# Patient Record
Sex: Female | Born: 1935 | Race: White | Hispanic: No | Smoking: Former smoker
Health system: Southern US, Community
[De-identification: ages and names within clinical notes are randomized; demographics above are authoritative.]

## PROBLEM LIST (undated history)

## (undated) DIAGNOSIS — M159 Polyosteoarthritis, unspecified: Secondary | ICD-10-CM

## (undated) DIAGNOSIS — Z85828 Personal history of other malignant neoplasm of skin: Secondary | ICD-10-CM

## (undated) DIAGNOSIS — I5189 Other ill-defined heart diseases: Secondary | ICD-10-CM

## (undated) DIAGNOSIS — M1990 Unspecified osteoarthritis, unspecified site: Secondary | ICD-10-CM

## (undated) DIAGNOSIS — N3941 Urge incontinence: Secondary | ICD-10-CM

## (undated) DIAGNOSIS — M810 Age-related osteoporosis without current pathological fracture: Secondary | ICD-10-CM

## (undated) DIAGNOSIS — IMO0001 Reserved for inherently not codable concepts without codable children: Secondary | ICD-10-CM

## (undated) DIAGNOSIS — E785 Hyperlipidemia, unspecified: Secondary | ICD-10-CM

## (undated) DIAGNOSIS — N3281 Overactive bladder: Secondary | ICD-10-CM

## (undated) DIAGNOSIS — Z889 Allergy status to unspecified drugs, medicaments and biological substances status: Secondary | ICD-10-CM

## (undated) DIAGNOSIS — J301 Allergic rhinitis due to pollen: Secondary | ICD-10-CM

## (undated) DIAGNOSIS — N898 Other specified noninflammatory disorders of vagina: Secondary | ICD-10-CM

## (undated) HISTORY — DX: Reserved for inherently not codable concepts without codable children: IMO0001

## (undated) HISTORY — DX: Age-related osteoporosis without current pathological fracture: M81.0

## (undated) HISTORY — DX: Other ill-defined heart diseases: I51.89

## (undated) HISTORY — DX: Allergic rhinitis due to pollen: J30.1

## (undated) HISTORY — DX: Hyperlipidemia, unspecified: E78.5

## (undated) HISTORY — DX: Other specified noninflammatory disorders of vagina: N89.8

## (undated) HISTORY — DX: Polyosteoarthritis, unspecified: M15.9

## (undated) HISTORY — DX: Urge incontinence: N39.41

## (undated) HISTORY — PX: TONSILLECTOMY AND ADENOIDECTOMY: SUR1326

## (undated) HISTORY — PX: DILATION AND CURETTAGE OF UTERUS: SHX78

---
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
## 1988-08-03 DIAGNOSIS — Z85828 Personal history of other malignant neoplasm of skin: Secondary | ICD-10-CM

## 1988-08-03 HISTORY — DX: Personal history of other malignant neoplasm of skin: Z85.828

## 1998-08-03 DIAGNOSIS — Z85828 Personal history of other malignant neoplasm of skin: Secondary | ICD-10-CM

## 1998-08-03 HISTORY — DX: Personal history of other malignant neoplasm of skin: Z85.828

## 2005-08-03 HISTORY — PX: CYSTOCELE REPAIR: SHX163

## 2006-02-04 HISTORY — PX: UMBILICAL HERNIA REPAIR: SHX196

## 2010-08-03 HISTORY — PX: INGUINAL HERNIA REPAIR: SUR1180

## 2010-12-02 DIAGNOSIS — IMO0001 Reserved for inherently not codable concepts without codable children: Secondary | ICD-10-CM

## 2010-12-02 HISTORY — DX: Reserved for inherently not codable concepts without codable children: IMO0001

## 2010-12-03 LAB — HM DEXA SCAN

## 2011-08-28 DIAGNOSIS — J209 Acute bronchitis, unspecified: Secondary | ICD-10-CM | POA: Diagnosis not present

## 2011-09-08 DIAGNOSIS — L570 Actinic keratosis: Secondary | ICD-10-CM | POA: Diagnosis not present

## 2011-09-08 DIAGNOSIS — Z85828 Personal history of other malignant neoplasm of skin: Secondary | ICD-10-CM | POA: Diagnosis not present

## 2011-10-06 DIAGNOSIS — L570 Actinic keratosis: Secondary | ICD-10-CM | POA: Diagnosis not present

## 2011-10-06 DIAGNOSIS — Z85828 Personal history of other malignant neoplasm of skin: Secondary | ICD-10-CM | POA: Diagnosis not present

## 2011-11-27 DIAGNOSIS — H1045 Other chronic allergic conjunctivitis: Secondary | ICD-10-CM | POA: Diagnosis not present

## 2011-11-27 DIAGNOSIS — R030 Elevated blood-pressure reading, without diagnosis of hypertension: Secondary | ICD-10-CM | POA: Diagnosis not present

## 2011-12-01 DIAGNOSIS — Z961 Presence of intraocular lens: Secondary | ICD-10-CM | POA: Diagnosis not present

## 2011-12-08 DIAGNOSIS — R1013 Epigastric pain: Secondary | ICD-10-CM | POA: Diagnosis not present

## 2011-12-08 DIAGNOSIS — K439 Ventral hernia without obstruction or gangrene: Secondary | ICD-10-CM | POA: Diagnosis not present

## 2012-01-05 DIAGNOSIS — D485 Neoplasm of uncertain behavior of skin: Secondary | ICD-10-CM | POA: Diagnosis not present

## 2012-02-08 DIAGNOSIS — E038 Other specified hypothyroidism: Secondary | ICD-10-CM | POA: Diagnosis not present

## 2012-02-08 DIAGNOSIS — E7800 Pure hypercholesterolemia, unspecified: Secondary | ICD-10-CM | POA: Diagnosis not present

## 2012-02-08 DIAGNOSIS — Z79899 Other long term (current) drug therapy: Secondary | ICD-10-CM | POA: Diagnosis not present

## 2012-02-08 DIAGNOSIS — E559 Vitamin D deficiency, unspecified: Secondary | ICD-10-CM | POA: Diagnosis not present

## 2012-02-15 DIAGNOSIS — Z01419 Encounter for gynecological examination (general) (routine) without abnormal findings: Secondary | ICD-10-CM | POA: Diagnosis not present

## 2012-02-15 DIAGNOSIS — E7800 Pure hypercholesterolemia, unspecified: Secondary | ICD-10-CM | POA: Diagnosis not present

## 2012-02-15 DIAGNOSIS — Z23 Encounter for immunization: Secondary | ICD-10-CM | POA: Diagnosis not present

## 2012-02-15 DIAGNOSIS — Z0000 Encounter for general adult medical examination without abnormal findings: Secondary | ICD-10-CM | POA: Diagnosis not present

## 2012-05-31 DIAGNOSIS — Z23 Encounter for immunization: Secondary | ICD-10-CM | POA: Diagnosis not present

## 2012-07-18 ENCOUNTER — Ambulatory Visit (INDEPENDENT_AMBULATORY_CARE_PROVIDER_SITE_OTHER): Payer: Medicare Other | Admitting: Internal Medicine

## 2012-07-18 ENCOUNTER — Encounter: Payer: Self-pay | Admitting: Internal Medicine

## 2012-07-18 VITALS — BP 142/70 | HR 86 | Temp 98.2°F | Ht 63.5 in | Wt 131.0 lb

## 2012-07-18 DIAGNOSIS — J301 Allergic rhinitis due to pollen: Secondary | ICD-10-CM | POA: Diagnosis not present

## 2012-07-18 DIAGNOSIS — E785 Hyperlipidemia, unspecified: Secondary | ICD-10-CM

## 2012-07-18 DIAGNOSIS — M159 Polyosteoarthritis, unspecified: Secondary | ICD-10-CM | POA: Insufficient documentation

## 2012-07-18 DIAGNOSIS — M810 Age-related osteoporosis without current pathological fracture: Secondary | ICD-10-CM | POA: Diagnosis not present

## 2012-07-18 DIAGNOSIS — N3941 Urge incontinence: Secondary | ICD-10-CM

## 2012-07-18 DIAGNOSIS — I5032 Chronic diastolic (congestive) heart failure: Secondary | ICD-10-CM | POA: Insufficient documentation

## 2012-07-18 NOTE — Assessment & Plan Note (Signed)
Discussed primary prevention No history of any vascular disease Will stop

## 2012-07-18 NOTE — Assessment & Plan Note (Signed)
She has been satisfied with celebrex No stomach issues Kidney tests normal earlier this year---records reviewed

## 2012-07-18 NOTE — Assessment & Plan Note (Signed)
Still with regular symptoms Advised against the benedryl she was using Asked her to use zyrtec and we can add more rx if needed

## 2012-07-18 NOTE — Assessment & Plan Note (Signed)
Not sure if detrol is helping Advised that she can try off it, and restart if clear change

## 2012-07-18 NOTE — Progress Notes (Signed)
Subjective:    Patient ID: [name] [name], female    DOB: 06/03/1924, 78 y.o.   MRN: [id number]  HPI Recently moved to [facility] Here with husband---I have been caring for him already  Long standing history of high cholesterol LDL about 97 last check on the med No history of CAD or stroke  Has urge incontinence and leakage Has to wear pads Did seem to help in the beginning No dry mouth, dizziness or memory problems  Arthritis fairly widespread Hands are the worst Occ will drop things and there is swelling celebrex seems to help that  Bone density showed osteoporosis in spine--not hip Had used for 5-10 years and then stopped for a couple of years Restarted last year  Has known LBBB  Current Outpatient Prescriptions on File Prior to Visit  Medication Sig Dispense Refill  . pravastatin (PRAVACHOL) 40 MG tablet Take 40 mg by mouth daily.        Allergies  Allergen Reactions  . Demerol (Meperidine)   . Shellfish Allergy     Past Medical History  Diagnosis Date  . Allergic rhinitis due to pollen     some shellfish also  . Osteoporosis   . Osteoarthritis, multiple sites   . Urge incontinence   . Hyperlipidemia     Past Surgical History  Procedure Date  . Inguinal hernia repair 2012    left side with mesh  . Umbilical hernia repair 2007  . Cystocele repair 2007    and rectocele  . Tonsillectomy and adenoidectomy     as child    Family History  Problem Relation Age of Onset  . Cancer Father 34    colon cancer  . Heart disease Father     History   Social History  . Marital Status: Married    Spouse Name: N/A    Number of Children: 3  . Years of Education: N/A   Occupational History  . 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 Smoking status: Never Smoker   . Smokeless tobacco: Never Used  . Alcohol Use: No  . Drug Use: No  . Sexually Active: Not on file   Other Topics Concern  . Not  on file   Social History Narrative   Has living willHusband then daughter Leta Jungling, is health care POAWould accept trial of resuscitationWould probably accept feeding tube   Review of Systems  Constitutional: Positive for unexpected weight change. Negative for fatigue.       Lost some weight with the move here and stress with husband's health Wears seat belt  HENT: Positive for hearing loss, congestion and rhinorrhea.        Just got new hearing aides---seem to be helping Own teeth---regular with dentist  Allergy problems---shots in past Uses OTC antihistamine just prn  Eyes: Negative for visual disturbance.       No diplopia or unilateral vision loss  Respiratory: Negative for cough and shortness of breath.   Cardiovascular: Negative for chest pain, palpitations and leg swelling.  Gastrointestinal: Negative for nausea, vomiting, constipation and blood in stool.       No heartburn  Genitourinary: Positive for difficulty urinating.       Urge incontinence  Musculoskeletal: Positive for arthralgias. Negative for joint swelling.  Skin: Negative for rash.       No suspicious lesions  Neurological: Negative for dizziness, syncope and light-headedness.  Occ headaches---relates to sinuses  Hematological: Negative for adenopathy. Does not bruise/bleed easily.  Psychiatric/Behavioral: Positive for sleep disturbance. Negative for dysphoric mood. The patient is not nervous/anxious.        Sleep is off some--nighttime awakening. 6-7 hours per night No sig daytime somnolence       Objective:   Physical Exam  Constitutional: She is oriented to person, place, and time. She appears well-developed and well-nourished. No distress.  HENT:  Mouth/Throat: Oropharynx is clear and moist. No oropharyngeal exudate.  Eyes: Conjunctivae normal and EOM are normal. Pupils are equal, round, and reactive to light.  Neck: Normal range of motion. Neck supple. No thyromegaly present.  Cardiovascular:  Normal rate, regular rhythm, normal heart sounds and intact distal pulses.  Exam reveals no gallop.   No murmur heard. Pulmonary/Chest: Effort normal and breath sounds normal. No respiratory distress. She has no wheezes. She has no rales.  Abdominal: Soft. There is no tenderness.  Musculoskeletal: She exhibits no edema and no tenderness.  Lymphadenopathy:    She has no cervical adenopathy.  Neurological: She is alert and oriented to person, place, and time.  Skin: No rash noted. No erythema.  Psychiatric: She has a normal mood and affect. Her behavior is normal.          Assessment & Plan:

## 2012-07-18 NOTE — Patient Instructions (Addendum)
Please stop the pravastatin. Try off the detrol. If your urine leakage gets worse, it is okay to go back on

## 2012-07-18 NOTE — Progress Notes (Signed)
  Subjective:    Patient ID: [name] [name], female    DOB: 06/03/1924, 78 y.o.   MRN: [id number]  HPI    Review of Systems     Objective:   Physical Exam  Cardiovascular:  Murmur heard.      Very soft systolic murmur in aortic area          Assessment & Plan:

## 2012-07-18 NOTE — Assessment & Plan Note (Signed)
Had long term Rx with fosamax then restarted after 2 year hiatus Will plan to recheck next year Spine T score -3.4 Wrist and hip -1.5 or less  If no worse, will probably stop again Continue the vitamin D  Discussed regular exercise

## 2012-10-13 DIAGNOSIS — H4010X0 Unspecified open-angle glaucoma, stage unspecified: Secondary | ICD-10-CM | POA: Diagnosis not present

## 2012-11-28 ENCOUNTER — Other Ambulatory Visit: Payer: Self-pay | Admitting: *Deleted

## 2012-11-28 ENCOUNTER — Telehealth: Payer: Self-pay

## 2012-11-28 MED ORDER — CELECOXIB 200 MG PO CAPS: 200.0000 mg | ORAL_CAPSULE | Freq: Every day | ORAL | Status: DC

## 2012-11-28 NOTE — Telephone Encounter (Signed)
Pt left v/m returning call for [facility], alendronate is 70 mg that pt request filled.Please advise.

## 2012-11-29 MED ORDER — ALENDRONATE SODIUM 70 MG PO TABS: 70.0000 mg | ORAL_TABLET | ORAL | Status: DC

## 2012-11-29 NOTE — Telephone Encounter (Signed)
rx sent to pharmacy by e-script  

## 2012-12-14 ENCOUNTER — Other Ambulatory Visit: Payer: Self-pay | Admitting: *Deleted

## 2012-12-14 MED ORDER — ALENDRONATE SODIUM 70 MG PO TABS: 70.0000 mg | ORAL_TABLET | ORAL | Status: DC

## 2012-12-14 MED ORDER — CELECOXIB 200 MG PO CAPS: 200.0000 mg | ORAL_CAPSULE | Freq: Every day | ORAL | Status: DC

## 2012-12-14 NOTE — Telephone Encounter (Signed)
[facility], rx sent to pharmacy by [facility]

## 2012-12-15 DIAGNOSIS — H1045 Other chronic allergic conjunctivitis: Secondary | ICD-10-CM | POA: Diagnosis not present

## 2012-12-15 DIAGNOSIS — J309 Allergic rhinitis, unspecified: Secondary | ICD-10-CM | POA: Diagnosis not present

## 2012-12-15 DIAGNOSIS — Z91013 Allergy to seafood: Secondary | ICD-10-CM | POA: Diagnosis not present

## 2012-12-16 DIAGNOSIS — J309 Allergic rhinitis, unspecified: Secondary | ICD-10-CM | POA: Diagnosis not present

## 2013-01-03 DIAGNOSIS — J309 Allergic rhinitis, unspecified: Secondary | ICD-10-CM | POA: Diagnosis not present

## 2013-01-05 DIAGNOSIS — J309 Allergic rhinitis, unspecified: Secondary | ICD-10-CM | POA: Diagnosis not present

## 2013-01-10 DIAGNOSIS — J309 Allergic rhinitis, unspecified: Secondary | ICD-10-CM | POA: Diagnosis not present

## 2013-01-12 DIAGNOSIS — J309 Allergic rhinitis, unspecified: Secondary | ICD-10-CM | POA: Diagnosis not present

## 2013-01-17 DIAGNOSIS — J309 Allergic rhinitis, unspecified: Secondary | ICD-10-CM | POA: Diagnosis not present

## 2013-01-19 DIAGNOSIS — J309 Allergic rhinitis, unspecified: Secondary | ICD-10-CM | POA: Diagnosis not present

## 2013-01-24 DIAGNOSIS — J309 Allergic rhinitis, unspecified: Secondary | ICD-10-CM | POA: Diagnosis not present

## 2013-01-26 DIAGNOSIS — J309 Allergic rhinitis, unspecified: Secondary | ICD-10-CM | POA: Diagnosis not present

## 2013-01-30 ENCOUNTER — Ambulatory Visit: Payer: TRICARE For Life (TFL) | Admitting: Internal Medicine

## 2013-01-31 DIAGNOSIS — J309 Allergic rhinitis, unspecified: Secondary | ICD-10-CM | POA: Diagnosis not present

## 2013-02-02 DIAGNOSIS — J309 Allergic rhinitis, unspecified: Secondary | ICD-10-CM | POA: Diagnosis not present

## 2013-02-06 ENCOUNTER — Encounter: Payer: Self-pay | Admitting: Internal Medicine

## 2013-02-06 ENCOUNTER — Ambulatory Visit (INDEPENDENT_AMBULATORY_CARE_PROVIDER_SITE_OTHER): Payer: Medicare Other | Admitting: Internal Medicine

## 2013-02-06 VITALS — BP 140/70 | HR 67 | Temp 98.4°F | Wt 127.0 lb

## 2013-02-06 DIAGNOSIS — Z23 Encounter for immunization: Secondary | ICD-10-CM

## 2013-02-06 DIAGNOSIS — M159 Polyosteoarthritis, unspecified: Secondary | ICD-10-CM | POA: Diagnosis not present

## 2013-02-06 DIAGNOSIS — M810 Age-related osteoporosis without current pathological fracture: Secondary | ICD-10-CM

## 2013-02-06 DIAGNOSIS — E785 Hyperlipidemia, unspecified: Secondary | ICD-10-CM | POA: Diagnosis not present

## 2013-02-06 DIAGNOSIS — N3941 Urge incontinence: Secondary | ICD-10-CM | POA: Diagnosis not present

## 2013-02-06 DIAGNOSIS — J301 Allergic rhinitis due to pollen: Secondary | ICD-10-CM

## 2013-02-06 LAB — CBC WITH DIFFERENTIAL/PLATELET
Basophils Absolute: 0 10*3/uL (ref 0.0–0.1)
Eosinophils Absolute: 0.2 10*3/uL (ref 0.0–0.7)
HCT: 37.5 % (ref 36.0–46.0)
Hemoglobin: 12.6 g/dL (ref 12.0–15.0)
Lymphocytes Relative: 46.2 % — ABNORMAL HIGH (ref 12.0–46.0)
Lymphs Abs: 4 10*3/uL (ref 0.7–4.0)
MCHC: 33.5 g/dL (ref 30.0–36.0)
Neutro Abs: 3.8 10*3/uL (ref 1.4–7.7)
Platelets: 291 10*3/uL (ref 150.0–400.0)
RDW: 14.2 % (ref 11.5–14.6)

## 2013-02-06 LAB — HEPATIC FUNCTION PANEL
Albumin: 3.8 g/dL (ref 3.5–5.2)
Total Protein: 6.5 g/dL (ref 6.0–8.3)

## 2013-02-06 LAB — BASIC METABOLIC PANEL
BUN: 22 mg/dL (ref 6–23)
CO2: 29 mEq/L (ref 19–32)
Calcium: 8.9 mg/dL (ref 8.4–10.5)
Glucose, Bld: 97 mg/dL (ref 70–99)
Sodium: 135 mEq/L (ref 135–145)

## 2013-02-06 LAB — LIPID PANEL: Total CHOL/HDL Ratio: 3

## 2013-02-06 LAB — TSH: TSH: 1.27 u[IU]/mL (ref 0.35–5.50)

## 2013-02-06 NOTE — Assessment & Plan Note (Signed)
Increased symptoms off the detrol--so back on

## 2013-02-06 NOTE — Assessment & Plan Note (Signed)
Now on immunotherapy with Dr [place] Callas

## 2013-02-06 NOTE — Progress Notes (Signed)
Subjective:    Patient ID: [name] [name], female    DOB: Feb 19, 1924, 79 y.o.   MRN: [id number]  HPI Here for follow up Doing well  Did have fall this [facility]  Tried off the detrol Noticed significant increased incontinence so restarted No dizziness, memory problems on it Slight but not bad dry mouth  Has ventral hernia on left side Relates to pushing husband in wheelchair  Off the statin Feels fine Reviewed our decision about primary prevention  Still on the celebrex Still feels it helps No upset stomach or heartburn  Bad allergies Went to [place] [facility]--now on immunotherapy Dr [place] Callas  Still on the fosamax  Current Outpatient Prescriptions on File Prior to Visit  Medication Sig Dispense Refill  . alendronate (FOSAMAX) 70 MG tablet Take 1 tablet (70 mg total) by mouth every 7 (seven) days. Take with a full glass of water on an empty stomach.  12 tablet  3  . aspirin 81 MG tablet Take 81 mg by mouth daily.      . celecoxib (CELEBREX) 200 MG capsule Take 1 capsule (200 mg total) by mouth daily.  90 capsule  3  . Cholecalciferol (VITAMIN D-3) 1000 UNITS CAPS Take by mouth daily.      . Lactobacillus (ACIDOPHILUS) 100 MG CAPS Take by mouth.      . Multiple Vitamin (MULTIVITAMIN) tablet Take 1 tablet by mouth daily.      Marland Kitchen tolterodine (DETROL LA) 4 MG 24 hr capsule Take 4 mg by mouth daily.       No current facility-administered medications on file prior to visit.    Allergies  Allergen Reactions  . Demerol (Meperidine)   . Shellfish Allergy     Past Medical History  Diagnosis Date  . Allergic rhinitis due to pollen     some shellfish also  . Osteoporosis   . Osteoarthritis, multiple sites   . Urge incontinence   . Hyperlipidemia   . Aortic sclerosis 5/12    On echo. no stenosis  . Diastolic dysfunction     stress echo otherwise normal 1/04. Repeat 5/11 also negative    Past Surgical History  Procedure Laterality  Date  . Inguinal hernia repair  2012    left side with mesh  . Umbilical hernia repair  2007  . Cystocele repair  2007    and rectocele  . Tonsillectomy and adenoidectomy      as child    Family History  Problem Relation Age of Onset  . Cancer Father 83    colon cancer  . Heart disease Father     History   Social History  . Marital Status: Married    Spouse Name: N/A    Number of Children: 3  . Years of Education: N/A   Occupational History  . 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 Smoking status: Never Smoker   . Smokeless tobacco: Never Used  . Alcohol Use: No  . Drug Use: No  . Sexually Active: Not on file   Other Topics Concern  . Not on file   Social History Narrative   Has living will   Husband then daughter Leta Jungling, is health care POA   Would accept trial of resuscitation   Would probably accept feeding tube   Review of Systems Sleeps well Appetite is fine Weight is down  a few pounds     Objective:   Physical Exam  Constitutional: She appears well-developed and well-nourished. No distress.  Neck: Normal range of motion. Neck supple. No thyromegaly present.  Cardiovascular: Normal rate, regular rhythm, normal heart sounds and intact distal pulses.  Exam reveals no gallop.   No murmur heard. Pulmonary/Chest: Effort normal and breath sounds normal. No respiratory distress. She has no wheezes. She has no rales.  Abdominal: Soft. There is no tenderness.  Musculoskeletal: She exhibits no edema and no tenderness.  Lymphadenopathy:    She has no cervical adenopathy.  Skin: No rash noted.  Psychiatric: She has a normal mood and affect. Her behavior is normal.          Assessment & Plan:

## 2013-02-06 NOTE — Addendum Note (Signed)
Addended by: Sueanne Margarita on: 02/06/2013 11:10 AM   Modules accepted: Orders

## 2013-02-06 NOTE — Assessment & Plan Note (Signed)
Satisfied with the celebrex Will check renal function

## 2013-02-06 NOTE — Assessment & Plan Note (Signed)
No Rx for primary prevention Will recheck labs

## 2013-02-06 NOTE — Assessment & Plan Note (Signed)
Check DEXA next year and consider stopping the fosamax again

## 2013-02-07 ENCOUNTER — Encounter: Payer: Self-pay | Admitting: Family Medicine

## 2013-02-07 DIAGNOSIS — J309 Allergic rhinitis, unspecified: Secondary | ICD-10-CM | POA: Diagnosis not present

## 2013-02-09 DIAGNOSIS — J309 Allergic rhinitis, unspecified: Secondary | ICD-10-CM | POA: Diagnosis not present

## 2013-02-14 DIAGNOSIS — J309 Allergic rhinitis, unspecified: Secondary | ICD-10-CM | POA: Diagnosis not present

## 2013-02-16 DIAGNOSIS — J309 Allergic rhinitis, unspecified: Secondary | ICD-10-CM | POA: Diagnosis not present

## 2013-02-21 DIAGNOSIS — J309 Allergic rhinitis, unspecified: Secondary | ICD-10-CM | POA: Diagnosis not present

## 2013-02-23 DIAGNOSIS — J309 Allergic rhinitis, unspecified: Secondary | ICD-10-CM | POA: Diagnosis not present

## 2013-02-28 DIAGNOSIS — J309 Allergic rhinitis, unspecified: Secondary | ICD-10-CM | POA: Diagnosis not present

## 2013-03-02 DIAGNOSIS — J309 Allergic rhinitis, unspecified: Secondary | ICD-10-CM | POA: Diagnosis not present

## 2013-03-06 ENCOUNTER — Other Ambulatory Visit: Payer: Self-pay | Admitting: *Deleted

## 2013-03-06 MED ORDER — TOLTERODINE TARTRATE ER 4 MG PO CP24: 4.0000 mg | ORAL_CAPSULE | Freq: Every day | ORAL | Status: DC

## 2013-03-07 DIAGNOSIS — J309 Allergic rhinitis, unspecified: Secondary | ICD-10-CM | POA: Diagnosis not present

## 2013-03-09 DIAGNOSIS — J309 Allergic rhinitis, unspecified: Secondary | ICD-10-CM | POA: Diagnosis not present

## 2013-03-14 DIAGNOSIS — J309 Allergic rhinitis, unspecified: Secondary | ICD-10-CM | POA: Diagnosis not present

## 2013-03-16 DIAGNOSIS — J309 Allergic rhinitis, unspecified: Secondary | ICD-10-CM | POA: Diagnosis not present

## 2013-03-21 DIAGNOSIS — J309 Allergic rhinitis, unspecified: Secondary | ICD-10-CM | POA: Diagnosis not present

## 2013-03-23 DIAGNOSIS — J309 Allergic rhinitis, unspecified: Secondary | ICD-10-CM | POA: Diagnosis not present

## 2013-03-28 DIAGNOSIS — J309 Allergic rhinitis, unspecified: Secondary | ICD-10-CM | POA: Diagnosis not present

## 2013-03-30 DIAGNOSIS — J309 Allergic rhinitis, unspecified: Secondary | ICD-10-CM | POA: Diagnosis not present

## 2013-04-04 DIAGNOSIS — J309 Allergic rhinitis, unspecified: Secondary | ICD-10-CM | POA: Diagnosis not present

## 2013-04-06 DIAGNOSIS — J309 Allergic rhinitis, unspecified: Secondary | ICD-10-CM | POA: Diagnosis not present

## 2013-04-11 DIAGNOSIS — J309 Allergic rhinitis, unspecified: Secondary | ICD-10-CM | POA: Diagnosis not present

## 2013-04-13 DIAGNOSIS — J309 Allergic rhinitis, unspecified: Secondary | ICD-10-CM | POA: Diagnosis not present

## 2013-04-17 DIAGNOSIS — Z85828 Personal history of other malignant neoplasm of skin: Secondary | ICD-10-CM | POA: Diagnosis not present

## 2013-04-17 DIAGNOSIS — L570 Actinic keratosis: Secondary | ICD-10-CM | POA: Diagnosis not present

## 2013-04-17 DIAGNOSIS — D239 Other benign neoplasm of skin, unspecified: Secondary | ICD-10-CM | POA: Diagnosis not present

## 2013-04-17 DIAGNOSIS — L821 Other seborrheic keratosis: Secondary | ICD-10-CM | POA: Diagnosis not present

## 2013-04-18 DIAGNOSIS — H4010X0 Unspecified open-angle glaucoma, stage unspecified: Secondary | ICD-10-CM | POA: Diagnosis not present

## 2013-04-18 DIAGNOSIS — J309 Allergic rhinitis, unspecified: Secondary | ICD-10-CM | POA: Diagnosis not present

## 2013-04-20 DIAGNOSIS — J309 Allergic rhinitis, unspecified: Secondary | ICD-10-CM | POA: Diagnosis not present

## 2013-04-25 DIAGNOSIS — J309 Allergic rhinitis, unspecified: Secondary | ICD-10-CM | POA: Diagnosis not present

## 2013-04-27 DIAGNOSIS — J309 Allergic rhinitis, unspecified: Secondary | ICD-10-CM | POA: Diagnosis not present

## 2013-05-02 DIAGNOSIS — H4010X0 Unspecified open-angle glaucoma, stage unspecified: Secondary | ICD-10-CM | POA: Diagnosis not present

## 2013-05-02 DIAGNOSIS — J309 Allergic rhinitis, unspecified: Secondary | ICD-10-CM | POA: Diagnosis not present

## 2013-05-09 DIAGNOSIS — J309 Allergic rhinitis, unspecified: Secondary | ICD-10-CM | POA: Diagnosis not present

## 2013-05-16 DIAGNOSIS — J309 Allergic rhinitis, unspecified: Secondary | ICD-10-CM | POA: Diagnosis not present

## 2013-05-16 DIAGNOSIS — H4010X0 Unspecified open-angle glaucoma, stage unspecified: Secondary | ICD-10-CM | POA: Diagnosis not present

## 2013-05-23 DIAGNOSIS — J309 Allergic rhinitis, unspecified: Secondary | ICD-10-CM | POA: Diagnosis not present

## 2013-05-25 ENCOUNTER — Ambulatory Visit (INDEPENDENT_AMBULATORY_CARE_PROVIDER_SITE_OTHER): Payer: Medicare Other

## 2013-05-25 DIAGNOSIS — Z23 Encounter for immunization: Secondary | ICD-10-CM

## 2013-05-30 DIAGNOSIS — J309 Allergic rhinitis, unspecified: Secondary | ICD-10-CM | POA: Diagnosis not present

## 2013-06-06 DIAGNOSIS — J309 Allergic rhinitis, unspecified: Secondary | ICD-10-CM | POA: Diagnosis not present

## 2013-06-08 DIAGNOSIS — J309 Allergic rhinitis, unspecified: Secondary | ICD-10-CM | POA: Diagnosis not present

## 2013-06-13 DIAGNOSIS — J309 Allergic rhinitis, unspecified: Secondary | ICD-10-CM | POA: Diagnosis not present

## 2013-06-20 DIAGNOSIS — J309 Allergic rhinitis, unspecified: Secondary | ICD-10-CM | POA: Diagnosis not present

## 2013-07-18 DIAGNOSIS — J309 Allergic rhinitis, unspecified: Secondary | ICD-10-CM | POA: Diagnosis not present

## 2013-07-25 DIAGNOSIS — J309 Allergic rhinitis, unspecified: Secondary | ICD-10-CM | POA: Diagnosis not present

## 2013-08-01 DIAGNOSIS — J309 Allergic rhinitis, unspecified: Secondary | ICD-10-CM | POA: Diagnosis not present

## 2013-08-08 DIAGNOSIS — J309 Allergic rhinitis, unspecified: Secondary | ICD-10-CM | POA: Diagnosis not present

## 2013-08-15 DIAGNOSIS — J309 Allergic rhinitis, unspecified: Secondary | ICD-10-CM | POA: Diagnosis not present

## 2013-08-22 DIAGNOSIS — J309 Allergic rhinitis, unspecified: Secondary | ICD-10-CM | POA: Diagnosis not present

## 2013-08-29 DIAGNOSIS — J309 Allergic rhinitis, unspecified: Secondary | ICD-10-CM | POA: Diagnosis not present

## 2013-09-05 DIAGNOSIS — J309 Allergic rhinitis, unspecified: Secondary | ICD-10-CM | POA: Diagnosis not present

## 2013-09-12 DIAGNOSIS — J309 Allergic rhinitis, unspecified: Secondary | ICD-10-CM | POA: Diagnosis not present

## 2013-09-19 DIAGNOSIS — J309 Allergic rhinitis, unspecified: Secondary | ICD-10-CM | POA: Diagnosis not present

## 2013-09-26 DIAGNOSIS — J309 Allergic rhinitis, unspecified: Secondary | ICD-10-CM | POA: Diagnosis not present

## 2013-10-03 DIAGNOSIS — J309 Allergic rhinitis, unspecified: Secondary | ICD-10-CM | POA: Diagnosis not present

## 2013-10-10 DIAGNOSIS — J309 Allergic rhinitis, unspecified: Secondary | ICD-10-CM | POA: Diagnosis not present

## 2013-10-13 ENCOUNTER — Other Ambulatory Visit: Payer: Self-pay | Admitting: Internal Medicine

## 2013-10-17 DIAGNOSIS — J309 Allergic rhinitis, unspecified: Secondary | ICD-10-CM | POA: Diagnosis not present

## 2013-10-24 DIAGNOSIS — J309 Allergic rhinitis, unspecified: Secondary | ICD-10-CM | POA: Diagnosis not present

## 2013-10-31 DIAGNOSIS — J309 Allergic rhinitis, unspecified: Secondary | ICD-10-CM | POA: Diagnosis not present

## 2013-11-01 ENCOUNTER — Other Ambulatory Visit: Payer: Self-pay | Admitting: Internal Medicine

## 2013-11-06 DIAGNOSIS — J309 Allergic rhinitis, unspecified: Secondary | ICD-10-CM | POA: Diagnosis not present

## 2013-11-07 DIAGNOSIS — J309 Allergic rhinitis, unspecified: Secondary | ICD-10-CM | POA: Diagnosis not present

## 2013-11-14 DIAGNOSIS — J309 Allergic rhinitis, unspecified: Secondary | ICD-10-CM | POA: Diagnosis not present

## 2013-11-14 DIAGNOSIS — H4010X0 Unspecified open-angle glaucoma, stage unspecified: Secondary | ICD-10-CM | POA: Diagnosis not present

## 2013-11-21 DIAGNOSIS — J309 Allergic rhinitis, unspecified: Secondary | ICD-10-CM | POA: Diagnosis not present

## 2013-11-28 DIAGNOSIS — J309 Allergic rhinitis, unspecified: Secondary | ICD-10-CM | POA: Diagnosis not present

## 2013-12-05 DIAGNOSIS — J309 Allergic rhinitis, unspecified: Secondary | ICD-10-CM | POA: Diagnosis not present

## 2013-12-12 DIAGNOSIS — J309 Allergic rhinitis, unspecified: Secondary | ICD-10-CM | POA: Diagnosis not present

## 2013-12-19 DIAGNOSIS — J309 Allergic rhinitis, unspecified: Secondary | ICD-10-CM | POA: Diagnosis not present

## 2013-12-21 DIAGNOSIS — J3089 Other allergic rhinitis: Secondary | ICD-10-CM | POA: Diagnosis not present

## 2013-12-21 DIAGNOSIS — H1045 Other chronic allergic conjunctivitis: Secondary | ICD-10-CM | POA: Diagnosis not present

## 2013-12-21 DIAGNOSIS — Z91013 Allergy to seafood: Secondary | ICD-10-CM | POA: Diagnosis not present

## 2013-12-21 DIAGNOSIS — J301 Allergic rhinitis due to pollen: Secondary | ICD-10-CM | POA: Diagnosis not present

## 2013-12-26 DIAGNOSIS — J309 Allergic rhinitis, unspecified: Secondary | ICD-10-CM | POA: Diagnosis not present

## 2014-01-02 DIAGNOSIS — J309 Allergic rhinitis, unspecified: Secondary | ICD-10-CM | POA: Diagnosis not present

## 2014-01-09 DIAGNOSIS — J309 Allergic rhinitis, unspecified: Secondary | ICD-10-CM | POA: Diagnosis not present

## 2014-01-16 DIAGNOSIS — J309 Allergic rhinitis, unspecified: Secondary | ICD-10-CM | POA: Diagnosis not present

## 2014-01-18 DIAGNOSIS — J309 Allergic rhinitis, unspecified: Secondary | ICD-10-CM | POA: Diagnosis not present

## 2014-01-23 ENCOUNTER — Other Ambulatory Visit: Payer: Self-pay | Admitting: Internal Medicine

## 2014-01-23 DIAGNOSIS — J309 Allergic rhinitis, unspecified: Secondary | ICD-10-CM | POA: Diagnosis not present

## 2014-01-30 DIAGNOSIS — L821 Other seborrheic keratosis: Secondary | ICD-10-CM | POA: Diagnosis not present

## 2014-01-30 DIAGNOSIS — J309 Allergic rhinitis, unspecified: Secondary | ICD-10-CM | POA: Diagnosis not present

## 2014-01-30 DIAGNOSIS — L820 Inflamed seborrheic keratosis: Secondary | ICD-10-CM | POA: Diagnosis not present

## 2014-01-30 DIAGNOSIS — L578 Other skin changes due to chronic exposure to nonionizing radiation: Secondary | ICD-10-CM | POA: Diagnosis not present

## 2014-01-30 DIAGNOSIS — L719 Rosacea, unspecified: Secondary | ICD-10-CM | POA: Diagnosis not present

## 2014-02-06 DIAGNOSIS — J309 Allergic rhinitis, unspecified: Secondary | ICD-10-CM | POA: Diagnosis not present

## 2014-02-07 ENCOUNTER — Ambulatory Visit (INDEPENDENT_AMBULATORY_CARE_PROVIDER_SITE_OTHER): Payer: Medicare Other | Admitting: Internal Medicine

## 2014-02-07 ENCOUNTER — Encounter: Payer: Self-pay | Admitting: Internal Medicine

## 2014-02-07 VITALS — BP 128/70 | HR 62 | Temp 97.8°F | Ht 63.5 in | Wt 127.0 lb

## 2014-02-07 DIAGNOSIS — Z0000 Encounter for general adult medical examination without abnormal findings: Secondary | ICD-10-CM | POA: Insufficient documentation

## 2014-02-07 DIAGNOSIS — Z7189 Other specified counseling: Secondary | ICD-10-CM | POA: Diagnosis not present

## 2014-02-07 DIAGNOSIS — M159 Polyosteoarthritis, unspecified: Secondary | ICD-10-CM | POA: Diagnosis not present

## 2014-02-07 DIAGNOSIS — I519 Heart disease, unspecified: Secondary | ICD-10-CM | POA: Diagnosis not present

## 2014-02-07 DIAGNOSIS — Z23 Encounter for immunization: Secondary | ICD-10-CM | POA: Diagnosis not present

## 2014-02-07 DIAGNOSIS — E348 Other specified endocrine disorders: Secondary | ICD-10-CM | POA: Diagnosis not present

## 2014-02-07 DIAGNOSIS — M150 Primary generalized (osteo)arthritis: Secondary | ICD-10-CM

## 2014-02-07 DIAGNOSIS — M810 Age-related osteoporosis without current pathological fracture: Secondary | ICD-10-CM | POA: Diagnosis not present

## 2014-02-07 DIAGNOSIS — I5189 Other ill-defined heart diseases: Secondary | ICD-10-CM

## 2014-02-07 DIAGNOSIS — N3941 Urge incontinence: Secondary | ICD-10-CM

## 2014-02-07 LAB — COMPREHENSIVE METABOLIC PANEL
ALT: 14 U/L (ref 0–35)
AST: 26 U/L (ref 0–37)
Albumin: 3.9 g/dL (ref 3.5–5.2)
Alkaline Phosphatase: 61 U/L (ref 39–117)
BILIRUBIN TOTAL: 0.6 mg/dL (ref 0.2–1.2)
BUN: 14 mg/dL (ref 6–23)
CHLORIDE: 98 meq/L (ref 96–112)
CO2: 32 meq/L (ref 19–32)
CREATININE: 0.7 mg/dL (ref 0.4–1.2)
Calcium: 9.4 mg/dL (ref 8.4–10.5)
GFR: 91.46 mL/min (ref >60.00)
Glucose, Bld: 101 mg/dL — ABNORMAL HIGH (ref 70–99)
Potassium: 4.4 mEq/L (ref 3.5–5.1)
SODIUM: 133 meq/L — AB (ref 135–145)
TOTAL PROTEIN: 7 g/dL (ref 6.0–8.3)

## 2014-02-07 LAB — CBC WITH DIFFERENTIAL/PLATELET
BASOS ABS: 0.1 10*3/uL (ref 0.0–0.1)
Basophils Relative: 0.8 % (ref 0.0–3.0)
Eosinophils Absolute: 0.2 10*3/uL (ref 0.0–0.7)
Eosinophils Relative: 2.5 % (ref 0.0–5.0)
HCT: 38 % (ref 36.0–46.0)
Hemoglobin: 13 g/dL (ref 12.0–15.0)
Lymphocytes Relative: 58.1 % — ABNORMAL HIGH (ref 12.0–46.0)
Lymphs Abs: 4.9 10*3/uL — ABNORMAL HIGH (ref 0.7–4.0)
MCHC: 34.2 g/dL (ref 30.0–36.0)
MCV: 93.3 fl (ref 78.0–100.0)
MONO ABS: 0.5 10*3/uL (ref 0.1–1.0)
MONOS PCT: 5.4 % (ref 3.0–12.0)
NEUTROS ABS: 2.8 10*3/uL (ref 1.4–7.7)
Neutrophils Relative %: 33.2 % — ABNORMAL LOW (ref 43.0–77.0)
PLATELETS: 278 10*3/uL (ref 150.0–400.0)
RBC: 4.08 Mil/uL (ref 3.87–5.11)
RDW: 13.9 % (ref 11.5–15.5)
WBC: 8.5 10*3/uL (ref 4.0–10.5)

## 2014-02-07 LAB — T4, FREE: FREE T4: 0.8 ng/dL (ref 0.60–1.60)

## 2014-02-07 MED ORDER — ALENDRONATE SODIUM 70 MG PO TABS: 70.0000 mg | ORAL_TABLET | ORAL | Status: DC

## 2014-02-07 NOTE — Assessment & Plan Note (Signed)
No evident symptoms Keeps active

## 2014-02-07 NOTE — Assessment & Plan Note (Signed)
See social history 

## 2014-02-07 NOTE — Progress Notes (Signed)
Subjective:    Patient ID: [name] [name], female    DOB: 12/14/1923, 80 y.o.   MRN: [id number]  HPI Here for Medicare wellness and follow up Reviewed form and advanced directives Reviewed other doctors---derm and ophtho Poor hearing--has aides. Glaucoma but controlled with drops Independent with instrumental ADLs--- actually has to care for husband No cognitive changes No falls No depression or anhedonia  Ran out of fosamax---has been on it over 5 years Regular exercise at fitness center Still on vitamin D  No chest pain  No SOB No dizziness or syncope No edema  Still on celebrex for arthritis More trouble with hands--less strength No GI problems No blood in stools but they tend to be dark  Still on detrol Satisfied that it helps her incontinence Still has some leakage--- urinary and fecal (uses liners)  Current Outpatient Prescriptions on File Prior to Visit  Medication Sig Dispense Refill  . aspirin 81 MG tablet Take 81 mg by mouth daily.      . celecoxib (CELEBREX) 200 MG capsule TAKE 1 CAPSULE DAILY  90 capsule  2  . Cholecalciferol (VITAMIN D-3) 1000 UNITS CAPS Take by mouth daily.      Marland Kitchen DETROL LA 4 MG 24 hr capsule TAKE 1 CAPSULE DAILY  90 capsule  2  . Lactobacillus (ACIDOPHILUS) 100 MG CAPS Take by mouth.      . Multiple Vitamin (MULTIVITAMIN) tablet Take 1 tablet by mouth daily.       No current facility-administered medications on file prior to visit.    Allergies  Allergen Reactions  . Demerol [Meperidine]   . Shellfish Allergy     Past Medical History  Diagnosis Date  . Allergic rhinitis due to pollen     some shellfish also  . Osteoporosis   . Osteoarthritis, multiple sites   . Urge incontinence   . Hyperlipidemia   . Aortic sclerosis 5/12    On echo. no stenosis  . Diastolic dysfunction     stress echo otherwise normal 1/04. Repeat 5/11 also negative    Past Surgical History  Procedure Laterality Date  . Inguinal hernia repair   2012    left side with mesh  . Umbilical hernia repair  2007  . Cystocele repair  2007    and rectocele  . Tonsillectomy and adenoidectomy      as child    Family History  Problem Relation Age of Onset  . Cancer Father 41    colon cancer  . Heart disease Father     History   Social History  . Marital Status: Married    Spouse Name: N/A    Number of Children: 3  . Years of Education: N/A   Occupational History  . 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 Smoking status: Never Smoker   . Smokeless tobacco: Never Used  . Alcohol Use: No  . Drug Use: No  . Sexual Activity: Not on file   Other Topics Concern  . Not on file   Social History Narrative   Has living will   Husband then daughter [name], is health care POA   Would accept trial of resuscitation   Would probably accept feeding tube   Review of Systems Sleeps okay Appetite fine---weight stable Bowels are okay Keeps up with derm-- gets Rx for facial lesions. Now has sore by right ear (?from hearing  aide)    Objective:   Physical Exam  Constitutional: She is oriented to person, place, and time. She appears well-developed and well-nourished. No distress.  HENT:  Mouth/Throat: Oropharynx is clear and moist. No oropharyngeal exudate.  Neck: Normal range of motion. Neck supple.  Cardiovascular: Normal rate, regular rhythm, normal heart sounds and intact distal pulses.  Exam reveals no gallop.   No murmur heard. Pulmonary/Chest: Effort normal and breath sounds normal. No respiratory distress. She has no wheezes. She has no rales.  Abdominal: Soft. She exhibits no distension. There is no tenderness. There is no rebound and no guarding.  Easily reducible left ventral hernia  Musculoskeletal: She exhibits no edema and no tenderness.  Lymphadenopathy:    She has no cervical adenopathy.  Neurological: She is alert and oriented to person, place, and time.    President -- "Obama, Bush, ?" [phone] [id number] Recall 3/3  Skin: No rash noted.  Slight scaly area inside right ear  Psychiatric: She has a normal mood and affect. Her behavior is normal.          Assessment & Plan:

## 2014-02-07 NOTE — Progress Notes (Signed)
Pre visit review using our clinic review tool, if applicable. No additional management support is needed unless otherwise documented below in the visit note. 

## 2014-02-07 NOTE — Assessment & Plan Note (Signed)
Satisfied with celebrex Will check renal and CBC Discussed PPI--will hold off due to osteoporosis

## 2014-02-07 NOTE — Assessment & Plan Note (Signed)
Satisfied with the med for this

## 2014-02-07 NOTE — Assessment & Plan Note (Signed)
I have personally reviewed the Medicare Annual Wellness questionnaire and have noted 1. The patient's medical and social history 2. Their use of alcohol, tobacco or illicit drugs 3. Their current medications and supplements 4. The patient's functional ability including ADL's, fall risks, home safety risks and hearing or visual             impairment. 5. Diet and physical activities 6. Evidence for depression or mood disorders  The patients weight, height, BMI and visual acuity have been recorded in the chart I have made referrals, counseling and provided education to the patient based review of the above and I have provided the pt with a written personalized care plan for preventive services.  I have provided you with a copy of your personalized plan for preventive services. Please take the time to review along with your updated medication list.  No cancer screening  Will give prevnar Discussed zostavax--she will hold off

## 2014-02-07 NOTE — Addendum Note (Signed)
Addended by: Despina Hidden on: 02/07/2014 01:28 PM   Modules accepted: Orders

## 2014-02-07 NOTE — Assessment & Plan Note (Signed)
On alendronate for over 5 years so will stop Exercise and vitamin D

## 2014-02-09 ENCOUNTER — Encounter: Payer: Self-pay | Admitting: *Deleted

## 2014-02-13 DIAGNOSIS — J309 Allergic rhinitis, unspecified: Secondary | ICD-10-CM | POA: Diagnosis not present

## 2014-02-20 DIAGNOSIS — J309 Allergic rhinitis, unspecified: Secondary | ICD-10-CM | POA: Diagnosis not present

## 2014-02-27 DIAGNOSIS — J309 Allergic rhinitis, unspecified: Secondary | ICD-10-CM | POA: Diagnosis not present

## 2014-03-06 DIAGNOSIS — J309 Allergic rhinitis, unspecified: Secondary | ICD-10-CM | POA: Diagnosis not present

## 2014-03-13 DIAGNOSIS — L089 Local infection of the skin and subcutaneous tissue, unspecified: Secondary | ICD-10-CM | POA: Diagnosis not present

## 2014-03-13 DIAGNOSIS — L259 Unspecified contact dermatitis, unspecified cause: Secondary | ICD-10-CM | POA: Diagnosis not present

## 2014-03-13 DIAGNOSIS — Z85828 Personal history of other malignant neoplasm of skin: Secondary | ICD-10-CM | POA: Diagnosis not present

## 2014-03-13 DIAGNOSIS — J309 Allergic rhinitis, unspecified: Secondary | ICD-10-CM | POA: Diagnosis not present

## 2014-03-13 DIAGNOSIS — L820 Inflamed seborrheic keratosis: Secondary | ICD-10-CM | POA: Diagnosis not present

## 2014-03-13 DIAGNOSIS — L719 Rosacea, unspecified: Secondary | ICD-10-CM | POA: Diagnosis not present

## 2014-03-20 DIAGNOSIS — J309 Allergic rhinitis, unspecified: Secondary | ICD-10-CM | POA: Diagnosis not present

## 2014-03-27 DIAGNOSIS — J309 Allergic rhinitis, unspecified: Secondary | ICD-10-CM | POA: Diagnosis not present

## 2014-04-03 DIAGNOSIS — J309 Allergic rhinitis, unspecified: Secondary | ICD-10-CM | POA: Diagnosis not present

## 2014-04-10 DIAGNOSIS — J309 Allergic rhinitis, unspecified: Secondary | ICD-10-CM | POA: Diagnosis not present

## 2014-04-17 DIAGNOSIS — D239 Other benign neoplasm of skin, unspecified: Secondary | ICD-10-CM | POA: Diagnosis not present

## 2014-04-17 DIAGNOSIS — L570 Actinic keratosis: Secondary | ICD-10-CM | POA: Diagnosis not present

## 2014-04-17 DIAGNOSIS — Z1283 Encounter for screening for malignant neoplasm of skin: Secondary | ICD-10-CM | POA: Diagnosis not present

## 2014-04-17 DIAGNOSIS — L821 Other seborrheic keratosis: Secondary | ICD-10-CM | POA: Diagnosis not present

## 2014-04-17 DIAGNOSIS — J309 Allergic rhinitis, unspecified: Secondary | ICD-10-CM | POA: Diagnosis not present

## 2014-04-17 DIAGNOSIS — L820 Inflamed seborrheic keratosis: Secondary | ICD-10-CM | POA: Diagnosis not present

## 2014-04-17 DIAGNOSIS — L578 Other skin changes due to chronic exposure to nonionizing radiation: Secondary | ICD-10-CM | POA: Diagnosis not present

## 2014-04-17 DIAGNOSIS — L719 Rosacea, unspecified: Secondary | ICD-10-CM | POA: Diagnosis not present

## 2014-04-17 DIAGNOSIS — Z85828 Personal history of other malignant neoplasm of skin: Secondary | ICD-10-CM | POA: Diagnosis not present

## 2014-04-23 DIAGNOSIS — J309 Allergic rhinitis, unspecified: Secondary | ICD-10-CM | POA: Diagnosis not present

## 2014-04-24 ENCOUNTER — Ambulatory Visit: Payer: Medicare Other

## 2014-04-24 DIAGNOSIS — J309 Allergic rhinitis, unspecified: Secondary | ICD-10-CM | POA: Diagnosis not present

## 2014-05-01 DIAGNOSIS — J309 Allergic rhinitis, unspecified: Secondary | ICD-10-CM | POA: Diagnosis not present

## 2014-05-08 DIAGNOSIS — J3089 Other allergic rhinitis: Secondary | ICD-10-CM | POA: Diagnosis not present

## 2014-05-08 DIAGNOSIS — J3081 Allergic rhinitis due to animal (cat) (dog) hair and dander: Secondary | ICD-10-CM | POA: Diagnosis not present

## 2014-05-08 DIAGNOSIS — J301 Allergic rhinitis due to pollen: Secondary | ICD-10-CM | POA: Diagnosis not present

## 2014-05-15 DIAGNOSIS — J3081 Allergic rhinitis due to animal (cat) (dog) hair and dander: Secondary | ICD-10-CM | POA: Diagnosis not present

## 2014-05-15 DIAGNOSIS — J301 Allergic rhinitis due to pollen: Secondary | ICD-10-CM | POA: Diagnosis not present

## 2014-05-15 DIAGNOSIS — H4010X0 Unspecified open-angle glaucoma, stage unspecified: Secondary | ICD-10-CM | POA: Diagnosis not present

## 2014-05-15 DIAGNOSIS — J3089 Other allergic rhinitis: Secondary | ICD-10-CM | POA: Diagnosis not present

## 2014-05-21 DIAGNOSIS — H4010X2 Unspecified open-angle glaucoma, moderate stage: Secondary | ICD-10-CM | POA: Diagnosis not present

## 2014-05-22 DIAGNOSIS — J301 Allergic rhinitis due to pollen: Secondary | ICD-10-CM | POA: Diagnosis not present

## 2014-05-22 DIAGNOSIS — J3089 Other allergic rhinitis: Secondary | ICD-10-CM | POA: Diagnosis not present

## 2014-05-22 DIAGNOSIS — J3081 Allergic rhinitis due to animal (cat) (dog) hair and dander: Secondary | ICD-10-CM | POA: Diagnosis not present

## 2014-05-29 DIAGNOSIS — J3081 Allergic rhinitis due to animal (cat) (dog) hair and dander: Secondary | ICD-10-CM | POA: Diagnosis not present

## 2014-05-29 DIAGNOSIS — J301 Allergic rhinitis due to pollen: Secondary | ICD-10-CM | POA: Diagnosis not present

## 2014-05-29 DIAGNOSIS — J3089 Other allergic rhinitis: Secondary | ICD-10-CM | POA: Diagnosis not present

## 2014-06-05 ENCOUNTER — Other Ambulatory Visit: Payer: Self-pay | Admitting: Internal Medicine

## 2014-06-05 DIAGNOSIS — J301 Allergic rhinitis due to pollen: Secondary | ICD-10-CM | POA: Diagnosis not present

## 2014-06-05 DIAGNOSIS — J3081 Allergic rhinitis due to animal (cat) (dog) hair and dander: Secondary | ICD-10-CM | POA: Diagnosis not present

## 2014-06-05 DIAGNOSIS — J3089 Other allergic rhinitis: Secondary | ICD-10-CM | POA: Diagnosis not present

## 2014-06-12 DIAGNOSIS — J3081 Allergic rhinitis due to animal (cat) (dog) hair and dander: Secondary | ICD-10-CM | POA: Diagnosis not present

## 2014-06-12 DIAGNOSIS — J3089 Other allergic rhinitis: Secondary | ICD-10-CM | POA: Diagnosis not present

## 2014-06-12 DIAGNOSIS — J301 Allergic rhinitis due to pollen: Secondary | ICD-10-CM | POA: Diagnosis not present

## 2014-06-19 DIAGNOSIS — J3081 Allergic rhinitis due to animal (cat) (dog) hair and dander: Secondary | ICD-10-CM | POA: Diagnosis not present

## 2014-06-19 DIAGNOSIS — J3089 Other allergic rhinitis: Secondary | ICD-10-CM | POA: Diagnosis not present

## 2014-06-19 DIAGNOSIS — J301 Allergic rhinitis due to pollen: Secondary | ICD-10-CM | POA: Diagnosis not present

## 2014-06-26 DIAGNOSIS — J3089 Other allergic rhinitis: Secondary | ICD-10-CM | POA: Diagnosis not present

## 2014-06-26 DIAGNOSIS — J301 Allergic rhinitis due to pollen: Secondary | ICD-10-CM | POA: Diagnosis not present

## 2014-06-26 DIAGNOSIS — J3081 Allergic rhinitis due to animal (cat) (dog) hair and dander: Secondary | ICD-10-CM | POA: Diagnosis not present

## 2014-07-03 DIAGNOSIS — J3081 Allergic rhinitis due to animal (cat) (dog) hair and dander: Secondary | ICD-10-CM | POA: Diagnosis not present

## 2014-07-03 DIAGNOSIS — J301 Allergic rhinitis due to pollen: Secondary | ICD-10-CM | POA: Diagnosis not present

## 2014-07-03 DIAGNOSIS — J3089 Other allergic rhinitis: Secondary | ICD-10-CM | POA: Diagnosis not present

## 2014-07-10 DIAGNOSIS — J3089 Other allergic rhinitis: Secondary | ICD-10-CM | POA: Diagnosis not present

## 2014-07-10 DIAGNOSIS — J3081 Allergic rhinitis due to animal (cat) (dog) hair and dander: Secondary | ICD-10-CM | POA: Diagnosis not present

## 2014-07-10 DIAGNOSIS — J301 Allergic rhinitis due to pollen: Secondary | ICD-10-CM | POA: Diagnosis not present

## 2014-07-12 DIAGNOSIS — H4011X1 Primary open-angle glaucoma, mild stage: Secondary | ICD-10-CM | POA: Diagnosis not present

## 2014-07-17 DIAGNOSIS — J3081 Allergic rhinitis due to animal (cat) (dog) hair and dander: Secondary | ICD-10-CM | POA: Diagnosis not present

## 2014-07-17 DIAGNOSIS — J301 Allergic rhinitis due to pollen: Secondary | ICD-10-CM | POA: Diagnosis not present

## 2014-07-17 DIAGNOSIS — J3089 Other allergic rhinitis: Secondary | ICD-10-CM | POA: Diagnosis not present

## 2014-07-24 DIAGNOSIS — J301 Allergic rhinitis due to pollen: Secondary | ICD-10-CM | POA: Diagnosis not present

## 2014-07-24 DIAGNOSIS — J3081 Allergic rhinitis due to animal (cat) (dog) hair and dander: Secondary | ICD-10-CM | POA: Diagnosis not present

## 2014-07-24 DIAGNOSIS — J3089 Other allergic rhinitis: Secondary | ICD-10-CM | POA: Diagnosis not present

## 2014-07-31 DIAGNOSIS — J3089 Other allergic rhinitis: Secondary | ICD-10-CM | POA: Diagnosis not present

## 2014-07-31 DIAGNOSIS — J301 Allergic rhinitis due to pollen: Secondary | ICD-10-CM | POA: Diagnosis not present

## 2014-07-31 DIAGNOSIS — J3081 Allergic rhinitis due to animal (cat) (dog) hair and dander: Secondary | ICD-10-CM | POA: Diagnosis not present

## 2014-08-07 DIAGNOSIS — J3081 Allergic rhinitis due to animal (cat) (dog) hair and dander: Secondary | ICD-10-CM | POA: Diagnosis not present

## 2014-08-07 DIAGNOSIS — J301 Allergic rhinitis due to pollen: Secondary | ICD-10-CM | POA: Diagnosis not present

## 2014-08-07 DIAGNOSIS — J3089 Other allergic rhinitis: Secondary | ICD-10-CM | POA: Diagnosis not present

## 2014-08-14 DIAGNOSIS — J3081 Allergic rhinitis due to animal (cat) (dog) hair and dander: Secondary | ICD-10-CM | POA: Diagnosis not present

## 2014-08-14 DIAGNOSIS — J3089 Other allergic rhinitis: Secondary | ICD-10-CM | POA: Diagnosis not present

## 2014-08-14 DIAGNOSIS — J301 Allergic rhinitis due to pollen: Secondary | ICD-10-CM | POA: Diagnosis not present

## 2014-08-20 DIAGNOSIS — J301 Allergic rhinitis due to pollen: Secondary | ICD-10-CM | POA: Diagnosis not present

## 2014-08-21 DIAGNOSIS — J3081 Allergic rhinitis due to animal (cat) (dog) hair and dander: Secondary | ICD-10-CM | POA: Diagnosis not present

## 2014-08-21 DIAGNOSIS — J3089 Other allergic rhinitis: Secondary | ICD-10-CM | POA: Diagnosis not present

## 2014-08-21 DIAGNOSIS — J301 Allergic rhinitis due to pollen: Secondary | ICD-10-CM | POA: Diagnosis not present

## 2014-08-28 DIAGNOSIS — J301 Allergic rhinitis due to pollen: Secondary | ICD-10-CM | POA: Diagnosis not present

## 2014-08-28 DIAGNOSIS — J3089 Other allergic rhinitis: Secondary | ICD-10-CM | POA: Diagnosis not present

## 2014-08-28 DIAGNOSIS — J3081 Allergic rhinitis due to animal (cat) (dog) hair and dander: Secondary | ICD-10-CM | POA: Diagnosis not present

## 2014-09-02 ENCOUNTER — Other Ambulatory Visit: Payer: Self-pay | Admitting: Internal Medicine

## 2014-09-04 DIAGNOSIS — J3089 Other allergic rhinitis: Secondary | ICD-10-CM | POA: Diagnosis not present

## 2014-09-04 DIAGNOSIS — J3081 Allergic rhinitis due to animal (cat) (dog) hair and dander: Secondary | ICD-10-CM | POA: Diagnosis not present

## 2014-09-04 DIAGNOSIS — J301 Allergic rhinitis due to pollen: Secondary | ICD-10-CM | POA: Diagnosis not present

## 2014-09-11 DIAGNOSIS — J301 Allergic rhinitis due to pollen: Secondary | ICD-10-CM | POA: Diagnosis not present

## 2014-09-11 DIAGNOSIS — J3089 Other allergic rhinitis: Secondary | ICD-10-CM | POA: Diagnosis not present

## 2014-09-11 DIAGNOSIS — J3081 Allergic rhinitis due to animal (cat) (dog) hair and dander: Secondary | ICD-10-CM | POA: Diagnosis not present

## 2014-09-13 DIAGNOSIS — H4011X1 Primary open-angle glaucoma, mild stage: Secondary | ICD-10-CM | POA: Diagnosis not present

## 2014-09-18 DIAGNOSIS — J301 Allergic rhinitis due to pollen: Secondary | ICD-10-CM | POA: Diagnosis not present

## 2014-09-18 DIAGNOSIS — J3089 Other allergic rhinitis: Secondary | ICD-10-CM | POA: Diagnosis not present

## 2014-09-18 DIAGNOSIS — J3081 Allergic rhinitis due to animal (cat) (dog) hair and dander: Secondary | ICD-10-CM | POA: Diagnosis not present

## 2014-09-25 DIAGNOSIS — J3081 Allergic rhinitis due to animal (cat) (dog) hair and dander: Secondary | ICD-10-CM | POA: Diagnosis not present

## 2014-09-25 DIAGNOSIS — J301 Allergic rhinitis due to pollen: Secondary | ICD-10-CM | POA: Diagnosis not present

## 2014-09-25 DIAGNOSIS — J3089 Other allergic rhinitis: Secondary | ICD-10-CM | POA: Diagnosis not present

## 2014-10-02 DIAGNOSIS — J3081 Allergic rhinitis due to animal (cat) (dog) hair and dander: Secondary | ICD-10-CM | POA: Diagnosis not present

## 2014-10-02 DIAGNOSIS — J301 Allergic rhinitis due to pollen: Secondary | ICD-10-CM | POA: Diagnosis not present

## 2014-10-02 DIAGNOSIS — J3089 Other allergic rhinitis: Secondary | ICD-10-CM | POA: Diagnosis not present

## 2014-10-09 DIAGNOSIS — J3089 Other allergic rhinitis: Secondary | ICD-10-CM | POA: Diagnosis not present

## 2014-10-09 DIAGNOSIS — J301 Allergic rhinitis due to pollen: Secondary | ICD-10-CM | POA: Diagnosis not present

## 2014-10-09 DIAGNOSIS — J3081 Allergic rhinitis due to animal (cat) (dog) hair and dander: Secondary | ICD-10-CM | POA: Diagnosis not present

## 2014-10-16 DIAGNOSIS — J3089 Other allergic rhinitis: Secondary | ICD-10-CM | POA: Diagnosis not present

## 2014-10-16 DIAGNOSIS — J3081 Allergic rhinitis due to animal (cat) (dog) hair and dander: Secondary | ICD-10-CM | POA: Diagnosis not present

## 2014-10-16 DIAGNOSIS — J301 Allergic rhinitis due to pollen: Secondary | ICD-10-CM | POA: Diagnosis not present

## 2014-10-23 DIAGNOSIS — J3089 Other allergic rhinitis: Secondary | ICD-10-CM | POA: Diagnosis not present

## 2014-10-23 DIAGNOSIS — J301 Allergic rhinitis due to pollen: Secondary | ICD-10-CM | POA: Diagnosis not present

## 2014-10-23 DIAGNOSIS — J3081 Allergic rhinitis due to animal (cat) (dog) hair and dander: Secondary | ICD-10-CM | POA: Diagnosis not present

## 2014-10-25 ENCOUNTER — Other Ambulatory Visit: Payer: Self-pay | Admitting: Internal Medicine

## 2014-10-30 DIAGNOSIS — J3081 Allergic rhinitis due to animal (cat) (dog) hair and dander: Secondary | ICD-10-CM | POA: Diagnosis not present

## 2014-10-30 DIAGNOSIS — J301 Allergic rhinitis due to pollen: Secondary | ICD-10-CM | POA: Diagnosis not present

## 2014-10-30 DIAGNOSIS — J3089 Other allergic rhinitis: Secondary | ICD-10-CM | POA: Diagnosis not present

## 2014-11-06 ENCOUNTER — Telehealth: Payer: Self-pay | Admitting: Internal Medicine

## 2014-11-06 DIAGNOSIS — J301 Allergic rhinitis due to pollen: Secondary | ICD-10-CM | POA: Diagnosis not present

## 2014-11-06 DIAGNOSIS — J3081 Allergic rhinitis due to animal (cat) (dog) hair and dander: Secondary | ICD-10-CM | POA: Diagnosis not present

## 2014-11-06 DIAGNOSIS — J3089 Other allergic rhinitis: Secondary | ICD-10-CM | POA: Diagnosis not present

## 2014-11-06 NOTE — Telephone Encounter (Signed)
[facility]  Patient Name: [name] [name]  DOB: 1923/12/02    Initial Comment Caller states she is having pain in her groin area.   Nurse Assessment  Nurse: Justine Null, RN, Rodena Piety Date/Time Eilene Ghazi Time): 11/06/2014 4:08:01 PM  Confirm and document reason for call. If symptomatic, describe symptoms. ---Caller states she is having pain in her groin area. on the right side an has been started about for the past month and has been having on and off and has increased in severity and frequency and has pain rated at 9/10 and occurs when she is moving around and is on and off and has no injury  Has the patient traveled out of the country within the last 30 days? ---No  Does the patient require triage? ---Yes  Related visit to physician within the last 2 weeks? ---No  Does the PT have any chronic conditions? (i.e. diabetes, asthma, etc.) ---No     Guidelines    Guideline Title Affirmed Question Affirmed Notes  Leg Pain [1] SEVERE pain (e.g., excruciating, unable to do any normal activities) AND [2] not improved after 2 hours of pain medicine pain rated at 9/10   Final Disposition User   See Physician within 4 Hours (or PCP triage) Justine Null, RN, Rodena Piety    Comments  call placed to the office back line at [phone] and spoke with Morey Hummingbird and then transferred to the triage nurse Rollene Fare and informed her of the patient status and the request to come to the office to be seen nd the nurse stated that her MD was not in the office and that they did not have an appointment

## 2014-11-06 NOTE — Telephone Encounter (Signed)
Noted, routed to PCP. Thanks.  

## 2014-11-06 NOTE — Telephone Encounter (Signed)
Called and spoke to patient and was advised that she does not feel that the pain is bad enough to go to the ER and she has to take care of her husband. Patient stated that this has been going on for about a month off and on. Patient stated that she thinks that she may need some physical therapy, but feels that she needs to see Dr. Silvio Pate in order for him to order that. Patient stated that she talked with the nurse at [facility] and the nurse told her to take Tylenol and use heat and she plans on doing that now. Patient scheduled for an appointment tomorrow with Dr. Silvio Pate at 12:30.

## 2014-11-07 ENCOUNTER — Encounter: Payer: Self-pay | Admitting: Internal Medicine

## 2014-11-07 ENCOUNTER — Ambulatory Visit (INDEPENDENT_AMBULATORY_CARE_PROVIDER_SITE_OTHER): Payer: Medicare Other | Admitting: Internal Medicine

## 2014-11-07 ENCOUNTER — Ambulatory Visit (INDEPENDENT_AMBULATORY_CARE_PROVIDER_SITE_OTHER)
Admission: RE | Admit: 2014-11-07 | Discharge: 2014-11-07 | Disposition: A | Discharge status: Home / Self Care | Payer: Medicare Other | Source: Ambulatory Visit | Attending: Internal Medicine | Admitting: Internal Medicine

## 2014-11-07 VITALS — BP 128/80 | HR 71 | Temp 98.1°F | Ht 63.5 in | Wt 129.5 lb

## 2014-11-07 DIAGNOSIS — M1611 Unilateral primary osteoarthritis, right hip: Secondary | ICD-10-CM | POA: Diagnosis not present

## 2014-11-07 DIAGNOSIS — R1030 Lower abdominal pain, unspecified: Secondary | ICD-10-CM | POA: Diagnosis not present

## 2014-11-07 DIAGNOSIS — R1031 Right lower quadrant pain: Secondary | ICD-10-CM

## 2014-11-07 MED ORDER — TRAMADOL HCL 50 MG PO TABS: 50.0000 mg | ORAL_TABLET | Freq: Three times a day (TID) | ORAL | Status: DC | PRN

## 2014-11-07 NOTE — Progress Notes (Signed)
Subjective:    Patient ID: [name] [name], female    DOB: 1923/08/11, 81 y.o.   MRN: [id number]  HPI Here for evaluation of groin pain This goes back for a while but it was "the worst pain" yesterday  Started around noon and progressively worsened Nearly fell Used husband's walker Very sharp--along medial inguinal ligament on right Did radiated down medial thigh and slightly in back  Did try lying down and heat on the groin area---did ease up some Some pain today but nothing like yesterday Tylenol was only medication  No bulging like a hernia  Current Outpatient Prescriptions on File Prior to Visit  Medication Sig Dispense Refill  . aspirin 81 MG tablet Take 81 mg by mouth daily.    . celecoxib (CELEBREX) 200 MG capsule TAKE 1 CAPSULE DAILY 90 capsule 3  . Cholecalciferol (VITAMIN D-3) 1000 UNITS CAPS Take by mouth daily.    Marland Kitchen DETROL LA 4 MG 24 hr capsule TAKE 1 CAPSULE DAILY 90 capsule 1  . Lactobacillus (ACIDOPHILUS) 100 MG CAPS Take by mouth.    . Multiple Vitamin (MULTIVITAMIN) tablet Take 1 tablet by mouth daily.     No current facility-administered medications on file prior to visit.    Allergies  Allergen Reactions  . Demerol [Meperidine]   . Shellfish Allergy     Past Medical History  Diagnosis Date  . Allergic rhinitis due to pollen     some shellfish also  . Osteoporosis   . Osteoarthritis, multiple sites   . Urge incontinence   . Hyperlipidemia   . Aortic sclerosis 5/12    On echo. no stenosis  . Diastolic dysfunction     stress echo otherwise normal 1/04. Repeat 5/11 also negative    Past Surgical History  Procedure Laterality Date  . Inguinal hernia repair  2012    left side with mesh  . Umbilical hernia repair  2007  . Cystocele repair  2007    and rectocele  . Tonsillectomy and adenoidectomy      as child    Family History  Problem Relation Age of Onset  . Cancer Father 55    colon cancer  . Heart disease Father     History    Social History  . Marital Status: Married    Spouse Name: N/A  . Number of Children: 3  . Years of Education: N/A   Occupational History  . 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 Smoking status: Never Smoker   . Smokeless tobacco: Never Used  . Alcohol Use: No  . Drug Use: No  . Sexual Activity: Not on file   Other Topics Concern  . Not on file   Social History Narrative   Has living will   Husband then daughter [name], is health care POA   Would accept trial of resuscitation   Would probably accept feeding tube   Review of Systems Chronically has slightly weaker right leg Occasional backache at times No loss of bladder or bowel control No vaginal prolapse or discharge    Objective:   Physical Exam  Constitutional: She appears well-developed and well-nourished. No distress.  Genitourinary:  No apparent inguinal or femoral hernia. No tenderness on the right  Musculoskeletal:  No spine tenderness Fair back flexion-- close to 90 degrees Pain recreated with right internal hip rotation  Neurological:  Slow gait but full  weight bearing          Assessment & Plan:

## 2014-11-07 NOTE — Telephone Encounter (Signed)
Will see at [facility] today then

## 2014-11-07 NOTE — Progress Notes (Signed)
Pre visit review using our clinic review tool, if applicable. No additional management support is needed unless otherwise documented below in the visit note. 

## 2014-11-07 NOTE — Assessment & Plan Note (Signed)
I suspect this is from hip arthritis based on her exam Will check x-ray She cannot get away for surgery due to caregiving for her husband On celebrex already Will add tid tyelnol arthritis Tramadol for severe pain Okay to use heat

## 2014-11-09 DIAGNOSIS — J3081 Allergic rhinitis due to animal (cat) (dog) hair and dander: Secondary | ICD-10-CM | POA: Diagnosis not present

## 2014-11-09 DIAGNOSIS — J3089 Other allergic rhinitis: Secondary | ICD-10-CM | POA: Diagnosis not present

## 2014-11-13 DIAGNOSIS — J3081 Allergic rhinitis due to animal (cat) (dog) hair and dander: Secondary | ICD-10-CM | POA: Diagnosis not present

## 2014-11-13 DIAGNOSIS — J3089 Other allergic rhinitis: Secondary | ICD-10-CM | POA: Diagnosis not present

## 2014-11-13 DIAGNOSIS — J301 Allergic rhinitis due to pollen: Secondary | ICD-10-CM | POA: Diagnosis not present

## 2014-11-20 DIAGNOSIS — J3089 Other allergic rhinitis: Secondary | ICD-10-CM | POA: Diagnosis not present

## 2014-11-20 DIAGNOSIS — J3081 Allergic rhinitis due to animal (cat) (dog) hair and dander: Secondary | ICD-10-CM | POA: Diagnosis not present

## 2014-11-20 DIAGNOSIS — J301 Allergic rhinitis due to pollen: Secondary | ICD-10-CM | POA: Diagnosis not present

## 2014-11-27 DIAGNOSIS — J301 Allergic rhinitis due to pollen: Secondary | ICD-10-CM | POA: Diagnosis not present

## 2014-11-27 DIAGNOSIS — J3089 Other allergic rhinitis: Secondary | ICD-10-CM | POA: Diagnosis not present

## 2014-11-27 DIAGNOSIS — J3081 Allergic rhinitis due to animal (cat) (dog) hair and dander: Secondary | ICD-10-CM | POA: Diagnosis not present

## 2014-12-03 ENCOUNTER — Other Ambulatory Visit: Payer: Self-pay | Admitting: Internal Medicine

## 2014-12-04 DIAGNOSIS — J3081 Allergic rhinitis due to animal (cat) (dog) hair and dander: Secondary | ICD-10-CM | POA: Diagnosis not present

## 2014-12-04 DIAGNOSIS — J301 Allergic rhinitis due to pollen: Secondary | ICD-10-CM | POA: Diagnosis not present

## 2014-12-04 DIAGNOSIS — J3089 Other allergic rhinitis: Secondary | ICD-10-CM | POA: Diagnosis not present

## 2014-12-11 DIAGNOSIS — H1045 Other chronic allergic conjunctivitis: Secondary | ICD-10-CM | POA: Diagnosis not present

## 2014-12-11 DIAGNOSIS — J3081 Allergic rhinitis due to animal (cat) (dog) hair and dander: Secondary | ICD-10-CM | POA: Diagnosis not present

## 2014-12-11 DIAGNOSIS — J301 Allergic rhinitis due to pollen: Secondary | ICD-10-CM | POA: Diagnosis not present

## 2014-12-11 DIAGNOSIS — J3089 Other allergic rhinitis: Secondary | ICD-10-CM | POA: Diagnosis not present

## 2014-12-18 DIAGNOSIS — J301 Allergic rhinitis due to pollen: Secondary | ICD-10-CM | POA: Diagnosis not present

## 2014-12-18 DIAGNOSIS — J3089 Other allergic rhinitis: Secondary | ICD-10-CM | POA: Diagnosis not present

## 2014-12-18 DIAGNOSIS — J3081 Allergic rhinitis due to animal (cat) (dog) hair and dander: Secondary | ICD-10-CM | POA: Diagnosis not present

## 2014-12-25 DIAGNOSIS — J301 Allergic rhinitis due to pollen: Secondary | ICD-10-CM | POA: Diagnosis not present

## 2014-12-25 DIAGNOSIS — J3089 Other allergic rhinitis: Secondary | ICD-10-CM | POA: Diagnosis not present

## 2014-12-25 DIAGNOSIS — J3081 Allergic rhinitis due to animal (cat) (dog) hair and dander: Secondary | ICD-10-CM | POA: Diagnosis not present

## 2015-01-01 DIAGNOSIS — J301 Allergic rhinitis due to pollen: Secondary | ICD-10-CM | POA: Diagnosis not present

## 2015-01-01 DIAGNOSIS — J3089 Other allergic rhinitis: Secondary | ICD-10-CM | POA: Diagnosis not present

## 2015-01-01 DIAGNOSIS — J3081 Allergic rhinitis due to animal (cat) (dog) hair and dander: Secondary | ICD-10-CM | POA: Diagnosis not present

## 2015-01-08 DIAGNOSIS — J3081 Allergic rhinitis due to animal (cat) (dog) hair and dander: Secondary | ICD-10-CM | POA: Diagnosis not present

## 2015-01-08 DIAGNOSIS — J301 Allergic rhinitis due to pollen: Secondary | ICD-10-CM | POA: Diagnosis not present

## 2015-01-08 DIAGNOSIS — J3089 Other allergic rhinitis: Secondary | ICD-10-CM | POA: Diagnosis not present

## 2015-01-09 ENCOUNTER — Telehealth: Payer: Self-pay | Admitting: Internal Medicine

## 2015-01-15 ENCOUNTER — Encounter: Payer: Self-pay | Admitting: Internal Medicine

## 2015-01-15 ENCOUNTER — Ambulatory Visit (INDEPENDENT_AMBULATORY_CARE_PROVIDER_SITE_OTHER): Payer: Medicare Other | Admitting: Internal Medicine

## 2015-01-15 VITALS — BP 148/60 | HR 78 | Temp 98.6°F | Wt 129.0 lb

## 2015-01-15 DIAGNOSIS — K625 Hemorrhage of anus and rectum: Secondary | ICD-10-CM | POA: Diagnosis not present

## 2015-01-15 DIAGNOSIS — J3081 Allergic rhinitis due to animal (cat) (dog) hair and dander: Secondary | ICD-10-CM | POA: Diagnosis not present

## 2015-01-15 DIAGNOSIS — J3089 Other allergic rhinitis: Secondary | ICD-10-CM | POA: Diagnosis not present

## 2015-01-15 DIAGNOSIS — J301 Allergic rhinitis due to pollen: Secondary | ICD-10-CM | POA: Diagnosis not present

## 2015-01-15 MED ORDER — CELECOXIB 200 MG PO CAPS: 200.0000 mg | ORAL_CAPSULE | Freq: Every day | ORAL | Status: DC

## 2015-01-15 MED ORDER — HYDROCORTISONE 2.5 % EX CREA: TOPICAL_CREAM | Freq: Three times a day (TID) | CUTANEOUS | Status: DC | PRN

## 2015-01-15 NOTE — Progress Notes (Signed)
Subjective:    Patient ID: [name] [name], female    DOB: 06-08-1924, 81 y.o.   MRN: [id number]  HPI Here due to rectal bleeding  Trouble with bowels for a long time Sees some blood in the stools at times--usually after being constipated for a while Just red blood on paper Will have some days where she has to go 5 times a day--not formed Dark brown stool lately  Problems for 2 months mostly Doesn't use any meds---but will occasionally use stool softener Most she goes is 3 days without a stool  Last colonoscopy 2011---had a fairly large polyp  Current Outpatient Prescriptions on File Prior to Visit  Medication Sig Dispense Refill  . acetaminophen (TYLENOL) 650 MG CR tablet Take 650 mg by mouth 3 (three) times daily.    Marland Kitchen alendronate (FOSAMAX) 70 MG tablet TAKE 1 TABLET EVERY 7 DAYS, TAKE WITH FULL GLASS OF WATER ON AN EMPTY STOMACH 12 tablet 3  . aspirin 81 MG tablet Take 81 mg by mouth daily.    . Cholecalciferol (VITAMIN D-3) 1000 UNITS CAPS Take by mouth daily.    Marland Kitchen DETROL LA 4 MG 24 hr capsule TAKE 1 CAPSULE DAILY 90 capsule 1  . Lactobacillus (ACIDOPHILUS) 100 MG CAPS Take by mouth.    . Multiple Vitamin (MULTIVITAMIN) tablet Take 1 tablet by mouth daily.    . traMADol (ULTRAM) 50 MG tablet Take 1 tablet (50 mg total) by mouth 3 (three) times daily as needed. 90 tablet 0   No current facility-administered medications on file prior to visit.    Allergies  Allergen Reactions  . Demerol [Meperidine]   . Shellfish Allergy     Past Medical History  Diagnosis Date  . Allergic rhinitis due to pollen     some shellfish also  . Osteoporosis   . Osteoarthritis, multiple sites   . Urge incontinence   . Hyperlipidemia   . Aortic sclerosis 5/12    On echo. no stenosis  . Diastolic dysfunction     stress echo otherwise normal 1/04. Repeat 5/11 also negative    Past Surgical History  Procedure Laterality Date  . Inguinal hernia repair  2012    left side with mesh  .  Umbilical hernia repair  2007  . Cystocele repair  2007    and rectocele  . Tonsillectomy and adenoidectomy      as child    Family History  Problem Relation Age of Onset  . Cancer Father 63    colon cancer  . Heart disease Father     History   Social History  . Marital Status: Married    Spouse Name: N/A  . Number of Children: 3  . Years of Education: N/A   Occupational History  . 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 Smoking status: Never Smoker   . Smokeless tobacco: Never Used  . Alcohol Use: No  . Drug Use: No  . Sexual Activity: Not on file   Other Topics Concern  . Not on file   Social History Narrative   Has living will   Husband then daughter [name], is health care POA   Would accept trial of resuscitation   Would probably accept feeding tube   Review of Systems  Appetite is fine Only gets some abdominal pain if she hasn't gone in a few days No nausea or vomiting  Weight is stable--down just a slight bit     Objective:   Physical Exam  Constitutional: She appears well-developed and well-nourished. No distress.  Abdominal: Soft. Bowel sounds are normal. She exhibits no distension. There is no tenderness. There is no rebound and no guarding.  Irregular shaped mass in LUQ-- superficial and feels like scarring?  Genitourinary:  Slightly prolapsing small hemorrhoid and inflammation externally by it. No stool in vault and no masses. Secretions heme negative          Assessment & Plan:

## 2015-01-15 NOTE — Assessment & Plan Note (Addendum)
Seems like a local source---not colonic No blood in stool No systemic symptoms Discussed Rx for constipation Cortisone cream for   Mass in in abdominal wall--- probably mesh from umbilical hernia repair

## 2015-01-15 NOTE — Patient Instructions (Signed)
Please try miralax 1 capful in a glass of water every day or every other day to stay regular. Try the prescription cream up to three times a day to calm down the inflammation. If the bleeding persists, I can set you up with a gastroenterologist.

## 2015-01-15 NOTE — Progress Notes (Signed)
Pre visit review using our clinic review tool, if applicable. No additional management support is needed unless otherwise documented below in the visit note. 

## 2015-01-22 DIAGNOSIS — J3089 Other allergic rhinitis: Secondary | ICD-10-CM | POA: Diagnosis not present

## 2015-01-22 DIAGNOSIS — J3081 Allergic rhinitis due to animal (cat) (dog) hair and dander: Secondary | ICD-10-CM | POA: Diagnosis not present

## 2015-01-22 DIAGNOSIS — J301 Allergic rhinitis due to pollen: Secondary | ICD-10-CM | POA: Diagnosis not present

## 2015-01-29 DIAGNOSIS — J3089 Other allergic rhinitis: Secondary | ICD-10-CM | POA: Diagnosis not present

## 2015-01-29 DIAGNOSIS — J301 Allergic rhinitis due to pollen: Secondary | ICD-10-CM | POA: Diagnosis not present

## 2015-02-05 DIAGNOSIS — J3081 Allergic rhinitis due to animal (cat) (dog) hair and dander: Secondary | ICD-10-CM | POA: Diagnosis not present

## 2015-02-05 DIAGNOSIS — J301 Allergic rhinitis due to pollen: Secondary | ICD-10-CM | POA: Diagnosis not present

## 2015-02-05 DIAGNOSIS — J3089 Other allergic rhinitis: Secondary | ICD-10-CM | POA: Diagnosis not present

## 2015-02-08 DIAGNOSIS — J301 Allergic rhinitis due to pollen: Secondary | ICD-10-CM | POA: Diagnosis not present

## 2015-02-10 ENCOUNTER — Other Ambulatory Visit: Payer: Self-pay | Admitting: Internal Medicine

## 2015-02-12 DIAGNOSIS — J301 Allergic rhinitis due to pollen: Secondary | ICD-10-CM | POA: Diagnosis not present

## 2015-02-12 DIAGNOSIS — J3089 Other allergic rhinitis: Secondary | ICD-10-CM | POA: Diagnosis not present

## 2015-02-12 DIAGNOSIS — J3081 Allergic rhinitis due to animal (cat) (dog) hair and dander: Secondary | ICD-10-CM | POA: Diagnosis not present

## 2015-02-13 ENCOUNTER — Ambulatory Visit (INDEPENDENT_AMBULATORY_CARE_PROVIDER_SITE_OTHER): Payer: Medicare Other | Admitting: Internal Medicine

## 2015-02-13 ENCOUNTER — Encounter: Payer: Self-pay | Admitting: Internal Medicine

## 2015-02-13 VITALS — BP 140/68 | HR 80 | Temp 97.5°F | Ht 64.0 in | Wt 129.0 lb

## 2015-02-13 DIAGNOSIS — M810 Age-related osteoporosis without current pathological fracture: Secondary | ICD-10-CM | POA: Diagnosis not present

## 2015-02-13 DIAGNOSIS — Z7189 Other specified counseling: Secondary | ICD-10-CM

## 2015-02-13 DIAGNOSIS — I519 Heart disease, unspecified: Secondary | ICD-10-CM | POA: Diagnosis not present

## 2015-02-13 DIAGNOSIS — I5189 Other ill-defined heart diseases: Secondary | ICD-10-CM

## 2015-02-13 DIAGNOSIS — Z0000 Encounter for general adult medical examination without abnormal findings: Secondary | ICD-10-CM | POA: Diagnosis not present

## 2015-02-13 DIAGNOSIS — M150 Primary generalized (osteo)arthritis: Secondary | ICD-10-CM | POA: Diagnosis not present

## 2015-02-13 DIAGNOSIS — M159 Polyosteoarthritis, unspecified: Secondary | ICD-10-CM

## 2015-02-13 DIAGNOSIS — N3941 Urge incontinence: Secondary | ICD-10-CM | POA: Diagnosis not present

## 2015-02-13 LAB — CBC WITH DIFFERENTIAL/PLATELET
BASOS ABS: 0 10*3/uL (ref 0.0–0.1)
Basophils Relative: 0.4 % (ref 0.0–3.0)
EOS PCT: 1.9 % (ref 0.0–5.0)
Eosinophils Absolute: 0.2 10*3/uL (ref 0.0–0.7)
HCT: 36.8 % (ref 36.0–46.0)
Hemoglobin: 12.4 g/dL (ref 12.0–15.0)
LYMPHS ABS: 4.9 10*3/uL — AB (ref 0.7–4.0)
LYMPHS PCT: 53.9 % — AB (ref 12.0–46.0)
MCHC: 33.5 g/dL (ref 30.0–36.0)
MCV: 93.8 fl (ref 78.0–100.0)
MONO ABS: 0.5 10*3/uL (ref 0.1–1.0)
MONOS PCT: 6 % (ref 3.0–12.0)
NEUTROS PCT: 37.8 % — AB (ref 43.0–77.0)
Neutro Abs: 3.4 10*3/uL (ref 1.4–7.7)
Platelets: 271 10*3/uL (ref 150.0–400.0)
RBC: 3.93 Mil/uL (ref 3.87–5.11)
RDW: 13.8 % (ref 11.5–15.5)
WBC: 9 10*3/uL (ref 4.0–10.5)

## 2015-02-13 LAB — COMPREHENSIVE METABOLIC PANEL
ALT: 14 U/L (ref 0–35)
AST: 23 U/L (ref 0–37)
Albumin: 3.6 g/dL (ref 3.5–5.2)
Alkaline Phosphatase: 66 U/L (ref 39–117)
BUN: 20 mg/dL (ref 6–23)
CALCIUM: 9.3 mg/dL (ref 8.4–10.5)
CHLORIDE: 100 meq/L (ref 96–112)
CO2: 28 mEq/L (ref 19–32)
CREATININE: 0.74 mg/dL (ref 0.40–1.20)
GFR: 78.57 mL/min (ref >60.00)
Glucose, Bld: 91 mg/dL (ref 70–99)
POTASSIUM: 4.6 meq/L (ref 3.5–5.1)
SODIUM: 133 meq/L — AB (ref 135–145)
Total Bilirubin: 0.4 mg/dL (ref 0.2–1.2)
Total Protein: 6.8 g/dL (ref 6.0–8.3)

## 2015-02-13 MED ORDER — TRAMADOL HCL 50 MG PO TABS: 50.0000 mg | ORAL_TABLET | Freq: Three times a day (TID) | ORAL | Status: DC | PRN

## 2015-02-13 NOTE — Assessment & Plan Note (Signed)
Discussed trying off the med to make sure it is really helping

## 2015-02-13 NOTE — Assessment & Plan Note (Signed)
On stress echo Normal fluid status No symptoms

## 2015-02-13 NOTE — Assessment & Plan Note (Signed)
I have personally reviewed the Medicare Annual Wellness questionnaire and have noted 1. The patient's medical and social history 2. Their use of alcohol, tobacco or illicit drugs 3. Their current medications and supplements 4. The patient's functional ability including ADL's, fall risks, home safety risks and hearing or visual             impairment. 5. Diet and physical activities 6. Evidence for depression or mood disorders  The patients weight, height, BMI and visual acuity have been recorded in the chart I have made referrals, counseling and provided education to the patient based review of the above and I have provided the pt with a written personalized care plan for preventive services.  I have provided you with a copy of your personalized plan for preventive services. Please take the time to review along with your updated medication list.  Doesn't want zostavax Yearly flu vaccine No cancer screening due to age Living healthy lifestyle

## 2015-02-13 NOTE — Assessment & Plan Note (Signed)
Asked her to stop the bisphosphonate Had for over 5 years On vitamin D and regular weight bearing exercise Hip T scores only borderline abnormal

## 2015-02-13 NOTE — Assessment & Plan Note (Signed)
See social history 

## 2015-02-13 NOTE — Progress Notes (Signed)
Subjective:    Patient ID: [name] [name], female    DOB: 1923-11-02, 81 y.o.   MRN: [id number]  HPI Here for Medicare wellness and follow up of chronic medical conditions Reviewed form and advanced directives Reviewed other doctors 1 drink before dinner usually No tobacco Exercises regularly Did have 2 falls--mild injuries. No depression or anhedonia Independent with instrumental ADLs Vision is fine Mild hearing problems---aides help No major memory issues  Bowels still strange Blood mostly gone Some fecal incontinence--does wear protective liners Has cut back miralax to prn Eats a lot of fruit and fiber  Still with bad arthritis pain celebrex and tylenol may help some Doesn't remember trying the tramadol  Restarted the fosamax --pharmacy refilled Had already finished 5 years DEXA in 2012 showed T -3.4 in lumbar spine. But hips -1.1 and -1.5 Still on vitamin D and regularly exercises  No chest pain No SOB No change in exercise tolerance No edema No dizziness or syncope  Still on detrol Helps prevent urinary symptoms  Current Outpatient Prescriptions on File Prior to Visit  Medication Sig Dispense Refill  . acetaminophen (TYLENOL) 650 MG CR tablet Take 650 mg by mouth 3 (three) times daily.    Marland Kitchen alendronate (FOSAMAX) 70 MG tablet TAKE 1 TABLET EVERY 7 DAYS, TAKE WITH FULL GLASS OF WATER ON AN EMPTY STOMACH 12 tablet 3  . aspirin 81 MG tablet Take 81 mg by mouth daily.    . celecoxib (CELEBREX) 200 MG capsule Take 1 capsule (200 mg total) by mouth daily. 90 capsule 3  . Cholecalciferol (VITAMIN D-3) 1000 UNITS CAPS Take by mouth daily.    Marland Kitchen DETROL LA 4 MG 24 hr capsule TAKE 1 CAPSULE DAILY 90 capsule 0  . hydrocortisone 2.5 % cream Apply topically 3 (three) times daily as needed. 28 g 3  . Lactobacillus (ACIDOPHILUS) 100 MG CAPS Take by mouth.    . Multiple Vitamin (MULTIVITAMIN) tablet Take 1 tablet by mouth daily.    . traMADol (ULTRAM) 50 MG tablet Take 1  tablet (50 mg total) by mouth 3 (three) times daily as needed. 90 tablet 0   No current facility-administered medications on file prior to visit.    Allergies  Allergen Reactions  . Demerol [Meperidine]   . Shellfish Allergy     Past Medical History  Diagnosis Date  . Allergic rhinitis due to pollen     some shellfish also  . Osteoporosis   . Osteoarthritis, multiple sites   . Urge incontinence   . Hyperlipidemia   . Aortic sclerosis 5/12    On echo. no stenosis  . Diastolic dysfunction     stress echo otherwise normal 1/04. Repeat 5/11 also negative    Past Surgical History  Procedure Laterality Date  . Inguinal hernia repair  2012    left side with mesh  . Umbilical hernia repair  02/04/06  . Cystocele repair  2007    and rectocele  . Tonsillectomy and adenoidectomy      as child    Family History  Problem Relation Age of Onset  . Cancer Father 26    colon cancer  . Heart disease Father     History   Social History  . Marital Status: Married    Spouse Name: N/A  . Number of Children: 3  . Years of Education: N/A   Occupational History  . 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 Smoking status: Never Smoker   . Smokeless tobacco: Never Used  . Alcohol Use: No  . Drug Use: No  . Sexual Activity: Not on file   Other Topics Concern  . Not on file   Social History Narrative   Has living will   Husband then daughter [name], is health care POA   Would accept trial of resuscitation   Would probably accept feeding tube   Review of Systems Weight stable Appetite is good Sleeps well Teeth okay---keeps up with dentist Wears seat belt No rash or suspicious lesions. Some breaking out on face--?from strawberries    Objective:   Physical Exam  Constitutional: She is oriented to person, place, and time. She appears well-developed and well-nourished. No distress.  HENT:  Mouth/Throat: Oropharynx  is clear and moist. No oropharyngeal exudate.  Neck: Normal range of motion. Neck supple. No thyromegaly present.  Cardiovascular: Normal rate, regular rhythm, normal heart sounds and intact distal pulses.  Exam reveals no gallop.   No murmur heard. Pulmonary/Chest: Effort normal and breath sounds normal. No respiratory distress. She has no wheezes. She has no rales.  Abdominal: Soft. There is no tenderness.  Musculoskeletal: She exhibits no edema or tenderness.  Lymphadenopathy:    She has no cervical adenopathy.  Neurological: She is alert and oriented to person, place, and time.  President-- "Obama, Bush, Clinton" [phone] [id number] Recall 2/3  Skin: No rash noted. No erythema.  Psychiatric: She has a normal mood and affect. Her behavior is normal.          Assessment & Plan:

## 2015-02-13 NOTE — Progress Notes (Signed)
Pre visit review using our clinic review tool, if applicable. No additional management support is needed unless otherwise documented below in the visit note. 

## 2015-02-13 NOTE — Assessment & Plan Note (Signed)
Ongoing pain Will have her try the tramadol

## 2015-02-14 ENCOUNTER — Encounter: Payer: Self-pay | Admitting: *Deleted

## 2015-02-19 DIAGNOSIS — J3081 Allergic rhinitis due to animal (cat) (dog) hair and dander: Secondary | ICD-10-CM | POA: Diagnosis not present

## 2015-02-19 DIAGNOSIS — J301 Allergic rhinitis due to pollen: Secondary | ICD-10-CM | POA: Diagnosis not present

## 2015-02-19 DIAGNOSIS — J3089 Other allergic rhinitis: Secondary | ICD-10-CM | POA: Diagnosis not present

## 2015-02-26 DIAGNOSIS — J301 Allergic rhinitis due to pollen: Secondary | ICD-10-CM | POA: Diagnosis not present

## 2015-02-26 DIAGNOSIS — J3089 Other allergic rhinitis: Secondary | ICD-10-CM | POA: Diagnosis not present

## 2015-02-26 DIAGNOSIS — J3081 Allergic rhinitis due to animal (cat) (dog) hair and dander: Secondary | ICD-10-CM | POA: Diagnosis not present

## 2015-03-07 DIAGNOSIS — J3081 Allergic rhinitis due to animal (cat) (dog) hair and dander: Secondary | ICD-10-CM | POA: Diagnosis not present

## 2015-03-07 DIAGNOSIS — J301 Allergic rhinitis due to pollen: Secondary | ICD-10-CM | POA: Diagnosis not present

## 2015-03-07 DIAGNOSIS — J3089 Other allergic rhinitis: Secondary | ICD-10-CM | POA: Diagnosis not present

## 2015-03-12 DIAGNOSIS — J3089 Other allergic rhinitis: Secondary | ICD-10-CM | POA: Diagnosis not present

## 2015-03-12 DIAGNOSIS — J3081 Allergic rhinitis due to animal (cat) (dog) hair and dander: Secondary | ICD-10-CM | POA: Diagnosis not present

## 2015-03-12 DIAGNOSIS — J301 Allergic rhinitis due to pollen: Secondary | ICD-10-CM | POA: Diagnosis not present

## 2015-03-19 DIAGNOSIS — J3081 Allergic rhinitis due to animal (cat) (dog) hair and dander: Secondary | ICD-10-CM | POA: Diagnosis not present

## 2015-03-19 DIAGNOSIS — J3089 Other allergic rhinitis: Secondary | ICD-10-CM | POA: Diagnosis not present

## 2015-03-19 DIAGNOSIS — J301 Allergic rhinitis due to pollen: Secondary | ICD-10-CM | POA: Diagnosis not present

## 2015-03-26 DIAGNOSIS — J301 Allergic rhinitis due to pollen: Secondary | ICD-10-CM | POA: Diagnosis not present

## 2015-03-26 DIAGNOSIS — J3081 Allergic rhinitis due to animal (cat) (dog) hair and dander: Secondary | ICD-10-CM | POA: Diagnosis not present

## 2015-03-26 DIAGNOSIS — J3089 Other allergic rhinitis: Secondary | ICD-10-CM | POA: Diagnosis not present

## 2015-04-02 DIAGNOSIS — J3081 Allergic rhinitis due to animal (cat) (dog) hair and dander: Secondary | ICD-10-CM | POA: Diagnosis not present

## 2015-04-02 DIAGNOSIS — J3089 Other allergic rhinitis: Secondary | ICD-10-CM | POA: Diagnosis not present

## 2015-04-02 DIAGNOSIS — J301 Allergic rhinitis due to pollen: Secondary | ICD-10-CM | POA: Diagnosis not present

## 2015-04-09 DIAGNOSIS — J3089 Other allergic rhinitis: Secondary | ICD-10-CM | POA: Diagnosis not present

## 2015-04-09 DIAGNOSIS — J3081 Allergic rhinitis due to animal (cat) (dog) hair and dander: Secondary | ICD-10-CM | POA: Diagnosis not present

## 2015-04-09 DIAGNOSIS — J301 Allergic rhinitis due to pollen: Secondary | ICD-10-CM | POA: Diagnosis not present

## 2015-04-15 DIAGNOSIS — J3081 Allergic rhinitis due to animal (cat) (dog) hair and dander: Secondary | ICD-10-CM | POA: Diagnosis not present

## 2015-04-15 DIAGNOSIS — J3089 Other allergic rhinitis: Secondary | ICD-10-CM | POA: Diagnosis not present

## 2015-04-16 DIAGNOSIS — J3081 Allergic rhinitis due to animal (cat) (dog) hair and dander: Secondary | ICD-10-CM | POA: Diagnosis not present

## 2015-04-16 DIAGNOSIS — J3089 Other allergic rhinitis: Secondary | ICD-10-CM | POA: Diagnosis not present

## 2015-04-16 DIAGNOSIS — J301 Allergic rhinitis due to pollen: Secondary | ICD-10-CM | POA: Diagnosis not present

## 2015-04-23 DIAGNOSIS — L719 Rosacea, unspecified: Secondary | ICD-10-CM | POA: Diagnosis not present

## 2015-04-23 DIAGNOSIS — L820 Inflamed seborrheic keratosis: Secondary | ICD-10-CM | POA: Diagnosis not present

## 2015-04-23 DIAGNOSIS — D1800 Hemangioma unspecified site: Secondary | ICD-10-CM | POA: Diagnosis not present

## 2015-04-23 DIAGNOSIS — L219 Seborrheic dermatitis, unspecified: Secondary | ICD-10-CM | POA: Diagnosis not present

## 2015-04-23 DIAGNOSIS — J3089 Other allergic rhinitis: Secondary | ICD-10-CM | POA: Diagnosis not present

## 2015-04-23 DIAGNOSIS — Z85828 Personal history of other malignant neoplasm of skin: Secondary | ICD-10-CM | POA: Diagnosis not present

## 2015-04-23 DIAGNOSIS — J301 Allergic rhinitis due to pollen: Secondary | ICD-10-CM | POA: Diagnosis not present

## 2015-04-23 DIAGNOSIS — L821 Other seborrheic keratosis: Secondary | ICD-10-CM | POA: Diagnosis not present

## 2015-04-23 DIAGNOSIS — Z1283 Encounter for screening for malignant neoplasm of skin: Secondary | ICD-10-CM | POA: Diagnosis not present

## 2015-04-23 DIAGNOSIS — J3081 Allergic rhinitis due to animal (cat) (dog) hair and dander: Secondary | ICD-10-CM | POA: Diagnosis not present

## 2015-04-30 DIAGNOSIS — J3089 Other allergic rhinitis: Secondary | ICD-10-CM | POA: Diagnosis not present

## 2015-04-30 DIAGNOSIS — J301 Allergic rhinitis due to pollen: Secondary | ICD-10-CM | POA: Diagnosis not present

## 2015-04-30 DIAGNOSIS — J3081 Allergic rhinitis due to animal (cat) (dog) hair and dander: Secondary | ICD-10-CM | POA: Diagnosis not present

## 2015-05-07 ENCOUNTER — Ambulatory Visit (INDEPENDENT_AMBULATORY_CARE_PROVIDER_SITE_OTHER): Payer: Medicare Other | Admitting: Internal Medicine

## 2015-05-07 ENCOUNTER — Encounter: Payer: Self-pay | Admitting: Internal Medicine

## 2015-05-07 VITALS — BP 120/60 | HR 67 | Temp 97.5°F | Wt 131.0 lb

## 2015-05-07 DIAGNOSIS — J3089 Other allergic rhinitis: Secondary | ICD-10-CM | POA: Diagnosis not present

## 2015-05-07 DIAGNOSIS — J301 Allergic rhinitis due to pollen: Secondary | ICD-10-CM | POA: Diagnosis not present

## 2015-05-07 DIAGNOSIS — J3081 Allergic rhinitis due to animal (cat) (dog) hair and dander: Secondary | ICD-10-CM | POA: Diagnosis not present

## 2015-05-07 DIAGNOSIS — Z23 Encounter for immunization: Secondary | ICD-10-CM

## 2015-05-07 DIAGNOSIS — M25552 Pain in left hip: Secondary | ICD-10-CM | POA: Diagnosis not present

## 2015-05-07 NOTE — Progress Notes (Signed)
Subjective:    Patient ID: [name] [name], female    DOB: 01-21-1924, 81 y.o.   MRN: [id number]  HPI Here due to worsened pain in right groin and hip/back  Sharpest pain is in groin--but intermittent. With certain movement Right low back and slightly down leg Goes back 2-3 months but worsening Worsens as the day goes on---also bothers her when turning in bed  Has to limp at times due to the pain Other times not as bad  No falls No new injury  Uses tylenol and celebrex regularly Tramadol may help slightly but bothers her bowels (so not generally taking) Unstable at night---using husband's walker then  Current Outpatient Prescriptions on File Prior to Visit  Medication Sig Dispense Refill  . acetaminophen (TYLENOL) 650 MG CR tablet Take 650 mg by mouth 3 (three) times daily.    Marland Kitchen aspirin 81 MG tablet Take 81 mg by mouth daily.    . celecoxib (CELEBREX) 200 MG capsule Take 1 capsule (200 mg total) by mouth daily. 90 capsule 3  . Cholecalciferol (VITAMIN D-3) 1000 UNITS CAPS Take by mouth daily.    Marland Kitchen DETROL LA 4 MG 24 hr capsule TAKE 1 CAPSULE DAILY 90 capsule 0  . hydrocortisone 2.5 % cream Apply topically 3 (three) times daily as needed. 28 g 3  . Lactobacillus (ACIDOPHILUS) 100 MG CAPS Take by mouth.    . Multiple Vitamin (MULTIVITAMIN) tablet Take 1 tablet by mouth daily.    . polyethylene glycol (MIRALAX / GLYCOLAX) packet Take 17 g by mouth daily as needed.    . Simethicone (GAS-X PO) Take by mouth 2 (two) times daily.    . traMADol (ULTRAM) 50 MG tablet Take 1 tablet (50 mg total) by mouth 3 (three) times daily as needed. 60 tablet 0   No current facility-administered medications on file prior to visit.    Allergies  Allergen Reactions  . Demerol [Meperidine]   . Shellfish Allergy     Past Medical History  Diagnosis Date  . Allergic rhinitis due to pollen     some shellfish also  . Osteoporosis   . Osteoarthritis, multiple sites   . Urge incontinence   .  Hyperlipidemia   . Aortic sclerosis ([place]) 5/12    On echo. no stenosis  . Diastolic dysfunction     stress echo otherwise normal 1/04. Repeat 5/11 also negative    Past Surgical History  Procedure Laterality Date  . Inguinal hernia repair  2012    left side with mesh  . Umbilical hernia repair  02/04/06  . Cystocele repair  2007    and rectocele  . Tonsillectomy and adenoidectomy      as child    Family History  Problem Relation Age of Onset  . Cancer Father 71    colon cancer  . Heart disease Father     Social History   Social History  . Marital Status: Married    Spouse Name: N/A  . Number of Children: 3  . Years of Education: N/A   Occupational History  . 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 Smoking status: Never Smoker   . Smokeless tobacco: Never Used  . Alcohol Use: No  . Drug Use: No  . Sexual Activity: Not on file   Other Topics Concern  . Not on file   Social History Narrative   Has  living will   Husband then daughter [name], is health care POA   Would accept trial of resuscitation   Would probably accept feeding tube   Review of Systems  Not sick No fever Appetite is fine     Objective:   Physical Exam  Constitutional: She appears well-developed and well-nourished. No distress.  Musculoskeletal:  No spine tenderness Tender in posterior right hip ROM is normal in left hip Marked restriction of external and internal rotation left hip-- pain recreated especially with internal rotation          Assessment & Plan:

## 2015-05-07 NOTE — Assessment & Plan Note (Addendum)
Pain seems to be from end stage hip osteoarthritis She has trouble with analgesics--other than tylenol or celebrex Caregiver for husband--but that is difficult now Will set up with Dr Marry Guan to consider whether THR would be a good idea

## 2015-05-07 NOTE — Progress Notes (Signed)
Pre visit review using our clinic review tool, if applicable. No additional management support is needed unless otherwise documented below in the visit note. 

## 2015-05-07 NOTE — Addendum Note (Signed)
Addended by: Despina Hidden on: 05/07/2015 02:54 PM   Modules accepted: Orders

## 2015-05-10 ENCOUNTER — Other Ambulatory Visit: Payer: Self-pay | Admitting: Internal Medicine

## 2015-05-14 DIAGNOSIS — J3089 Other allergic rhinitis: Secondary | ICD-10-CM | POA: Diagnosis not present

## 2015-05-14 DIAGNOSIS — M1611 Unilateral primary osteoarthritis, right hip: Secondary | ICD-10-CM | POA: Diagnosis not present

## 2015-05-14 DIAGNOSIS — J3081 Allergic rhinitis due to animal (cat) (dog) hair and dander: Secondary | ICD-10-CM | POA: Diagnosis not present

## 2015-05-14 DIAGNOSIS — J301 Allergic rhinitis due to pollen: Secondary | ICD-10-CM | POA: Diagnosis not present

## 2015-05-14 DIAGNOSIS — M25551 Pain in right hip: Secondary | ICD-10-CM | POA: Diagnosis not present

## 2015-05-15 ENCOUNTER — Encounter
Admission: RE | Admit: 2015-05-15 | Discharge: 2015-05-15 | Disposition: A | Discharge status: Home / Self Care | Payer: Medicare Other | Source: Ambulatory Visit | Attending: Orthopedic Surgery | Admitting: Orthopedic Surgery

## 2015-05-15 DIAGNOSIS — Z01818 Encounter for other preprocedural examination: Secondary | ICD-10-CM | POA: Insufficient documentation

## 2015-05-15 DIAGNOSIS — M1611 Unilateral primary osteoarthritis, right hip: Secondary | ICD-10-CM | POA: Diagnosis not present

## 2015-05-15 DIAGNOSIS — E785 Hyperlipidemia, unspecified: Secondary | ICD-10-CM | POA: Diagnosis not present

## 2015-05-15 HISTORY — DX: Unspecified osteoarthritis, unspecified site: M19.90

## 2015-05-15 HISTORY — DX: Overactive bladder: N32.81

## 2015-05-15 HISTORY — DX: Allergy status to unspecified drugs, medicaments and biological substances: Z88.9

## 2015-05-15 LAB — BASIC METABOLIC PANEL
Anion gap: 9 (ref 5–15)
BUN: 19 mg/dL (ref 6–20)
CALCIUM: 9.3 mg/dL (ref 8.9–10.3)
CO2: 24 mmol/L (ref 22–32)
CREATININE: 0.64 mg/dL (ref 0.44–1.00)
Chloride: 102 mmol/L (ref 101–111)
GFR calc Af Amer: >60 mL/min (ref >60)
GFR calc non Af Amer: >60 mL/min (ref >60)
Glucose, Bld: 99 mg/dL (ref 65–99)
Potassium: 4.3 mmol/L (ref 3.5–5.1)
SODIUM: 135 mmol/L (ref 135–145)

## 2015-05-15 LAB — CBC
HCT: 36.9 % (ref 35.0–47.0)
Hemoglobin: 12.3 g/dL (ref 12.0–16.0)
MCH: 31 pg (ref 26.0–34.0)
MCHC: 33.5 g/dL (ref 32.0–36.0)
MCV: 92.6 fL (ref 80.0–100.0)
PLATELETS: 277 10*3/uL (ref 150–440)
RBC: 3.98 MIL/uL (ref 3.80–5.20)
RDW: 13.3 % (ref 11.5–14.5)
WBC: 10.1 10*3/uL (ref 3.6–11.0)

## 2015-05-15 LAB — SURGICAL PCR SCREEN
MRSA, PCR: NEGATIVE
Staphylococcus aureus: POSITIVE — AB

## 2015-05-15 LAB — TYPE AND SCREEN
ABO/RH(D): O POS
Antibody Screen: NEGATIVE

## 2015-05-15 LAB — PROTIME-INR
INR: 0.98
PROTHROMBIN TIME: 13.2 s (ref 11.4–15.0)

## 2015-05-15 LAB — URINALYSIS COMPLETE WITH MICROSCOPIC (ARMC ONLY)
BACTERIA UA: NONE SEEN
BILIRUBIN URINE: NEGATIVE
GLUCOSE, UA: NEGATIVE mg/dL
Hgb urine dipstick: NEGATIVE
Ketones, ur: NEGATIVE mg/dL
Nitrite: NEGATIVE
PROTEIN: NEGATIVE mg/dL
RBC / HPF: NONE SEEN RBC/hpf (ref 0–5)
Specific Gravity, Urine: 1.015 (ref 1.005–1.030)
Squamous Epithelial / LPF: NONE SEEN
pH: 7 (ref 5.0–8.0)

## 2015-05-15 LAB — SEDIMENTATION RATE: Sed Rate: 24 mm/hr (ref 0–30)

## 2015-05-15 LAB — APTT: aPTT: 31 seconds (ref 24–36)

## 2015-05-15 NOTE — Patient Instructions (Signed)
  Your procedure is scheduled on: Thurs 05/23/15 Report to Day Surgery.2nd floor medical mall  To find out your arrival time please call [phone] between 1PM - 3PM on 05/22/15 Wed.  Remember: Instructions that are not followed completely may result in serious medical risk, up to and including death, or upon the discretion of your surgeon and anesthesiologist your surgery may need to be rescheduled.    _x___ 1. Do not eat food or drink liquids after midnight. No gum chewing or hard candies.     _x___ 2. No Alcohol for 24 hours before or after surgery.   ____ 3. Bring all medications with you on the day of surgery if instructed.    __x__ 4. Notify your doctor if there is any change in your medical condition     (cold, fever, infections).     Do not wear jewelry, make-up, hairpins, clips or nail polish.  Do not wear lotions, powders, or perfumes. You may wear deodorant.  Do not shave 48 hours prior to surgery. Men may shave face and neck.  Do not bring valuables to the hospital.    [facility] is not responsible for any belongings or valuables.               Contacts, dentures or bridgework may not be worn into surgery.  Leave your suitcase in the car. After surgery it may be brought to your room.  For patients admitted to the hospital, discharge time is determined by your                treatment team.   Patients discharged the day of surgery will not be allowed to drive home.   Please read over the following fact sheets that you were given:   MRSA Information   _x___ Take these medicines the morning of surgery with A SIP OF WATER:    1. None  2.   3.   4.  5.  6.  ____ Fleet Enema (as directed)   __x__ Use CHG Soap as directed  ____ Use inhalers on the day of surgery  ____ Stop metformin 2 days prior to surgery    ____ Take 1/2 of usual insulin dose the night before surgery and none on the morning of surgery.   _x___ Stop Coumadin/Plavix/aspirin on stop aspirin 1  week before surgery  ____ Stop Anti-inflammatories on    ____ Stop supplements until after surgery.    ____ Bring C-Pap to the hospital.

## 2015-05-16 LAB — ABO/RH: ABO/RH(D): O POS

## 2015-05-16 NOTE — Pre-Procedure Instructions (Signed)
Nasal swab results: MRSA negative. Staph Aureus POSITIVE. Results faxed to Dr. Rudene Christians office and called to [facility] regarding lab results.

## 2015-05-21 DIAGNOSIS — J3081 Allergic rhinitis due to animal (cat) (dog) hair and dander: Secondary | ICD-10-CM | POA: Diagnosis not present

## 2015-05-21 DIAGNOSIS — J301 Allergic rhinitis due to pollen: Secondary | ICD-10-CM | POA: Diagnosis not present

## 2015-05-21 DIAGNOSIS — J3089 Other allergic rhinitis: Secondary | ICD-10-CM | POA: Diagnosis not present

## 2015-05-23 ENCOUNTER — Inpatient Hospital Stay
Admission: RE | Admit: 2015-05-23 | Discharge: 2015-05-26 | DRG: 470 | Disposition: A | Discharge status: Skilled Nursing Facility | Payer: Medicare Other | Source: Ambulatory Visit | Attending: Orthopedic Surgery | Admitting: Orthopedic Surgery

## 2015-05-23 ENCOUNTER — Inpatient Hospital Stay: Payer: Medicare Other | Admitting: Anesthesiology

## 2015-05-23 ENCOUNTER — Inpatient Hospital Stay: Payer: Medicare Other

## 2015-05-23 ENCOUNTER — Encounter: Payer: Self-pay | Admitting: *Deleted

## 2015-05-23 ENCOUNTER — Encounter
Admission: RE | Disposition: A | Discharge status: Skilled Nursing Facility | Payer: Self-pay | Source: Ambulatory Visit | Attending: Orthopedic Surgery

## 2015-05-23 DIAGNOSIS — D62 Acute posthemorrhagic anemia: Secondary | ICD-10-CM | POA: Diagnosis not present

## 2015-05-23 DIAGNOSIS — Z7982 Long term (current) use of aspirin: Secondary | ICD-10-CM

## 2015-05-23 DIAGNOSIS — R339 Retention of urine, unspecified: Secondary | ICD-10-CM | POA: Diagnosis not present

## 2015-05-23 DIAGNOSIS — Z96641 Presence of right artificial hip joint: Secondary | ICD-10-CM | POA: Diagnosis not present

## 2015-05-23 DIAGNOSIS — M4690 Unspecified inflammatory spondylopathy, site unspecified: Secondary | ICD-10-CM | POA: Diagnosis present

## 2015-05-23 DIAGNOSIS — Z87891 Personal history of nicotine dependence: Secondary | ICD-10-CM

## 2015-05-23 DIAGNOSIS — I5030 Unspecified diastolic (congestive) heart failure: Secondary | ICD-10-CM | POA: Diagnosis not present

## 2015-05-23 DIAGNOSIS — G8918 Other acute postprocedural pain: Secondary | ICD-10-CM

## 2015-05-23 DIAGNOSIS — M6281 Muscle weakness (generalized): Secondary | ICD-10-CM | POA: Diagnosis not present

## 2015-05-23 DIAGNOSIS — N3941 Urge incontinence: Secondary | ICD-10-CM | POA: Diagnosis present

## 2015-05-23 DIAGNOSIS — R2689 Other abnormalities of gait and mobility: Secondary | ICD-10-CM | POA: Diagnosis not present

## 2015-05-23 DIAGNOSIS — M810 Age-related osteoporosis without current pathological fracture: Secondary | ICD-10-CM | POA: Diagnosis present

## 2015-05-23 DIAGNOSIS — T148 Other injury of unspecified body region: Secondary | ICD-10-CM | POA: Diagnosis not present

## 2015-05-23 DIAGNOSIS — M169 Osteoarthritis of hip, unspecified: Secondary | ICD-10-CM | POA: Diagnosis not present

## 2015-05-23 DIAGNOSIS — M1611 Unilateral primary osteoarthritis, right hip: Secondary | ICD-10-CM | POA: Diagnosis present

## 2015-05-23 DIAGNOSIS — M1990 Unspecified osteoarthritis, unspecified site: Secondary | ICD-10-CM | POA: Diagnosis not present

## 2015-05-23 DIAGNOSIS — Z471 Aftercare following joint replacement surgery: Secondary | ICD-10-CM | POA: Diagnosis not present

## 2015-05-23 DIAGNOSIS — N3281 Overactive bladder: Secondary | ICD-10-CM | POA: Diagnosis not present

## 2015-05-23 DIAGNOSIS — Z96649 Presence of unspecified artificial hip joint: Secondary | ICD-10-CM

## 2015-05-23 HISTORY — PX: TOTAL HIP ARTHROPLASTY: SHX124

## 2015-05-23 LAB — CBC
HEMATOCRIT: 32.8 % — AB (ref 35.0–47.0)
HEMOGLOBIN: 11.1 g/dL — AB (ref 12.0–16.0)
MCH: 31.4 pg (ref 26.0–34.0)
MCHC: 33.7 g/dL (ref 32.0–36.0)
MCV: 93.3 fL (ref 80.0–100.0)
Platelets: 226 10*3/uL (ref 150–440)
RBC: 3.52 MIL/uL — AB (ref 3.80–5.20)
RDW: 13.6 % (ref 11.5–14.5)
WBC: 10.6 10*3/uL (ref 3.6–11.0)

## 2015-05-23 LAB — CREATININE, SERUM
Creatinine, Ser: 0.61 mg/dL (ref 0.44–1.00)
GFR calc Af Amer: >60 mL/min (ref >60)
GFR calc non Af Amer: >60 mL/min (ref >60)

## 2015-05-23 SURGERY — ARTHROPLASTY, HIP, TOTAL, ANTERIOR APPROACH
Anesthesia: Spinal | Laterality: Right

## 2015-05-23 MED ORDER — ACETAMINOPHEN 650 MG RE SUPP: 650.0000 mg | Freq: Four times a day (QID) | RECTAL | Status: DC | PRN

## 2015-05-23 MED ORDER — PHENOL 1.4 % MT LIQD
1.0000 | OROMUCOSAL | Status: DC | PRN
  Filled 2015-05-23: qty 177

## 2015-05-23 MED ORDER — ASPIRIN 81 MG PO TABS
81.0000 mg | ORAL_TABLET | Freq: Every day | ORAL | Status: DC
  Filled 2015-05-23: qty 1

## 2015-05-23 MED ORDER — ACETAMINOPHEN 325 MG PO TABS
650.0000 mg | ORAL_TABLET | Freq: Four times a day (QID) | ORAL | Status: DC | PRN
  Administered 2015-05-26: 650 mg via ORAL

## 2015-05-23 MED ORDER — CEFAZOLIN SODIUM 1-5 GM-% IV SOLN
INTRAVENOUS | Status: AC
  Administered 2015-05-23: 1 g via INTRAVENOUS
  Filled 2015-05-23: qty 50

## 2015-05-23 MED ORDER — FAMOTIDINE 20 MG PO TABS
ORAL_TABLET | ORAL | Status: AC
  Administered 2015-05-23: 20 mg via ORAL
  Filled 2015-05-23: qty 1

## 2015-05-23 MED ORDER — METOCLOPRAMIDE HCL 5 MG PO TABS: 5.0000 mg | ORAL_TABLET | Freq: Three times a day (TID) | ORAL | Status: DC | PRN

## 2015-05-23 MED ORDER — VITAMIN D 1000 UNITS PO TABS
ORAL_TABLET | Freq: Every day | ORAL | Status: DC
  Filled 2015-05-23: qty 1

## 2015-05-23 MED ORDER — ACIDOPHILUS 100 MG PO CAPS
1.0000 | ORAL_CAPSULE | Freq: Every day | ORAL | Status: DC
  Filled 2015-05-23: qty 1

## 2015-05-23 MED ORDER — SODIUM CHLORIDE 0.9 % IV SOLN
INTRAVENOUS | Status: DC
  Administered 2015-05-23: 16:00:00 via INTRAVENOUS

## 2015-05-23 MED ORDER — CEFAZOLIN SODIUM 1-5 GM-% IV SOLN: 1.0000 g | Freq: Once | INTRAVENOUS | Status: DC

## 2015-05-23 MED ORDER — TRANEXAMIC ACID 1000 MG/10ML IV SOLN
1000.0000 mg | INTRAVENOUS | Status: AC
  Administered 2015-05-23: 1000 mg via INTRAVENOUS
  Filled 2015-05-23: qty 10

## 2015-05-23 MED ORDER — ONDANSETRON HCL 4 MG/2ML IJ SOLN: 4.0000 mg | Freq: Once | INTRAMUSCULAR | Status: DC | PRN

## 2015-05-23 MED ORDER — FESOTERODINE FUMARATE ER 4 MG PO TB24
4.0000 mg | ORAL_TABLET | Freq: Every day | ORAL | Status: DC
  Administered 2015-05-24 – 2015-05-26 (×3): 4 mg via ORAL
  Filled 2015-05-23 (×4): qty 1

## 2015-05-23 MED ORDER — PROPOFOL 500 MG/50ML IV EMUL
INTRAVENOUS | Status: DC | PRN
  Administered 2015-05-23: 100 ug/kg/min via INTRAVENOUS

## 2015-05-23 MED ORDER — MENTHOL 3 MG MT LOZG
1.0000 | LOZENGE | OROMUCOSAL | Status: DC | PRN
Start: 2015-05-23 — End: 2015-05-26
  Filled 2015-05-23: qty 9

## 2015-05-23 MED ORDER — METOCLOPRAMIDE HCL 5 MG/ML IJ SOLN
5.0000 mg | Freq: Three times a day (TID) | INTRAMUSCULAR | Status: DC | PRN
  Administered 2015-05-23: 10 mg via INTRAVENOUS
  Filled 2015-05-23: qty 2

## 2015-05-23 MED ORDER — ONDANSETRON HCL 4 MG PO TABS: 4.0000 mg | ORAL_TABLET | Freq: Four times a day (QID) | ORAL | Status: DC | PRN

## 2015-05-23 MED ORDER — LACTATED RINGERS IV SOLN
INTRAVENOUS | Status: DC
  Administered 2015-05-23 (×2): via INTRAVENOUS

## 2015-05-23 MED ORDER — CEFAZOLIN SODIUM 1-5 GM-% IV SOLN
1.0000 g | Freq: Four times a day (QID) | INTRAVENOUS | Status: AC
  Administered 2015-05-23 – 2015-05-24 (×3): 1 g via INTRAVENOUS
  Filled 2015-05-23 (×3): qty 50

## 2015-05-23 MED ORDER — ACETAMINOPHEN 325 MG PO TABS
650.0000 mg | ORAL_TABLET | Freq: Four times a day (QID) | ORAL | Status: DC | PRN
  Filled 2015-05-23: qty 2

## 2015-05-23 MED ORDER — ACETAMINOPHEN ER 650 MG PO TBCR: 650.0000 mg | EXTENDED_RELEASE_TABLET | Freq: Three times a day (TID) | ORAL | Status: DC

## 2015-05-23 MED ORDER — BUPIVACAINE HCL (PF) 0.5 % IJ SOLN
INTRAMUSCULAR | Status: DC | PRN
  Administered 2015-05-23: 13.5 mL

## 2015-05-23 MED ORDER — RISAQUAD PO CAPS
1.0000 | ORAL_CAPSULE | Freq: Every day | ORAL | Status: DC
  Administered 2015-05-24 – 2015-05-26 (×3): 1 via ORAL
  Filled 2015-05-23 (×3): qty 1

## 2015-05-23 MED ORDER — VITAMIN D 1000 UNITS PO TABS
1000.0000 [IU] | ORAL_TABLET | Freq: Every day | ORAL | Status: DC
  Administered 2015-05-24 – 2015-05-26 (×3): 1000 [IU] via ORAL
  Filled 2015-05-23 (×3): qty 1

## 2015-05-23 MED ORDER — FAMOTIDINE 20 MG PO TABS
20.0000 mg | ORAL_TABLET | Freq: Once | ORAL | Status: AC
  Administered 2015-05-23: 20 mg via ORAL

## 2015-05-23 MED ORDER — ADULT MULTIVITAMIN W/MINERALS CH
1.0000 | ORAL_TABLET | Freq: Every day | ORAL | Status: DC
  Administered 2015-05-24 – 2015-05-26 (×3): 1 via ORAL
  Filled 2015-05-23 (×4): qty 1

## 2015-05-23 MED ORDER — FENTANYL CITRATE (PF) 100 MCG/2ML IJ SOLN: 25.0000 ug | INTRAMUSCULAR | Status: DC | PRN

## 2015-05-23 MED ORDER — ONDANSETRON HCL 4 MG/2ML IJ SOLN
4.0000 mg | Freq: Four times a day (QID) | INTRAMUSCULAR | Status: DC | PRN
  Administered 2015-05-23: 4 mg via INTRAVENOUS
  Filled 2015-05-23: qty 2

## 2015-05-23 MED ORDER — PROPOFOL 10 MG/ML IV BOLUS
INTRAVENOUS | Status: DC | PRN
  Administered 2015-05-23 (×2): 10 mg via INTRAVENOUS

## 2015-05-23 MED ORDER — PHENYLEPHRINE HCL 10 MG/ML IJ SOLN
INTRAMUSCULAR | Status: DC | PRN
  Administered 2015-05-23: 100 ug via INTRAVENOUS
  Administered 2015-05-23 (×2): 50 ug via INTRAVENOUS

## 2015-05-23 MED ORDER — MORPHINE SULFATE (PF) 2 MG/ML IV SOLN
2.0000 mg | INTRAVENOUS | Status: DC | PRN
  Administered 2015-05-23: 2 mg via INTRAVENOUS
  Filled 2015-05-23: qty 1

## 2015-05-23 MED ORDER — NEOMYCIN-POLYMYXIN B GU 40-200000 IR SOLN
Status: AC
  Filled 2015-05-23: qty 20

## 2015-05-23 MED ORDER — RISAQUAD PO CAPS
ORAL_CAPSULE | Freq: Every day | ORAL | Status: DC
  Filled 2015-05-23: qty 1

## 2015-05-23 MED ORDER — ENOXAPARIN SODIUM 40 MG/0.4ML ~~LOC~~ SOLN
40.0000 mg | SUBCUTANEOUS | Status: DC
  Administered 2015-05-24 – 2015-05-26 (×3): 40 mg via SUBCUTANEOUS
  Filled 2015-05-23 (×3): qty 0.4

## 2015-05-23 MED ORDER — ASPIRIN 81 MG PO CHEW
81.0000 mg | CHEWABLE_TABLET | Freq: Every day | ORAL | Status: DC
  Administered 2015-05-24 – 2015-05-26 (×3): 81 mg via ORAL
  Filled 2015-05-23 (×3): qty 1

## 2015-05-23 MED ORDER — HYDROCORTISONE 2.5 % EX CREA
TOPICAL_CREAM | Freq: Two times a day (BID) | CUTANEOUS | Status: DC
  Filled 2015-05-23: qty 30

## 2015-05-23 MED ORDER — SODIUM CHLORIDE 0.9 % IV BOLUS (SEPSIS)
500.0000 mL | Freq: Once | INTRAVENOUS | Status: AC
  Administered 2015-05-23: 500 mL via INTRAVENOUS

## 2015-05-23 MED ORDER — BUPIVACAINE-EPINEPHRINE (PF) 0.25% -1:200000 IJ SOLN
INTRAMUSCULAR | Status: AC
  Filled 2015-05-23: qty 30

## 2015-05-23 MED ORDER — FENTANYL CITRATE (PF) 100 MCG/2ML IJ SOLN
INTRAMUSCULAR | Status: DC | PRN
  Administered 2015-05-23 (×4): 25 ug via INTRAVENOUS

## 2015-05-23 MED ORDER — HYDROCODONE-ACETAMINOPHEN 5-325 MG PO TABS
1.0000 | ORAL_TABLET | ORAL | Status: DC | PRN
  Administered 2015-05-23 – 2015-05-26 (×10): 1 via ORAL
  Filled 2015-05-23 (×10): qty 1

## 2015-05-23 MED ORDER — POLYETHYLENE GLYCOL 3350 17 G PO PACK: 17.0000 g | PACK | Freq: Every day | ORAL | Status: DC | PRN

## 2015-05-23 MED ORDER — HYDROCORTISONE 1 % EX CREA
TOPICAL_CREAM | Freq: Two times a day (BID) | CUTANEOUS | Status: DC
  Administered 2015-05-24 – 2015-05-25 (×2): via TOPICAL
  Filled 2015-05-23: qty 28

## 2015-05-23 SURGICAL SUPPLY — 43 items
BLADE SAW 1/2 (BLADE) ×3 IMPLANT
BNDG COHESIVE 6X5 TAN STRL LF (GAUZE/BANDAGES/DRESSINGS) ×6 IMPLANT
CANISTER SUCT 1200ML W/VALVE (MISCELLANEOUS) ×3 IMPLANT
CAPT HIP TOTAL 3 ×3 IMPLANT
CATH FOL LEG HOLDER (MISCELLANEOUS) ×3 IMPLANT
CATH TRAY METER 16FR LF (MISCELLANEOUS) IMPLANT
CHLORAPREP W/TINT 26ML (MISCELLANEOUS) ×3 IMPLANT
DRAPE C-ARM XRAY 36X54 (DRAPES) ×3 IMPLANT
DRAPE INCISE IOBAN 66X60 STRL (DRAPES) IMPLANT
DRAPE POUCH INSTRU U-SHP 10X18 (DRAPES) ×3 IMPLANT
DRAPE SHEET LG 3/4 BI-LAMINATE (DRAPES) ×9 IMPLANT
DRAPE TABLE BACK 80X90 (DRAPES) ×3 IMPLANT
ELECT BLADE 6.5 EXT (BLADE) ×3 IMPLANT
GAUZE SPONGE 4X4 12PLY STRL (GAUZE/BANDAGES/DRESSINGS) ×3 IMPLANT
GLOVE BIOGEL PI IND STRL 9 (GLOVE) ×1 IMPLANT
GLOVE BIOGEL PI INDICATOR 9 (GLOVE) ×2
GLOVE SURG ORTHO 9.0 STRL STRW (GLOVE) ×3 IMPLANT
GOWN SPECIALTY ULTRA XL (MISCELLANEOUS) ×3 IMPLANT
GOWN STRL REUS W/ TWL LRG LVL3 (GOWN DISPOSABLE) ×1 IMPLANT
GOWN STRL REUS W/TWL LRG LVL3 (GOWN DISPOSABLE) ×2
HEMOVAC 400CC 10FR (MISCELLANEOUS) ×3 IMPLANT
HOOD PEEL AWAY FACE SHEILD DIS (HOOD) ×3 IMPLANT
MAT BLUE FLOOR 46X72 FLO (MISCELLANEOUS) ×3 IMPLANT
NDL SAFETY 18GX1.5 (NEEDLE) ×3 IMPLANT
NEEDLE SPNL 18GX3.5 QUINCKE PK (NEEDLE) ×3 IMPLANT
NS IRRIG 1000ML POUR BTL (IV SOLUTION) ×3 IMPLANT
PACK HIP COMPR (MISCELLANEOUS) ×3 IMPLANT
SOL PREP PVP 2OZ (MISCELLANEOUS) ×3
SOLUTION PREP PVP 2OZ (MISCELLANEOUS) ×1 IMPLANT
STAPLER SKIN PROX 35W (STAPLE) ×3 IMPLANT
STRAP SAFETY BODY (MISCELLANEOUS) ×3 IMPLANT
SUT DVC 2 QUILL PDO  T11 36X36 (SUTURE) ×2
SUT DVC 2 QUILL PDO T11 36X36 (SUTURE) ×1 IMPLANT
SUT DVC QUILL MONODERM 30X30 (SUTURE) ×3 IMPLANT
SUT ETHIBOND NAB CT1 #1 30IN (SUTURE) ×3 IMPLANT
SUT SILK 0 (SUTURE) ×2
SUT SILK 0 30XBRD TIE 6 (SUTURE) ×1 IMPLANT
SUT VIC AB 1 CT1 36 (SUTURE) ×3 IMPLANT
SYR 20CC LL (SYRINGE) ×3 IMPLANT
SYR 30ML LL (SYRINGE) ×3 IMPLANT
TAPE MICROFOAM 4IN (TAPE) ×3 IMPLANT
TUBE KAMVAC SUCTION (TUBING) IMPLANT
WATER STERILE IRR 1000ML POUR (IV SOLUTION) ×3 IMPLANT

## 2015-05-23 NOTE — Progress Notes (Signed)
Pt with back pain after spinal. md orders k-pad

## 2015-05-23 NOTE — Transfer of Care (Signed)
Immediate Anesthesia Transfer of Care Note  Patient: [name] [name]  Procedure(s) Performed: Procedure(s): TOTAL HIP ARTHROPLASTY ANTERIOR APPROACH (Right)  Patient Location: PACU  Anesthesia Type:General  Level of Consciousness: awake, alert  and oriented  Airway & Oxygen Therapy: Patient Spontanous Breathing  Post-op Assessment: Report given to RN  Post vital signs: Reviewed  Last Vitals:  Filed Vitals:   05/23/15 0831  BP: 164/67  Pulse: 68  Temp: 36.8 C  Resp: 16    Complications: No apparent anesthesia complications

## 2015-05-23 NOTE — Progress Notes (Signed)
oob to chair. bp 88/48. rn notified dr Rudene Christians who ordered 500ns bolus over 2 hrs. Neuro checks wnl. Intermittent n/v. Medicated with zofran and reglan with fair relief

## 2015-05-23 NOTE — Evaluation (Signed)
Physical Therapy Evaluation Patient Details Name: [name] [name] MRN: [id number] DOB: 10/23/1923 Today's Date: 05/23/2015   History of Present Illness  Pt is a 81 yo female who was admitted to the hospital s/p R THA anterior approach  Clinical Impression  Pt presents with hx of osteoporosis, arthritis, hyperlipidemia, and overactive bladder. Examination reveals that pt performs all mobility with min assist and cues from therapist for safe practices. Upon getting into sitting pt had a round of vomiting that subsided after about 3-5 minutes. Pt then was able to perform a transfer and minor ambulation to her chair with fair functional strength. Pt has acute strength, ROM, and gait deficits that will continue to benefit from skilled PT in order for her to eventually return home safely. Pt was very pleasant to work with and she expressed her motivation to participate in therapy tasks.     Follow Up Recommendations SNF    Equipment Recommendations  Rolling walker with 5" wheels    Recommendations for Other Services       Precautions / Restrictions Precautions Precautions: Anterior Hip;Fall Restrictions Weight Bearing Restrictions: Yes Other Position/Activity Restrictions: WBAT      Mobility  Bed Mobility Overal bed mobility: Needs Assistance Bed Mobility: Supine to Sit     Supine to sit: Min assist     General bed mobility comments: Pt with good use of handrails but requires assist for management of RLE in getting to EOB  Transfers Overall transfer level: Needs assistance Equipment used: Rolling walker (2 wheeled) Transfers: Sit to/from Stand Sit to Stand: Min assist         General transfer comment: Pt requires minor assist to get into standing as well as cues for appropriate use of hands.   Ambulation/Gait Ambulation/Gait assistance: Min assist Ambulation Distance (Feet): 3 Feet Assistive device: Rolling walker (2 wheeled) Gait Pattern/deviations: Step-to  pattern;Decreased step length - right;Decreased step length - left;Decreased stride length;Shuffle Gait velocity: decreased Gait velocity interpretation: Below normal speed for age/gender General Gait Details: Pt requires assist for management of RW and cues for sequencing or RW with gait. She is stable in standing with no unsteadiness or LOB.   Stairs            Wheelchair Mobility    Modified Rankin (Stroke Patients Only)       Balance Overall balance assessment: No apparent balance deficits (not formally assessed)                                           Pertinent Vitals/Pain Pain Assessment: 0-10 Pain Score: 5  Pain Location: R hip Pain Intervention(s): Limited activity within patient's tolerance;Monitored during session;Premedicated before session;Utilized relaxation techniques    Home Living Family/patient expects to be discharged to:: Skilled nursing facility Living Arrangements: Spouse/significant other (husband)               Additional Comments: Pt lives in town home at [place], level entry, no stairs.    Prior Function Level of Independence: Independent         Comments: Pt walking community distances and performing ADLs indep. Also driving car indep      Hand Dominance        Extremity/Trunk Assessment   Upper Extremity Assessment: Overall WFL for tasks assessed           Lower Extremity Assessment: RLE deficits/detail RLE Deficits /  Details: Gross MMT 2/5 in RLE       Communication   Communication: No difficulties  Cognition Arousal/Alertness: Awake/alert Behavior During Therapy: WFL for tasks assessed/performed Overall Cognitive Status: Within Functional Limits for tasks assessed                      General Comments      Exercises Other Exercises Other Exercises: Pt performed bilateral therex x 10 reps at min assist for facilitation of movement. Exercises included: ankle pumps, quad sets,  glute sets, SAQ, SLR, and hip abd.       Assessment/Plan    PT Assessment Patient needs continued PT services  PT Diagnosis Difficulty walking;Abnormality of gait   PT Problem List Decreased strength;Decreased range of motion;Decreased mobility;Decreased activity tolerance;Pain  PT Treatment Interventions DME instruction;Gait training;Stair training;Functional mobility training;Therapeutic exercise;Therapeutic activities;Balance training;Neuromuscular re-education   PT Goals (Current goals can be found in the Care Plan section) Acute Rehab PT Goals Patient Stated Goal: to return home eventually PT Goal Formulation: With patient Time For Goal Achievement: 06/06/15 Potential to Achieve Goals: Good    Frequency BID   Barriers to discharge        Co-evaluation               End of Session Equipment Utilized During Treatment: Gait belt Activity Tolerance: Patient tolerated treatment well Patient left: in chair;with call bell/phone within reach;with chair alarm set;with SCD's reapplied Nurse Communication: Mobility status         Time: 1500-1526 PT Time Calculation (min) (ACUTE ONLY): 26 min   Charges:         PT G CodesJanyth Contes 05/30/2015, 4:38 PM  Janyth Contes, SPT. [phone]

## 2015-05-23 NOTE — H&P (Signed)
Reviewed paper H+P, will be scanned into chart. No changes noted.  

## 2015-05-23 NOTE — Anesthesia Preprocedure Evaluation (Signed)
Anesthesia Evaluation  Patient identified by MRN, date of birth, ID band Patient awake    Reviewed: Allergy & Precautions, H&P , NPO status , Patient's Chart, lab work & pertinent test results, reviewed documented beta blocker date and time   History of Anesthesia Complications Negative for: history of anesthetic complications  Airway Mallampati: II  TM Distance: >3 FB Neck ROM: full    Dental no notable dental hx. (+) Teeth Intact Permanent bridge on top and bottom:   Pulmonary neg shortness of breath, neg sleep apnea, neg COPD, neg recent URI, former smoker,    Pulmonary exam normal breath sounds clear to auscultation       Cardiovascular Exercise Tolerance: Good negative cardio ROS Normal cardiovascular exam Rhythm:regular Rate:Normal     Neuro/Psych negative neurological ROS  negative psych ROS   GI/Hepatic negative GI ROS, Neg liver ROS,   Endo/Other  negative endocrine ROS  Renal/GU negative Renal ROS  negative genitourinary   Musculoskeletal   Abdominal   Peds  Hematology negative hematology ROS (+)   Anesthesia Other Findings Past Medical History:   Allergic rhinitis due to pollen                                Comment:some shellfish also   Osteoporosis                                                 Osteoarthritis, multiple sites                               Urge incontinence                                            Hyperlipidemia                                               Aortic sclerosis ([place])                          5/12           Comment:On echo. no stenosis   Diastolic dysfunction                                          Comment:stress echo otherwise normal 1/04. Repeat 5/11               also negative   History of seasonal allergies                                Arthritis  Overactive bladder                                           Reproductive/Obstetrics negative OB ROS                             Anesthesia Physical Anesthesia Plan  ASA: II  Anesthesia Plan: Spinal   Post-op Pain Management:    Induction:   Airway Management Planned:   Additional Equipment:   Intra-op Plan:   Post-operative Plan:   Informed Consent: I have reviewed the patients History and Physical, chart, labs and discussed the procedure including the risks, benefits and alternatives for the proposed anesthesia with the patient or authorized representative who has indicated his/her understanding and acceptance.   Dental Advisory Given  Plan Discussed with: Anesthesiologist, CRNA and Surgeon  Anesthesia Plan Comments:         Anesthesia Quick Evaluation

## 2015-05-23 NOTE — Op Note (Signed)
05/23/2015  12:38 PM  PATIENT:  [name] [name]  81 y.o. female  PRE-OPERATIVE DIAGNOSIS:  osteoarthritis right hip primary  POST-OPERATIVE DIAGNOSIS: Primary osteoarthritis right hip  PROCEDURE:  Procedure(s): TOTAL HIP ARTHROPLASTY ANTERIOR APPROACH (Right)  SURGEON: Laurene Footman, MD  ASSISTANTS: None  ANESTHESIA:   spinal  EBL:  Total I/O In: 1000 [I.V.:1000] Out: 200 [Blood:200]  BLOOD ADMINISTERED:none  DRAINS: none   LOCAL MEDICATIONS USED:  MARCAINE     SPECIMEN:  Source of Specimen:  Right femoral head  DISPOSITION OF SPECIMEN:  PATHOLOGY  COUNTS:  YES  TOURNIQUET:  * No tourniquets in log *  IMPLANTS: Medacta AMIS size 3 collared stem, 50 mm Mpact DM with liner and S 28 mm head  DICTATION: .Dragon Dictation   The patient was brought to the operating room and after spinal anesthesia was obtained patient was placed on the operative table with the ipsilateral foot into the Medacta attachment, contralateral leg on a well-padded table. C-arm was brought in and preop template x-ray taken. After prepping and draping in usual sterile fashion appropriate patient identification and timeout procedures were completed. Anterior approach to the hip was obtained and centered over the greater trochanter and TFL muscle. The subcutaneous tissue was incised hemostasis being achieved by electrocautery. TFL fascia was incised and the muscle retracted laterally deep retractor placed. The lateral femoral circumflex vessels were identified and ligated. The anterior capsule was exposed and a capsulotomy performed. The neck was identified and a femoral neck cut carried out with a saw. The head was removed without difficulty and showed sclerotic femoral head and acetabulum. Reaming was carried out to 48 mm and a 50 mm cup trial gave appropriate tightness to the acetabular component a 50 cup was impacted into position. The leg was then externally rotated and ischiofemoral and pubofemoral  releases carried out. The femur was sequentially broached to a size 3, size 3 stem and S head trials were placed and the final components chosen. The 3 standard stem was inserted along with a S 28 mm head and 50 mm liner. The hip was reduced and was stable the wound was thoroughly irrigated with a dilute Betadine solution. The deep fascia view. Using a heavy Quill after infiltration of 30 cc of quarter percent Sensorcaine with epinephrine. 2-0 Quill to close the skin with skin staples Xeroform 4 x 4's ABDs and tape patient was sent to recovery in stable condition    PLAN OF CARE: Admit to inpatient

## 2015-05-23 NOTE — Anesthesia Procedure Notes (Signed)
Spinal Patient location during procedure: OR Start time: 05/23/2015 11:00 AM End time: 05/23/2015 10:50 AM Staffing Anesthesiologist: Martha Clan Resident/CRNA: Justus Memory Performed by: anesthesiologist  Preanesthetic Checklist Completed: patient identified, site marked, surgical consent, pre-op evaluation, timeout performed, IV checked, risks and benefits discussed and monitors and equipment checked Spinal Block Patient position: sitting Prep: ChloraPrep Patient monitoring: heart rate, continuous pulse ox, blood pressure and cardiac monitor Approach: midline Location: L4-5 Injection technique: single-shot Needle Needle type: Whitacre and Introducer  Needle gauge: 25 G Needle length: 9 cm Additional Notes Negative paresthesia. Negative blood return. Positive free-flowing CSF. Expiration date of kit checked and confirmed. Patient tolerated procedure well, without complications.

## 2015-05-24 MED ORDER — MAGNESIUM HYDROXIDE 400 MG/5ML PO SUSP
30.0000 mL | Freq: Every day | ORAL | Status: DC | PRN
  Administered 2015-05-24 – 2015-05-25 (×2): 30 mL via ORAL
  Filled 2015-05-24 (×3): qty 30

## 2015-05-24 MED ORDER — BISACODYL 10 MG RE SUPP
10.0000 mg | Freq: Every day | RECTAL | Status: DC | PRN
  Administered 2015-05-26: 10 mg via RECTAL
  Filled 2015-05-24: qty 1

## 2015-05-24 NOTE — Evaluation (Signed)
Occupational Therapy Evaluation Patient Details Name: [name] [name] MRN: [id number] DOB: 10/27/1923 Today's Date: 05/24/2015    History of Present Illness Pt is a 81 yo female who was admitted to the hospital s/p R THA anterior approach.  She lives at [facility] and helps take care of her husband who is disabled and has a prosthesis for LE.   Clinical Impression   Pt is 81year old woman s/p R THA(anterior)  who lives at [facility] with her husband. Pt was independent in all ADLs prior to surgery and driving and is eager to return to PLOF so she can assist her disabled husband again.  Pt is currently limited in functional ADLs due to pain, decreased ROM, and pain in lower back which was present prior to surgery.  Pt requires minimum assist for LB dressing (anticipate pants over hips will require moderate assist) and bathing skills due to pain and decreased AROM of R LE.  and would benefit from continued skilled OT services for education in assistive devices, functional mobility, and education in recommendations for home modifications to increase safety and prevent falls.  Pt is a good candidate for SNF to continue rehabilitation.       Follow Up Recommendations  SNF    Equipment Recommendations   (reacher and sock aid )    Recommendations for Other Services       Precautions / Restrictions Precautions Precautions: Anterior Hip;Fall Precaution Booklet Issued: No Restrictions Weight Bearing Restrictions: Yes RLE Weight Bearing: Weight bearing as tolerated      Mobility Bed Mobility Overal bed mobility: Needs Assistance Bed Mobility: Supine to Sit     Supine to sit: Mod assist     General bed mobility comments: Pt requiring slightly more assist this morning secondary to pain in back. She needs minor assist of trunk and LE management. Needs cues for use of side rails  Transfers Overall transfer level: Needs assistance Equipment used: Rolling walker (2  wheeled) Transfers: Sit to/from Stand Sit to Stand: Min assist         General transfer comment: Pt requires minor assist to get into standing as well as cues for appropriate use of hands. No instability or LOB    Balance Overall balance assessment: No apparent balance deficits (not formally assessed)                                          ADL Overall ADL's : Needs assistance/impaired                                       General ADL Comments: Pt currently requires min assist and cues for donning and doffing left sock using reacher and sock aid and deferred for RLE due to back pain and needing to use the commode.  She requires extra time during functional mobility with FWW with cues to keep good and balanced base of support and to stand up straight and sllow back muscles to relax. She has a fully adapted bathroom  at [facility] with grab bars by toilet and shower.      Vision     Perception     Praxis      Pertinent Vitals/Pain Pain Assessment: 0-10 Pain Score: 6  Pain Location: lower back; no pain in back  sitting or lying in bed but increases with transfer to commode seat  to 7-8/10 Pain Descriptors / Indicators: Aching;Constant Pain Intervention(s): Limited activity within patient's tolerance;Monitored during session;Premedicated before session;Repositioned     Hand Dominance Right   Extremity/Trunk Assessment Upper Extremity Assessment Upper Extremity Assessment: Overall WFL for tasks assessed   Lower Extremity Assessment Lower Extremity Assessment: Defer to PT evaluation       Communication Communication Communication: No difficulties   Cognition Arousal/Alertness: Awake/alert Behavior During Therapy: WFL for tasks assessed/performed Overall Cognitive Status: Within Functional Limits for tasks assessed                     General Comments       Exercises   Other Exercises Other Exercises: Pt performed  bilateral therex x 12 reps at min assist for facilitation of movement. Exercises included: ankle pumps, quad sets, glute sets, SAQ, SLR, and hip abd.    Shoulder Instructions      Home Living Family/patient expects to be discharged to:: Skilled nursing facility Living Arrangements: Spouse/significant other                               Additional Comments: Pt lives in town home at [place], level entry, no stairs.      Prior Functioning/Environment Level of Independence: Independent        Comments: Pt walking community distances and performing ADLs indep. Also driving car indep     OT Diagnosis: Generalized weakness;Acute pain   OT Problem List: Decreased strength;Decreased range of motion;Decreased activity tolerance;Pain   OT Treatment/Interventions: Self-care/ADL training;Therapeutic exercise;DME and/or AE instruction    OT Goals(Current goals can be found in the care plan section) Acute Rehab OT Goals Patient Stated Goal: "to get back to being independent since I have to help my disabled husband too" OT Goal Formulation: With patient/family Time For Goal Achievement: 06/07/15 Potential to Achieve Goals: Good  OT Frequency: Min 1X/week   Barriers to D/C:            Co-evaluation              End of Session Equipment Utilized During Treatment: Gait belt;Rolling walker (reacher and sock aid) Nurse Communication:  (pt unable to urinate after strong urge to go but just had catheter removed)  Activity Tolerance: Patient limited by pain Patient left: in bed;with call bell/phone within reach;with family/visitor present;with bed alarm set   Time: 1000-1040 OT Time Calculation (min): 40 min Charges:  OT General Charges $OT Visit: 1 Procedure OT Evaluation $Initial OT Evaluation Tier I: 1 Procedure OT Treatments $Self Care/Home Management : 23-37 mins G-Codes:    Harlon Ditty, OTR/L ascom [phone] 05/24/2015, 10:49  AM

## 2015-05-24 NOTE — Clinical Social Work Note (Signed)
Clinical Social Work Assessment  Patient Details  Name: [name] [name] MRN: [id number] Date of Birth: 09/13/1923  Date of referral:  05/24/15               Reason for consult:  Facility Placement, Other (Comment Required) (From [place] )                Permission sought to share information with:  Chartered certified accountant granted to share information::  Yes, Verbal Permission Granted  Name::      [place]::   [place]  Relationship::     Contact Information:     Housing/Transportation Living arrangements for the past 2 months:  [place] of Information:  Patient Patient Interpreter Needed:  None Criminal Activity/Legal Involvement Pertinent to Current Situation/Hospitalization:    Significant Relationships:  Adult Children, Spouse Lives with:  Spouse Do you feel safe going back to the place where you live?  Yes Need for family participation in patient care:  Yes (Comment)  Care giving concerns:  Patient is an independent living resident at [facility].   Social Worker assessment / plan: Holiday representative (CSW) received SNF consult. PT is recommending SNF. CSW met with patient alone at bedside. Patient was sitting up in the chair and was alert and oriented. CSW introduced self and explained role of CSW department. Patient reported that she lives in independent living at [facility] with her husband [name] [name]. Per patient she is the primary caregiver for her husband and he is in respite care at [facility] while she is at [facility]. Patient reported that she has 3 children and her daughter [name] [name] is her HPOA. CSW explained that PT is recommending SNF. Patient is agreeable to going to [facility] for rehab.   Plan is for patient to D/C to [facility] Sunday. Per Crystal admissions coordinator at [facility] patient is going to room 209. RN will call report at [phone]. D/C Summary was sent  today via carefinder. Patient is aware of above.      Employment status:  Disabled (Comment on whether or not currently receiving Disability), Retired Forensic scientist:  Medicare PT Recommendations:  [place] / Referral to community resources:  [place]  Patient/Family's Response to care: Patient is agreeable to going to [facility] for rehab.   Patient/Family's Understanding of and Emotional Response to Diagnosis, Current Treatment, and Prognosis: Patient was pleasant and thanked CSW for visit.   Emotional Assessment Appearance:  Appears stated age Attitude/Demeanor/Rapport:    Affect (typically observed):  Accepting, Adaptable, Pleasant Orientation:  Oriented to Self, Oriented to Place, Oriented to  Time, Oriented to Situation Alcohol / Substance use:  Not Applicable Psych involvement (Current and /or in the community):  No (Comment)  Discharge Needs  Concerns to be addressed:  Discharge Planning Concerns Readmission within the last 30 days:  No Current discharge risk:  Dependent with Mobility Barriers to Discharge:  Continued Medical Work up   Loralyn Freshwater, LCSW 05/24/2015, 10:10 AM

## 2015-05-24 NOTE — Progress Notes (Signed)
Physical Therapy Treatment Patient Details Name: [name] [name] MRN: [id number] DOB: March 03, 1924 Today's Date: 05/24/2015    History of Present Illness Pt is a 81 yo female who was admitted to the hospital s/p R THA anterior approach    PT Comments    Pt making gradual progress towards goals this morning, however she is slightly limited by fatigue. She has increased her ambulation distance but needs increased time and a break to complete ambulation distance. She also c/o back pain, which she states is not a new onset. She is very pleasant and willing to participate in therapy tasks and will continue to benefit from skilled PT in order to increase strength, ROM, and gait performance to return to premorbid state.   Follow Up Recommendations  SNF     Equipment Recommendations  Rolling walker with 5" wheels    Recommendations for Other Services       Precautions / Restrictions Precautions Precautions: Anterior Hip;Fall Precaution Booklet Issued: No Restrictions Weight Bearing Restrictions: Yes RLE Weight Bearing: Weight bearing as tolerated    Mobility  Bed Mobility Overal bed mobility: Needs Assistance Bed Mobility: Supine to Sit     Supine to sit: Mod assist     General bed mobility comments: Pt requiring slightly more assist this morning secondary to pain in back. She needs minor assist of trunk and LE management. Needs cues for use of side rails  Transfers Overall transfer level: Needs assistance Equipment used: Rolling walker (2 wheeled) Transfers: Sit to/from Stand Sit to Stand: Min assist         General transfer comment: Pt requires minor assist to get into standing as well as cues for appropriate use of hands. No instability or LOB  Ambulation/Gait Ambulation/Gait assistance: Min assist Ambulation Distance (Feet): 8 Feet Assistive device: Rolling walker (2 wheeled) Gait Pattern/deviations: Decreased step length - right;Step-to pattern;Decreased step  length - left;Decreased stride length;Shuffle;Antalgic Gait velocity: decreased Gait velocity interpretation: Below normal speed for age/gender General Gait Details: Pt still requiring assist for managing RW and cues for sequencing of RW with gait. Pt's movements are very slow and labored but she shows no instability, just increased time to perform tasks.  (Pt limited in distance secondary to fatigue. O2 sats at 95%)   Stairs            Wheelchair Mobility    Modified Rankin (Stroke Patients Only)       Balance Overall balance assessment: No apparent balance deficits (not formally assessed)                                  Cognition Arousal/Alertness: Awake/alert Behavior During Therapy: WFL for tasks assessed/performed Overall Cognitive Status: Within Functional Limits for tasks assessed                      Exercises Other Exercises Other Exercises: Pt performed bilateral therex x 12 reps at min assist for facilitation of movement. Exercises included: ankle pumps, quad sets, glute sets, SAQ, SLR, and hip abd.     General Comments        Pertinent Vitals/Pain Pain Assessment: 0-10 Pain Score: 5  Pain Location: Back; no pain stated in hip at rest. Pain in hip 8/10 with transfers Pain Intervention(s): Limited activity within patient's tolerance;Monitored during session;Premedicated before session;Ice applied    Home Living  Prior Function            PT Goals (current goals can now be found in the care plan section) Acute Rehab PT Goals Patient Stated Goal: to walk a bit before breakfast PT Goal Formulation: With patient Time For Goal Achievement: 06/06/15 Potential to Achieve Goals: Good Progress towards PT goals: Progressing toward goals    Frequency  BID    PT Plan Current plan remains appropriate    Co-evaluation             End of Session Equipment Utilized During Treatment: Gait  belt Activity Tolerance: Patient limited by fatigue;Patient tolerated treatment well Patient left: in chair;with call bell/phone within reach;with chair alarm set;with SCD's reapplied     Time: 7867-6720 PT Time Calculation (min) (ACUTE ONLY): 25 min  Charges:                       G CodesJanyth Contes 05-27-15, 9:24 AM  Janyth Contes, SPT. [phone]

## 2015-05-24 NOTE — Anesthesia Postprocedure Evaluation (Signed)
  Anesthesia Post-op Note  Patient: [name] [name]  Procedure(s) Performed: Procedure(s): TOTAL HIP ARTHROPLASTY ANTERIOR APPROACH (Right)  Anesthesia type:Spinal  Patient location: 136  Post pain: Pain level controlled  Post assessment: Post-op Vital signs reviewed, Patient's Cardiovascular Status Stable, Respiratory Function Stable, Patent Airway and No signs of Nausea or vomiting  Post vital signs: Reviewed and stable  Last Vitals:  Filed Vitals:   05/24/15 0443  BP: 95/57  Pulse: 79  Temp:   Resp:     Level of consciousness: awake, alert  and patient cooperative  Complications: No apparent anesthesia complications

## 2015-05-24 NOTE — Progress Notes (Signed)
   Subjective: 1 Day Post-Op Procedure(s) (LRB): TOTAL HIP ARTHROPLASTY ANTERIOR APPROACH (Right) Patient reports pain as 6 on 0-10 scale.   Patient is well, but has had some minor complaints of low back pain. History of low back pain/arthritis. We will start therapy today.  Plan is to go Rehab after hospital stay.  Objective: Vital signs in last 24 hours: Temp:  [96.9 F (36.1 C)-98.5 F (36.9 C)] 98.1 F (36.7 C) (10/21 0716) Pulse Rate:  [54-79] 78 (10/21 0716) Resp:  [14-19] 18 (10/21 0716) BP: (88-164)/(39-70) 101/39 mmHg (10/21 0716) SpO2:  [93 %-100 %] 93 % (10/21 0716) Weight:  [58.968 kg (130 lb)] 58.968 kg (130 lb) (10/20 0831)  Intake/Output from previous day: 10/20 0701 - 10/21 0700 In: 2683.8 [P.O.:750; I.V.:1833.8; IV Piggyback:100] Out: 1175 [Urine:975; Blood:200] Intake/Output this shift:     Recent Labs  05/23/15 1449  HGB 11.1*    Recent Labs  05/23/15 1449  WBC 10.6  RBC 3.52*  HCT 32.8*  PLT 226    Recent Labs  05/23/15 1449  CREATININE 0.61   No results for input(s): LABPT, INR in the last 72 hours.  EXAM General - Patient is Alert, Appropriate and Oriented Extremity - Neurovascular intact Sensation intact distally Intact pulses distally Dorsiflexion/Plantar flexion intact Dressing - dressing C/D/I and no drainage Motor Function - intact, moving foot and toes well on exam.   Past Medical History  Diagnosis Date  . Allergic rhinitis due to pollen     some shellfish also  . Osteoporosis   . Osteoarthritis, multiple sites   . Urge incontinence   . Hyperlipidemia   . Aortic sclerosis ([place]) 5/12    On echo. no stenosis  . Diastolic dysfunction     stress echo otherwise normal 1/04. Repeat 5/11 also negative  . History of seasonal allergies   . Arthritis   . Overactive bladder     Assessment/Plan:   1 Day Post-Op Procedure(s) (LRB): TOTAL HIP ARTHROPLASTY ANTERIOR APPROACH (Right) Active Problems:   Primary osteoarthritis  of right hip   Acute post op blood loss anemia    Estimated body mass index is 21.63 kg/(m^2) as calculated from the following:   Height as of this encounter: 5\' 5"  (1.651 m).   Weight as of this encounter: 58.968 kg (130 lb). Advance diet Up with therapy  Recheck labs in the am Needs BM Continue with heating pad to lower back.   DVT Prophylaxis - Lovenox Weight-Bearing as tolerated to right leg D/C O2 and Pulse OX and try on Room Air  T. Rachelle Hora, PA-C [place] 05/24/2015, 7:45 AM

## 2015-05-24 NOTE — Progress Notes (Addendum)
Pt has not voided. Since foley discontinued at 0530.bladder scan reveals 245cc. Pushing po fluids. rn spoke with chris gaines,pa. md orders in/out cath prn bladder scan>400. Neuro checks wnl. Dressing patent to right hip. Pt experiencing mid/low back pain with fair relief of norco. Minimal hip pain.md orders  Milk of magnesium 30cc qd prn and dulcolax supp 10mg   Pr qd prn

## 2015-05-24 NOTE — Progress Notes (Signed)
Physical Therapy Treatment Patient Details Name: GRETHEL ZENK MRN: [id number] DOB: 1923-09-11 Today's Date: 05/24/2015    History of Present Illness Pt is a 81 yo female who was admitted to the hospital s/p R THA anterior approach.  She lives at [facility] and helps take care of her husband who is disabled and has a prosthesis for LE.    PT Comments    Pt continues to be stiff, hesitant and pain limited but does show good effort.  She does not put weight through her R heel t/o most of ambulation and needs to focus heavily to flex hip and clear toes but ultimately is able to walk farther and spend more time on her feet than previous sessions.  Pt remains weak in the hip but is willing to keep working hard.  Follow Up Recommendations  SNF     Equipment Recommendations       Recommendations for Other Services       Precautions / Restrictions Precautions Precautions: Anterior Hip;Fall Restrictions Weight Bearing Restrictions: Yes RLE Weight Bearing: Weight bearing as tolerated    Mobility  Bed Mobility Overal bed mobility: Needs Assistance Bed Mobility: Supine to Sit;Sit to Supine     Supine to sit: Mod assist Sit to supine: Max assist   General bed mobility comments: Pt is able to move L LE, R LE however requires much assist getting in and out of bed  Transfers Overall transfer level: Needs assistance Equipment used: Rolling walker (2 wheeled) Transfers: Sit to/from Stand Sit to Stand: Min assist         General transfer comment: Pt needs much cuing for set up and hand/foot placement, but did get to standing w/o heavy assist, she does not fully bear weight on the R side  Ambulation/Gait Ambulation/Gait assistance: Min assist Ambulation Distance (Feet): 12 Feet Assistive device: Rolling walker (2 wheeled)       General Gait Details: Pt does much better with ambulation, but still has a very hard time both bearing much weight on the R as well as initiating  hip flexion to assist with toe clearance on the R   Stairs            Wheelchair Mobility    Modified Rankin (Stroke Patients Only)       Balance                                    Cognition Arousal/Alertness: Awake/alert Behavior During Therapy: WFL for tasks assessed/performed Overall Cognitive Status: Within Functional Limits for tasks assessed                      Exercises Total Joint Exercises Ankle Circles/Pumps: Strengthening;10 reps Quad Sets: Strengthening;15 reps Gluteal Sets: Strengthening;10 reps Short Arc Quad: AROM;10 reps Heel Slides: AAROM;10 reps Hip ABduction/ADduction: AAROM;10 reps    General Comments        Pertinent Vitals/Pain Pain Assessment: 0-10 Pain Score: 7  Pain Location: low back more than R hip    Home Living                      Prior Function            PT Goals (current goals can now be found in the care plan section) Progress towards PT goals: Progressing toward goals    Frequency  BID    PT  Plan Current plan remains appropriate    Co-evaluation             End of Session Equipment Utilized During Treatment: Gait belt Activity Tolerance: Patient limited by fatigue;Patient limited by pain Patient left: with bed alarm set;with family/visitor present     Time: 1324-1350 PT Time Calculation (min) (ACUTE ONLY): 26 min  Charges:  $Gait Training: 8-22 mins $Therapeutic Exercise: 8-22 mins                    G Codes:     Wayne Both, PT, DPT [phone]  Kreg Shropshire 05/24/2015, 3:14 PM

## 2015-05-24 NOTE — Clinical Social Work Placement (Signed)
   CLINICAL SOCIAL WORK PLACEMENT  NOTE  Date:  05/24/2015  Patient Details  Name: MARGARUITE TOP MRN: [id number] Date of Birth: 03/25/1924  Clinical Social Work is seeking post-discharge placement for this patient at the [place] level of care (*CSW will initial, date and re-position this form in  chart as items are completed):  Yes   Patient/family provided with [facility] Work Department's list of facilities offering this level of care within the geographic area requested by the patient (or if unable, by the patient's family).  Yes   Patient/family informed of their freedom to choose among providers that offer the needed level of care, that participate in Medicare, Medicaid or managed care program needed by the patient, have an available bed and are willing to accept the patient.  Yes   Patient/family informed of [facility]'s ownership interest in [facility] and [facility], as well as of the fact that they are under no obligation to receive care at these facilities.  PASRR submitted to EDS on 05/24/15     PASRR number received on 05/24/15     Existing PASRR number confirmed on       FL2 transmitted to all facilities in geographic area requested by pt/family on 05/24/15     FL2 transmitted to all facilities within larger geographic area on       Patient informed that his/her managed care company has contracts with or will negotiate with certain facilities, including the following:        Yes   Patient/family informed of bed offers received.  Patient chooses bed at  [facility] )     Physician recommends and patient chooses bed at      Patient to be transferred to   on  .  Patient to be transferred to facility by       Patient family notified on   of transfer.  Name of family member notified:        PHYSICIAN Please sign FL2     Additional Comment:    _______________________________________________ Loralyn Freshwater,  LCSW 05/24/2015, 10:08 AM

## 2015-05-24 NOTE — Care Management (Signed)
Patient is for SNF at [facility] per [facility]. RNCM will follow along with CSW.

## 2015-05-25 LAB — BASIC METABOLIC PANEL
ANION GAP: 5 (ref 5–15)
BUN: 15 mg/dL (ref 6–20)
CALCIUM: 7.9 mg/dL — AB (ref 8.9–10.3)
CHLORIDE: 103 mmol/L (ref 101–111)
CO2: 25 mmol/L (ref 22–32)
Creatinine, Ser: 0.52 mg/dL (ref 0.44–1.00)
GFR calc non Af Amer: >60 mL/min (ref >60)
Glucose, Bld: 132 mg/dL — ABNORMAL HIGH (ref 65–99)
Potassium: 3.5 mmol/L (ref 3.5–5.1)
SODIUM: 133 mmol/L — AB (ref 135–145)

## 2015-05-25 LAB — CBC
HEMATOCRIT: 27 % — AB (ref 35.0–47.0)
HEMOGLOBIN: 9 g/dL — AB (ref 12.0–16.0)
MCH: 31.2 pg (ref 26.0–34.0)
MCHC: 33.5 g/dL (ref 32.0–36.0)
MCV: 93.3 fL (ref 80.0–100.0)
Platelets: 198 10*3/uL (ref 150–440)
RBC: 2.89 MIL/uL — ABNORMAL LOW (ref 3.80–5.20)
RDW: 13.1 % (ref 11.5–14.5)
WBC: 9.7 10*3/uL (ref 3.6–11.0)

## 2015-05-25 MED ORDER — TAMSULOSIN HCL 0.4 MG PO CAPS
0.4000 mg | ORAL_CAPSULE | Freq: Every day | ORAL | Status: DC
  Administered 2015-05-25 – 2015-05-26 (×2): 0.4 mg via ORAL
  Filled 2015-05-25 (×2): qty 1

## 2015-05-25 MED ORDER — FLEET ENEMA 7-19 GM/118ML RE ENEM: 1.0000 | ENEMA | Freq: Every day | RECTAL | Status: DC | PRN

## 2015-05-25 MED ORDER — POTASSIUM CHLORIDE CRYS ER 20 MEQ PO TBCR
20.0000 meq | EXTENDED_RELEASE_TABLET | Freq: Three times a day (TID) | ORAL | Status: AC
  Administered 2015-05-25 (×3): 20 meq via ORAL
  Filled 2015-05-25 (×3): qty 1

## 2015-05-25 NOTE — Anesthesia Postprocedure Evaluation (Signed)
  Anesthesia Post-op Note  Patient: [name] [name]  Procedure(s) Performed: Procedure(s): TOTAL HIP ARTHROPLASTY ANTERIOR APPROACH (Right)  Anesthesia type:Spinal  Patient location: PACU  Post pain: Pain level controlled  Post assessment: Post-op Vital signs reviewed, Patient's Cardiovascular Status Stable, Respiratory Function Stable, Patent Airway and No signs of Nausea or vomiting  Post vital signs: Reviewed and stable  Last Vitals:  Filed Vitals:   05/25/15 0725  BP: 120/46  Pulse: 88  Temp: 37 C  Resp: 18    Level of consciousness: awake, alert  and patient cooperative  Complications: No apparent anesthesia complications

## 2015-05-25 NOTE — Progress Notes (Signed)
Physical Therapy Treatment Patient Details Name: [name] [name] MRN: [id number] DOB: [birth year] Today's Date: 05/25/2015    History of Present Illness Pt is a 81 yo female who was admitted to the hospital s/p R THA anterior approach.  She lives at [facility] and helps take care of her husband who is disabled and has a prosthesis for LE.    PT Comments    Pt shows marked improvement with ambulation today with true WBing and increased ability to increase step length with some consistency.  She continues to c/o typical post-op pain but was more willing to participate through this during exercises today. Pt making overall gains.   Follow Up Recommendations  SNF     Equipment Recommendations  Rolling walker with 5" wheels    Recommendations for Other Services       Precautions / Restrictions Precautions Precautions: Anterior Hip;Fall Restrictions Weight Bearing Restrictions: Yes RLE Weight Bearing: Weight bearing as tolerated    Mobility  Bed Mobility               General bed mobility comments: pt in recliner  Transfers Overall transfer level: Needs assistance Equipment used: Rolling walker (2 wheeled) Transfers: Sit to/from Stand Sit to Stand: Min assist         General transfer comment: Pt again needing a lot of cuing and encouragement, but is able to rise w/o excessive assist  Ambulation/Gait Ambulation/Gait assistance: Min assist Ambulation Distance (Feet): 20 Feet Assistive device: Rolling walker (2 wheeled)       General Gait Details: Chair following, pt initally very slow, shuffling gait.  With cuing and direct assist to advance walker she is able to step-through and increased step length.  She continues to need to really focus on lifting/advancing R LE but does much better with some actual WBing through the heel on the R.   Stairs            Wheelchair Mobility    Modified Rankin (Stroke Patients Only)       Balance                                     Cognition Arousal/Alertness: Awake/alert Behavior During Therapy: WFL for tasks assessed/performed Overall Cognitive Status: Within Functional Limits for tasks assessed                      Exercises Total Joint Exercises Ankle Circles/Pumps: AROM;10 reps Hip ABduction/ADduction: AROM;10 reps Long Arc Quad: AROM;10 reps Knee Flexion: AROM;10 reps Marching in Standing: Seated;10 reps;AAROM    General Comments        Pertinent Vitals/Pain Pain Assessment: 0-10 Pain Score: 7     Home Living                      Prior Function            PT Goals (current goals can now be found in the care plan section) Acute Rehab PT Goals Patient Stated Goal: "to get back to being independent since I have to help my disabled husband too" Progress towards PT goals: Progressing toward goals    Frequency  BID    PT Plan Current plan remains appropriate    Co-evaluation             End of Session Equipment Utilized During Treatment: Gait belt Activity Tolerance: Patient limited by  fatigue;Patient limited by pain Patient left: with chair alarm set;with family/visitor present     Time: 6484-7207 PT Time Calculation (min) (ACUTE ONLY): 28 min  Charges:  $Gait Training: 8-22 mins $Therapeutic Exercise: 8-22 mins                    G Codes:     Wayne Both, PT, DPT [phone]  Kreg Shropshire 05/25/2015, 12:34 PM

## 2015-05-25 NOTE — Progress Notes (Signed)
Physical Therapy Treatment Patient Details Name: [name] [name] MRN: [id number] DOB: January 02, 1924 Today's Date: 05/25/2015    History of Present Illness Pt is a 81 yo female who was admitted to the hospital s/p R THA anterior approach.  She lives at [facility] and helps take care of her husband who is disabled and has a prosthesis for LE.    PT Comments    Pt continues to work hard with PT, but does need constant cuing and encouragement.  She is able to ambulate with increased confidence and cadence today and generally continues to make good and consistent gains. Pt's daughter very involved and helpful.  Follow Up Recommendations  SNF     Equipment Recommendations  Rolling walker with 5" wheels    Recommendations for Other Services       Precautions / Restrictions Precautions Precautions: Anterior Hip;Fall Restrictions Weight Bearing Restrictions: Yes RLE Weight Bearing: Weight bearing as tolerated    Mobility  Bed Mobility Overal bed mobility: Needs Assistance Bed Mobility: Sit to Supine     Supine to sit: Mod assist     General bed mobility comments: Pt continues to need considerable assist to get back into bed  Transfers Overall transfer level: Needs assistance Equipment used: Rolling walker (2 wheeled) Transfers: Sit to/from Stand Sit to Stand: Min assist         General transfer comment: Pt shows good effort trying to get up, but is still unable to do so w/o at least min assist  Ambulation/Gait Ambulation/Gait assistance: Min assist Ambulation Distance (Feet): 45 Feet Assistive device: Rolling walker (2 wheeled)       General Gait Details: Pt becomes very fatigued with the effort of ambulation but is able to maintain a more consistent cadence this afternoon (though she still needs to deliberately concentrate on advancing R LE each step).  Chair following, ~3 standing rest breaks   Stairs            Wheelchair Mobility    Modified Rankin  (Stroke Patients Only)       Balance                                    Cognition Arousal/Alertness: Awake/alert Behavior During Therapy: WFL for tasks assessed/performed Overall Cognitive Status: Within Functional Limits for tasks assessed                      Exercises Total Joint Exercises Ankle Circles/Pumps: AROM;10 reps Quad Sets: Strengthening;15 reps Gluteal Sets: Strengthening;10 reps Short Arc Quad: AROM;10 reps Heel Slides: AAROM;10 reps Hip ABduction/ADduction: AROM;10 reps Long Arc Quad: AROM;10 reps Knee Flexion: AROM;10 reps Marching in Standing: Seated;10 reps;AAROM    General Comments        Pertinent Vitals/Pain Pain Assessment: 0-10 Pain Score: 5     Home Living                      Prior Function            PT Goals (current goals can now be found in the care plan section) Progress towards PT goals: Progressing toward goals    Frequency  BID    PT Plan Current plan remains appropriate    Co-evaluation             End of Session Equipment Utilized During Treatment: Gait belt Activity Tolerance: Patient limited by  fatigue;Patient limited by pain Patient left: with bed alarm set     Time: 1321-1345 PT Time Calculation (min) (ACUTE ONLY): 24 min  Charges:  $Gait Training: 8-22 mins $Therapeutic Exercise: 8-22 mins                    G Codes:     Wayne Both, PT, DPT [phone]  Kreg Shropshire 05/25/2015, 3:50 PM

## 2015-05-25 NOTE — Progress Notes (Signed)
Occupational Therapy Treatment Patient Details Name: [name] [name] MRN: [id number] DOB: [birth year] Today's Date: 05/25/2015    History of present illness Pt is a 81 yo female who was admitted to the hospital s/p R THA anterior approach.  She lives at [facility] and helps take care of her husband who is disabled and has a prosthesis for LE.   OT comments  Pt seen for ADL training with use of reacher, ted hose donner and dressing stick while sitting in chair with daughter in room with her and educated as well.  Mod cues and assist needed for ted hose donner and discussed where to purchase item and steps needed to use it. Pt used a reacher for LB dressing with mod cues and assist to achieve pants to thigh but declined standing to practice pants over hips due to increased back pain.  Pt is progressing well with OT and needs reminders on progress since she does not feel she is doing well.  Also practiced use of dressing stick compared to reacher for LB dressing.  Follow Up Recommendations  SNF    Equipment Recommendations   (reacher and sock aid and ted hose donner)    Recommendations for Other Services      Precautions / Restrictions Precautions Precautions: Anterior Hip;Fall Restrictions Weight Bearing Restrictions: Yes RLE Weight Bearing: Weight bearing as tolerated       Mobility Bed Mobility                  Transfers                      Balance                                   ADL                                         General ADL Comments: Pt seen in room with her daughter and already up in chair.  Demonstrated use of ted hose donner and needed moderate cues and assist to complete.  Dressing stick and reacher used for LB dressing for pants over feet and up to thigh but pt declined practice of pants over hips due to increased back pain.        Vision                     Perception     Praxis       Cognition                             Extremity/Trunk Assessment               Exercises     Shoulder Instructions       General Comments      Pertinent Vitals/ Pain          Home Living                                          Prior Functioning/Environment              Frequency Min 1X/week  Progress Toward Goals  OT Goals(current goals can now be found in the care plan section)  Progress towards OT goals: Progressing toward goals  Acute Rehab OT Goals Patient Stated Goal: "to get back to being independent since I have to help my disabled husband too" OT Goal Formulation: With patient/family Time For Goal Achievement: 05/25/15 Potential to Achieve Goals: Good  Plan Discharge plan remains appropriate    Co-evaluation                 End of Session Equipment Utilized During Treatment:  (reacher, sock aid, ted hose donner, dressing stick)   Activity Tolerance Patient limited by pain   Patient Left in bed;with call bell/phone within reach;with family/visitor present   Nurse Communication          Time: 1027-2536 OT Time Calculation (min): 30 min  Charges: OT General Charges $OT Visit: 1 Procedure OT Evaluation $Initial OT Evaluation Tier I: 1 Procedure OT Treatments $Self Care/Home Management : 8-22 mins  Wofford,Susan 05/25/2015, 11:37 AM   Chrys Racer, OTR/L ascom [phone]

## 2015-05-25 NOTE — Addendum Note (Signed)
Addendum  created 05/25/15 0962 by Kallie Edward, CRNA   Modules edited: Notes Section   Notes Section:  File: [id number]

## 2015-05-25 NOTE — Progress Notes (Signed)
  Subjective: 2 Days Post-Op Procedure(s) (LRB): TOTAL HIP ARTHROPLASTY ANTERIOR APPROACH (Right) Patient reports pain as mild.   Patient is well and has some minor complaints of low back pain as well as urinary retention. Plan is to go Skilled nursing facility after hospital stay. Negative for chest pain and shortness of breath Fever: no Gastrointestinal:Negative for nausea and vomiting  Objective: Vital signs in last 24 hours: Temp:  [97.7 F (36.5 C)-99.8 F (37.7 C)] 98.6 F (37 C) (10/22 0725) Pulse Rate:  [82-94] 88 (10/22 0725) Resp:  [18] 18 (10/22 0725) BP: (112-128)/(45-51) 120/46 mmHg (10/22 0725) SpO2:  [92 %-97 %] 92 % (10/22 0725)  Intake/Output from previous day:  Intake/Output Summary (Last 24 hours) at 05/25/15 0805 Last data filed at 05/25/15 0411  Gross per 24 hour  Intake    795 ml  Output    500 ml  Net    295 ml    Intake/Output this shift:    Labs:  Recent Labs  05/23/15 1449 05/25/15 0334  HGB 11.1* 9.0*    Recent Labs  05/23/15 1449 05/25/15 0334  WBC 10.6 9.7  RBC 3.52* 2.89*  HCT 32.8* 27.0*  PLT 226 198    Recent Labs  05/23/15 1449 05/25/15 0334  NA  --  133*  K  --  3.5  CL  --  103  CO2  --  25  BUN  --  15  CREATININE 0.61 0.52  GLUCOSE  --  132*  CALCIUM  --  7.9*   No results for input(s): LABPT, INR in the last 72 hours.   EXAM General - Patient is Alert, Appropriate and Oriented Extremity - Neurologically intact ABD soft Neurovascular intact Dorsiflexion/Plantar flexion intact Incision: dressing C/D/I No cellulitis present Dressing/Incision - clean, dry, no drainage Motor Function - intact, moving foot and toes well on exam.   Abdomen soft without tympany.  Normal BS.  Past Medical History  Diagnosis Date  . Allergic rhinitis due to pollen     some shellfish also  . Osteoporosis   . Osteoarthritis, multiple sites   . Urge incontinence   . Hyperlipidemia   . Aortic sclerosis ([place]) 5/12    On  echo. no stenosis  . Diastolic dysfunction     stress echo otherwise normal 1/04. Repeat 5/11 also negative  . History of seasonal allergies   . Arthritis   . Overactive bladder     Assessment/Plan: 2 Days Post-Op Procedure(s) (LRB): TOTAL HIP ARTHROPLASTY ANTERIOR APPROACH (Right) Active Problems:   Primary osteoarthritis of right hip  Estimated body mass index is 21.63 kg/(m^2) as calculated from the following:   Height as of this encounter: 5\' 5"  (1.651 m).   Weight as of this encounter: 58.968 kg (130 lb). Advance diet Up with therapy   Labs reviewed.  Na 133, spoke with the patient about fluid restriction.  Should increase now that patient off of IVF. BMP ordered for tomorrow AM. Pt off of IVF and tolerating PO intake. Pt having issues with urinary retention.  In and Out Cath ordered and Flomax added. Pt has not had a BM yet, Dulcolax and Fleet enema on board as needed for constipation.  Pt able to ambulate 12 feet with a walker yesterday with PT.  DVT Prophylaxis - Lovenox, Foot Pumps and TED hose Weight-Bearing as tolerated to right leg  J. Cameron Proud, PA-C [facility]opa[facility] 05/25/2015, 8:05 AM

## 2015-05-25 NOTE — Progress Notes (Signed)
PA Cameron Proud  Notified of lab values

## 2015-05-26 DIAGNOSIS — I5030 Unspecified diastolic (congestive) heart failure: Secondary | ICD-10-CM | POA: Diagnosis not present

## 2015-05-26 DIAGNOSIS — M6281 Muscle weakness (generalized): Secondary | ICD-10-CM | POA: Diagnosis not present

## 2015-05-26 DIAGNOSIS — K625 Hemorrhage of anus and rectum: Secondary | ICD-10-CM | POA: Diagnosis not present

## 2015-05-26 DIAGNOSIS — N3281 Overactive bladder: Secondary | ICD-10-CM | POA: Diagnosis not present

## 2015-05-26 DIAGNOSIS — Z471 Aftercare following joint replacement surgery: Secondary | ICD-10-CM | POA: Diagnosis not present

## 2015-05-26 DIAGNOSIS — I519 Heart disease, unspecified: Secondary | ICD-10-CM | POA: Diagnosis not present

## 2015-05-26 DIAGNOSIS — R2689 Other abnormalities of gait and mobility: Secondary | ICD-10-CM | POA: Diagnosis not present

## 2015-05-26 DIAGNOSIS — N3941 Urge incontinence: Secondary | ICD-10-CM | POA: Diagnosis not present

## 2015-05-26 DIAGNOSIS — M1990 Unspecified osteoarthritis, unspecified site: Secondary | ICD-10-CM | POA: Diagnosis not present

## 2015-05-26 DIAGNOSIS — F0151 Vascular dementia with behavioral disturbance: Secondary | ICD-10-CM | POA: Diagnosis not present

## 2015-05-26 DIAGNOSIS — Z96641 Presence of right artificial hip joint: Secondary | ICD-10-CM | POA: Diagnosis not present

## 2015-05-26 DIAGNOSIS — M1611 Unilateral primary osteoarthritis, right hip: Secondary | ICD-10-CM | POA: Diagnosis not present

## 2015-05-26 DIAGNOSIS — T148 Other injury of unspecified body region: Secondary | ICD-10-CM | POA: Diagnosis not present

## 2015-05-26 LAB — BASIC METABOLIC PANEL
Anion gap: 2 — ABNORMAL LOW (ref 5–15)
BUN: 14 mg/dL (ref 6–20)
CHLORIDE: 106 mmol/L (ref 101–111)
CO2: 25 mmol/L (ref 22–32)
Calcium: 7.8 mg/dL — ABNORMAL LOW (ref 8.9–10.3)
Creatinine, Ser: 0.5 mg/dL (ref 0.44–1.00)
GFR calc non Af Amer: >60 mL/min (ref >60)
Glucose, Bld: 110 mg/dL — ABNORMAL HIGH (ref 65–99)
POTASSIUM: 4.6 mmol/L (ref 3.5–5.1)
SODIUM: 133 mmol/L — AB (ref 135–145)

## 2015-05-26 MED ORDER — ENOXAPARIN SODIUM 40 MG/0.4ML ~~LOC~~ SOLN: 40.0000 mg | SUBCUTANEOUS | Status: DC

## 2015-05-26 MED ORDER — HYDROCODONE-ACETAMINOPHEN 5-325 MG PO TABS: 1.0000 | ORAL_TABLET | ORAL | Status: DC | PRN

## 2015-05-26 NOTE — Progress Notes (Signed)
Subjective: 3 Days Post-Op Procedure(s) (LRB): TOTAL HIP ARTHROPLASTY ANTERIOR APPROACH (Right) Patient reports pain as mild.  Pain is primarily in her lower back.  Does complain of mild pain in her right hip. Patient is well and has some minor complaints of low back pain. Plan is to go Skilled nursing facility after hospital stay, specifically [facility]. Negative for chest pain and shortness of breath Fever: no Gastrointestinal:Negative for nausea and vomiting  Objective: Vital signs in last 24 hours: Temp:  [98.2 F (36.8 C)-99.2 F (37.3 C)] 98.2 F (36.8 C) (10/23 0539) Pulse Rate:  [89-123] 98 (10/23 0724) Resp:  [16-22] 16 (10/23 0724) BP: (110-136)/(41-57) 132/57 mmHg (10/23 0724) SpO2:  [95 %-96 %] 95 % (10/23 0724)  Intake/Output from previous day:  Intake/Output Summary (Last 24 hours) at 05/26/15 0831 Last data filed at 05/26/15 0500  Gross per 24 hour  Intake      0 ml  Output   1750 ml  Net  -1750 ml    Intake/Output this shift:    Labs:  Recent Labs  05/23/15 1449 05/25/15 0334  HGB 11.1* 9.0*    Recent Labs  05/23/15 1449 05/25/15 0334  WBC 10.6 9.7  RBC 3.52* 2.89*  HCT 32.8* 27.0*  PLT 226 198    Recent Labs  05/25/15 0334 05/26/15 0313  NA 133* 133*  K 3.5 4.6  CL 103 106  CO2 25 25  BUN 15 14  CREATININE 0.52 0.50  GLUCOSE 132* 110*  CALCIUM 7.9* 7.8*   No results for input(s): LABPT, INR in the last 72 hours.   EXAM General - Patient is Alert, Appropriate and Oriented Extremity - Neurologically intact Neurovascular intact Dorsiflexion/Plantar flexion intact Incision: dressing C/D/I No cellulitis present Dressing/Incision - clean, dry, no drainage Motor Function - intact, moving foot and toes well on exam.   Abdomen slightly distended on exam, with tympany.  Pt has no pain with palpation, normal bowel sounds on auscultation.  Past Medical History  Diagnosis Date  . Allergic rhinitis due to pollen     some  shellfish also  . Osteoporosis   . Osteoarthritis, multiple sites   . Urge incontinence   . Hyperlipidemia   . Aortic sclerosis ([place]) 5/12    On echo. no stenosis  . Diastolic dysfunction     stress echo otherwise normal 1/04. Repeat 5/11 also negative  . History of seasonal allergies   . Arthritis   . Overactive bladder     Assessment/Plan: 3 Days Post-Op Procedure(s) (LRB): TOTAL HIP ARTHROPLASTY ANTERIOR APPROACH (Right) Active Problems:   Primary osteoarthritis of right hip  Estimated body mass index is 21.63 kg/(m^2) as calculated from the following:   Height as of this encounter: 5\' 5"  (1.651 m).   Weight as of this encounter: 58.968 kg (130 lb). Advance diet Up with therapy   Labs reviewed. Na stable at 133. K+ was 3.5 yesterday, supplementation given and K+ 4.6 this AM. Pt off of IVF and tolerating PO intake. Pt urinating without a Foley today.   Pt has not had a BM yet, Dulcolax and Fleet enema on board as needed for constipation.  Pt able to ambulate 45 feet with a walker yesterday with PT. Plan on D/C to SNF today pending bowel movement.  Staples to be removed in 10-14 days. Follow-up with Dr. Rudene Christians with [facility] in 6 weeks for evaluation and x-rays.  DVT Prophylaxis - Lovenox, Foot Pumps and TED hose Weight-Bearing as tolerated to  right leg  J. Cameron Proud, PA-C [facility] 05/26/2015, 8:31 AM

## 2015-05-26 NOTE — Progress Notes (Signed)
Patient is medically stable for D/C to [facility] today. Patient is going to room 209. D/C Summary was sent Friday. RN Arts development officer at [facility] reported that patient can come today. RN will call report at [phone] and arrange EMS for transport. Clinical Education officer, museum (CSW) prepared D/C packet. Patient is aware of above. CSW contacted patient's daughter [name] and made her aware of above. Please reconsult if future social work needs arise. CSW signing off.  Blima Rich, [place] [phone]

## 2015-05-26 NOTE — Discharge Summary (Signed)
Physician Discharge Summary  Patient ID: [name] [name] MRN: [id number] DOB/AGE: 79-12-1923 79 y.o.  Admit date: 05/23/2015 Discharge date: 05/26/2015  Admission Diagnoses:  osteoarthritis Primary osteoarthritis right hip  Discharge Diagnoses: Patient Active Problem List   Diagnosis Date Noted  . Primary osteoarthritis of right hip 05/23/2015  . Left hip pain 05/07/2015  . Rectal bleeding 01/15/2015  . Advanced directives, counseling/discussion 02/07/2014  . Routine general medical examination at a health care facility 02/07/2014  . Allergic rhinitis due to pollen   . Osteoporosis   . Osteoarthritis, multiple sites   . Urge incontinence   . Hyperlipidemia   . Diastolic dysfunction     Past Medical History  Diagnosis Date  . Allergic rhinitis due to pollen     some shellfish also  . Osteoporosis   . Osteoarthritis, multiple sites   . Urge incontinence   . Hyperlipidemia   . Aortic sclerosis ([place]) 5/12    On echo. no stenosis  . Diastolic dysfunction     stress echo otherwise normal 1/04. Repeat 5/11 also negative  . History of seasonal allergies   . Arthritis   . Overactive bladder      Transfusion: None   Consultants (if any):  None  Discharged Condition: Improved  Hospital Course: [name] [name] is an 81 y.o. female who was admitted 05/23/2015 with a diagnosis of primary osteoarthritis, right hip and went to the operating room on 05/23/2015 and underwent the above named procedures.    Surgeries: Procedure(s): TOTAL HIP ARTHROPLASTY ANTERIOR APPROACH on 05/23/2015 Patient tolerated the surgery well. Taken to PACU where she was stabilized and then transferred to the orthopedic floor.  Started on Lovenox 40mg  q 24 hrs. Foot pumps applied bilaterally at 80 mm. Heels elevated on bed with rolled towels. No evidence of DVT. Negative Homan. Physical therapy started on day #1 for gait training and transfer. OT started day #1 for ADL and assisted  devices.  Patient's IV and Foley were d/c on POD1.  In and Out Cath performed for acute urinary retention during hospital stay.  Implants: Medacta AMIS size 3 collared stem, 50 mm Mpact DM with liner and S 28 mm head  She was given perioperative antibiotics:  Anti-infectives    Start     Dose/Rate Route Frequency Ordered Stop   05/23/15 1400  ceFAZolin (ANCEF) IVPB 1 g/50 mL premix     1 g 100 mL/hr over 30 Minutes Intravenous Every 6 hours 05/23/15 1359 05/24/15 0230   05/23/15 0845  ceFAZolin (ANCEF) IVPB 1 g/50 mL premix  Status:  Discontinued     1 g 100 mL/hr over 30 Minutes Intravenous  Once 05/23/15 0835 05/23/15 1356   05/23/15 0722  ceFAZolin (ANCEF) 1-5 GM-% IVPB    Comments:  Ronnell Freshwater: cabinet override      05/23/15 0722 05/23/15 1117    .  She was given sequential compression devices, early ambulation, and Lovenox for DVT prophylaxis.  She benefited maximally from the hospital stay and there were no complications.    Recent vital signs:  Filed Vitals:   05/26/15 0724  BP: 132/57  Pulse: 98  Temp:   Resp: 16    Recent laboratory studies:  Lab Results  Component Value Date   HGB 9.0* 05/25/2015   HGB 11.1* 05/23/2015   HGB 12.3 05/15/2015   Lab Results  Component Value Date   WBC 9.7 05/25/2015   PLT 198 05/25/2015   Lab Results  Component Value Date  INR 0.98 05/15/2015   Lab Results  Component Value Date   NA 133* 05/26/2015   K 4.6 05/26/2015   CL 106 05/26/2015   CO2 25 05/26/2015   BUN 14 05/26/2015   CREATININE 0.50 05/26/2015   GLUCOSE 110* 05/26/2015    Discharge Medications:     Medication List    TAKE these medications        acetaminophen 650 MG CR tablet  Commonly known as:  TYLENOL  Take 650 mg by mouth 3 (three) times daily.     Acidophilus 100 MG Caps  Take by mouth.     ALLERGY MED PO  Take 1 tablet by mouth daily.     aspirin 81 MG tablet  Take 81 mg by mouth daily.     celecoxib 200 MG capsule   Commonly known as:  CELEBREX  Take 1 capsule (200 mg total) by mouth daily.     DETROL LA 4 MG 24 hr capsule  Generic drug:  tolterodine  TAKE 1 CAPSULE DAILY     enoxaparin 40 MG/0.4ML injection  Commonly known as:  LOVENOX  Inject 0.4 mLs (40 mg total) into the skin daily.     GAS-X PO  Take by mouth 2 (two) times daily.     HYDROcodone-acetaminophen 5-325 MG tablet  Commonly known as:  NORCO/VICODIN  Take 1-2 tablets by mouth every 4 (four) hours as needed (breakthrough pain).     hydrocortisone 2.5 % cream  Apply topically 3 (three) times daily as needed.     multivitamin tablet  Take 1 tablet by mouth daily.     polyethylene glycol packet  Commonly known as:  MIRALAX / GLYCOLAX  Take 17 g by mouth daily as needed.     PROBIOTIC ADVANCED PO  Take 1 capsule by mouth daily.     traMADol 50 MG tablet  Commonly known as:  ULTRAM  Take 1 tablet (50 mg total) by mouth 3 (three) times daily as needed.     Vitamin D-3 1000 UNITS Caps  Take by mouth daily.        Diagnostic Studies: Dg Hip Operative Unilat With Pelvis Right  05/23/2015  CLINICAL DATA:  Right hip replacement EXAM: OPERATIVE RIGHT HIP (WITH PELVIS IF PERFORMED) 2 VIEWS TECHNIQUE: Fluoroscopic spot image(s) were submitted for interpretation post-operatively. COMPARISON:  None. FINDINGS: Femoral and acetabular components of the total hip replacement on the right are seen in anticipated position. Fluoro time:  0 minutes 26 seconds IMPRESSION: Anticipated postoperative appearance Electronically Signed   By: Skipper Cliche M.D.   On: 05/23/2015 12:45   Dg Hip Unilat W Or W/o Pelvis 2-3 Views Right  05/23/2015  CLINICAL DATA:  Status post right hip replacement. EXAM: DG HIP (WITH OR WITHOUT PELVIS) 2-3V RIGHT COMPARISON:  None. FINDINGS: The femoral and acetabular components appear to be well situated. No fracture or dislocation is noted. Expected postoperative changes are seen in the surrounding soft tissues.  IMPRESSION: Status post right total hip replacement. Electronically Signed   By: Marijo Conception, M.D.   On: 05/23/2015 13:25    Disposition: Pt is stable and ready for discharge pending a bowel movement.  Pt is POD3 following a total hip arthroplasty using the anterior approach.  Pt has improved continuously since her admission, her IVF was d/c on POD1 and Foley was d/c on POD1.  Pt suffered from acute urinary retention with in and out cath and Flomax.  She is able to urinate today  without the foley.  Pt is ambulating with PT 45 feet with the assistance of a walker.  Pt will be going to [facility] for rehab.  Plan is for discharge today pending a bowel movement following physical therapy.  She will be discharged with lovenox for DVT prophylaxis and hydrocodone-acetaminophen for pain control.  Staples to be removed in 10-14 days and follow-up with Dr. Rudene Christians in 6 weeks for repeat x-rays of right hip.       Follow-up Information    Follow up with GAINES, THOMAS CHRISTOPHER, PA-C In 14 days.   Specialties:  Orthopedic Surgery, Emergency Medicine   Why:  For staple removal, For wound re-check   Contact information:   [place] [address] [phone]       Follow up with MENZ,MICHAEL, MD In 6 weeks.   Specialty:  Orthopedic Surgery   Why:  For repeat x-rays and evaluation.   Contact information:   [place] [address] [phone]      Signed: Judson Roch PA-C 05/26/2015, 8:43 AM

## 2015-05-26 NOTE — Progress Notes (Signed)
Report called to [facility]  Questions answered, pt to be transported via ems

## 2015-05-26 NOTE — Clinical Social Work Placement (Signed)
   CLINICAL SOCIAL WORK PLACEMENT  NOTE  Date:  05/26/2015  Patient Details  Name: [name] [name] MRN: [id number] Date of Birth: 02-27-1924  Clinical Social Work is seeking post-discharge placement for this patient at the [place] level of care (*CSW will initial, date and re-position this form in  chart as items are completed):  Yes   Patient/family provided with [facility] Work Department's list of facilities offering this level of care within the geographic area requested by the patient (or if unable, by the patient's family).  Yes   Patient/family informed of their freedom to choose among providers that offer the needed level of care, that participate in Medicare, Medicaid or managed care program needed by the patient, have an available bed and are willing to accept the patient.  Yes   Patient/family informed of [facility]'s ownership interest in [facility] and [facility], as well as of the fact that they are under no obligation to receive care at these facilities.  PASRR submitted to EDS on 05/24/15     PASRR number received on 05/24/15     Existing PASRR number confirmed on       FL2 transmitted to all facilities in geographic area requested by pt/family on 05/24/15     FL2 transmitted to all facilities within larger geographic area on       Patient informed that his/her managed care company has contracts with or will negotiate with certain facilities, including the following:        Yes   Patient/family informed of bed offers received.  Patient chooses bed at  [facility] )     Physician recommends and patient chooses bed at      Patient to be transferred to  [facility] ) on 05/26/15.  Patient to be transferred to facility by  [facility] )     Patient family notified on 05/26/15 of transfer.  Name of family member notified:   (Daughter [name] is aware of D/C today. )     PHYSICIAN       Additional  Comment:    _______________________________________________ Loralyn Freshwater, LCSW 05/26/2015, 9:42 AM

## 2015-05-26 NOTE — Progress Notes (Signed)
Physical Therapy Treatment Patient Details Name: [name] [name] MRN: [id number] DOB: 05/15/1924 Today's Date: 05/26/2015    History of Present Illness Pt is a 81 yo female who was admitted to the hospital s/p R THA anterior approach.  She lives at [facility] and helps take care of her husband who is disabled and has a prosthesis for LE.    PT Comments    Pt is able to participate well with PT and though she has fatigue and pain she shows good effort.  She does still need assist to rise from sitting and much cuing/encouragement during ambulation.  She continues to make consistent improvement and generally does well.   Follow Up Recommendations  SNF     Equipment Recommendations  Rolling walker with 5" wheels    Recommendations for Other Services       Precautions / Restrictions Precautions Precautions: Anterior Hip;Fall Restrictions Weight Bearing Restrictions: Yes RLE Weight Bearing: Weight bearing as tolerated    Mobility  Bed Mobility               General bed mobility comments: Pt in recliner on arrival, no bed mobility  Transfers Overall transfer level: Needs assistance Equipment used: Rolling walker (2 wheeled) Transfers: Sit to/from Stand Sit to Stand: Min assist         General transfer comment: Again pt needing a good amount of cuing and encouragement for positioning/set up.  Ambulation/Gait Ambulation/Gait assistance: Min assist Ambulation Distance (Feet): 45 Feet Assistive device: Rolling walker (2 wheeled)       General Gait Details: Pt continues to be slow and deliberate but is able to maintain more consistent gait.  She does not need any standing rest breaks, but again is very fatigued with the effort.    Stairs            Wheelchair Mobility    Modified Rankin (Stroke Patients Only)       Balance                                    Cognition Arousal/Alertness: Awake/alert Behavior During Therapy: WFL for  tasks assessed/performed Overall Cognitive Status: Within Functional Limits for tasks assessed                      Exercises Total Joint Exercises Ankle Circles/Pumps: AROM;10 reps Quad Sets: Strengthening;15 reps Gluteal Sets: Strengthening;10 reps Hip ABduction/ADduction: AROM;10 reps Long Arc Quad: AROM;10 reps Marching in Standing: Seated;10 reps;AAROM    General Comments        Pertinent Vitals/Pain Pain Score: 3  (increases with activity)    Home Living                      Prior Function            PT Goals (current goals can now be found in the care plan section) Progress towards PT goals: Progressing toward goals    Frequency  BID    PT Plan Current plan remains appropriate    Co-evaluation             End of Session Equipment Utilized During Treatment: Gait belt Activity Tolerance: Patient limited by fatigue Patient left: with chair alarm set     Time: 2263-3354 PT Time Calculation (min) (ACUTE ONLY): 32 min  Charges:  $Gait Training: 8-22 mins $Therapeutic Exercise: 8-22 mins  G Codes:     Wayne Both, PT, DPT [phone]  Kreg Shropshire 05/26/2015, 10:28 AM

## 2015-05-26 NOTE — Progress Notes (Signed)
Pt is 2 assist to bedside commode for voiding and then back to bed

## 2015-05-26 NOTE — Progress Notes (Signed)
1445 pt discharged via EMS with belongings

## 2015-05-26 NOTE — Discharge Instructions (Signed)

## 2015-05-27 DIAGNOSIS — N3941 Urge incontinence: Secondary | ICD-10-CM | POA: Diagnosis not present

## 2015-05-27 DIAGNOSIS — M1611 Unilateral primary osteoarthritis, right hip: Secondary | ICD-10-CM | POA: Diagnosis not present

## 2015-05-27 DIAGNOSIS — I519 Heart disease, unspecified: Secondary | ICD-10-CM | POA: Diagnosis not present

## 2015-05-27 LAB — SURGICAL PATHOLOGY

## 2015-06-10 DIAGNOSIS — K625 Hemorrhage of anus and rectum: Secondary | ICD-10-CM | POA: Diagnosis not present

## 2015-06-10 DIAGNOSIS — I519 Heart disease, unspecified: Secondary | ICD-10-CM | POA: Diagnosis not present

## 2015-06-10 DIAGNOSIS — Z96641 Presence of right artificial hip joint: Secondary | ICD-10-CM | POA: Diagnosis not present

## 2015-06-10 DIAGNOSIS — F22 Delusional disorders: Secondary | ICD-10-CM

## 2015-06-10 DIAGNOSIS — F0151 Vascular dementia with behavioral disturbance: Secondary | ICD-10-CM | POA: Diagnosis not present

## 2015-06-12 ENCOUNTER — Other Ambulatory Visit: Payer: Self-pay | Admitting: *Deleted

## 2015-06-12 MED ORDER — HYDROCODONE-ACETAMINOPHEN 5-325 MG PO TABS: 1.0000 | ORAL_TABLET | ORAL | Status: DC | PRN

## 2015-06-12 NOTE — Telephone Encounter (Signed)
Per verbal from Dr. Silvio Pate ok to fill for #30 because pt just got refill 05/26/15, Dr. Silvio Pate will hand deliver to pt

## 2015-06-14 DIAGNOSIS — M6281 Muscle weakness (generalized): Secondary | ICD-10-CM | POA: Diagnosis not present

## 2015-06-17 DIAGNOSIS — M6281 Muscle weakness (generalized): Secondary | ICD-10-CM | POA: Diagnosis not present

## 2015-06-18 DIAGNOSIS — J3081 Allergic rhinitis due to animal (cat) (dog) hair and dander: Secondary | ICD-10-CM | POA: Diagnosis not present

## 2015-06-18 DIAGNOSIS — M6281 Muscle weakness (generalized): Secondary | ICD-10-CM | POA: Diagnosis not present

## 2015-06-18 DIAGNOSIS — J3089 Other allergic rhinitis: Secondary | ICD-10-CM | POA: Diagnosis not present

## 2015-06-18 DIAGNOSIS — J301 Allergic rhinitis due to pollen: Secondary | ICD-10-CM | POA: Diagnosis not present

## 2015-06-20 DIAGNOSIS — M6281 Muscle weakness (generalized): Secondary | ICD-10-CM | POA: Diagnosis not present

## 2015-06-24 ENCOUNTER — Ambulatory Visit (INDEPENDENT_AMBULATORY_CARE_PROVIDER_SITE_OTHER): Payer: Medicare Other | Admitting: Internal Medicine

## 2015-06-24 ENCOUNTER — Encounter: Payer: Self-pay | Admitting: Internal Medicine

## 2015-06-24 VITALS — BP 128/70 | HR 86 | Temp 97.4°F | Wt 132.0 lb

## 2015-06-24 DIAGNOSIS — M1611 Unilateral primary osteoarthritis, right hip: Secondary | ICD-10-CM | POA: Diagnosis not present

## 2015-06-24 DIAGNOSIS — M6281 Muscle weakness (generalized): Secondary | ICD-10-CM | POA: Diagnosis not present

## 2015-06-24 NOTE — Progress Notes (Signed)
Subjective:    Patient ID: [name] [name], female    DOB: 1924-07-25, 81 y.o.   MRN: [id number]  HPI Here for for follow up from her hip replacement Goes back to Dr Rudene Christians next week  Getting PT and OT at home--- will be ending OT soon Husband was in respite and is home now also Still with AM help daily--even weekends. Now with 2 hours at night as well.  Not needing the narcotic pain pills Getting around with rolling walker--but not using in house Showers alone--needs someone to help her with pants. Hard to bend down Sick of the compression hose--- has been 1 month now  Current Outpatient Prescriptions on File Prior to Visit  Medication Sig Dispense Refill  . acetaminophen (TYLENOL) 650 MG CR tablet Take 650 mg by mouth 3 (three) times daily.    Marland Kitchen aspirin 81 MG tablet Take 81 mg by mouth daily.    . celecoxib (CELEBREX) 200 MG capsule Take 1 capsule (200 mg total) by mouth daily. 90 capsule 3  . Cholecalciferol (VITAMIN D-3) 1000 UNITS CAPS Take by mouth daily.    Marland Kitchen DETROL LA 4 MG 24 hr capsule TAKE 1 CAPSULE DAILY 90 capsule 3  . DiphenhydrAMINE HCl (ALLERGY MED PO) Take 1 tablet by mouth daily.    Marland Kitchen HYDROcodone-acetaminophen (NORCO/VICODIN) 5-325 MG tablet Take 1-2 tablets by mouth every 4 (four) hours as needed (breakthrough pain). 30 tablet 0  . hydrocortisone 2.5 % cream Apply topically 3 (three) times daily as needed. 28 g 3  . Lactobacillus (ACIDOPHILUS) 100 MG CAPS Take by mouth.    . Multiple Vitamin (MULTIVITAMIN) tablet Take 1 tablet by mouth daily.    . polyethylene glycol (MIRALAX / GLYCOLAX) packet Take 17 g by mouth daily as needed.    . Probiotic Product (PROBIOTIC ADVANCED PO) Take 1 capsule by mouth daily.    . Simethicone (GAS-X PO) Take by mouth 2 (two) times daily.    . traMADol (ULTRAM) 50 MG tablet Take 1 tablet (50 mg total) by mouth 3 (three) times daily as needed. 60 tablet 0   No current facility-administered medications on file prior to visit.     Allergies  Allergen Reactions  . Demerol [Meperidine]     dizzy  . Shellfish Allergy     Past Medical History  Diagnosis Date  . Allergic rhinitis due to pollen     some shellfish also  . Osteoporosis   . Osteoarthritis, multiple sites   . Urge incontinence   . Hyperlipidemia   . Aortic sclerosis ([place]) 5/12    On echo. no stenosis  . Diastolic dysfunction     stress echo otherwise normal 1/04. Repeat 5/11 also negative  . History of seasonal allergies   . Arthritis   . Overactive bladder     Past Surgical History  Procedure Laterality Date  . Inguinal hernia repair  2012    left side with mesh  . Umbilical hernia repair  02/04/06  . Cystocele repair  2007    and rectocele  . Tonsillectomy and adenoidectomy      as child  . Dilation and curettage of uterus    . Total hip arthroplasty Right 05/23/2015    Procedure: TOTAL HIP ARTHROPLASTY ANTERIOR APPROACH;  Surgeon: Hessie Knows, MD;  Location: [facility] ORS;  Service: Orthopedics;  Laterality: Right;    Family History  Problem Relation Age of Onset  . Cancer Father 53    colon cancer  . Heart  disease Father     Social History   Social History  . Marital Status: Married    Spouse Name: N/A  . Number of Children: 3  . Years of Education: N/A   Occupational History  . 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 Smoking status: Former Research scientist (life sciences)  . Smokeless tobacco: Never Used  . Alcohol Use: Yes     Comment: wine before dinner  . Drug Use: No  . Sexual Activity: Not on file   Other Topics Concern  . Not on file   Social History Narrative   Has living will   Husband then daughter [name], is health care POA   Would accept trial of resuscitation   Would probably accept feeding tube   Review of Systems  No chest pain No SOB Appetite is fine Sleeping okay     Objective:   Physical Exam  Constitutional: She appears well-developed and  well-nourished. No distress.  Neck: Normal range of motion. Neck supple. No thyromegaly present.  Cardiovascular: Normal rate, regular rhythm and normal heart sounds.  Exam reveals no gallop.   No murmur heard. Pulmonary/Chest: Effort normal and breath sounds normal. No respiratory distress. She has no wheezes. She has no rales.  Musculoskeletal:  No calf swelling or tenderness  Lymphadenopathy:    She has no cervical adenopathy.  Psychiatric: She has a normal mood and affect. Her behavior is normal.          Assessment & Plan:

## 2015-06-24 NOTE — Assessment & Plan Note (Signed)
Did well with surgery and home from rehab Has appropriate help to care for husband Okay to stop support hose Pain not an issue

## 2015-06-24 NOTE — Progress Notes (Signed)
Pre visit review using our clinic review tool, if applicable. No additional management support is needed unless otherwise documented below in the visit note. 

## 2015-06-25 DIAGNOSIS — M6281 Muscle weakness (generalized): Secondary | ICD-10-CM | POA: Diagnosis not present

## 2015-06-25 DIAGNOSIS — J3089 Other allergic rhinitis: Secondary | ICD-10-CM | POA: Diagnosis not present

## 2015-06-25 DIAGNOSIS — J301 Allergic rhinitis due to pollen: Secondary | ICD-10-CM | POA: Diagnosis not present

## 2015-06-25 DIAGNOSIS — J3081 Allergic rhinitis due to animal (cat) (dog) hair and dander: Secondary | ICD-10-CM | POA: Diagnosis not present

## 2015-06-26 DIAGNOSIS — M6281 Muscle weakness (generalized): Secondary | ICD-10-CM | POA: Diagnosis not present

## 2015-07-01 DIAGNOSIS — M6281 Muscle weakness (generalized): Secondary | ICD-10-CM | POA: Diagnosis not present

## 2015-07-02 DIAGNOSIS — J3081 Allergic rhinitis due to animal (cat) (dog) hair and dander: Secondary | ICD-10-CM | POA: Diagnosis not present

## 2015-07-02 DIAGNOSIS — J301 Allergic rhinitis due to pollen: Secondary | ICD-10-CM | POA: Diagnosis not present

## 2015-07-02 DIAGNOSIS — J3089 Other allergic rhinitis: Secondary | ICD-10-CM | POA: Diagnosis not present

## 2015-07-03 DIAGNOSIS — M6281 Muscle weakness (generalized): Secondary | ICD-10-CM | POA: Diagnosis not present

## 2015-07-05 DIAGNOSIS — M6281 Muscle weakness (generalized): Secondary | ICD-10-CM | POA: Diagnosis not present

## 2015-07-05 DIAGNOSIS — Z96641 Presence of right artificial hip joint: Secondary | ICD-10-CM | POA: Diagnosis not present

## 2015-07-05 DIAGNOSIS — Z471 Aftercare following joint replacement surgery: Secondary | ICD-10-CM | POA: Diagnosis not present

## 2015-07-09 DIAGNOSIS — J3089 Other allergic rhinitis: Secondary | ICD-10-CM | POA: Diagnosis not present

## 2015-07-09 DIAGNOSIS — J301 Allergic rhinitis due to pollen: Secondary | ICD-10-CM | POA: Diagnosis not present

## 2015-07-09 DIAGNOSIS — J3081 Allergic rhinitis due to animal (cat) (dog) hair and dander: Secondary | ICD-10-CM | POA: Diagnosis not present

## 2015-07-10 DIAGNOSIS — Z471 Aftercare following joint replacement surgery: Secondary | ICD-10-CM | POA: Diagnosis not present

## 2015-07-10 DIAGNOSIS — H401121 Primary open-angle glaucoma, left eye, mild stage: Secondary | ICD-10-CM | POA: Diagnosis not present

## 2015-07-10 DIAGNOSIS — M6281 Muscle weakness (generalized): Secondary | ICD-10-CM | POA: Diagnosis not present

## 2015-07-11 DIAGNOSIS — Z471 Aftercare following joint replacement surgery: Secondary | ICD-10-CM | POA: Diagnosis not present

## 2015-07-11 DIAGNOSIS — M6281 Muscle weakness (generalized): Secondary | ICD-10-CM | POA: Diagnosis not present

## 2015-07-12 ENCOUNTER — Other Ambulatory Visit: Payer: Self-pay | Admitting: Internal Medicine

## 2015-07-12 DIAGNOSIS — M6281 Muscle weakness (generalized): Secondary | ICD-10-CM | POA: Diagnosis not present

## 2015-07-12 DIAGNOSIS — Z471 Aftercare following joint replacement surgery: Secondary | ICD-10-CM | POA: Diagnosis not present

## 2015-07-16 DIAGNOSIS — J301 Allergic rhinitis due to pollen: Secondary | ICD-10-CM | POA: Diagnosis not present

## 2015-07-16 DIAGNOSIS — J3081 Allergic rhinitis due to animal (cat) (dog) hair and dander: Secondary | ICD-10-CM | POA: Diagnosis not present

## 2015-07-16 DIAGNOSIS — J3089 Other allergic rhinitis: Secondary | ICD-10-CM | POA: Diagnosis not present

## 2015-07-23 DIAGNOSIS — J3081 Allergic rhinitis due to animal (cat) (dog) hair and dander: Secondary | ICD-10-CM | POA: Diagnosis not present

## 2015-07-23 DIAGNOSIS — J301 Allergic rhinitis due to pollen: Secondary | ICD-10-CM | POA: Diagnosis not present

## 2015-07-23 DIAGNOSIS — J3089 Other allergic rhinitis: Secondary | ICD-10-CM | POA: Diagnosis not present

## 2015-07-30 DIAGNOSIS — J3089 Other allergic rhinitis: Secondary | ICD-10-CM | POA: Diagnosis not present

## 2015-07-30 DIAGNOSIS — J301 Allergic rhinitis due to pollen: Secondary | ICD-10-CM | POA: Diagnosis not present

## 2015-07-30 DIAGNOSIS — J3081 Allergic rhinitis due to animal (cat) (dog) hair and dander: Secondary | ICD-10-CM | POA: Diagnosis not present

## 2015-08-06 DIAGNOSIS — J3081 Allergic rhinitis due to animal (cat) (dog) hair and dander: Secondary | ICD-10-CM | POA: Diagnosis not present

## 2015-08-06 DIAGNOSIS — J3089 Other allergic rhinitis: Secondary | ICD-10-CM | POA: Diagnosis not present

## 2015-08-06 DIAGNOSIS — J301 Allergic rhinitis due to pollen: Secondary | ICD-10-CM | POA: Diagnosis not present

## 2015-08-13 DIAGNOSIS — J3089 Other allergic rhinitis: Secondary | ICD-10-CM | POA: Diagnosis not present

## 2015-08-13 DIAGNOSIS — J3081 Allergic rhinitis due to animal (cat) (dog) hair and dander: Secondary | ICD-10-CM | POA: Diagnosis not present

## 2015-08-13 DIAGNOSIS — J301 Allergic rhinitis due to pollen: Secondary | ICD-10-CM | POA: Diagnosis not present

## 2015-08-20 DIAGNOSIS — J301 Allergic rhinitis due to pollen: Secondary | ICD-10-CM | POA: Diagnosis not present

## 2015-08-20 DIAGNOSIS — J3089 Other allergic rhinitis: Secondary | ICD-10-CM | POA: Diagnosis not present

## 2015-08-27 DIAGNOSIS — J3081 Allergic rhinitis due to animal (cat) (dog) hair and dander: Secondary | ICD-10-CM | POA: Diagnosis not present

## 2015-08-27 DIAGNOSIS — J3089 Other allergic rhinitis: Secondary | ICD-10-CM | POA: Diagnosis not present

## 2015-08-27 DIAGNOSIS — J301 Allergic rhinitis due to pollen: Secondary | ICD-10-CM | POA: Diagnosis not present

## 2015-09-03 DIAGNOSIS — J3089 Other allergic rhinitis: Secondary | ICD-10-CM | POA: Diagnosis not present

## 2015-09-03 DIAGNOSIS — J3081 Allergic rhinitis due to animal (cat) (dog) hair and dander: Secondary | ICD-10-CM | POA: Diagnosis not present

## 2015-09-03 DIAGNOSIS — J301 Allergic rhinitis due to pollen: Secondary | ICD-10-CM | POA: Diagnosis not present

## 2015-09-06 DIAGNOSIS — K1230 Oral mucositis (ulcerative), unspecified: Secondary | ICD-10-CM | POA: Diagnosis not present

## 2015-09-10 DIAGNOSIS — J3081 Allergic rhinitis due to animal (cat) (dog) hair and dander: Secondary | ICD-10-CM | POA: Diagnosis not present

## 2015-09-10 DIAGNOSIS — J3089 Other allergic rhinitis: Secondary | ICD-10-CM | POA: Diagnosis not present

## 2015-09-10 DIAGNOSIS — J301 Allergic rhinitis due to pollen: Secondary | ICD-10-CM | POA: Diagnosis not present

## 2015-09-19 DIAGNOSIS — J3081 Allergic rhinitis due to animal (cat) (dog) hair and dander: Secondary | ICD-10-CM | POA: Diagnosis not present

## 2015-09-19 DIAGNOSIS — J3089 Other allergic rhinitis: Secondary | ICD-10-CM | POA: Diagnosis not present

## 2015-09-19 DIAGNOSIS — J301 Allergic rhinitis due to pollen: Secondary | ICD-10-CM | POA: Diagnosis not present

## 2015-09-24 DIAGNOSIS — J301 Allergic rhinitis due to pollen: Secondary | ICD-10-CM | POA: Diagnosis not present

## 2015-09-24 DIAGNOSIS — J3089 Other allergic rhinitis: Secondary | ICD-10-CM | POA: Diagnosis not present

## 2015-09-24 DIAGNOSIS — J3081 Allergic rhinitis due to animal (cat) (dog) hair and dander: Secondary | ICD-10-CM | POA: Diagnosis not present

## 2015-10-01 DIAGNOSIS — J301 Allergic rhinitis due to pollen: Secondary | ICD-10-CM | POA: Diagnosis not present

## 2015-10-01 DIAGNOSIS — J3089 Other allergic rhinitis: Secondary | ICD-10-CM | POA: Diagnosis not present

## 2015-10-01 DIAGNOSIS — J3081 Allergic rhinitis due to animal (cat) (dog) hair and dander: Secondary | ICD-10-CM | POA: Diagnosis not present

## 2015-10-08 DIAGNOSIS — J3089 Other allergic rhinitis: Secondary | ICD-10-CM | POA: Diagnosis not present

## 2015-10-08 DIAGNOSIS — J301 Allergic rhinitis due to pollen: Secondary | ICD-10-CM | POA: Diagnosis not present

## 2015-10-08 DIAGNOSIS — J3081 Allergic rhinitis due to animal (cat) (dog) hair and dander: Secondary | ICD-10-CM | POA: Diagnosis not present

## 2015-10-17 DIAGNOSIS — J301 Allergic rhinitis due to pollen: Secondary | ICD-10-CM | POA: Diagnosis not present

## 2015-10-17 DIAGNOSIS — J3089 Other allergic rhinitis: Secondary | ICD-10-CM | POA: Diagnosis not present

## 2015-10-17 DIAGNOSIS — J3081 Allergic rhinitis due to animal (cat) (dog) hair and dander: Secondary | ICD-10-CM | POA: Diagnosis not present

## 2015-10-22 DIAGNOSIS — J3081 Allergic rhinitis due to animal (cat) (dog) hair and dander: Secondary | ICD-10-CM | POA: Diagnosis not present

## 2015-10-22 DIAGNOSIS — J3089 Other allergic rhinitis: Secondary | ICD-10-CM | POA: Diagnosis not present

## 2015-10-22 DIAGNOSIS — J301 Allergic rhinitis due to pollen: Secondary | ICD-10-CM | POA: Diagnosis not present

## 2015-10-24 DIAGNOSIS — J3081 Allergic rhinitis due to animal (cat) (dog) hair and dander: Secondary | ICD-10-CM | POA: Diagnosis not present

## 2015-10-24 DIAGNOSIS — J3089 Other allergic rhinitis: Secondary | ICD-10-CM | POA: Diagnosis not present

## 2015-10-29 DIAGNOSIS — J3081 Allergic rhinitis due to animal (cat) (dog) hair and dander: Secondary | ICD-10-CM | POA: Diagnosis not present

## 2015-10-29 DIAGNOSIS — J301 Allergic rhinitis due to pollen: Secondary | ICD-10-CM | POA: Diagnosis not present

## 2015-10-29 DIAGNOSIS — J3089 Other allergic rhinitis: Secondary | ICD-10-CM | POA: Diagnosis not present

## 2015-11-05 DIAGNOSIS — J3081 Allergic rhinitis due to animal (cat) (dog) hair and dander: Secondary | ICD-10-CM | POA: Diagnosis not present

## 2015-11-05 DIAGNOSIS — J301 Allergic rhinitis due to pollen: Secondary | ICD-10-CM | POA: Diagnosis not present

## 2015-11-05 DIAGNOSIS — J3089 Other allergic rhinitis: Secondary | ICD-10-CM | POA: Diagnosis not present

## 2015-11-12 DIAGNOSIS — J3089 Other allergic rhinitis: Secondary | ICD-10-CM | POA: Diagnosis not present

## 2015-11-12 DIAGNOSIS — J3081 Allergic rhinitis due to animal (cat) (dog) hair and dander: Secondary | ICD-10-CM | POA: Diagnosis not present

## 2015-11-12 DIAGNOSIS — J301 Allergic rhinitis due to pollen: Secondary | ICD-10-CM | POA: Diagnosis not present

## 2015-11-18 ENCOUNTER — Encounter: Payer: Self-pay | Admitting: Family Medicine

## 2015-11-18 ENCOUNTER — Ambulatory Visit (INDEPENDENT_AMBULATORY_CARE_PROVIDER_SITE_OTHER): Payer: Medicare Other | Admitting: Family Medicine

## 2015-11-18 VITALS — BP 140/60 | HR 67 | Temp 98.3°F | Ht 64.0 in | Wt 132.8 lb

## 2015-11-18 DIAGNOSIS — M180 Bilateral primary osteoarthritis of first carpometacarpal joints: Secondary | ICD-10-CM

## 2015-11-18 DIAGNOSIS — M19049 Primary osteoarthritis, unspecified hand: Secondary | ICD-10-CM

## 2015-11-18 NOTE — Progress Notes (Signed)
Pre visit review using our clinic review tool, if applicable. No additional management support is needed unless otherwise documented below in the visit note. 

## 2015-11-18 NOTE — Progress Notes (Signed)
Dr. Frederico Hamman T. Jonda Alanis, MD, [facility] [place], [address] Phone: [phone] Fax: [phone]  11/18/2015  Patient: [name] [name], MRN: [id number], DOB: 11/10/1923, 82 y.o.  Primary Physician:  Viviana Simpler, MD   Chief Complaint  Patient presents with  . Hand Pain    Right   Subjective:   [name] [name] is a 82 y.o. very pleasant female patient who presents with the following:  Hands are week and swollen now has some advanced OA of the hand. She has had extensive changes in her hands for years, and her hands have been progressively weak, and the right hand in particular has become weaker and weaker over time. She also has pain in her basal joint on the right.  Past Medical History, Surgical History, Social History, Family History, Problem List, Medications, and Allergies have been reviewed and updated if relevant.  Patient Active Problem List   Diagnosis Date Noted  . Primary osteoarthritis of right hip 05/23/2015  . Left hip pain 05/07/2015  . Rectal bleeding 01/15/2015  . Advanced directives, counseling/discussion 02/07/2014  . Routine general medical examination at a health care facility 02/07/2014  . Allergic rhinitis due to pollen   . Osteoporosis   . Osteoarthritis, multiple sites   . Urge incontinence   . Hyperlipidemia   . Diastolic dysfunction     Past Medical History  Diagnosis Date  . Allergic rhinitis due to pollen     some shellfish also  . Osteoporosis   . Osteoarthritis, multiple sites   . Urge incontinence   . Hyperlipidemia   . Aortic sclerosis ([place]) 5/12    On echo. no stenosis  . Diastolic dysfunction     stress echo otherwise normal 1/04. Repeat 5/11 also negative  . History of seasonal allergies   . Arthritis   . Overactive bladder     Past Surgical History  Procedure Laterality Date  . Inguinal hernia repair  2012    left side with mesh  . Umbilical hernia  repair  02/04/06  . Cystocele repair  2007    and rectocele  . Tonsillectomy and adenoidectomy      as child  . Dilation and curettage of uterus    . Total hip arthroplasty Right 05/23/2015    Procedure: TOTAL HIP ARTHROPLASTY ANTERIOR APPROACH;  Surgeon: Hessie Knows, MD;  Location: [facility] ORS;  Service: Orthopedics;  Laterality: Right;    Social History   Social History  . Marital Status: Married    Spouse Name: N/A  . Number of Children: 3  . Years of Education: N/A   Occupational History  . 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 Smoking status: Former Research scientist (life sciences)  . Smokeless tobacco: Never Used  . Alcohol Use: Yes     Comment: wine before dinner  . Drug Use: No  . Sexual Activity: Not on file   Other Topics Concern  . Not on file   Social History Narrative   Has living will   Husband then daughter [name], is health care POA   Would accept trial of resuscitation   Would probably accept feeding tube    Family History  Problem Relation Age of Onset  . Cancer Father 81    colon cancer  . Heart disease Father     Allergies  Allergen Reactions  .  Demerol [Meperidine]     dizzy  . Shellfish Allergy     Medication list reviewed and updated in full in [place].  GEN: No fevers, chills. Nontoxic. Primarily MSK c/o today. MSK: Detailed in the HPI GI: tolerating PO intake without difficulty Neuro: No numbness, parasthesias, or tingling associated. Otherwise the pertinent positives of the ROS are noted above.   Objective:   BP 140/60 mmHg  Pulse 67  Temp(Src) 98.3 F (36.8 C) (Oral)  Ht 5\' 4"  (1.626 m)  Wt 132 lb 12 oz (60.215 kg)  BMI 22.78 kg/m2   GEN: WDWN, NAD, Non-toxic, Alert & Oriented x 3 HEENT: Atraumatic, Normocephalic.  Ears and Nose: No external deformity. EXTR: No clubbing/cyanosis/edema NEURO: Normal gait.  PSYCH: Normally interactive. Conversant. Not depressed or anxious  appearing.  Calm demeanor.    Extensive osteoarthritic changes throughout both hands with quite a large Heberden's nodes diffusely, and her second and third digits on the right do not fully flex. She also is having some pain at the [facility] joint on the first. There is restriction of motion at the wrist. She also has wasting of most of her hand muscles on the right.  Radiology: No results found.  Assessment and Plan:   Primary osteoarthritis of both first carpometacarpal joints  Osteoarthritis, hand, primary localized, unspecified laterality  She is taking quite a bit of withdrawal medication as well as pain medication. I would have her start doing some hand therapy at home with a half a dozen simple exercises as well as some strengthening for her grip and intrinsic musculature of her hands.  Also gave her a thumb spica splint that she can use at night when she is having particularly bad days.  Follow-up: prn  Signed,  Maryse Brierley T. Houston Zapien, MD   Patient's Medications  New Prescriptions   No medications on file  Previous Medications   ACETAMINOPHEN (TYLENOL) 650 MG CR TABLET    Take 650 mg by mouth 3 (three) times daily.   ASPIRIN 81 MG TABLET    Take 81 mg by mouth daily.   CELECOXIB (CELEBREX) 200 MG CAPSULE    Take 1 capsule (200 mg total) by mouth daily.   [employer] VITAMIN D3 1000 UNITS CAPSULE    TAKE ONE CAPSULE BY MOUTH DAILY   DETROL LA 4 MG 24 HR CAPSULE    TAKE 1 CAPSULE DAILY   DIPHENHYDRAMINE HCL (ALLERGY MED PO)    Take 1 tablet by mouth daily.   HYDROCODONE-ACETAMINOPHEN (NORCO/VICODIN) 5-325 MG TABLET    Take 1-2 tablets by mouth every 4 (four) hours as needed (breakthrough pain).   HYDROCORTISONE 2.5 % CREAM    Apply topically 3 (three) times daily as needed.   LACTOBACILLUS (ACIDOPHILUS) 100 MG CAPS    Take by mouth.   MULTIPLE VITAMIN (MULTIVITAMIN) TABLET    Take 1 tablet by mouth daily.   POLYETHYLENE GLYCOL (MIRALAX / GLYCOLAX) PACKET    Take 17 g by mouth daily as  needed.   PROBIOTIC PRODUCT (PROBIOTIC ADVANCED PO)    Take 1 capsule by mouth daily.   SIMETHICONE (GAS-X PO)    Take by mouth 2 (two) times daily.   TRAMADOL (ULTRAM) 50 MG TABLET    Take 1 tablet (50 mg total) by mouth 3 (three) times daily as needed.  Modified Medications   No medications on file  Discontinued Medications   No medications on file

## 2015-11-19 DIAGNOSIS — J301 Allergic rhinitis due to pollen: Secondary | ICD-10-CM | POA: Diagnosis not present

## 2015-11-19 DIAGNOSIS — J3089 Other allergic rhinitis: Secondary | ICD-10-CM | POA: Diagnosis not present

## 2015-11-26 DIAGNOSIS — J301 Allergic rhinitis due to pollen: Secondary | ICD-10-CM | POA: Diagnosis not present

## 2015-11-26 DIAGNOSIS — J3081 Allergic rhinitis due to animal (cat) (dog) hair and dander: Secondary | ICD-10-CM | POA: Diagnosis not present

## 2015-11-26 DIAGNOSIS — J3089 Other allergic rhinitis: Secondary | ICD-10-CM | POA: Diagnosis not present

## 2015-12-03 DIAGNOSIS — J301 Allergic rhinitis due to pollen: Secondary | ICD-10-CM | POA: Diagnosis not present

## 2015-12-03 DIAGNOSIS — J3081 Allergic rhinitis due to animal (cat) (dog) hair and dander: Secondary | ICD-10-CM | POA: Diagnosis not present

## 2015-12-03 DIAGNOSIS — J3089 Other allergic rhinitis: Secondary | ICD-10-CM | POA: Diagnosis not present

## 2015-12-05 DIAGNOSIS — J301 Allergic rhinitis due to pollen: Secondary | ICD-10-CM | POA: Diagnosis not present

## 2015-12-05 DIAGNOSIS — J3089 Other allergic rhinitis: Secondary | ICD-10-CM | POA: Diagnosis not present

## 2015-12-05 DIAGNOSIS — J3081 Allergic rhinitis due to animal (cat) (dog) hair and dander: Secondary | ICD-10-CM | POA: Diagnosis not present

## 2015-12-10 DIAGNOSIS — J301 Allergic rhinitis due to pollen: Secondary | ICD-10-CM | POA: Diagnosis not present

## 2015-12-10 DIAGNOSIS — J3089 Other allergic rhinitis: Secondary | ICD-10-CM | POA: Diagnosis not present

## 2015-12-10 DIAGNOSIS — H1045 Other chronic allergic conjunctivitis: Secondary | ICD-10-CM | POA: Diagnosis not present

## 2015-12-10 DIAGNOSIS — J3081 Allergic rhinitis due to animal (cat) (dog) hair and dander: Secondary | ICD-10-CM | POA: Diagnosis not present

## 2015-12-12 ENCOUNTER — Ambulatory Visit (INDEPENDENT_AMBULATORY_CARE_PROVIDER_SITE_OTHER)
Admission: RE | Admit: 2015-12-12 | Discharge: 2015-12-12 | Disposition: A | Discharge status: Home / Self Care | Payer: Medicare Other | Source: Ambulatory Visit | Attending: Internal Medicine | Admitting: Internal Medicine

## 2015-12-12 ENCOUNTER — Encounter: Payer: Self-pay | Admitting: Internal Medicine

## 2015-12-12 ENCOUNTER — Ambulatory Visit (INDEPENDENT_AMBULATORY_CARE_PROVIDER_SITE_OTHER): Payer: Medicare Other | Admitting: Internal Medicine

## 2015-12-12 VITALS — BP 140/82 | HR 99 | Temp 98.0°F | Wt 133.0 lb

## 2015-12-12 DIAGNOSIS — J3081 Allergic rhinitis due to animal (cat) (dog) hair and dander: Secondary | ICD-10-CM | POA: Diagnosis not present

## 2015-12-12 DIAGNOSIS — J3089 Other allergic rhinitis: Secondary | ICD-10-CM | POA: Diagnosis not present

## 2015-12-12 DIAGNOSIS — M25531 Pain in right wrist: Secondary | ICD-10-CM

## 2015-12-12 DIAGNOSIS — J301 Allergic rhinitis due to pollen: Secondary | ICD-10-CM | POA: Diagnosis not present

## 2015-12-12 DIAGNOSIS — M19031 Primary osteoarthritis, right wrist: Secondary | ICD-10-CM | POA: Diagnosis not present

## 2015-12-12 NOTE — Progress Notes (Signed)
Pre visit review using our clinic review tool, if applicable. No additional management support is needed unless otherwise documented below in the visit note. 

## 2015-12-12 NOTE — Progress Notes (Signed)
Subjective:    Patient ID: [name] [name], female    DOB: Jan 29, 1924, 82 y.o.   MRN: [id number]  HPI Here due to "awful" pain in right hand and wrist especially Drops things Hard to give care to husband Just takes tylenol 1000 mg bid--may help a little. One at bedtime Aches in AM ---but not all the time  Uses heat and topical treatment--not much help Some swelling around thenar eminence Feels hot at times  Current Outpatient Prescriptions on File Prior to Visit  Medication Sig Dispense Refill  . acetaminophen (TYLENOL) 650 MG CR tablet Take 650 mg by mouth 3 (three) times daily.    Marland Kitchen aspirin 81 MG tablet Take 81 mg by mouth daily.    . celecoxib (CELEBREX) 200 MG capsule Take 1 capsule (200 mg total) by mouth daily. 90 capsule 3  . CVS VITAMIN D3 1000 UNITS capsule TAKE ONE CAPSULE BY MOUTH DAILY 30 capsule 0  . DETROL LA 4 MG 24 hr capsule TAKE 1 CAPSULE DAILY 90 capsule 3  . DiphenhydrAMINE HCl (ALLERGY MED PO) Take 1 tablet by mouth daily.    Marland Kitchen HYDROcodone-acetaminophen (NORCO/VICODIN) 5-325 MG tablet Take 1-2 tablets by mouth every 4 (four) hours as needed (breakthrough pain). 30 tablet 0  . hydrocortisone 2.5 % cream Apply topically 3 (three) times daily as needed. 28 g 3  . Lactobacillus (ACIDOPHILUS) 100 MG CAPS Take by mouth.    . Multiple Vitamin (MULTIVITAMIN) tablet Take 1 tablet by mouth daily.    . polyethylene glycol (MIRALAX / GLYCOLAX) packet Take 17 g by mouth daily as needed.    . Probiotic Product (PROBIOTIC ADVANCED PO) Take 1 capsule by mouth daily.    . Simethicone (GAS-X PO) Take by mouth 2 (two) times daily.     No current facility-administered medications on file prior to visit.    Allergies  Allergen Reactions  . Demerol [Meperidine]     dizzy  . Shellfish Allergy     Past Medical History  Diagnosis Date  . Allergic rhinitis due to pollen     some shellfish also  . Osteoporosis   . Osteoarthritis, multiple sites   . Urge incontinence   .  Hyperlipidemia   . Aortic sclerosis ([place]) 5/12    On echo. no stenosis  . Diastolic dysfunction     stress echo otherwise normal 1/04. Repeat 5/11 also negative  . History of seasonal allergies   . Arthritis   . Overactive bladder     Past Surgical History  Procedure Laterality Date  . Inguinal hernia repair  2012    left side with mesh  . Umbilical hernia repair  02/04/06  . Cystocele repair  2007    and rectocele  . Tonsillectomy and adenoidectomy      as child  . Dilation and curettage of uterus    . Total hip arthroplasty Right 05/23/2015    Procedure: TOTAL HIP ARTHROPLASTY ANTERIOR APPROACH;  Surgeon: Hessie Knows, MD;  Location: [facility] ORS;  Service: Orthopedics;  Laterality: Right;    Family History  Problem Relation Age of Onset  . Cancer Father 39    colon cancer  . Heart disease Father     Social History   Social History  . Marital Status: Married    Spouse Name: N/A  . Number of Children: 3  . Years of Education: N/A   Occupational History  . 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 Smoking status: Former Research scientist (life sciences)  . Smokeless tobacco: Never Used  . Alcohol Use: Yes     Comment: wine before dinner  . Drug Use: No  . Sexual Activity: Not on file   Other Topics Concern  . Not on file   Social History Narrative   Has living will   Husband then daughter [name], is health care POA   Would accept trial of resuscitation   Would probably accept feeding tube   Review of Systems No fever Doesn't feel sick Uses the thumb spica cast at times---like for doing more strenuous tasks in husband's care    Objective:   Physical Exam  Musculoskeletal:  No swelling or active synovitis Some thenar atrophy No localized tenderness now          Assessment & Plan:

## 2015-12-12 NOTE — Patient Instructions (Addendum)
Please try over the counter lidocaine 4% patches (I think one brand is salon pas)--and put that right on the most painful area. Please change to the arthritis tylenol (extended release 650mg ) and take 3 times a day regularly. You can then take an extra 500mg  tablet (the ones you already have) and take it up to twice a day.

## 2015-12-12 NOTE — Assessment & Plan Note (Signed)
Some increase in function disability She is increasing aide time to help dress husband, etc Discussed changing tylenol regimen, add lidocaine topically Check x-ray---make sure no surprises

## 2015-12-17 DIAGNOSIS — J301 Allergic rhinitis due to pollen: Secondary | ICD-10-CM | POA: Diagnosis not present

## 2015-12-17 DIAGNOSIS — J3081 Allergic rhinitis due to animal (cat) (dog) hair and dander: Secondary | ICD-10-CM | POA: Diagnosis not present

## 2015-12-17 DIAGNOSIS — J3089 Other allergic rhinitis: Secondary | ICD-10-CM | POA: Diagnosis not present

## 2015-12-24 DIAGNOSIS — J3089 Other allergic rhinitis: Secondary | ICD-10-CM | POA: Diagnosis not present

## 2015-12-24 DIAGNOSIS — J3081 Allergic rhinitis due to animal (cat) (dog) hair and dander: Secondary | ICD-10-CM | POA: Diagnosis not present

## 2015-12-24 DIAGNOSIS — J301 Allergic rhinitis due to pollen: Secondary | ICD-10-CM | POA: Diagnosis not present

## 2015-12-31 DIAGNOSIS — J3081 Allergic rhinitis due to animal (cat) (dog) hair and dander: Secondary | ICD-10-CM | POA: Diagnosis not present

## 2015-12-31 DIAGNOSIS — J301 Allergic rhinitis due to pollen: Secondary | ICD-10-CM | POA: Diagnosis not present

## 2015-12-31 DIAGNOSIS — J3089 Other allergic rhinitis: Secondary | ICD-10-CM | POA: Diagnosis not present

## 2016-01-07 DIAGNOSIS — J3089 Other allergic rhinitis: Secondary | ICD-10-CM | POA: Diagnosis not present

## 2016-01-07 DIAGNOSIS — J3081 Allergic rhinitis due to animal (cat) (dog) hair and dander: Secondary | ICD-10-CM | POA: Diagnosis not present

## 2016-01-07 DIAGNOSIS — J301 Allergic rhinitis due to pollen: Secondary | ICD-10-CM | POA: Diagnosis not present

## 2016-01-11 ENCOUNTER — Other Ambulatory Visit: Payer: Self-pay | Admitting: Internal Medicine

## 2016-01-14 DIAGNOSIS — J3089 Other allergic rhinitis: Secondary | ICD-10-CM | POA: Diagnosis not present

## 2016-01-14 DIAGNOSIS — J3081 Allergic rhinitis due to animal (cat) (dog) hair and dander: Secondary | ICD-10-CM | POA: Diagnosis not present

## 2016-01-14 DIAGNOSIS — J301 Allergic rhinitis due to pollen: Secondary | ICD-10-CM | POA: Diagnosis not present

## 2016-01-21 DIAGNOSIS — J3089 Other allergic rhinitis: Secondary | ICD-10-CM | POA: Diagnosis not present

## 2016-01-21 DIAGNOSIS — H401121 Primary open-angle glaucoma, left eye, mild stage: Secondary | ICD-10-CM | POA: Diagnosis not present

## 2016-01-21 DIAGNOSIS — J301 Allergic rhinitis due to pollen: Secondary | ICD-10-CM | POA: Diagnosis not present

## 2016-01-21 DIAGNOSIS — J3081 Allergic rhinitis due to animal (cat) (dog) hair and dander: Secondary | ICD-10-CM | POA: Diagnosis not present

## 2016-01-24 DIAGNOSIS — J301 Allergic rhinitis due to pollen: Secondary | ICD-10-CM | POA: Diagnosis not present

## 2016-01-28 DIAGNOSIS — J3081 Allergic rhinitis due to animal (cat) (dog) hair and dander: Secondary | ICD-10-CM | POA: Diagnosis not present

## 2016-01-28 DIAGNOSIS — J3089 Other allergic rhinitis: Secondary | ICD-10-CM | POA: Diagnosis not present

## 2016-01-28 DIAGNOSIS — J301 Allergic rhinitis due to pollen: Secondary | ICD-10-CM | POA: Diagnosis not present

## 2016-02-06 DIAGNOSIS — J301 Allergic rhinitis due to pollen: Secondary | ICD-10-CM | POA: Diagnosis not present

## 2016-02-06 DIAGNOSIS — J3089 Other allergic rhinitis: Secondary | ICD-10-CM | POA: Diagnosis not present

## 2016-02-06 DIAGNOSIS — J3081 Allergic rhinitis due to animal (cat) (dog) hair and dander: Secondary | ICD-10-CM | POA: Diagnosis not present

## 2016-02-11 DIAGNOSIS — J3081 Allergic rhinitis due to animal (cat) (dog) hair and dander: Secondary | ICD-10-CM | POA: Diagnosis not present

## 2016-02-11 DIAGNOSIS — J3089 Other allergic rhinitis: Secondary | ICD-10-CM | POA: Diagnosis not present

## 2016-02-11 DIAGNOSIS — J301 Allergic rhinitis due to pollen: Secondary | ICD-10-CM | POA: Diagnosis not present

## 2016-02-13 DIAGNOSIS — J3089 Other allergic rhinitis: Secondary | ICD-10-CM | POA: Diagnosis not present

## 2016-02-13 DIAGNOSIS — J301 Allergic rhinitis due to pollen: Secondary | ICD-10-CM | POA: Diagnosis not present

## 2016-02-13 DIAGNOSIS — J3081 Allergic rhinitis due to animal (cat) (dog) hair and dander: Secondary | ICD-10-CM | POA: Diagnosis not present

## 2016-02-17 ENCOUNTER — Ambulatory Visit (INDEPENDENT_AMBULATORY_CARE_PROVIDER_SITE_OTHER): Payer: Medicare Other | Admitting: Internal Medicine

## 2016-02-17 ENCOUNTER — Encounter: Payer: Self-pay | Admitting: Internal Medicine

## 2016-02-17 VITALS — BP 130/70 | HR 50 | Temp 98.4°F | Ht 63.5 in | Wt 131.0 lb

## 2016-02-17 DIAGNOSIS — N3941 Urge incontinence: Secondary | ICD-10-CM

## 2016-02-17 DIAGNOSIS — I519 Heart disease, unspecified: Secondary | ICD-10-CM | POA: Diagnosis not present

## 2016-02-17 DIAGNOSIS — I5189 Other ill-defined heart diseases: Secondary | ICD-10-CM

## 2016-02-17 DIAGNOSIS — M150 Primary generalized (osteo)arthritis: Secondary | ICD-10-CM | POA: Diagnosis not present

## 2016-02-17 DIAGNOSIS — J301 Allergic rhinitis due to pollen: Secondary | ICD-10-CM

## 2016-02-17 DIAGNOSIS — Z7189 Other specified counseling: Secondary | ICD-10-CM

## 2016-02-17 DIAGNOSIS — E785 Hyperlipidemia, unspecified: Secondary | ICD-10-CM

## 2016-02-17 DIAGNOSIS — Z0000 Encounter for general adult medical examination without abnormal findings: Secondary | ICD-10-CM

## 2016-02-17 DIAGNOSIS — M159 Polyosteoarthritis, unspecified: Secondary | ICD-10-CM

## 2016-02-17 LAB — COMPREHENSIVE METABOLIC PANEL
ALBUMIN: 3.8 g/dL (ref 3.5–5.2)
ALK PHOS: 56 U/L (ref 39–117)
ALT: 12 U/L (ref 0–35)
AST: 20 U/L (ref 0–37)
BILIRUBIN TOTAL: 0.5 mg/dL (ref 0.2–1.2)
BUN: 23 mg/dL (ref 6–23)
CO2: 27 mEq/L (ref 19–32)
Calcium: 9.2 mg/dL (ref 8.4–10.5)
Chloride: 99 mEq/L (ref 96–112)
Creatinine, Ser: 0.67 mg/dL (ref 0.40–1.20)
GFR: 87.91 mL/min (ref >60.00)
GLUCOSE: 94 mg/dL (ref 70–99)
Potassium: 4.7 mEq/L (ref 3.5–5.1)
SODIUM: 132 meq/L — AB (ref 135–145)
TOTAL PROTEIN: 6.6 g/dL (ref 6.0–8.3)

## 2016-02-17 LAB — T4, FREE: FREE T4: 0.69 ng/dL (ref 0.60–1.60)

## 2016-02-17 MED ORDER — ZOSTER VACCINE LIVE 19400 UNT/0.65ML ~~LOC~~ SUSR: 0.6500 mL | Freq: Once | SUBCUTANEOUS | Status: DC

## 2016-02-17 MED ORDER — FLUCONAZOLE 150 MG PO TABS: 150.0000 mg | ORAL_TABLET | Freq: Once | ORAL | Status: DC

## 2016-02-17 NOTE — Assessment & Plan Note (Signed)
Ongoing pain --especially wrist She will consult with orthopedist

## 2016-02-17 NOTE — Assessment & Plan Note (Signed)
Satisfied with the detrol--no apparent side effects

## 2016-02-17 NOTE — Assessment & Plan Note (Signed)
Mild stable DOE No clear CHF though

## 2016-02-17 NOTE — Assessment & Plan Note (Signed)
Still sees Dr Donneta Romberg ---immunotherapy

## 2016-02-17 NOTE — Assessment & Plan Note (Signed)
I have personally reviewed the Medicare Annual Wellness questionnaire and have noted 1. The patient's medical and social history 2. Their use of alcohol, tobacco or illicit drugs 3. Their current medications and supplements 4. The patient's functional ability including ADL's, fall risks, home safety risks and hearing or visual             impairment. 5. Diet and physical activities 6. Evidence for depression or mood disorders  The patients weight, height, BMI and visual acuity have been recorded in the chart I have made referrals, counseling and provided education to the patient based review of the above and I have provided the pt with a written personalized care plan for preventive services.  I have provided you with a copy of your personalized plan for preventive services. Please take the time to review along with your updated medication list.  Rx for zostavax--she will consider Yearly flu vaccine No cancer screening due to age Continue regular exercise

## 2016-02-17 NOTE — Assessment & Plan Note (Signed)
Mild We will not treat

## 2016-02-17 NOTE — Progress Notes (Signed)
Subjective:    Patient ID: [name] [name], female    DOB: 1924/07/03, 82 y.o.   MRN: [id number]  HPI Here for Medicare wellness and follow up of chronic medical conditions Reviewed form and advanced directives Reviewed other doctors Did have have the THR last October Does have 1 drink nightly ([facility]) No tobacco Vision okay--Rx for glaucoma Hearing aides--help some Tries to exercise regularly [place] once this year--mild injury (abrasions and bump on head) No depression or anhedonia Mild memory issues--nothing serious, no functional decline Does all instrumental ADLS--but wrist limits her some  Ongoing problems with right wrist Very painful at times Limits her significantly---even turning the key in ignition Takes celebrex daily and tylenol Discussed that she should speak to orthopedist about considering an injection  Slight vaginal discharge and odor Has to wear panty liner Will try Rx with fluconazole--- if ineffective, consider metronidazole  Ongoing intermittent back pain Not limiting--tries to be careful with husband's care Now has daily aide to help get him dressed Hip has done well since the surgery  No chest pain or palpitations Chronic DOE--like walking up a hill No sig edema  Still on detrol This helps her keep control Specially difficult first thing in AM Only needs liners--but generally continent with the med  Current Outpatient Prescriptions on File Prior to Visit  Medication Sig Dispense Refill  . acetaminophen (TYLENOL) 650 MG CR tablet Take 650 mg by mouth 3 (three) times daily.    Marland Kitchen aspirin 81 MG tablet Take 81 mg by mouth daily.    . celecoxib (CELEBREX) 200 MG capsule TAKE 1 CAPSULE DAILY 90 capsule 0  . CVS VITAMIN D3 1000 UNITS capsule TAKE ONE CAPSULE BY MOUTH DAILY 30 capsule 0  . DETROL LA 4 MG 24 hr capsule TAKE 1 CAPSULE DAILY 90 capsule 3  . DiphenhydrAMINE HCl (ALLERGY MED PO) Take 1 tablet by mouth daily.    Marland Kitchen  HYDROcodone-acetaminophen (NORCO/VICODIN) 5-325 MG tablet Take 1-2 tablets by mouth every 4 (four) hours as needed (breakthrough pain). 30 tablet 0  . hydrocortisone 2.5 % cream Apply topically 3 (three) times daily as needed. 28 g 3  . Lactobacillus (ACIDOPHILUS) 100 MG CAPS Take by mouth.    . Multiple Vitamin (MULTIVITAMIN) tablet Take 1 tablet by mouth daily.    . polyethylene glycol (MIRALAX / GLYCOLAX) packet Take 17 g by mouth daily as needed.    . Probiotic Product (PROBIOTIC ADVANCED PO) Take 1 capsule by mouth daily.    . Simethicone (GAS-X PO) Take by mouth 2 (two) times daily.     No current facility-administered medications on file prior to visit.    Allergies  Allergen Reactions  . Demerol [Meperidine]     dizzy  . Shellfish Allergy     Past Medical History  Diagnosis Date  . Allergic rhinitis due to pollen     some shellfish also  . Osteoporosis   . Osteoarthritis, multiple sites   . Urge incontinence   . Hyperlipidemia   . Aortic sclerosis ([place]) 5/12    On echo. no stenosis  . Diastolic dysfunction     stress echo otherwise normal 1/04. Repeat 5/11 also negative  . History of seasonal allergies   . Arthritis   . Overactive bladder     Past Surgical History  Procedure Laterality Date  . Inguinal hernia repair  2012    left side with mesh  . Umbilical hernia repair  02/04/06  . Cystocele repair  2007  and rectocele  . Tonsillectomy and adenoidectomy      as child  . Dilation and curettage of uterus    . Total hip arthroplasty Right 05/23/2015    Procedure: TOTAL HIP ARTHROPLASTY ANTERIOR APPROACH;  Surgeon: Hessie Knows, MD;  Location: [facility] ORS;  Service: Orthopedics;  Laterality: Right;    Family History  Problem Relation Age of Onset  . Cancer Father 78    colon cancer  . Heart disease Father     Social History   Social History  . Marital Status: Married    Spouse Name: N/A  . Number of Children: 3  . Years of Education: N/A    Occupational History  . 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 Smoking status: Former Research scientist (life sciences)  . Smokeless tobacco: Never Used  . Alcohol Use: Yes     Comment: wine before dinner  . Drug Use: No  . Sexual Activity: Not on file   Other Topics Concern  . Not on file   Social History Narrative   Has living will   Husband then daughter [name] [name], is health care POA   Would accept trial of resuscitation   Would probably accept feeding tube   Review of Systems Appetite is good Weight stable Usually sleeps okay Wears seat belt Teeth are okay--regular with dentist Bowels are slow--not taking stool softeners (less often uses the miralax) No skin problems--- regular with dermatologist    Objective:   Physical Exam  Constitutional: She is oriented to person, place, and time. She appears well-developed and well-nourished. No distress.  HENT:  Mouth/Throat: Oropharynx is clear and moist. No oropharyngeal exudate.  Neck: Normal range of motion. Neck supple. No thyromegaly present.  Cardiovascular: Normal rate, regular rhythm and intact distal pulses.  Exam reveals no gallop.   ?very slight aortic systolic murmur  Pulmonary/Chest: Effort normal and breath sounds normal. No respiratory distress. She has no wheezes. She has no rales.  Abdominal: Soft. There is no tenderness.  Musculoskeletal: She exhibits no edema or tenderness.  Lymphadenopathy:    She has no cervical adenopathy.  Neurological: She is alert and oriented to person, place, and time.  President--- "[name] [name], [name] [name]"-- ??(didn't get Obama) [id number] Recall 3/3  Skin: No rash noted. No erythema.  Psychiatric: She has a normal mood and affect. Her behavior is normal.          Assessment & Plan:

## 2016-02-17 NOTE — Assessment & Plan Note (Signed)
See social history 

## 2016-02-17 NOTE — Progress Notes (Signed)
Pre visit review using our clinic review tool, if applicable. No additional management support is needed unless otherwise documented below in the visit note. 

## 2016-02-18 DIAGNOSIS — J301 Allergic rhinitis due to pollen: Secondary | ICD-10-CM | POA: Diagnosis not present

## 2016-02-18 DIAGNOSIS — J3089 Other allergic rhinitis: Secondary | ICD-10-CM | POA: Diagnosis not present

## 2016-02-18 DIAGNOSIS — J3081 Allergic rhinitis due to animal (cat) (dog) hair and dander: Secondary | ICD-10-CM | POA: Diagnosis not present

## 2016-02-18 LAB — CBC WITH DIFFERENTIAL/PLATELET
BASOS ABS: 0 10*3/uL (ref 0.0–0.1)
Basophils Relative: 0.5 % (ref 0.0–3.0)
Eosinophils Absolute: 0.2 10*3/uL (ref 0.0–0.7)
Eosinophils Relative: 2.3 % (ref 0.0–5.0)
HCT: 35.5 % — ABNORMAL LOW (ref 36.0–46.0)
Hemoglobin: 12.1 g/dL (ref 12.0–15.0)
LYMPHS ABS: 4.9 10*3/uL — AB (ref 0.7–4.0)
Lymphocytes Relative: 52.7 % — ABNORMAL HIGH (ref 12.0–46.0)
MCHC: 33.9 g/dL (ref 30.0–36.0)
MCV: 93 fl (ref 78.0–100.0)
MONO ABS: 0.5 10*3/uL (ref 0.1–1.0)
MONOS PCT: 5.2 % (ref 3.0–12.0)
NEUTROS PCT: 39.3 % — AB (ref 43.0–77.0)
Neutro Abs: 3.6 10*3/uL (ref 1.4–7.7)
Platelets: 263 10*3/uL (ref 150.0–400.0)
RBC: 3.82 Mil/uL — AB (ref 3.87–5.11)
RDW: 14.1 % (ref 11.5–15.5)
WBC: 9.3 10*3/uL (ref 4.0–10.5)

## 2016-02-25 DIAGNOSIS — J3081 Allergic rhinitis due to animal (cat) (dog) hair and dander: Secondary | ICD-10-CM | POA: Diagnosis not present

## 2016-02-25 DIAGNOSIS — J301 Allergic rhinitis due to pollen: Secondary | ICD-10-CM | POA: Diagnosis not present

## 2016-02-25 DIAGNOSIS — J3089 Other allergic rhinitis: Secondary | ICD-10-CM | POA: Diagnosis not present

## 2016-02-27 DIAGNOSIS — J3081 Allergic rhinitis due to animal (cat) (dog) hair and dander: Secondary | ICD-10-CM | POA: Diagnosis not present

## 2016-02-27 DIAGNOSIS — J3089 Other allergic rhinitis: Secondary | ICD-10-CM | POA: Diagnosis not present

## 2016-02-27 DIAGNOSIS — J301 Allergic rhinitis due to pollen: Secondary | ICD-10-CM | POA: Diagnosis not present

## 2016-03-02 DIAGNOSIS — M19031 Primary osteoarthritis, right wrist: Secondary | ICD-10-CM | POA: Diagnosis not present

## 2016-03-02 DIAGNOSIS — M25531 Pain in right wrist: Secondary | ICD-10-CM | POA: Diagnosis not present

## 2016-03-03 DIAGNOSIS — J3081 Allergic rhinitis due to animal (cat) (dog) hair and dander: Secondary | ICD-10-CM | POA: Diagnosis not present

## 2016-03-03 DIAGNOSIS — J3089 Other allergic rhinitis: Secondary | ICD-10-CM | POA: Diagnosis not present

## 2016-03-03 DIAGNOSIS — J301 Allergic rhinitis due to pollen: Secondary | ICD-10-CM | POA: Diagnosis not present

## 2016-03-10 DIAGNOSIS — J3089 Other allergic rhinitis: Secondary | ICD-10-CM | POA: Diagnosis not present

## 2016-03-10 DIAGNOSIS — J301 Allergic rhinitis due to pollen: Secondary | ICD-10-CM | POA: Diagnosis not present

## 2016-03-10 DIAGNOSIS — J3081 Allergic rhinitis due to animal (cat) (dog) hair and dander: Secondary | ICD-10-CM | POA: Diagnosis not present

## 2016-03-17 DIAGNOSIS — J3089 Other allergic rhinitis: Secondary | ICD-10-CM | POA: Diagnosis not present

## 2016-03-17 DIAGNOSIS — J3081 Allergic rhinitis due to animal (cat) (dog) hair and dander: Secondary | ICD-10-CM | POA: Diagnosis not present

## 2016-03-17 DIAGNOSIS — J301 Allergic rhinitis due to pollen: Secondary | ICD-10-CM | POA: Diagnosis not present

## 2016-03-19 DIAGNOSIS — J3081 Allergic rhinitis due to animal (cat) (dog) hair and dander: Secondary | ICD-10-CM | POA: Diagnosis not present

## 2016-03-19 DIAGNOSIS — J3089 Other allergic rhinitis: Secondary | ICD-10-CM | POA: Diagnosis not present

## 2016-03-24 DIAGNOSIS — J3089 Other allergic rhinitis: Secondary | ICD-10-CM | POA: Diagnosis not present

## 2016-03-24 DIAGNOSIS — J301 Allergic rhinitis due to pollen: Secondary | ICD-10-CM | POA: Diagnosis not present

## 2016-03-24 DIAGNOSIS — J3081 Allergic rhinitis due to animal (cat) (dog) hair and dander: Secondary | ICD-10-CM | POA: Diagnosis not present

## 2016-03-31 DIAGNOSIS — J3081 Allergic rhinitis due to animal (cat) (dog) hair and dander: Secondary | ICD-10-CM | POA: Diagnosis not present

## 2016-03-31 DIAGNOSIS — J301 Allergic rhinitis due to pollen: Secondary | ICD-10-CM | POA: Diagnosis not present

## 2016-03-31 DIAGNOSIS — J3089 Other allergic rhinitis: Secondary | ICD-10-CM | POA: Diagnosis not present

## 2016-04-03 ENCOUNTER — Other Ambulatory Visit: Payer: Self-pay

## 2016-04-07 DIAGNOSIS — J3089 Other allergic rhinitis: Secondary | ICD-10-CM | POA: Diagnosis not present

## 2016-04-07 DIAGNOSIS — J301 Allergic rhinitis due to pollen: Secondary | ICD-10-CM | POA: Diagnosis not present

## 2016-04-07 DIAGNOSIS — J3081 Allergic rhinitis due to animal (cat) (dog) hair and dander: Secondary | ICD-10-CM | POA: Diagnosis not present

## 2016-04-13 ENCOUNTER — Other Ambulatory Visit: Payer: Self-pay | Admitting: Internal Medicine

## 2016-04-14 DIAGNOSIS — J301 Allergic rhinitis due to pollen: Secondary | ICD-10-CM | POA: Diagnosis not present

## 2016-04-14 DIAGNOSIS — J3081 Allergic rhinitis due to animal (cat) (dog) hair and dander: Secondary | ICD-10-CM | POA: Diagnosis not present

## 2016-04-14 DIAGNOSIS — J3089 Other allergic rhinitis: Secondary | ICD-10-CM | POA: Diagnosis not present

## 2016-04-17 ENCOUNTER — Ambulatory Visit (INDEPENDENT_AMBULATORY_CARE_PROVIDER_SITE_OTHER): Payer: Medicare Other | Admitting: Internal Medicine

## 2016-04-17 ENCOUNTER — Encounter: Payer: Self-pay | Admitting: Internal Medicine

## 2016-04-17 VITALS — BP 118/66 | HR 70 | Temp 97.7°F | Wt 132.2 lb

## 2016-04-17 DIAGNOSIS — M25511 Pain in right shoulder: Secondary | ICD-10-CM | POA: Diagnosis not present

## 2016-04-17 DIAGNOSIS — R42 Dizziness and giddiness: Secondary | ICD-10-CM

## 2016-04-17 NOTE — Assessment & Plan Note (Signed)
Seems to have underlying arthritis--and strains it regularly supporting (large) husband Discussed hiring help for evenings also Will set up PT

## 2016-04-17 NOTE — Assessment & Plan Note (Signed)
Doesn't seem to be truly vestibular Not really orthostatic---may be multifactorial Discussed using bedside commode at night

## 2016-04-17 NOTE — Progress Notes (Signed)
Subjective:    Patient ID: [name] [name], female    DOB: 1924-05-23, 82 y.o.   MRN: [id number]  HPI Here due to back and right shoulder pain She uses that arm to support her husband--with his transfers (if he falters) Has aides in AM to help with dressing He tends to be more tired in evening--and more likely to need help This goes back a couple of months---variable Thinks she may benefit from PT No meds for this---rare tylenol  Some dizziness Balance is off at times Mostly in AM or middle of night--has to hold onto things Some sense of movement  Husband's walker there--but she doesn't use it  Current Outpatient Prescriptions on File Prior to Visit  Medication Sig Dispense Refill  . acetaminophen (TYLENOL) 650 MG CR tablet Take 650 mg by mouth 3 (three) times daily.    Marland Kitchen aspirin 81 MG tablet Take 81 mg by mouth daily.    . celecoxib (CELEBREX) 200 MG capsule TAKE 1 CAPSULE DAILY 90 capsule 3  . CVS VITAMIN D3 1000 UNITS capsule TAKE ONE CAPSULE BY MOUTH DAILY 30 capsule 0  . DETROL LA 4 MG 24 hr capsule TAKE 1 CAPSULE DAILY 90 capsule 3  . DiphenhydrAMINE HCl (ALLERGY MED PO) Take 1 tablet by mouth daily.    Marland Kitchen docusate sodium (STOOL SOFTENER) 100 MG capsule Take 100 mg by mouth daily as needed for mild constipation.    . hydrocortisone 2.5 % cream Apply topically 3 (three) times daily as needed. 28 g 3  . Lactobacillus (ACIDOPHILUS) 100 MG CAPS Take by mouth.    . levocetirizine (XYZAL) 5 MG tablet Take 5 mg by mouth every evening.    . Multiple Vitamin (MULTIVITAMIN) tablet Take 1 tablet by mouth daily.    . polyethylene glycol (MIRALAX / GLYCOLAX) packet Take 17 g by mouth daily as needed.    . Probiotic Product (PROBIOTIC ADVANCED PO) Take 1 capsule by mouth daily.    . Simethicone (GAS-X PO) Take by mouth 2 (two) times daily.    . fluconazole (DIFLUCAN) 150 MG tablet Take 1 tablet (150 mg total) by mouth once. (Patient not taking: Reported on 04/17/2016) 2 tablet 1  .  HYDROcodone-acetaminophen (NORCO/VICODIN) 5-325 MG tablet Take 1-2 tablets by mouth every 4 (four) hours as needed (breakthrough pain). (Patient not taking: Reported on 04/17/2016) 30 tablet 0  . Zoster Vaccine Live, PF, (ZOSTAVAX) 60454 UNT/0.65ML injection Inject 19,400 Units into the skin once. 1 each 0   No current facility-administered medications on file prior to visit.     Allergies  Allergen Reactions  . Demerol [Meperidine]     dizzy  . Shellfish Allergy     Past Medical History:  Diagnosis Date  . Allergic rhinitis due to pollen    some shellfish also  . Aortic sclerosis ([place]) 5/12   On echo. no stenosis  . Arthritis   . Diastolic dysfunction    stress echo otherwise normal 1/04. Repeat 5/11 also negative  . History of seasonal allergies   . Hyperlipidemia   . Osteoarthritis, multiple sites   . Osteoporosis   . Overactive bladder   . Urge incontinence     Past Surgical History:  Procedure Laterality Date  . CYSTOCELE REPAIR  2007   and rectocele  . DILATION AND CURETTAGE OF UTERUS    . INGUINAL HERNIA REPAIR  2012   left side with mesh  . TONSILLECTOMY AND ADENOIDECTOMY     as child  .  TOTAL HIP ARTHROPLASTY Right 05/23/2015   Procedure: TOTAL HIP ARTHROPLASTY ANTERIOR APPROACH;  Surgeon: Hessie Knows, MD;  Location: [facility] ORS;  Service: Orthopedics;  Laterality: Right;  . UMBILICAL HERNIA REPAIR  02/04/06    Family History  Problem Relation Age of Onset  . Cancer Father 82    colon cancer  . Heart disease Father     Social History   Social History  . Marital status: Married    Spouse name: N/A  . Number of children: 3  . Years of education: N/A   Occupational History  . 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 Smoking status: Former Research scientist (life sciences)  . Smokeless tobacco: Never Used  . Alcohol use Yes     Comment: wine before dinner  . Drug use: No  . Sexual activity: Not on file   Other Topics  Concern  . Not on file   Social History Narrative   Has living will   Husband then daughter [name], is health care POA   Would accept trial of resuscitation   Would probably accept feeding tube   Review of Systems  Not sick Generally sleeps okay Appetite is fine     Objective:   Physical Exam  Constitutional: She appears well-developed and well-nourished. No distress.  Eyes: EOM are normal.  No nystagmus  Neck: Normal range of motion. Neck supple. No thyromegaly present.  Cardiovascular: Normal rate, regular rhythm and normal heart sounds.  Exam reveals no gallop.   No murmur heard. Pulmonary/Chest: Effort normal and breath sounds normal. No respiratory distress. She has no wheezes. She has no rales.  Musculoskeletal:  Fair ROM in shoulders Marked crepitus in right shoulder--pain especially with external rotation No rotator cuff findings  Lymphadenopathy:    She has no cervical adenopathy.  Neurological: She displays a negative Romberg sign. Coordination and gait normal.  No focal weakness           Assessment & Plan:

## 2016-04-17 NOTE — Progress Notes (Signed)
Pre visit review using our clinic review tool, if applicable. No additional management support is needed unless otherwise documented below in the visit note. 

## 2016-04-21 DIAGNOSIS — J301 Allergic rhinitis due to pollen: Secondary | ICD-10-CM | POA: Diagnosis not present

## 2016-04-21 DIAGNOSIS — J3081 Allergic rhinitis due to animal (cat) (dog) hair and dander: Secondary | ICD-10-CM | POA: Diagnosis not present

## 2016-04-21 DIAGNOSIS — J3089 Other allergic rhinitis: Secondary | ICD-10-CM | POA: Diagnosis not present

## 2016-04-23 DIAGNOSIS — R2681 Unsteadiness on feet: Secondary | ICD-10-CM | POA: Diagnosis not present

## 2016-04-23 DIAGNOSIS — M25511 Pain in right shoulder: Secondary | ICD-10-CM | POA: Diagnosis not present

## 2016-04-23 DIAGNOSIS — M6281 Muscle weakness (generalized): Secondary | ICD-10-CM | POA: Diagnosis not present

## 2016-04-27 DIAGNOSIS — Z85828 Personal history of other malignant neoplasm of skin: Secondary | ICD-10-CM | POA: Diagnosis not present

## 2016-04-27 DIAGNOSIS — I8393 Asymptomatic varicose veins of bilateral lower extremities: Secondary | ICD-10-CM | POA: Diagnosis not present

## 2016-04-27 DIAGNOSIS — L821 Other seborrheic keratosis: Secondary | ICD-10-CM | POA: Diagnosis not present

## 2016-04-27 DIAGNOSIS — L853 Xerosis cutis: Secondary | ICD-10-CM | POA: Diagnosis not present

## 2016-04-27 DIAGNOSIS — Z1283 Encounter for screening for malignant neoplasm of skin: Secondary | ICD-10-CM | POA: Diagnosis not present

## 2016-04-28 DIAGNOSIS — R2681 Unsteadiness on feet: Secondary | ICD-10-CM | POA: Diagnosis not present

## 2016-04-28 DIAGNOSIS — J3089 Other allergic rhinitis: Secondary | ICD-10-CM | POA: Diagnosis not present

## 2016-04-28 DIAGNOSIS — J301 Allergic rhinitis due to pollen: Secondary | ICD-10-CM | POA: Diagnosis not present

## 2016-04-28 DIAGNOSIS — J3081 Allergic rhinitis due to animal (cat) (dog) hair and dander: Secondary | ICD-10-CM | POA: Diagnosis not present

## 2016-04-28 DIAGNOSIS — M6281 Muscle weakness (generalized): Secondary | ICD-10-CM | POA: Diagnosis not present

## 2016-04-28 DIAGNOSIS — M25511 Pain in right shoulder: Secondary | ICD-10-CM | POA: Diagnosis not present

## 2016-04-30 DIAGNOSIS — R2681 Unsteadiness on feet: Secondary | ICD-10-CM | POA: Diagnosis not present

## 2016-04-30 DIAGNOSIS — M25511 Pain in right shoulder: Secondary | ICD-10-CM | POA: Diagnosis not present

## 2016-04-30 DIAGNOSIS — M6281 Muscle weakness (generalized): Secondary | ICD-10-CM | POA: Diagnosis not present

## 2016-05-04 ENCOUNTER — Other Ambulatory Visit: Payer: Self-pay | Admitting: Internal Medicine

## 2016-05-05 DIAGNOSIS — M6281 Muscle weakness (generalized): Secondary | ICD-10-CM | POA: Diagnosis not present

## 2016-05-05 DIAGNOSIS — J3089 Other allergic rhinitis: Secondary | ICD-10-CM | POA: Diagnosis not present

## 2016-05-05 DIAGNOSIS — J3081 Allergic rhinitis due to animal (cat) (dog) hair and dander: Secondary | ICD-10-CM | POA: Diagnosis not present

## 2016-05-05 DIAGNOSIS — R2681 Unsteadiness on feet: Secondary | ICD-10-CM | POA: Diagnosis not present

## 2016-05-05 DIAGNOSIS — J301 Allergic rhinitis due to pollen: Secondary | ICD-10-CM | POA: Diagnosis not present

## 2016-05-05 DIAGNOSIS — M25511 Pain in right shoulder: Secondary | ICD-10-CM | POA: Diagnosis not present

## 2016-05-07 DIAGNOSIS — R2681 Unsteadiness on feet: Secondary | ICD-10-CM | POA: Diagnosis not present

## 2016-05-07 DIAGNOSIS — M6281 Muscle weakness (generalized): Secondary | ICD-10-CM | POA: Diagnosis not present

## 2016-05-07 DIAGNOSIS — M25511 Pain in right shoulder: Secondary | ICD-10-CM | POA: Diagnosis not present

## 2016-05-12 DIAGNOSIS — J301 Allergic rhinitis due to pollen: Secondary | ICD-10-CM | POA: Diagnosis not present

## 2016-05-12 DIAGNOSIS — J3089 Other allergic rhinitis: Secondary | ICD-10-CM | POA: Diagnosis not present

## 2016-05-12 DIAGNOSIS — J3081 Allergic rhinitis due to animal (cat) (dog) hair and dander: Secondary | ICD-10-CM | POA: Diagnosis not present

## 2016-05-12 DIAGNOSIS — M25511 Pain in right shoulder: Secondary | ICD-10-CM | POA: Diagnosis not present

## 2016-05-12 DIAGNOSIS — M6281 Muscle weakness (generalized): Secondary | ICD-10-CM | POA: Diagnosis not present

## 2016-05-12 DIAGNOSIS — R2681 Unsteadiness on feet: Secondary | ICD-10-CM | POA: Diagnosis not present

## 2016-05-14 DIAGNOSIS — R2681 Unsteadiness on feet: Secondary | ICD-10-CM | POA: Diagnosis not present

## 2016-05-14 DIAGNOSIS — M6281 Muscle weakness (generalized): Secondary | ICD-10-CM | POA: Diagnosis not present

## 2016-05-14 DIAGNOSIS — M25511 Pain in right shoulder: Secondary | ICD-10-CM | POA: Diagnosis not present

## 2016-05-19 DIAGNOSIS — M6281 Muscle weakness (generalized): Secondary | ICD-10-CM | POA: Diagnosis not present

## 2016-05-19 DIAGNOSIS — J3089 Other allergic rhinitis: Secondary | ICD-10-CM | POA: Diagnosis not present

## 2016-05-19 DIAGNOSIS — M25511 Pain in right shoulder: Secondary | ICD-10-CM | POA: Diagnosis not present

## 2016-05-19 DIAGNOSIS — J301 Allergic rhinitis due to pollen: Secondary | ICD-10-CM | POA: Diagnosis not present

## 2016-05-19 DIAGNOSIS — J3081 Allergic rhinitis due to animal (cat) (dog) hair and dander: Secondary | ICD-10-CM | POA: Diagnosis not present

## 2016-05-19 DIAGNOSIS — R2681 Unsteadiness on feet: Secondary | ICD-10-CM | POA: Diagnosis not present

## 2016-05-21 DIAGNOSIS — R2681 Unsteadiness on feet: Secondary | ICD-10-CM | POA: Diagnosis not present

## 2016-05-21 DIAGNOSIS — M25511 Pain in right shoulder: Secondary | ICD-10-CM | POA: Diagnosis not present

## 2016-05-21 DIAGNOSIS — M6281 Muscle weakness (generalized): Secondary | ICD-10-CM | POA: Diagnosis not present

## 2016-05-26 DIAGNOSIS — J301 Allergic rhinitis due to pollen: Secondary | ICD-10-CM | POA: Diagnosis not present

## 2016-05-26 DIAGNOSIS — J3081 Allergic rhinitis due to animal (cat) (dog) hair and dander: Secondary | ICD-10-CM | POA: Diagnosis not present

## 2016-05-26 DIAGNOSIS — M6281 Muscle weakness (generalized): Secondary | ICD-10-CM | POA: Diagnosis not present

## 2016-05-26 DIAGNOSIS — J3089 Other allergic rhinitis: Secondary | ICD-10-CM | POA: Diagnosis not present

## 2016-05-26 DIAGNOSIS — M25511 Pain in right shoulder: Secondary | ICD-10-CM | POA: Diagnosis not present

## 2016-05-26 DIAGNOSIS — R2681 Unsteadiness on feet: Secondary | ICD-10-CM | POA: Diagnosis not present

## 2016-05-28 DIAGNOSIS — M6281 Muscle weakness (generalized): Secondary | ICD-10-CM | POA: Diagnosis not present

## 2016-05-28 DIAGNOSIS — R2681 Unsteadiness on feet: Secondary | ICD-10-CM | POA: Diagnosis not present

## 2016-05-28 DIAGNOSIS — Z23 Encounter for immunization: Secondary | ICD-10-CM | POA: Diagnosis not present

## 2016-05-28 DIAGNOSIS — M25511 Pain in right shoulder: Secondary | ICD-10-CM | POA: Diagnosis not present

## 2016-06-02 DIAGNOSIS — M6281 Muscle weakness (generalized): Secondary | ICD-10-CM | POA: Diagnosis not present

## 2016-06-02 DIAGNOSIS — J3089 Other allergic rhinitis: Secondary | ICD-10-CM | POA: Diagnosis not present

## 2016-06-02 DIAGNOSIS — R2681 Unsteadiness on feet: Secondary | ICD-10-CM | POA: Diagnosis not present

## 2016-06-02 DIAGNOSIS — J301 Allergic rhinitis due to pollen: Secondary | ICD-10-CM | POA: Diagnosis not present

## 2016-06-02 DIAGNOSIS — M25511 Pain in right shoulder: Secondary | ICD-10-CM | POA: Diagnosis not present

## 2016-06-02 DIAGNOSIS — J3081 Allergic rhinitis due to animal (cat) (dog) hair and dander: Secondary | ICD-10-CM | POA: Diagnosis not present

## 2016-06-04 DIAGNOSIS — M25511 Pain in right shoulder: Secondary | ICD-10-CM | POA: Diagnosis not present

## 2016-06-04 DIAGNOSIS — R2681 Unsteadiness on feet: Secondary | ICD-10-CM | POA: Diagnosis not present

## 2016-06-04 DIAGNOSIS — M6281 Muscle weakness (generalized): Secondary | ICD-10-CM | POA: Diagnosis not present

## 2016-06-09 DIAGNOSIS — J3089 Other allergic rhinitis: Secondary | ICD-10-CM | POA: Diagnosis not present

## 2016-06-09 DIAGNOSIS — J301 Allergic rhinitis due to pollen: Secondary | ICD-10-CM | POA: Diagnosis not present

## 2016-06-09 DIAGNOSIS — J3081 Allergic rhinitis due to animal (cat) (dog) hair and dander: Secondary | ICD-10-CM | POA: Diagnosis not present

## 2016-06-16 DIAGNOSIS — M25511 Pain in right shoulder: Secondary | ICD-10-CM | POA: Diagnosis not present

## 2016-06-16 DIAGNOSIS — M6281 Muscle weakness (generalized): Secondary | ICD-10-CM | POA: Diagnosis not present

## 2016-06-16 DIAGNOSIS — R2681 Unsteadiness on feet: Secondary | ICD-10-CM | POA: Diagnosis not present

## 2016-06-16 DIAGNOSIS — J3089 Other allergic rhinitis: Secondary | ICD-10-CM | POA: Diagnosis not present

## 2016-06-16 DIAGNOSIS — J301 Allergic rhinitis due to pollen: Secondary | ICD-10-CM | POA: Diagnosis not present

## 2016-06-18 DIAGNOSIS — R2681 Unsteadiness on feet: Secondary | ICD-10-CM | POA: Diagnosis not present

## 2016-06-18 DIAGNOSIS — M6281 Muscle weakness (generalized): Secondary | ICD-10-CM | POA: Diagnosis not present

## 2016-06-18 DIAGNOSIS — J301 Allergic rhinitis due to pollen: Secondary | ICD-10-CM | POA: Diagnosis not present

## 2016-06-18 DIAGNOSIS — M25511 Pain in right shoulder: Secondary | ICD-10-CM | POA: Diagnosis not present

## 2016-06-21 DIAGNOSIS — M25511 Pain in right shoulder: Secondary | ICD-10-CM | POA: Diagnosis not present

## 2016-06-21 DIAGNOSIS — R2681 Unsteadiness on feet: Secondary | ICD-10-CM | POA: Diagnosis not present

## 2016-06-21 DIAGNOSIS — M6281 Muscle weakness (generalized): Secondary | ICD-10-CM | POA: Diagnosis not present

## 2016-06-23 DIAGNOSIS — J3089 Other allergic rhinitis: Secondary | ICD-10-CM | POA: Diagnosis not present

## 2016-06-23 DIAGNOSIS — R2681 Unsteadiness on feet: Secondary | ICD-10-CM | POA: Diagnosis not present

## 2016-06-23 DIAGNOSIS — M6281 Muscle weakness (generalized): Secondary | ICD-10-CM | POA: Diagnosis not present

## 2016-06-23 DIAGNOSIS — J301 Allergic rhinitis due to pollen: Secondary | ICD-10-CM | POA: Diagnosis not present

## 2016-06-23 DIAGNOSIS — J3081 Allergic rhinitis due to animal (cat) (dog) hair and dander: Secondary | ICD-10-CM | POA: Diagnosis not present

## 2016-06-23 DIAGNOSIS — M25511 Pain in right shoulder: Secondary | ICD-10-CM | POA: Diagnosis not present

## 2016-06-30 DIAGNOSIS — J3081 Allergic rhinitis due to animal (cat) (dog) hair and dander: Secondary | ICD-10-CM | POA: Diagnosis not present

## 2016-06-30 DIAGNOSIS — J301 Allergic rhinitis due to pollen: Secondary | ICD-10-CM | POA: Diagnosis not present

## 2016-06-30 DIAGNOSIS — J3089 Other allergic rhinitis: Secondary | ICD-10-CM | POA: Diagnosis not present

## 2016-07-07 DIAGNOSIS — J3089 Other allergic rhinitis: Secondary | ICD-10-CM | POA: Diagnosis not present

## 2016-07-07 DIAGNOSIS — J301 Allergic rhinitis due to pollen: Secondary | ICD-10-CM | POA: Diagnosis not present

## 2016-07-07 DIAGNOSIS — J3081 Allergic rhinitis due to animal (cat) (dog) hair and dander: Secondary | ICD-10-CM | POA: Diagnosis not present

## 2016-07-08 DIAGNOSIS — M25511 Pain in right shoulder: Secondary | ICD-10-CM | POA: Diagnosis not present

## 2016-07-08 DIAGNOSIS — M12811 Other specific arthropathies, not elsewhere classified, right shoulder: Secondary | ICD-10-CM | POA: Diagnosis not present

## 2016-07-14 DIAGNOSIS — J301 Allergic rhinitis due to pollen: Secondary | ICD-10-CM | POA: Diagnosis not present

## 2016-07-14 DIAGNOSIS — J3089 Other allergic rhinitis: Secondary | ICD-10-CM | POA: Diagnosis not present

## 2016-07-14 DIAGNOSIS — J3081 Allergic rhinitis due to animal (cat) (dog) hair and dander: Secondary | ICD-10-CM | POA: Diagnosis not present

## 2016-07-16 IMAGING — CR DG HIP (WITH PELVIS) OPERATIVE*R*
2 series · 2 of 2 positions shown · non-contrast
Comparison: None.

CLINICAL DATA: Right hip replacement

EXAM:
OPERATIVE RIGHT HIP (WITH PELVIS IF PERFORMED) 2 VIEWS
TECHNIQUE: Fluoroscopic spot image(s) were submitted for interpretation
post-operatively.

[cont. (1 of 2)]
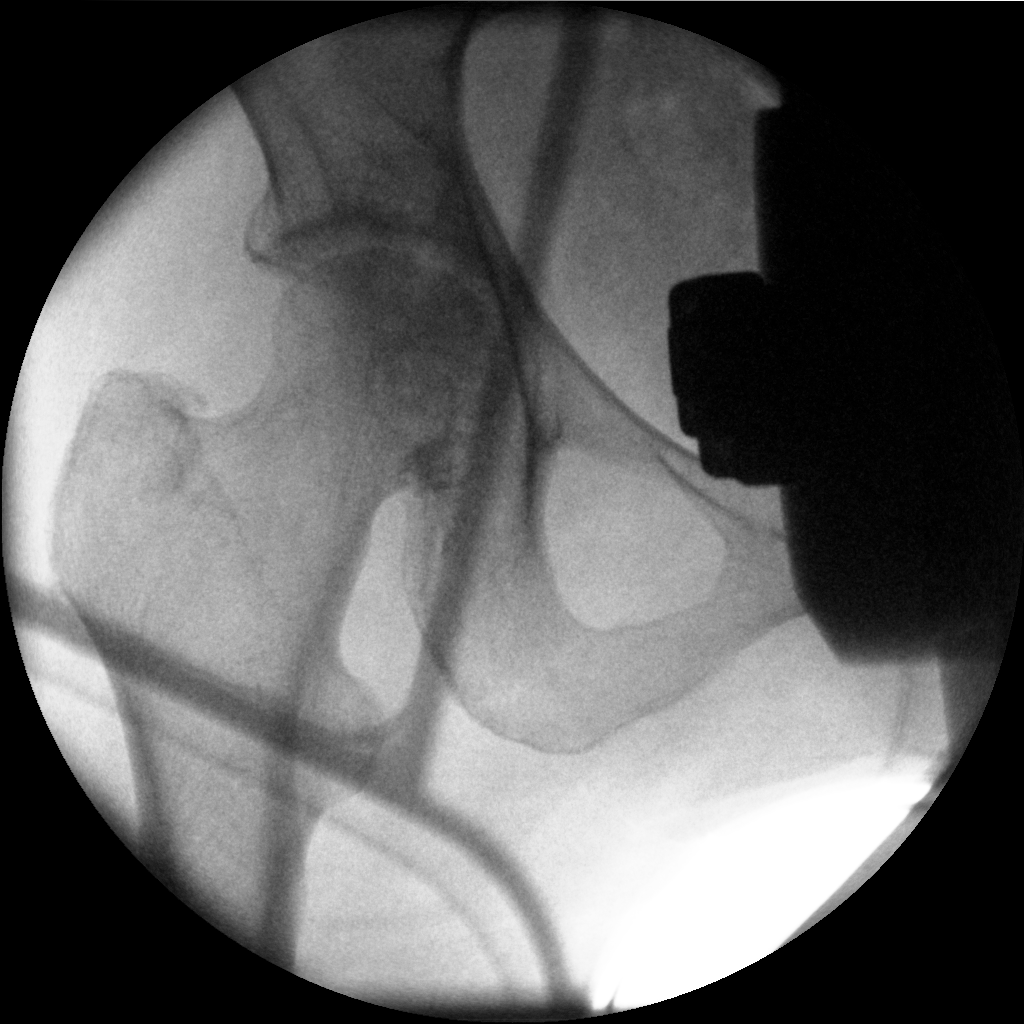

[cont. (2 of 2)]
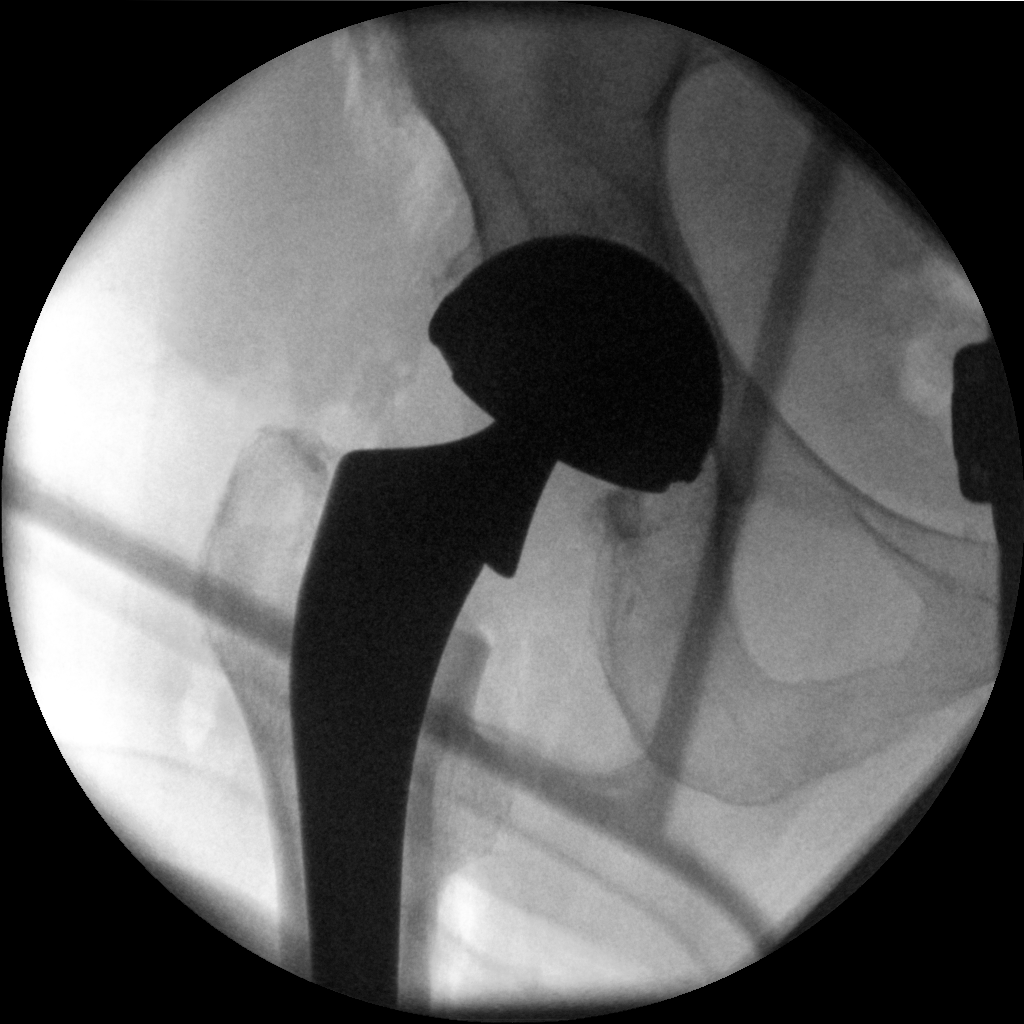

[2 of 2 positions shown; findings below may reference images not displayed]

FINDINGS: Femoral and acetabular components of the total hip replacement on
the right are seen in anticipated position.

Fluoro time:  0 minutes 26 seconds
IMPRESSION: Anticipated postoperative appearance

## 2016-07-16 IMAGING — CR DG HIP (WITH OR WITHOUT PELVIS) 2-3V*R*
1 series · 2 of 2 positions shown · non-contrast
Comparison: None.

CLINICAL DATA: Status post right hip replacement.

EXAM:
DG HIP (WITH OR WITHOUT PELVIS) 2-3V RIGHT

[Series 1: ap · 0.17mm/px · 2 of 2 slices shown]
[im 1/2]
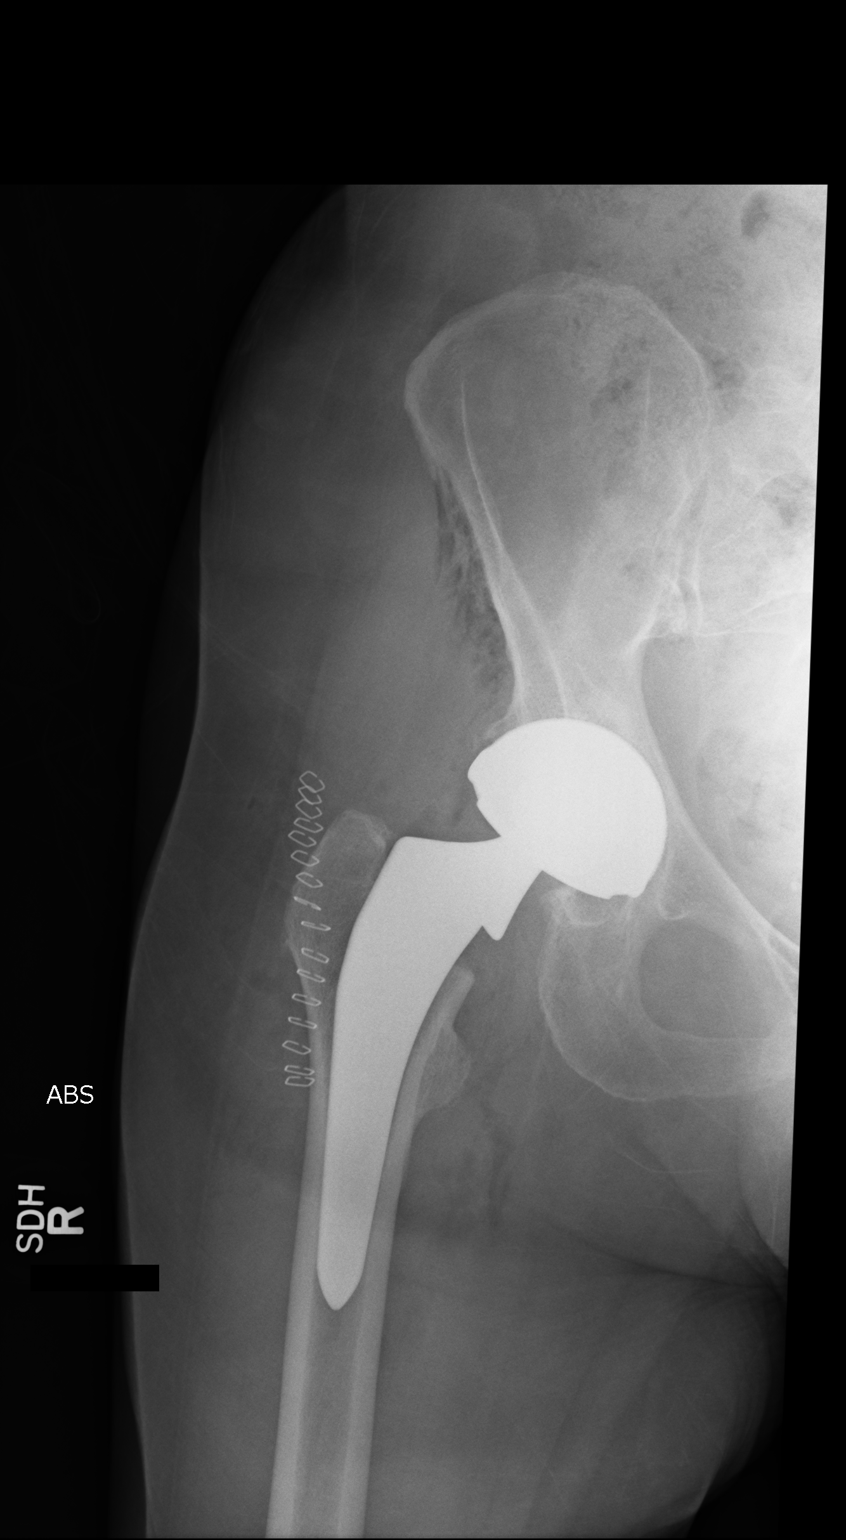
[im 2/2]
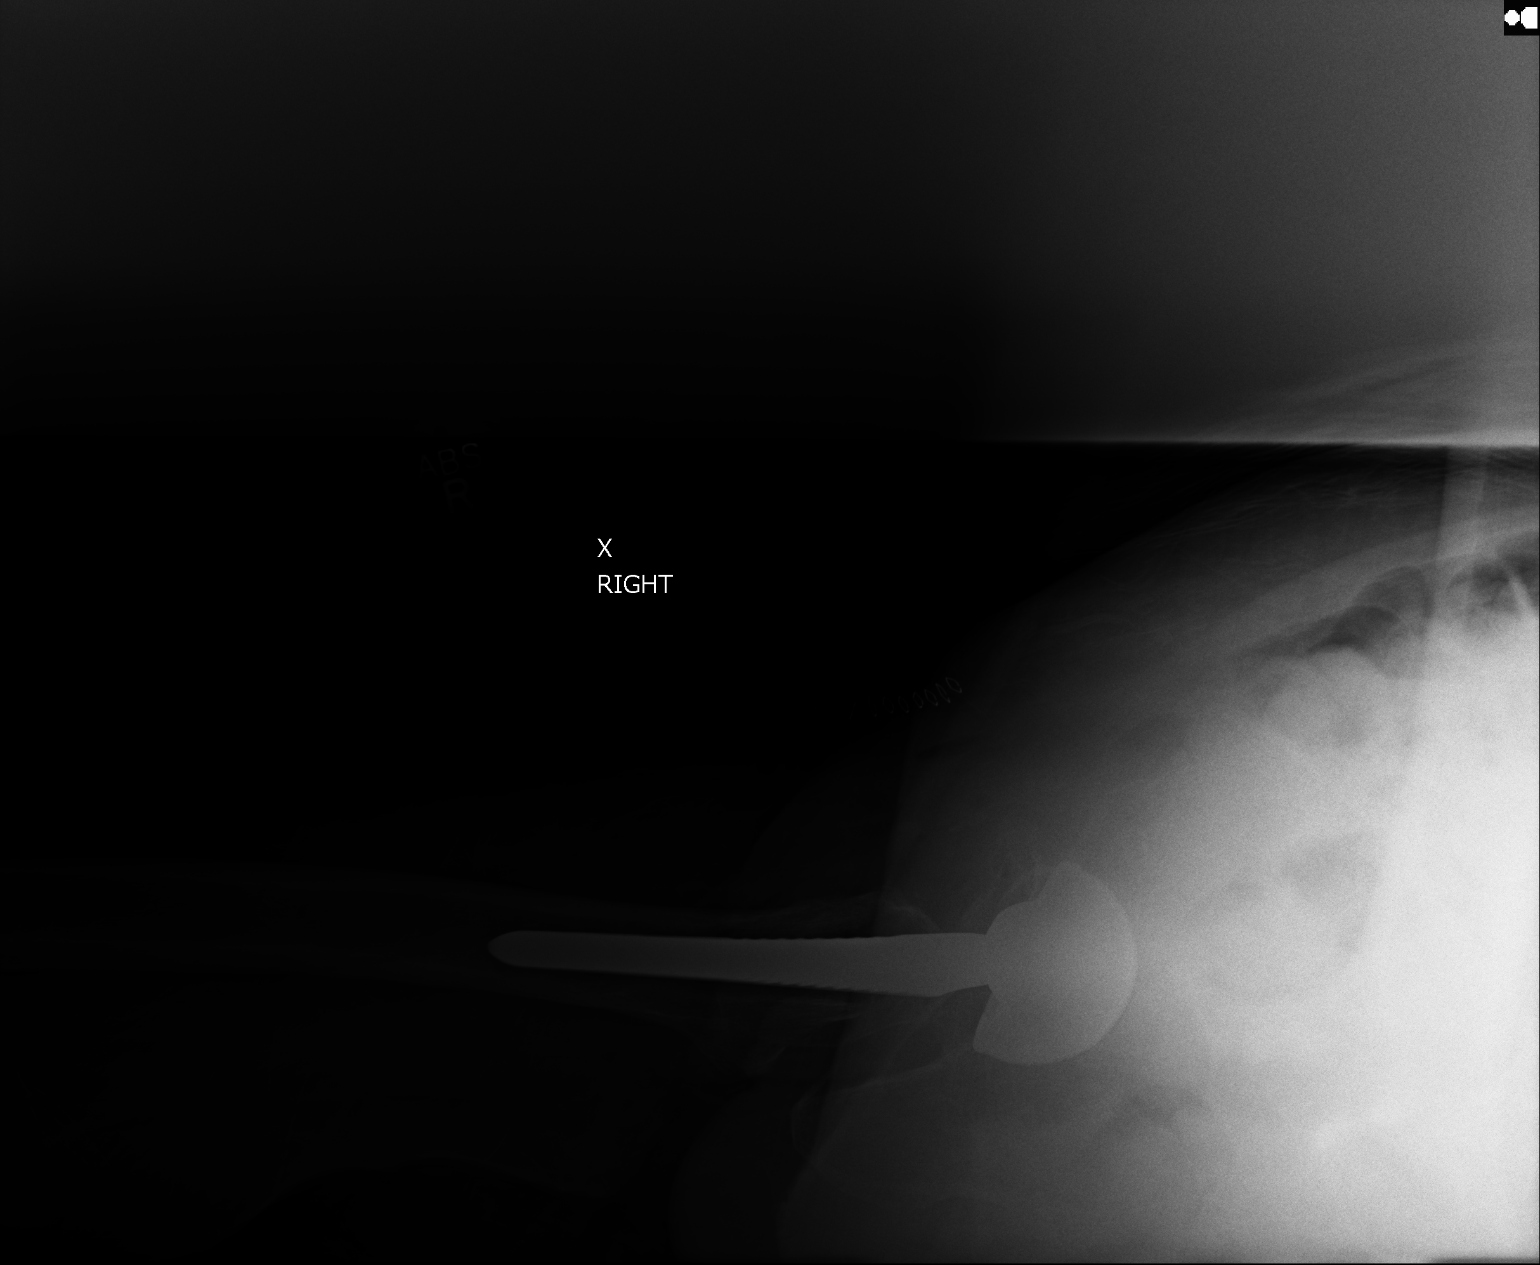

[2 of 2 positions shown; findings below may reference images not displayed]

FINDINGS: The femoral and acetabular components appear to be well situated. No
fracture or dislocation is noted. Expected postoperative changes are
seen in the surrounding soft tissues.
IMPRESSION: Status post right total hip replacement.

## 2016-07-17 DIAGNOSIS — M25511 Pain in right shoulder: Secondary | ICD-10-CM | POA: Diagnosis not present

## 2016-07-17 DIAGNOSIS — M6281 Muscle weakness (generalized): Secondary | ICD-10-CM | POA: Diagnosis not present

## 2016-07-20 DIAGNOSIS — H401121 Primary open-angle glaucoma, left eye, mild stage: Secondary | ICD-10-CM | POA: Diagnosis not present

## 2016-07-21 DIAGNOSIS — J3089 Other allergic rhinitis: Secondary | ICD-10-CM | POA: Diagnosis not present

## 2016-07-21 DIAGNOSIS — J3081 Allergic rhinitis due to animal (cat) (dog) hair and dander: Secondary | ICD-10-CM | POA: Diagnosis not present

## 2016-07-21 DIAGNOSIS — J301 Allergic rhinitis due to pollen: Secondary | ICD-10-CM | POA: Diagnosis not present

## 2016-07-22 DIAGNOSIS — M25511 Pain in right shoulder: Secondary | ICD-10-CM | POA: Diagnosis not present

## 2016-07-22 DIAGNOSIS — M6281 Muscle weakness (generalized): Secondary | ICD-10-CM | POA: Diagnosis not present

## 2016-07-22 DIAGNOSIS — H401121 Primary open-angle glaucoma, left eye, mild stage: Secondary | ICD-10-CM | POA: Diagnosis not present

## 2016-07-24 DIAGNOSIS — M6281 Muscle weakness (generalized): Secondary | ICD-10-CM | POA: Diagnosis not present

## 2016-07-24 DIAGNOSIS — M25511 Pain in right shoulder: Secondary | ICD-10-CM | POA: Diagnosis not present

## 2016-07-28 DIAGNOSIS — J301 Allergic rhinitis due to pollen: Secondary | ICD-10-CM | POA: Diagnosis not present

## 2016-07-28 DIAGNOSIS — J3081 Allergic rhinitis due to animal (cat) (dog) hair and dander: Secondary | ICD-10-CM | POA: Diagnosis not present

## 2016-07-28 DIAGNOSIS — J3089 Other allergic rhinitis: Secondary | ICD-10-CM | POA: Diagnosis not present

## 2016-07-29 DIAGNOSIS — M25511 Pain in right shoulder: Secondary | ICD-10-CM | POA: Diagnosis not present

## 2016-07-29 DIAGNOSIS — M6281 Muscle weakness (generalized): Secondary | ICD-10-CM | POA: Diagnosis not present

## 2016-07-30 DIAGNOSIS — M6281 Muscle weakness (generalized): Secondary | ICD-10-CM | POA: Diagnosis not present

## 2016-07-30 DIAGNOSIS — M25511 Pain in right shoulder: Secondary | ICD-10-CM | POA: Diagnosis not present

## 2016-08-04 DIAGNOSIS — J301 Allergic rhinitis due to pollen: Secondary | ICD-10-CM | POA: Diagnosis not present

## 2016-08-04 DIAGNOSIS — J3081 Allergic rhinitis due to animal (cat) (dog) hair and dander: Secondary | ICD-10-CM | POA: Diagnosis not present

## 2016-08-04 DIAGNOSIS — M25511 Pain in right shoulder: Secondary | ICD-10-CM | POA: Diagnosis not present

## 2016-08-04 DIAGNOSIS — R2681 Unsteadiness on feet: Secondary | ICD-10-CM | POA: Diagnosis not present

## 2016-08-04 DIAGNOSIS — M6281 Muscle weakness (generalized): Secondary | ICD-10-CM | POA: Diagnosis not present

## 2016-08-04 DIAGNOSIS — J3089 Other allergic rhinitis: Secondary | ICD-10-CM | POA: Diagnosis not present

## 2016-08-06 DIAGNOSIS — M25511 Pain in right shoulder: Secondary | ICD-10-CM | POA: Diagnosis not present

## 2016-08-06 DIAGNOSIS — M6281 Muscle weakness (generalized): Secondary | ICD-10-CM | POA: Diagnosis not present

## 2016-08-06 DIAGNOSIS — R2681 Unsteadiness on feet: Secondary | ICD-10-CM | POA: Diagnosis not present

## 2016-08-11 DIAGNOSIS — J301 Allergic rhinitis due to pollen: Secondary | ICD-10-CM | POA: Diagnosis not present

## 2016-08-11 DIAGNOSIS — J3081 Allergic rhinitis due to animal (cat) (dog) hair and dander: Secondary | ICD-10-CM | POA: Diagnosis not present

## 2016-08-11 DIAGNOSIS — J3089 Other allergic rhinitis: Secondary | ICD-10-CM | POA: Diagnosis not present

## 2016-08-18 DIAGNOSIS — J3081 Allergic rhinitis due to animal (cat) (dog) hair and dander: Secondary | ICD-10-CM | POA: Diagnosis not present

## 2016-08-18 DIAGNOSIS — J301 Allergic rhinitis due to pollen: Secondary | ICD-10-CM | POA: Diagnosis not present

## 2016-08-18 DIAGNOSIS — J3089 Other allergic rhinitis: Secondary | ICD-10-CM | POA: Diagnosis not present

## 2016-08-27 DIAGNOSIS — J3089 Other allergic rhinitis: Secondary | ICD-10-CM | POA: Diagnosis not present

## 2016-08-27 DIAGNOSIS — J301 Allergic rhinitis due to pollen: Secondary | ICD-10-CM | POA: Diagnosis not present

## 2016-08-27 DIAGNOSIS — J3081 Allergic rhinitis due to animal (cat) (dog) hair and dander: Secondary | ICD-10-CM | POA: Diagnosis not present

## 2016-08-28 ENCOUNTER — Encounter (INDEPENDENT_AMBULATORY_CARE_PROVIDER_SITE_OTHER): Payer: Self-pay

## 2016-08-28 ENCOUNTER — Encounter: Payer: Self-pay | Admitting: Internal Medicine

## 2016-08-28 ENCOUNTER — Ambulatory Visit (INDEPENDENT_AMBULATORY_CARE_PROVIDER_SITE_OTHER): Payer: Medicare Other | Admitting: Internal Medicine

## 2016-08-28 VITALS — BP 128/70 | HR 72 | Temp 98.1°F | Wt 132.0 lb

## 2016-08-28 DIAGNOSIS — R0781 Pleurodynia: Secondary | ICD-10-CM

## 2016-08-28 NOTE — Progress Notes (Signed)
Subjective:    Patient ID: [name] [name], female    DOB: Jul 13, 1924, 83 y.o.   MRN: [id number]  HPI  Here due to fall and injury [place] going out to get the mail--- 4 days ago [address] flat onto her stomach--may have reduced the fall slightly with her hands She couldn't get up Someone came out ~10 minutes later and helped her up Went inside and sat down then Pain not that bad initially  Noticed increased pain under left breast 1-2 days later Especially bad when lying in bed No chest pain otherwise No SOB  Tylenol not helping much Hasn't tried the hydrocodone  Current Outpatient Prescriptions on File Prior to Visit  Medication Sig Dispense Refill  . acetaminophen (TYLENOL) 650 MG CR tablet Take 650 mg by mouth 3 (three) times daily.    Marland Kitchen aspirin 81 MG tablet Take 81 mg by mouth daily.    . celecoxib (CELEBREX) 200 MG capsule TAKE 1 CAPSULE DAILY 90 capsule 3  . CVS VITAMIN D3 1000 UNITS capsule TAKE ONE CAPSULE BY MOUTH DAILY 30 capsule 0  . DiphenhydrAMINE HCl (ALLERGY MED PO) Take 1 tablet by mouth daily.    Marland Kitchen docusate sodium (STOOL SOFTENER) 100 MG capsule Take 100 mg by mouth daily as needed for mild constipation.    Marland Kitchen HYDROcodone-acetaminophen (NORCO/VICODIN) 5-325 MG tablet Take 1-2 tablets by mouth every 4 (four) hours as needed (breakthrough pain). 30 tablet 0  . hydrocortisone 2.5 % cream Apply topically 3 (three) times daily as needed. 28 g 3  . Lactobacillus (ACIDOPHILUS) 100 MG CAPS Take by mouth.    . levocetirizine (XYZAL) 5 MG tablet Take 5 mg by mouth every evening.    . Multiple Vitamin (MULTIVITAMIN) tablet Take 1 tablet by mouth daily.    . polyethylene glycol (MIRALAX / GLYCOLAX) packet Take 17 g by mouth daily as needed.    . Probiotic Product (PROBIOTIC ADVANCED PO) Take 1 capsule by mouth daily.    . Simethicone (GAS-X PO) Take by mouth 2 (two) times daily.    Marland Kitchen tolterodine (DETROL LA) 4 MG 24 hr capsule TAKE 1 CAPSULE DAILY 90 capsule 3   No current  facility-administered medications on file prior to visit.     Allergies  Allergen Reactions  . Demerol [Meperidine]     dizzy  . Shellfish Allergy     Past Medical History:  Diagnosis Date  . Allergic rhinitis due to pollen    some shellfish also  . Aortic sclerosis 5/12   On echo. no stenosis  . Arthritis   . Diastolic dysfunction    stress echo otherwise normal 1/04. Repeat 5/11 also negative  . History of seasonal allergies   . Hyperlipidemia   . Osteoarthritis, multiple sites   . Osteoporosis   . Overactive bladder   . Urge incontinence     Past Surgical History:  Procedure Laterality Date  . CYSTOCELE REPAIR  2007   and rectocele  . DILATION AND CURETTAGE OF UTERUS    . INGUINAL HERNIA REPAIR  2012   left side with mesh  . TONSILLECTOMY AND ADENOIDECTOMY     as child  . TOTAL HIP ARTHROPLASTY Right 05/23/2015   Procedure: TOTAL HIP ARTHROPLASTY ANTERIOR APPROACH;  Surgeon: Hessie Knows, MD;  Location: [facility] ORS;  Service: Orthopedics;  Laterality: Right;  . UMBILICAL HERNIA REPAIR  02/04/06    Family History  Problem Relation Age of Onset  . Cancer Father 91    colon cancer  .  Heart disease Father     Social History   Social History  . Marital status: Married    Spouse name: N/A  . Number of children: 3  . Years of education: N/A   Occupational History  . 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 Smoking status: Former Research scientist (life sciences)  . Smokeless tobacco: Never Used  . Alcohol use Yes     Comment: wine before dinner  . Drug use: No  . Sexual activity: Not on file   Other Topics Concern  . Not on file   Social History Narrative   Has living will   Husband then daughter [name], is health care POA   Would accept trial of resuscitation   Would probably accept feeding tube   Review of Systems Doesn't wear glasses regularly (only reading) Appetite is okay Mild fatigue--nothing different      Objective:   Physical Exam  Constitutional: No distress.  Cardiovascular: Normal rate, regular rhythm and normal heart sounds.  Exam reveals no gallop.   No murmur heard. Pulmonary/Chest: Effort normal and breath sounds normal. No respiratory distress. She has no wheezes. She has no rales.  Broad based tenderness along T9-12 on left medially to anterior axillary line. No severe tenderness or obvious bony abnormality  Abdominal: Soft. There is no tenderness.          Assessment & Plan:

## 2016-08-28 NOTE — Assessment & Plan Note (Signed)
After fall Seems to be just contusion 4 days ago and doing okay Advised using the hydrocodone at night until the pain is better

## 2016-08-28 NOTE — Progress Notes (Signed)
Pre visit review using our clinic review tool, if applicable. No additional management support is needed unless otherwise documented below in the visit note. 

## 2016-09-03 DIAGNOSIS — J301 Allergic rhinitis due to pollen: Secondary | ICD-10-CM | POA: Diagnosis not present

## 2016-09-03 DIAGNOSIS — J3081 Allergic rhinitis due to animal (cat) (dog) hair and dander: Secondary | ICD-10-CM | POA: Diagnosis not present

## 2016-09-03 DIAGNOSIS — J3089 Other allergic rhinitis: Secondary | ICD-10-CM | POA: Diagnosis not present

## 2016-09-08 DIAGNOSIS — J301 Allergic rhinitis due to pollen: Secondary | ICD-10-CM | POA: Diagnosis not present

## 2016-09-08 DIAGNOSIS — J3081 Allergic rhinitis due to animal (cat) (dog) hair and dander: Secondary | ICD-10-CM | POA: Diagnosis not present

## 2016-09-08 DIAGNOSIS — J3089 Other allergic rhinitis: Secondary | ICD-10-CM | POA: Diagnosis not present

## 2016-09-10 DIAGNOSIS — J3089 Other allergic rhinitis: Secondary | ICD-10-CM | POA: Diagnosis not present

## 2016-09-10 DIAGNOSIS — J3081 Allergic rhinitis due to animal (cat) (dog) hair and dander: Secondary | ICD-10-CM | POA: Diagnosis not present

## 2016-09-15 DIAGNOSIS — J3089 Other allergic rhinitis: Secondary | ICD-10-CM | POA: Diagnosis not present

## 2016-09-15 DIAGNOSIS — J301 Allergic rhinitis due to pollen: Secondary | ICD-10-CM | POA: Diagnosis not present

## 2016-09-22 DIAGNOSIS — J3081 Allergic rhinitis due to animal (cat) (dog) hair and dander: Secondary | ICD-10-CM | POA: Diagnosis not present

## 2016-09-22 DIAGNOSIS — J3089 Other allergic rhinitis: Secondary | ICD-10-CM | POA: Diagnosis not present

## 2016-09-22 DIAGNOSIS — J301 Allergic rhinitis due to pollen: Secondary | ICD-10-CM | POA: Diagnosis not present

## 2016-09-29 DIAGNOSIS — J3081 Allergic rhinitis due to animal (cat) (dog) hair and dander: Secondary | ICD-10-CM | POA: Diagnosis not present

## 2016-09-29 DIAGNOSIS — J3089 Other allergic rhinitis: Secondary | ICD-10-CM | POA: Diagnosis not present

## 2016-09-29 DIAGNOSIS — J301 Allergic rhinitis due to pollen: Secondary | ICD-10-CM | POA: Diagnosis not present

## 2016-10-06 DIAGNOSIS — J301 Allergic rhinitis due to pollen: Secondary | ICD-10-CM | POA: Diagnosis not present

## 2016-10-06 DIAGNOSIS — J3081 Allergic rhinitis due to animal (cat) (dog) hair and dander: Secondary | ICD-10-CM | POA: Diagnosis not present

## 2016-10-06 DIAGNOSIS — J3089 Other allergic rhinitis: Secondary | ICD-10-CM | POA: Diagnosis not present

## 2016-10-13 DIAGNOSIS — J3089 Other allergic rhinitis: Secondary | ICD-10-CM | POA: Diagnosis not present

## 2016-10-13 DIAGNOSIS — J301 Allergic rhinitis due to pollen: Secondary | ICD-10-CM | POA: Diagnosis not present

## 2016-10-13 DIAGNOSIS — J3081 Allergic rhinitis due to animal (cat) (dog) hair and dander: Secondary | ICD-10-CM | POA: Diagnosis not present

## 2016-10-14 DIAGNOSIS — M12811 Other specific arthropathies, not elsewhere classified, right shoulder: Secondary | ICD-10-CM | POA: Diagnosis not present

## 2016-10-20 DIAGNOSIS — J3089 Other allergic rhinitis: Secondary | ICD-10-CM | POA: Diagnosis not present

## 2016-10-20 DIAGNOSIS — J3081 Allergic rhinitis due to animal (cat) (dog) hair and dander: Secondary | ICD-10-CM | POA: Diagnosis not present

## 2016-10-20 DIAGNOSIS — J301 Allergic rhinitis due to pollen: Secondary | ICD-10-CM | POA: Diagnosis not present

## 2016-10-27 DIAGNOSIS — J301 Allergic rhinitis due to pollen: Secondary | ICD-10-CM | POA: Diagnosis not present

## 2016-10-27 DIAGNOSIS — J3089 Other allergic rhinitis: Secondary | ICD-10-CM | POA: Diagnosis not present

## 2016-10-27 DIAGNOSIS — J3081 Allergic rhinitis due to animal (cat) (dog) hair and dander: Secondary | ICD-10-CM | POA: Diagnosis not present

## 2016-11-03 DIAGNOSIS — J3089 Other allergic rhinitis: Secondary | ICD-10-CM | POA: Diagnosis not present

## 2016-11-03 DIAGNOSIS — J301 Allergic rhinitis due to pollen: Secondary | ICD-10-CM | POA: Diagnosis not present

## 2016-11-03 DIAGNOSIS — J3081 Allergic rhinitis due to animal (cat) (dog) hair and dander: Secondary | ICD-10-CM | POA: Diagnosis not present

## 2016-11-05 DIAGNOSIS — J301 Allergic rhinitis due to pollen: Secondary | ICD-10-CM | POA: Diagnosis not present

## 2016-11-10 DIAGNOSIS — Z85828 Personal history of other malignant neoplasm of skin: Secondary | ICD-10-CM | POA: Diagnosis not present

## 2016-11-10 DIAGNOSIS — L820 Inflamed seborrheic keratosis: Secondary | ICD-10-CM | POA: Diagnosis not present

## 2016-11-10 DIAGNOSIS — J301 Allergic rhinitis due to pollen: Secondary | ICD-10-CM | POA: Diagnosis not present

## 2016-11-10 DIAGNOSIS — J3081 Allergic rhinitis due to animal (cat) (dog) hair and dander: Secondary | ICD-10-CM | POA: Diagnosis not present

## 2016-11-10 DIAGNOSIS — J3089 Other allergic rhinitis: Secondary | ICD-10-CM | POA: Diagnosis not present

## 2016-11-10 DIAGNOSIS — Z1283 Encounter for screening for malignant neoplasm of skin: Secondary | ICD-10-CM | POA: Diagnosis not present

## 2016-11-10 DIAGNOSIS — L299 Pruritus, unspecified: Secondary | ICD-10-CM | POA: Diagnosis not present

## 2016-11-10 DIAGNOSIS — L821 Other seborrheic keratosis: Secondary | ICD-10-CM | POA: Diagnosis not present

## 2016-11-10 DIAGNOSIS — D485 Neoplasm of uncertain behavior of skin: Secondary | ICD-10-CM | POA: Diagnosis not present

## 2016-11-10 DIAGNOSIS — C4401 Basal cell carcinoma of skin of lip: Secondary | ICD-10-CM | POA: Diagnosis not present

## 2016-11-17 DIAGNOSIS — J3089 Other allergic rhinitis: Secondary | ICD-10-CM | POA: Diagnosis not present

## 2016-11-17 DIAGNOSIS — J301 Allergic rhinitis due to pollen: Secondary | ICD-10-CM | POA: Diagnosis not present

## 2016-11-17 DIAGNOSIS — J3081 Allergic rhinitis due to animal (cat) (dog) hair and dander: Secondary | ICD-10-CM | POA: Diagnosis not present

## 2016-11-24 DIAGNOSIS — J3081 Allergic rhinitis due to animal (cat) (dog) hair and dander: Secondary | ICD-10-CM | POA: Diagnosis not present

## 2016-11-24 DIAGNOSIS — J3089 Other allergic rhinitis: Secondary | ICD-10-CM | POA: Diagnosis not present

## 2016-11-24 DIAGNOSIS — J301 Allergic rhinitis due to pollen: Secondary | ICD-10-CM | POA: Diagnosis not present

## 2016-12-01 DIAGNOSIS — J3089 Other allergic rhinitis: Secondary | ICD-10-CM | POA: Diagnosis not present

## 2016-12-01 DIAGNOSIS — J3081 Allergic rhinitis due to animal (cat) (dog) hair and dander: Secondary | ICD-10-CM | POA: Diagnosis not present

## 2016-12-01 DIAGNOSIS — J301 Allergic rhinitis due to pollen: Secondary | ICD-10-CM | POA: Diagnosis not present

## 2016-12-08 DIAGNOSIS — J301 Allergic rhinitis due to pollen: Secondary | ICD-10-CM | POA: Diagnosis not present

## 2016-12-08 DIAGNOSIS — H1045 Other chronic allergic conjunctivitis: Secondary | ICD-10-CM | POA: Diagnosis not present

## 2016-12-08 DIAGNOSIS — J3089 Other allergic rhinitis: Secondary | ICD-10-CM | POA: Diagnosis not present

## 2016-12-08 DIAGNOSIS — J3081 Allergic rhinitis due to animal (cat) (dog) hair and dander: Secondary | ICD-10-CM | POA: Diagnosis not present

## 2016-12-15 DIAGNOSIS — J3081 Allergic rhinitis due to animal (cat) (dog) hair and dander: Secondary | ICD-10-CM | POA: Diagnosis not present

## 2016-12-15 DIAGNOSIS — J3089 Other allergic rhinitis: Secondary | ICD-10-CM | POA: Diagnosis not present

## 2016-12-15 DIAGNOSIS — J301 Allergic rhinitis due to pollen: Secondary | ICD-10-CM | POA: Diagnosis not present

## 2016-12-21 DIAGNOSIS — L578 Other skin changes due to chronic exposure to nonionizing radiation: Secondary | ICD-10-CM | POA: Diagnosis not present

## 2016-12-21 DIAGNOSIS — C44319 Basal cell carcinoma of skin of other parts of face: Secondary | ICD-10-CM | POA: Diagnosis not present

## 2016-12-21 DIAGNOSIS — L570 Actinic keratosis: Secondary | ICD-10-CM | POA: Diagnosis not present

## 2016-12-22 DIAGNOSIS — J3081 Allergic rhinitis due to animal (cat) (dog) hair and dander: Secondary | ICD-10-CM | POA: Diagnosis not present

## 2016-12-22 DIAGNOSIS — J301 Allergic rhinitis due to pollen: Secondary | ICD-10-CM | POA: Diagnosis not present

## 2016-12-22 DIAGNOSIS — J3089 Other allergic rhinitis: Secondary | ICD-10-CM | POA: Diagnosis not present

## 2016-12-29 DIAGNOSIS — J3089 Other allergic rhinitis: Secondary | ICD-10-CM | POA: Diagnosis not present

## 2016-12-29 DIAGNOSIS — J301 Allergic rhinitis due to pollen: Secondary | ICD-10-CM | POA: Diagnosis not present

## 2016-12-29 DIAGNOSIS — J3081 Allergic rhinitis due to animal (cat) (dog) hair and dander: Secondary | ICD-10-CM | POA: Diagnosis not present

## 2016-12-30 ENCOUNTER — Telehealth: Payer: Self-pay | Admitting: Internal Medicine

## 2016-12-30 NOTE — Telephone Encounter (Signed)
Pt called requesting info on PT.

## 2016-12-31 NOTE — Telephone Encounter (Signed)
Left message to call office. I need clarification as to what she is actually asking for.

## 2016-12-31 NOTE — Telephone Encounter (Signed)
Order written Please fax to PT at [facility]

## 2016-12-31 NOTE — Telephone Encounter (Signed)
Spoke to pt. Order faxed to [facility]

## 2016-12-31 NOTE — Telephone Encounter (Signed)
She said her right shoulder is hurting again. She is asking if Dr Silvio Pate will do PT orders at [facility] for her.

## 2017-01-05 DIAGNOSIS — M25511 Pain in right shoulder: Secondary | ICD-10-CM | POA: Diagnosis not present

## 2017-01-07 DIAGNOSIS — M25511 Pain in right shoulder: Secondary | ICD-10-CM | POA: Diagnosis not present

## 2017-01-07 DIAGNOSIS — J301 Allergic rhinitis due to pollen: Secondary | ICD-10-CM | POA: Diagnosis not present

## 2017-01-07 DIAGNOSIS — J3089 Other allergic rhinitis: Secondary | ICD-10-CM | POA: Diagnosis not present

## 2017-01-07 DIAGNOSIS — J3081 Allergic rhinitis due to animal (cat) (dog) hair and dander: Secondary | ICD-10-CM | POA: Diagnosis not present

## 2017-01-11 DIAGNOSIS — M25511 Pain in right shoulder: Secondary | ICD-10-CM | POA: Diagnosis not present

## 2017-01-12 DIAGNOSIS — J3089 Other allergic rhinitis: Secondary | ICD-10-CM | POA: Diagnosis not present

## 2017-01-12 DIAGNOSIS — J3081 Allergic rhinitis due to animal (cat) (dog) hair and dander: Secondary | ICD-10-CM | POA: Diagnosis not present

## 2017-01-12 DIAGNOSIS — J301 Allergic rhinitis due to pollen: Secondary | ICD-10-CM | POA: Diagnosis not present

## 2017-01-14 DIAGNOSIS — M25511 Pain in right shoulder: Secondary | ICD-10-CM | POA: Diagnosis not present

## 2017-01-15 DIAGNOSIS — M12811 Other specific arthropathies, not elsewhere classified, right shoulder: Secondary | ICD-10-CM | POA: Diagnosis not present

## 2017-01-19 ENCOUNTER — Telehealth: Payer: Self-pay

## 2017-01-19 DIAGNOSIS — J3081 Allergic rhinitis due to animal (cat) (dog) hair and dander: Secondary | ICD-10-CM | POA: Diagnosis not present

## 2017-01-19 DIAGNOSIS — J3089 Other allergic rhinitis: Secondary | ICD-10-CM | POA: Diagnosis not present

## 2017-01-19 DIAGNOSIS — M25511 Pain in right shoulder: Secondary | ICD-10-CM | POA: Diagnosis not present

## 2017-01-19 DIAGNOSIS — J301 Allergic rhinitis due to pollen: Secondary | ICD-10-CM | POA: Diagnosis not present

## 2017-01-19 NOTE — Telephone Encounter (Signed)
Pt called to request an order for OT for "driving a car". Please fax to [facility] did not leave a fax number.

## 2017-01-20 NOTE — Telephone Encounter (Signed)
Order written. Please fax

## 2017-01-20 NOTE — Telephone Encounter (Signed)
Order faxed to [facility] PT hoping that is where it needed to go

## 2017-01-21 DIAGNOSIS — M25511 Pain in right shoulder: Secondary | ICD-10-CM | POA: Diagnosis not present

## 2017-01-26 DIAGNOSIS — J301 Allergic rhinitis due to pollen: Secondary | ICD-10-CM | POA: Diagnosis not present

## 2017-01-26 DIAGNOSIS — J3089 Other allergic rhinitis: Secondary | ICD-10-CM | POA: Diagnosis not present

## 2017-01-26 DIAGNOSIS — J3081 Allergic rhinitis due to animal (cat) (dog) hair and dander: Secondary | ICD-10-CM | POA: Diagnosis not present

## 2017-01-26 DIAGNOSIS — M25511 Pain in right shoulder: Secondary | ICD-10-CM | POA: Diagnosis not present

## 2017-01-28 DIAGNOSIS — M25511 Pain in right shoulder: Secondary | ICD-10-CM | POA: Diagnosis not present

## 2017-01-28 DIAGNOSIS — H401131 Primary open-angle glaucoma, bilateral, mild stage: Secondary | ICD-10-CM | POA: Diagnosis not present

## 2017-02-01 DIAGNOSIS — J3081 Allergic rhinitis due to animal (cat) (dog) hair and dander: Secondary | ICD-10-CM | POA: Diagnosis not present

## 2017-02-01 DIAGNOSIS — J3089 Other allergic rhinitis: Secondary | ICD-10-CM | POA: Diagnosis not present

## 2017-02-02 DIAGNOSIS — M25511 Pain in right shoulder: Secondary | ICD-10-CM | POA: Diagnosis not present

## 2017-02-02 DIAGNOSIS — J301 Allergic rhinitis due to pollen: Secondary | ICD-10-CM | POA: Diagnosis not present

## 2017-02-02 DIAGNOSIS — J3089 Other allergic rhinitis: Secondary | ICD-10-CM | POA: Diagnosis not present

## 2017-02-02 DIAGNOSIS — R4189 Other symptoms and signs involving cognitive functions and awareness: Secondary | ICD-10-CM | POA: Diagnosis not present

## 2017-02-02 DIAGNOSIS — J3081 Allergic rhinitis due to animal (cat) (dog) hair and dander: Secondary | ICD-10-CM | POA: Diagnosis not present

## 2017-02-04 DIAGNOSIS — M25511 Pain in right shoulder: Secondary | ICD-10-CM | POA: Diagnosis not present

## 2017-02-04 DIAGNOSIS — R4189 Other symptoms and signs involving cognitive functions and awareness: Secondary | ICD-10-CM | POA: Diagnosis not present

## 2017-02-09 DIAGNOSIS — J3089 Other allergic rhinitis: Secondary | ICD-10-CM | POA: Diagnosis not present

## 2017-02-09 DIAGNOSIS — J301 Allergic rhinitis due to pollen: Secondary | ICD-10-CM | POA: Diagnosis not present

## 2017-02-09 DIAGNOSIS — R4189 Other symptoms and signs involving cognitive functions and awareness: Secondary | ICD-10-CM | POA: Diagnosis not present

## 2017-02-09 DIAGNOSIS — J3081 Allergic rhinitis due to animal (cat) (dog) hair and dander: Secondary | ICD-10-CM | POA: Diagnosis not present

## 2017-02-09 DIAGNOSIS — M25511 Pain in right shoulder: Secondary | ICD-10-CM | POA: Diagnosis not present

## 2017-02-11 DIAGNOSIS — M25511 Pain in right shoulder: Secondary | ICD-10-CM | POA: Diagnosis not present

## 2017-02-11 DIAGNOSIS — R4189 Other symptoms and signs involving cognitive functions and awareness: Secondary | ICD-10-CM | POA: Diagnosis not present

## 2017-02-16 DIAGNOSIS — J3089 Other allergic rhinitis: Secondary | ICD-10-CM | POA: Diagnosis not present

## 2017-02-16 DIAGNOSIS — R4189 Other symptoms and signs involving cognitive functions and awareness: Secondary | ICD-10-CM | POA: Diagnosis not present

## 2017-02-16 DIAGNOSIS — M25511 Pain in right shoulder: Secondary | ICD-10-CM | POA: Diagnosis not present

## 2017-02-16 DIAGNOSIS — J301 Allergic rhinitis due to pollen: Secondary | ICD-10-CM | POA: Diagnosis not present

## 2017-02-16 DIAGNOSIS — J3081 Allergic rhinitis due to animal (cat) (dog) hair and dander: Secondary | ICD-10-CM | POA: Diagnosis not present

## 2017-02-17 DIAGNOSIS — R4189 Other symptoms and signs involving cognitive functions and awareness: Secondary | ICD-10-CM | POA: Diagnosis not present

## 2017-02-17 DIAGNOSIS — M25511 Pain in right shoulder: Secondary | ICD-10-CM | POA: Diagnosis not present

## 2017-02-18 DIAGNOSIS — R4189 Other symptoms and signs involving cognitive functions and awareness: Secondary | ICD-10-CM | POA: Diagnosis not present

## 2017-02-18 DIAGNOSIS — M25511 Pain in right shoulder: Secondary | ICD-10-CM | POA: Diagnosis not present

## 2017-02-19 DIAGNOSIS — R4189 Other symptoms and signs involving cognitive functions and awareness: Secondary | ICD-10-CM | POA: Diagnosis not present

## 2017-02-19 DIAGNOSIS — M25511 Pain in right shoulder: Secondary | ICD-10-CM | POA: Diagnosis not present

## 2017-02-23 ENCOUNTER — Encounter: Payer: Medicare Other | Admitting: Internal Medicine

## 2017-02-23 DIAGNOSIS — J301 Allergic rhinitis due to pollen: Secondary | ICD-10-CM | POA: Diagnosis not present

## 2017-02-23 DIAGNOSIS — M25511 Pain in right shoulder: Secondary | ICD-10-CM | POA: Diagnosis not present

## 2017-02-23 DIAGNOSIS — J3089 Other allergic rhinitis: Secondary | ICD-10-CM | POA: Diagnosis not present

## 2017-02-23 DIAGNOSIS — J3081 Allergic rhinitis due to animal (cat) (dog) hair and dander: Secondary | ICD-10-CM | POA: Diagnosis not present

## 2017-02-23 DIAGNOSIS — R4189 Other symptoms and signs involving cognitive functions and awareness: Secondary | ICD-10-CM | POA: Diagnosis not present

## 2017-02-24 DIAGNOSIS — M25511 Pain in right shoulder: Secondary | ICD-10-CM | POA: Diagnosis not present

## 2017-02-24 DIAGNOSIS — R4189 Other symptoms and signs involving cognitive functions and awareness: Secondary | ICD-10-CM | POA: Diagnosis not present

## 2017-02-25 DIAGNOSIS — R4189 Other symptoms and signs involving cognitive functions and awareness: Secondary | ICD-10-CM | POA: Diagnosis not present

## 2017-02-25 DIAGNOSIS — M25511 Pain in right shoulder: Secondary | ICD-10-CM | POA: Diagnosis not present

## 2017-02-26 DIAGNOSIS — M25511 Pain in right shoulder: Secondary | ICD-10-CM | POA: Diagnosis not present

## 2017-02-26 DIAGNOSIS — R4189 Other symptoms and signs involving cognitive functions and awareness: Secondary | ICD-10-CM | POA: Diagnosis not present

## 2017-03-01 ENCOUNTER — Encounter: Payer: Self-pay | Admitting: Internal Medicine

## 2017-03-01 ENCOUNTER — Ambulatory Visit (INDEPENDENT_AMBULATORY_CARE_PROVIDER_SITE_OTHER): Payer: Medicare Other | Admitting: Internal Medicine

## 2017-03-01 VITALS — BP 122/78 | HR 80 | Temp 98.2°F | Ht 63.25 in | Wt 132.0 lb

## 2017-03-01 DIAGNOSIS — J301 Allergic rhinitis due to pollen: Secondary | ICD-10-CM | POA: Diagnosis not present

## 2017-03-01 DIAGNOSIS — M150 Primary generalized (osteo)arthritis: Secondary | ICD-10-CM

## 2017-03-01 DIAGNOSIS — Z7189 Other specified counseling: Secondary | ICD-10-CM

## 2017-03-01 DIAGNOSIS — M159 Polyosteoarthritis, unspecified: Secondary | ICD-10-CM

## 2017-03-01 DIAGNOSIS — Z0000 Encounter for general adult medical examination without abnormal findings: Secondary | ICD-10-CM | POA: Diagnosis not present

## 2017-03-01 DIAGNOSIS — I519 Heart disease, unspecified: Secondary | ICD-10-CM | POA: Diagnosis not present

## 2017-03-01 DIAGNOSIS — I5189 Other ill-defined heart diseases: Secondary | ICD-10-CM

## 2017-03-01 MED ORDER — METRONIDAZOLE 0.75 % VA GEL: 1.0000 | Freq: Two times a day (BID) | VAGINAL | 3 refills | Status: DC

## 2017-03-01 NOTE — Assessment & Plan Note (Signed)
I have personally reviewed the Medicare Annual Wellness questionnaire and have noted 1. The patient's medical and social history 2. Their use of alcohol, tobacco or illicit drugs 3. Their current medications and supplements 4. The patient's functional ability including ADL's, fall risks, home safety risks and hearing or visual             impairment. 5. Diet and physical activities 6. Evidence for depression or mood disorders  The patients weight, height, BMI and visual acuity have been recorded in the chart I have made referrals, counseling and provided education to the patient based review of the above and I have provided the pt with a written personalized care plan for preventive services.  I have provided you with a copy of your personalized plan for preventive services. Please take the time to review along with your updated medication list.  Yearly flu vaccine Will update pneumovax next year No cancer screening due to age Does exercise Has help in home--doesn't drive outside of [place]

## 2017-03-01 NOTE — Progress Notes (Addendum)
Subjective:    Patient ID: [name] [name], female    DOB: 13-Nov-1923, 83 y.o.   MRN: [id number]  HPI Here with husband for Medicare wellness visit and follow up of chronic health conditions Reviewed form and advanced directives Reviewed other doctors Still enjoys a nightly [place] No tobacco Exercises regularly Vision is okay--early glaucoma Has hearing aides which do help Husband needs daily aide--she will do the grocery shopping. 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, etc Cooks some--and prepared meals Had 1 fall over curb--no injury Some memory issues--nothing worrisome  Had horrendous left side pain the last night Tried tylenol and it finally went away (awoke with it gone) No vomiting or diarrhea Bowels are slow and erratic at times No blood in stool  Right shoulder continues to be a problems Gets PT twice a week OT also Gets injections every 3 months Doesn't drive outside of [facility] now---due to trouble even shifting  Vaginal discharge continues Doesn't remember taking the fluconazole last year Will try metrogel vaginal  Current Outpatient Prescriptions on File Prior to Visit  Medication Sig Dispense Refill  . acetaminophen (TYLENOL) 650 MG CR tablet Take 650 mg by mouth 3 (three) times daily.    Marland Kitchen aspirin 81 MG tablet Take 81 mg by mouth daily.    . celecoxib (CELEBREX) 200 MG capsule TAKE 1 CAPSULE DAILY 90 capsule 3  . [employer] VITAMIN D3 1000 UNITS capsule TAKE ONE CAPSULE BY MOUTH DAILY 30 capsule 0  . DiphenhydrAMINE HCl (ALLERGY MED PO) Take 1 tablet by mouth daily.    Marland Kitchen docusate sodium (STOOL SOFTENER) 100 MG capsule Take 100 mg by mouth daily as needed for mild constipation.    Marland Kitchen HYDROcodone-acetaminophen (NORCO/VICODIN) 5-325 MG tablet Take 1-2 tablets by mouth every 4 (four) hours as needed (breakthrough pain). 30 tablet 0  . hydrocortisone 2.5 % cream Apply topically 3 (three) times daily as needed. 28 g 3  . Lactobacillus (ACIDOPHILUS) 100 MG CAPS Take  by mouth.    . levocetirizine (XYZAL) 5 MG tablet Take 5 mg by mouth every evening.    . Multiple Vitamin (MULTIVITAMIN) tablet Take 1 tablet by mouth daily.    . polyethylene glycol (MIRALAX / GLYCOLAX) packet Take 17 g by mouth daily as needed.    . Probiotic Product (PROBIOTIC ADVANCED PO) Take 1 capsule by mouth daily.    . Simethicone (GAS-X PO) Take by mouth 2 (two) times daily.    Marland Kitchen tolterodine (DETROL LA) 4 MG 24 hr capsule TAKE 1 CAPSULE DAILY 90 capsule 3   No current facility-administered medications on file prior to visit.     Allergies  Allergen Reactions  . Demerol [Meperidine]     dizzy  . Shellfish Allergy     Past Medical History:  Diagnosis Date  . Allergic rhinitis due to pollen    some shellfish also  . Aortic sclerosis 5/12   On echo. no stenosis  . Arthritis   . Diastolic dysfunction    stress echo otherwise normal 1/04. Repeat 5/11 also negative  . History of seasonal allergies   . Hyperlipidemia   . Osteoarthritis, multiple sites   . Osteoporosis   . Overactive bladder   . Urge incontinence     Past Surgical History:  Procedure Laterality Date  . CYSTOCELE REPAIR  2007   and rectocele  . DILATION AND CURETTAGE OF UTERUS    . INGUINAL HERNIA REPAIR  2012   left side with mesh  . TONSILLECTOMY  AND ADENOIDECTOMY     as child  . TOTAL HIP ARTHROPLASTY Right 05/23/2015   Procedure: TOTAL HIP ARTHROPLASTY ANTERIOR APPROACH;  Surgeon: Hessie Knows, MD;  Location: [facility] ORS;  Service: Orthopedics;  Laterality: Right;  . UMBILICAL HERNIA REPAIR  02/04/06    Family History  Problem Relation Age of Onset  . Cancer Father 58       colon cancer  . Heart disease Father     Social History   Social History  . Marital status: Married    Spouse name: N/A  . Number of children: 3  . Years of education: N/A   Occupational History  . 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 Smoking status:  Former Research scientist (life sciences)  . Smokeless tobacco: Never Used  . Alcohol use Yes     Comment: wine before dinner  . Drug use: No  . Sexual activity: Not on file   Other Topics Concern  . Not on file   Social History Narrative   Has living will   Husband then daughter [name], is health care POA   Would accept trial of resuscitation   Would probably accept feeding tube   Review of Systems  Some allergy problems---will rarely take benedryl (no more than once a month) Still gets allergy shots Appetite is good Weight stable Sleeps okay---other than shoulder pain generally waking her at times Teeth okay--keeps up with dentist Wears seat belt No skin rash or ulcers No heartburn or dysphagai     Objective:   Physical Exam  Constitutional: She is oriented to person, place, and time. She appears well-nourished. No distress.  HENT:  Mouth/Throat: Oropharynx is clear and moist. No oropharyngeal exudate.  Neck: No thyromegaly present.  Cardiovascular: Normal rate, regular rhythm and intact distal pulses.  Exam reveals no gallop.   Gr 3/6 aortic systolic murmur  Pulmonary/Chest: Effort normal and breath sounds normal. No respiratory distress. She has no wheezes. She has no rales.  Abdominal: Soft. There is no tenderness.  Musculoskeletal: She exhibits no edema or tenderness.  Lymphadenopathy:    She has no cervical adenopathy.  Neurological: She is alert and oriented to person, place, and time.  President-- "Donetta Potts----- Bush" [id number] D-l-r-[id number]-w Recall 3/3  Skin: No rash noted. No erythema.  Psychiatric: She has a normal mood and affect. Her behavior is normal.          Assessment & Plan:

## 2017-03-01 NOTE — Assessment & Plan Note (Signed)
Continues with Dr Donneta Romberg

## 2017-03-01 NOTE — Assessment & Plan Note (Signed)
Only seen on echo No clinical CHF No meds

## 2017-03-01 NOTE — Assessment & Plan Note (Signed)
Right shoulder is the worst Some disability from this Continues with Dr Rudene Christians

## 2017-03-01 NOTE — Assessment & Plan Note (Signed)
See social history 

## 2017-03-02 DIAGNOSIS — J301 Allergic rhinitis due to pollen: Secondary | ICD-10-CM | POA: Diagnosis not present

## 2017-03-02 DIAGNOSIS — M25511 Pain in right shoulder: Secondary | ICD-10-CM | POA: Diagnosis not present

## 2017-03-02 DIAGNOSIS — J3089 Other allergic rhinitis: Secondary | ICD-10-CM | POA: Diagnosis not present

## 2017-03-02 DIAGNOSIS — J3081 Allergic rhinitis due to animal (cat) (dog) hair and dander: Secondary | ICD-10-CM | POA: Diagnosis not present

## 2017-03-02 DIAGNOSIS — R4189 Other symptoms and signs involving cognitive functions and awareness: Secondary | ICD-10-CM | POA: Diagnosis not present

## 2017-03-02 LAB — COMPREHENSIVE METABOLIC PANEL
ALK PHOS: 75 U/L (ref 39–117)
ALT: 13 U/L (ref 0–35)
AST: 20 U/L (ref 0–37)
Albumin: 4 g/dL (ref 3.5–5.2)
BILIRUBIN TOTAL: 0.4 mg/dL (ref 0.2–1.2)
BUN: 22 mg/dL (ref 6–23)
CO2: 28 mEq/L (ref 19–32)
Calcium: 9.7 mg/dL (ref 8.4–10.5)
Chloride: 98 mEq/L (ref 96–112)
Creatinine, Ser: 0.67 mg/dL (ref 0.40–1.20)
GFR: 87.71 mL/min (ref >60.00)
GLUCOSE: 99 mg/dL (ref 70–99)
POTASSIUM: 4.2 meq/L (ref 3.5–5.1)
SODIUM: 133 meq/L — AB (ref 135–145)
TOTAL PROTEIN: 6.8 g/dL (ref 6.0–8.3)

## 2017-03-02 LAB — CBC WITH DIFFERENTIAL/PLATELET
Basophils Absolute: 0.2 10*3/uL — ABNORMAL HIGH (ref 0.0–0.1)
Basophils Relative: 2.3 % (ref 0.0–3.0)
EOS PCT: 2.4 % (ref 0.0–5.0)
Eosinophils Absolute: 0.3 10*3/uL (ref 0.0–0.7)
HCT: 37.8 % (ref 36.0–46.0)
Hemoglobin: 12.5 g/dL (ref 12.0–15.0)
LYMPHS ABS: 5.4 10*3/uL — AB (ref 0.7–4.0)
Lymphocytes Relative: 52.3 % — ABNORMAL HIGH (ref 12.0–46.0)
MCHC: 33.1 g/dL (ref 30.0–36.0)
MCV: 96.1 fl (ref 78.0–100.0)
MONO ABS: 0.7 10*3/uL (ref 0.1–1.0)
Monocytes Relative: 7.1 % (ref 3.0–12.0)
NEUTROS PCT: 35.9 % — AB (ref 43.0–77.0)
Neutro Abs: 3.7 10*3/uL (ref 1.4–7.7)
Platelets: 296 10*3/uL (ref 150.0–400.0)
RBC: 3.93 Mil/uL (ref 3.87–5.11)
RDW: 13.6 % (ref 11.5–15.5)
WBC: 10.4 10*3/uL (ref 4.0–10.5)

## 2017-03-04 DIAGNOSIS — M25511 Pain in right shoulder: Secondary | ICD-10-CM | POA: Diagnosis not present

## 2017-03-09 DIAGNOSIS — M25511 Pain in right shoulder: Secondary | ICD-10-CM | POA: Diagnosis not present

## 2017-03-09 DIAGNOSIS — J3089 Other allergic rhinitis: Secondary | ICD-10-CM | POA: Diagnosis not present

## 2017-03-09 DIAGNOSIS — J3081 Allergic rhinitis due to animal (cat) (dog) hair and dander: Secondary | ICD-10-CM | POA: Diagnosis not present

## 2017-03-09 DIAGNOSIS — J301 Allergic rhinitis due to pollen: Secondary | ICD-10-CM | POA: Diagnosis not present

## 2017-03-11 DIAGNOSIS — M25511 Pain in right shoulder: Secondary | ICD-10-CM | POA: Diagnosis not present

## 2017-03-16 DIAGNOSIS — J3081 Allergic rhinitis due to animal (cat) (dog) hair and dander: Secondary | ICD-10-CM | POA: Diagnosis not present

## 2017-03-16 DIAGNOSIS — M25511 Pain in right shoulder: Secondary | ICD-10-CM | POA: Diagnosis not present

## 2017-03-16 DIAGNOSIS — J3089 Other allergic rhinitis: Secondary | ICD-10-CM | POA: Diagnosis not present

## 2017-03-16 DIAGNOSIS — J301 Allergic rhinitis due to pollen: Secondary | ICD-10-CM | POA: Diagnosis not present

## 2017-03-18 DIAGNOSIS — M25511 Pain in right shoulder: Secondary | ICD-10-CM | POA: Diagnosis not present

## 2017-03-18 DIAGNOSIS — J3081 Allergic rhinitis due to animal (cat) (dog) hair and dander: Secondary | ICD-10-CM | POA: Diagnosis not present

## 2017-03-18 DIAGNOSIS — J301 Allergic rhinitis due to pollen: Secondary | ICD-10-CM | POA: Diagnosis not present

## 2017-03-18 DIAGNOSIS — J3089 Other allergic rhinitis: Secondary | ICD-10-CM | POA: Diagnosis not present

## 2017-03-23 DIAGNOSIS — J3081 Allergic rhinitis due to animal (cat) (dog) hair and dander: Secondary | ICD-10-CM | POA: Diagnosis not present

## 2017-03-23 DIAGNOSIS — J3089 Other allergic rhinitis: Secondary | ICD-10-CM | POA: Diagnosis not present

## 2017-03-23 DIAGNOSIS — J301 Allergic rhinitis due to pollen: Secondary | ICD-10-CM | POA: Diagnosis not present

## 2017-03-25 DIAGNOSIS — M25511 Pain in right shoulder: Secondary | ICD-10-CM | POA: Diagnosis not present

## 2017-03-25 DIAGNOSIS — J301 Allergic rhinitis due to pollen: Secondary | ICD-10-CM | POA: Diagnosis not present

## 2017-03-25 DIAGNOSIS — J3081 Allergic rhinitis due to animal (cat) (dog) hair and dander: Secondary | ICD-10-CM | POA: Diagnosis not present

## 2017-03-25 DIAGNOSIS — J3089 Other allergic rhinitis: Secondary | ICD-10-CM | POA: Diagnosis not present

## 2017-03-26 DIAGNOSIS — M12811 Other specific arthropathies, not elsewhere classified, right shoulder: Secondary | ICD-10-CM | POA: Diagnosis not present

## 2017-03-30 DIAGNOSIS — J3089 Other allergic rhinitis: Secondary | ICD-10-CM | POA: Diagnosis not present

## 2017-03-30 DIAGNOSIS — J301 Allergic rhinitis due to pollen: Secondary | ICD-10-CM | POA: Diagnosis not present

## 2017-03-30 DIAGNOSIS — J3081 Allergic rhinitis due to animal (cat) (dog) hair and dander: Secondary | ICD-10-CM | POA: Diagnosis not present

## 2017-04-06 ENCOUNTER — Telehealth: Payer: Self-pay

## 2017-04-06 DIAGNOSIS — J3089 Other allergic rhinitis: Secondary | ICD-10-CM | POA: Diagnosis not present

## 2017-04-06 DIAGNOSIS — J301 Allergic rhinitis due to pollen: Secondary | ICD-10-CM | POA: Diagnosis not present

## 2017-04-06 DIAGNOSIS — J3081 Allergic rhinitis due to animal (cat) (dog) hair and dander: Secondary | ICD-10-CM | POA: Diagnosis not present

## 2017-04-06 NOTE — Telephone Encounter (Signed)
Pt request refill metrogel; spoke with Milus Height at [place] [address] and pt has available refill that RA will get ready for pick up. Pt voiced understanding.

## 2017-04-08 ENCOUNTER — Other Ambulatory Visit: Payer: Self-pay | Admitting: Internal Medicine

## 2017-04-13 DIAGNOSIS — J301 Allergic rhinitis due to pollen: Secondary | ICD-10-CM | POA: Diagnosis not present

## 2017-04-13 DIAGNOSIS — J3089 Other allergic rhinitis: Secondary | ICD-10-CM | POA: Diagnosis not present

## 2017-04-13 DIAGNOSIS — J3081 Allergic rhinitis due to animal (cat) (dog) hair and dander: Secondary | ICD-10-CM | POA: Diagnosis not present

## 2017-04-14 ENCOUNTER — Telehealth: Payer: Self-pay | Admitting: Internal Medicine

## 2017-04-14 NOTE — Telephone Encounter (Signed)
I spoke to patient and she scheduled appointment on 05/03/17.  If surgery is scheduled sooner that 05/03/17, she'll call back to reschedule.

## 2017-04-14 NOTE — Telephone Encounter (Signed)
Yes--set up once she has the date of surgery. If it will be soon, can set up now

## 2017-04-14 NOTE — Telephone Encounter (Signed)
Patient is going to have reverse total shoulder replacement by Dr.Poggi.  Patient said she needs a clearance for surgery.  Patient is going to see Dr.Poggi on 04/23/17 to discuss the surgery. Patient had wellness visit on 03/01/17.  Does patient need an appointment for clearance.

## 2017-04-16 ENCOUNTER — Other Ambulatory Visit: Payer: Self-pay | Admitting: Internal Medicine

## 2017-04-16 NOTE — Telephone Encounter (Signed)
Approved: #70g x 5 She really shouldn't need more if just refilled Should check with the pharmacy

## 2017-04-16 NOTE — Telephone Encounter (Signed)
Pt left /vm requesting refill metrogel to [place] [facility] [address]. Last refilled # 70 g x 3 on 03/01/17.last annual 03/01/17.Please advise.

## 2017-04-20 DIAGNOSIS — J3089 Other allergic rhinitis: Secondary | ICD-10-CM | POA: Diagnosis not present

## 2017-04-20 DIAGNOSIS — J3081 Allergic rhinitis due to animal (cat) (dog) hair and dander: Secondary | ICD-10-CM | POA: Diagnosis not present

## 2017-04-20 DIAGNOSIS — J301 Allergic rhinitis due to pollen: Secondary | ICD-10-CM | POA: Diagnosis not present

## 2017-04-22 DIAGNOSIS — J301 Allergic rhinitis due to pollen: Secondary | ICD-10-CM | POA: Diagnosis not present

## 2017-04-23 DIAGNOSIS — M7581 Other shoulder lesions, right shoulder: Secondary | ICD-10-CM | POA: Diagnosis not present

## 2017-04-23 DIAGNOSIS — M75101 Unspecified rotator cuff tear or rupture of right shoulder, not specified as traumatic: Secondary | ICD-10-CM | POA: Diagnosis not present

## 2017-04-23 DIAGNOSIS — M12811 Other specific arthropathies, not elsewhere classified, right shoulder: Secondary | ICD-10-CM | POA: Diagnosis not present

## 2017-04-26 ENCOUNTER — Other Ambulatory Visit: Payer: Self-pay | Admitting: Surgery

## 2017-04-26 ENCOUNTER — Other Ambulatory Visit: Payer: Self-pay | Admitting: Internal Medicine

## 2017-04-26 ENCOUNTER — Ambulatory Visit
Admission: RE | Admit: 2017-04-26 | Discharge: 2017-04-26 | Disposition: A | Discharge status: Home / Self Care | Payer: Medicare Other | Source: Ambulatory Visit | Attending: Surgery | Admitting: Surgery

## 2017-04-26 DIAGNOSIS — M75101 Unspecified rotator cuff tear or rupture of right shoulder, not specified as traumatic: Secondary | ICD-10-CM

## 2017-04-26 DIAGNOSIS — S46811A Strain of other muscles, fascia and tendons at shoulder and upper arm level, right arm, initial encounter: Secondary | ICD-10-CM

## 2017-04-26 DIAGNOSIS — M778 Other enthesopathies, not elsewhere classified: Secondary | ICD-10-CM

## 2017-04-26 DIAGNOSIS — M25511 Pain in right shoulder: Secondary | ICD-10-CM | POA: Diagnosis not present

## 2017-04-26 DIAGNOSIS — M7581 Other shoulder lesions, right shoulder: Principal | ICD-10-CM

## 2017-04-26 DIAGNOSIS — S46011A Strain of muscle(s) and tendon(s) of the rotator cuff of right shoulder, initial encounter: Secondary | ICD-10-CM

## 2017-04-27 ENCOUNTER — Encounter
Admission: RE | Disposition: A | Discharge status: Skilled Nursing Facility | Payer: Self-pay | Source: Ambulatory Visit | Attending: Surgery

## 2017-04-27 ENCOUNTER — Inpatient Hospital Stay
Admission: RE | Admit: 2017-04-27 | Discharge: 2017-04-28 | DRG: 483 | Disposition: A | Discharge status: Skilled Nursing Facility | Payer: Medicare Other | Source: Ambulatory Visit | Attending: Surgery | Admitting: Surgery

## 2017-04-27 ENCOUNTER — Encounter: Payer: Self-pay | Admitting: Emergency Medicine

## 2017-04-27 ENCOUNTER — Inpatient Hospital Stay: Payer: Medicare Other

## 2017-04-27 ENCOUNTER — Inpatient Hospital Stay: Payer: Medicare Other | Admitting: Anesthesiology

## 2017-04-27 DIAGNOSIS — I700 Atherosclerosis of aorta: Secondary | ICD-10-CM | POA: Diagnosis present

## 2017-04-27 DIAGNOSIS — M19011 Primary osteoarthritis, right shoulder: Secondary | ICD-10-CM | POA: Diagnosis present

## 2017-04-27 DIAGNOSIS — Z87891 Personal history of nicotine dependence: Secondary | ICD-10-CM | POA: Diagnosis not present

## 2017-04-27 DIAGNOSIS — M810 Age-related osteoporosis without current pathological fracture: Secondary | ICD-10-CM | POA: Diagnosis present

## 2017-04-27 DIAGNOSIS — Z471 Aftercare following joint replacement surgery: Secondary | ICD-10-CM | POA: Diagnosis not present

## 2017-04-27 DIAGNOSIS — Z01810 Encounter for preprocedural cardiovascular examination: Secondary | ICD-10-CM | POA: Diagnosis not present

## 2017-04-27 DIAGNOSIS — M12811 Other specific arthropathies, not elsewhere classified, right shoulder: Secondary | ICD-10-CM | POA: Diagnosis not present

## 2017-04-27 DIAGNOSIS — Z23 Encounter for immunization: Secondary | ICD-10-CM

## 2017-04-27 DIAGNOSIS — E785 Hyperlipidemia, unspecified: Secondary | ICD-10-CM | POA: Diagnosis present

## 2017-04-27 DIAGNOSIS — N3281 Overactive bladder: Secondary | ICD-10-CM | POA: Diagnosis present

## 2017-04-27 DIAGNOSIS — M25511 Pain in right shoulder: Secondary | ICD-10-CM | POA: Diagnosis present

## 2017-04-27 DIAGNOSIS — Z96611 Presence of right artificial shoulder joint: Secondary | ICD-10-CM | POA: Diagnosis not present

## 2017-04-27 DIAGNOSIS — M75101 Unspecified rotator cuff tear or rupture of right shoulder, not specified as traumatic: Secondary | ICD-10-CM | POA: Diagnosis present

## 2017-04-27 DIAGNOSIS — G8918 Other acute postprocedural pain: Secondary | ICD-10-CM | POA: Diagnosis not present

## 2017-04-27 DIAGNOSIS — M7581 Other shoulder lesions, right shoulder: Secondary | ICD-10-CM | POA: Diagnosis not present

## 2017-04-27 HISTORY — PX: REVERSE SHOULDER ARTHROPLASTY: SHX5054

## 2017-04-27 LAB — TYPE AND SCREEN
ABO/RH(D): O POS
Antibody Screen: NEGATIVE

## 2017-04-27 LAB — BASIC METABOLIC PANEL
ANION GAP: 8 (ref 5–15)
BUN: 19 mg/dL (ref 6–20)
CHLORIDE: 101 mmol/L (ref 101–111)
CO2: 27 mmol/L (ref 22–32)
Calcium: 9.3 mg/dL (ref 8.9–10.3)
Creatinine, Ser: 0.5 mg/dL (ref 0.44–1.00)
GFR calc Af Amer: >60 mL/min (ref >60)
GFR calc non Af Amer: >60 mL/min (ref >60)
GLUCOSE: 99 mg/dL (ref 65–99)
POTASSIUM: 4.4 mmol/L (ref 3.5–5.1)
Sodium: 136 mmol/L (ref 135–145)

## 2017-04-27 LAB — PROTIME-INR
INR: 0.97
Prothrombin Time: 12.8 seconds (ref 11.4–15.2)

## 2017-04-27 LAB — URINALYSIS, ROUTINE W REFLEX MICROSCOPIC
Bacteria, UA: NONE SEEN
Bilirubin Urine: NEGATIVE
Glucose, UA: NEGATIVE mg/dL
HGB URINE DIPSTICK: NEGATIVE
Ketones, ur: NEGATIVE mg/dL
NITRITE: NEGATIVE
PROTEIN: NEGATIVE mg/dL
Specific Gravity, Urine: 1.014 (ref 1.005–1.030)
pH: 7 (ref 5.0–8.0)

## 2017-04-27 LAB — SURGICAL PCR SCREEN
MRSA, PCR: NEGATIVE
Staphylococcus aureus: NEGATIVE

## 2017-04-27 LAB — CBC
HEMATOCRIT: 36.6 % (ref 35.0–47.0)
Hemoglobin: 12.8 g/dL (ref 12.0–16.0)
MCH: 32.9 pg (ref 26.0–34.0)
MCHC: 34.9 g/dL (ref 32.0–36.0)
MCV: 94.2 fL (ref 80.0–100.0)
Platelets: 258 10*3/uL (ref 150–440)
RBC: 3.89 MIL/uL (ref 3.80–5.20)
RDW: 13.3 % (ref 11.5–14.5)
WBC: 8.7 10*3/uL (ref 3.6–11.0)

## 2017-04-27 SURGERY — ARTHROPLASTY, SHOULDER, TOTAL, REVERSE
Anesthesia: Regional | Site: Shoulder | Laterality: Right | Wound class: Clean

## 2017-04-27 MED ORDER — ACETAMINOPHEN 500 MG PO TABS
1000.0000 mg | ORAL_TABLET | Freq: Four times a day (QID) | ORAL | Status: AC
  Administered 2017-04-27 – 2017-04-28 (×3): 1000 mg via ORAL
  Filled 2017-04-27 (×3): qty 2

## 2017-04-27 MED ORDER — SUGAMMADEX SODIUM 200 MG/2ML IV SOLN
INTRAVENOUS | Status: AC
  Filled 2017-04-27: qty 2

## 2017-04-27 MED ORDER — SUGAMMADEX SODIUM 200 MG/2ML IV SOLN
INTRAVENOUS | Status: DC | PRN
  Administered 2017-04-27: 100 mg via INTRAVENOUS

## 2017-04-27 MED ORDER — RISAQUAD PO CAPS
1.0000 | ORAL_CAPSULE | Freq: Every day | ORAL | Status: DC
  Administered 2017-04-27 – 2017-04-28 (×2): 1 via ORAL
  Filled 2017-04-27 (×2): qty 1

## 2017-04-27 MED ORDER — PHENYLEPHRINE HCL 10 MG/ML IJ SOLN
INTRAMUSCULAR | Status: DC | PRN
  Administered 2017-04-27: 20 ug/min via INTRAVENOUS

## 2017-04-27 MED ORDER — KCL IN DEXTROSE-NACL 20-5-0.9 MEQ/L-%-% IV SOLN
INTRAVENOUS | Status: DC
  Administered 2017-04-27: 16:00:00 via INTRAVENOUS
  Filled 2017-04-27 (×3): qty 1000

## 2017-04-27 MED ORDER — MAGNESIUM HYDROXIDE 400 MG/5ML PO SUSP: 30.0000 mL | Freq: Every day | ORAL | Status: DC | PRN

## 2017-04-27 MED ORDER — ONDANSETRON HCL 4 MG PO TABS: 4.0000 mg | ORAL_TABLET | Freq: Four times a day (QID) | ORAL | Status: DC | PRN

## 2017-04-27 MED ORDER — TRANEXAMIC ACID 1000 MG/10ML IV SOLN
INTRAVENOUS | Status: DC | PRN
  Administered 2017-04-27: 1000 mg via INTRAVENOUS

## 2017-04-27 MED ORDER — INFLUENZA VAC SPLIT HIGH-DOSE 0.5 ML IM SUSY
0.5000 mL | PREFILLED_SYRINGE | INTRAMUSCULAR | Status: AC
  Administered 2017-04-28: 0.5 mL via INTRAMUSCULAR
  Filled 2017-04-27: qty 0.5

## 2017-04-27 MED ORDER — BISACODYL 10 MG RE SUPP: 10.0000 mg | Freq: Every day | RECTAL | Status: DC | PRN

## 2017-04-27 MED ORDER — BUPIVACAINE-EPINEPHRINE (PF) 0.5% -1:200000 IJ SOLN
INTRAMUSCULAR | Status: AC
  Filled 2017-04-27: qty 30

## 2017-04-27 MED ORDER — PANTOPRAZOLE SODIUM 40 MG PO TBEC
40.0000 mg | DELAYED_RELEASE_TABLET | Freq: Every day | ORAL | Status: DC
  Administered 2017-04-27 – 2017-04-28 (×2): 40 mg via ORAL
  Filled 2017-04-27 (×2): qty 1

## 2017-04-27 MED ORDER — ACETAMINOPHEN 650 MG RE SUPP: 650.0000 mg | Freq: Four times a day (QID) | RECTAL | Status: DC | PRN

## 2017-04-27 MED ORDER — EPINEPHRINE PF 1 MG/ML IJ SOLN
INTRAMUSCULAR | Status: AC
  Filled 2017-04-27: qty 1

## 2017-04-27 MED ORDER — ROCURONIUM BROMIDE 100 MG/10ML IV SOLN
INTRAVENOUS | Status: DC | PRN
  Administered 2017-04-27: 40 mg via INTRAVENOUS

## 2017-04-27 MED ORDER — ROCURONIUM BROMIDE 50 MG/5ML IV SOLN
INTRAVENOUS | Status: AC
  Filled 2017-04-27: qty 1

## 2017-04-27 MED ORDER — MIDAZOLAM HCL 2 MG/2ML IJ SOLN
0.5000 mg | Freq: Once | INTRAMUSCULAR | Status: AC
  Administered 2017-04-27: 0.5 mg via INTRAVENOUS

## 2017-04-27 MED ORDER — BUPIVACAINE LIPOSOME 1.3 % IJ SUSP
INTRAMUSCULAR | Status: AC
  Filled 2017-04-27: qty 20

## 2017-04-27 MED ORDER — POLYETHYLENE GLYCOL 3350 17 G PO PACK: 17.0000 g | PACK | Freq: Every day | ORAL | Status: DC | PRN

## 2017-04-27 MED ORDER — METOCLOPRAMIDE HCL 5 MG/ML IJ SOLN: 5.0000 mg | Freq: Three times a day (TID) | INTRAMUSCULAR | Status: DC | PRN

## 2017-04-27 MED ORDER — ACETAMINOPHEN 325 MG PO TABS
650.0000 mg | ORAL_TABLET | Freq: Four times a day (QID) | ORAL | Status: DC | PRN
  Administered 2017-04-28: 650 mg via ORAL
  Filled 2017-04-27: qty 2

## 2017-04-27 MED ORDER — FENTANYL CITRATE (PF) 100 MCG/2ML IJ SOLN
50.0000 ug | Freq: Once | INTRAMUSCULAR | Status: AC
  Administered 2017-04-27: 50 ug via INTRAVENOUS

## 2017-04-27 MED ORDER — ROPIVACAINE HCL 5 MG/ML IJ SOLN
INTRAMUSCULAR | Status: DC | PRN
  Administered 2017-04-27: 30 mL via PERINEURAL

## 2017-04-27 MED ORDER — LACTATED RINGERS IV SOLN
INTRAVENOUS | Status: DC
  Administered 2017-04-27: 09:00:00 via INTRAVENOUS

## 2017-04-27 MED ORDER — FENTANYL CITRATE (PF) 100 MCG/2ML IJ SOLN
INTRAMUSCULAR | Status: AC
  Administered 2017-04-27: 50 ug via INTRAVENOUS
  Filled 2017-04-27: qty 2

## 2017-04-27 MED ORDER — CEFAZOLIN SODIUM-DEXTROSE 2-4 GM/100ML-% IV SOLN
2.0000 g | Freq: Four times a day (QID) | INTRAVENOUS | Status: DC
  Filled 2017-04-27 (×2): qty 100

## 2017-04-27 MED ORDER — LIDOCAINE HCL (PF) 2 % IJ SOLN
INTRAMUSCULAR | Status: AC
  Filled 2017-04-27: qty 2

## 2017-04-27 MED ORDER — FAMOTIDINE 20 MG PO TABS
20.0000 mg | ORAL_TABLET | Freq: Once | ORAL | Status: AC
Start: 2017-04-27 — End: 2017-04-27
  Administered 2017-04-27: 20 mg via ORAL

## 2017-04-27 MED ORDER — ONDANSETRON HCL 4 MG/2ML IJ SOLN
INTRAMUSCULAR | Status: AC
  Filled 2017-04-27: qty 2

## 2017-04-27 MED ORDER — LIDOCAINE HCL (PF) 1 % IJ SOLN
INTRAMUSCULAR | Status: AC
  Filled 2017-04-27: qty 5

## 2017-04-27 MED ORDER — BUPIVACAINE-EPINEPHRINE (PF) 0.5% -1:200000 IJ SOLN
INTRAMUSCULAR | Status: DC | PRN
  Administered 2017-04-27: 30 mL via PERINEURAL

## 2017-04-27 MED ORDER — EPHEDRINE SULFATE 50 MG/ML IJ SOLN
INTRAMUSCULAR | Status: DC | PRN
  Administered 2017-04-27: 5 mg via INTRAVENOUS

## 2017-04-27 MED ORDER — TRANEXAMIC ACID 1000 MG/10ML IV SOLN
INTRAVENOUS | Status: AC
  Filled 2017-04-27: qty 10

## 2017-04-27 MED ORDER — CEFAZOLIN SODIUM-DEXTROSE 2-4 GM/100ML-% IV SOLN
2.0000 g | Freq: Once | INTRAVENOUS | Status: AC
  Administered 2017-04-27: 2 g via INTRAVENOUS

## 2017-04-27 MED ORDER — MORPHINE SULFATE (PF) 2 MG/ML IV SOLN
1.0000 mg | INTRAVENOUS | Status: DC | PRN
  Administered 2017-04-28: 1 mg via INTRAVENOUS
  Filled 2017-04-27: qty 1

## 2017-04-27 MED ORDER — SODIUM CHLORIDE 0.9 % IJ SOLN
INTRAMUSCULAR | Status: AC
  Filled 2017-04-27: qty 50

## 2017-04-27 MED ORDER — LIDOCAINE HCL (CARDIAC) 20 MG/ML IV SOLN
INTRAVENOUS | Status: DC | PRN
  Administered 2017-04-27: 60 mg via INTRAVENOUS

## 2017-04-27 MED ORDER — PROPOFOL 10 MG/ML IV BOLUS
INTRAVENOUS | Status: AC
  Filled 2017-04-27: qty 20

## 2017-04-27 MED ORDER — NEOMYCIN-POLYMYXIN B GU 40-200000 IR SOLN
Status: DC | PRN
  Administered 2017-04-27: 16 mL

## 2017-04-27 MED ORDER — LORATADINE 10 MG PO TABS
10.0000 mg | ORAL_TABLET | Freq: Every day | ORAL | Status: DC
  Administered 2017-04-27 – 2017-04-28 (×2): 10 mg via ORAL
  Filled 2017-04-27 (×2): qty 1

## 2017-04-27 MED ORDER — METOCLOPRAMIDE HCL 10 MG PO TABS: 5.0000 mg | ORAL_TABLET | Freq: Three times a day (TID) | ORAL | Status: DC | PRN

## 2017-04-27 MED ORDER — FAMOTIDINE 20 MG PO TABS
ORAL_TABLET | ORAL | Status: AC
  Administered 2017-04-27: 20 mg via ORAL
  Filled 2017-04-27: qty 1

## 2017-04-27 MED ORDER — LEVOCETIRIZINE DIHYDROCHLORIDE 5 MG PO TABS: 5.0000 mg | ORAL_TABLET | Freq: Every evening | ORAL | Status: DC

## 2017-04-27 MED ORDER — ONDANSETRON HCL 4 MG/2ML IJ SOLN
INTRAMUSCULAR | Status: DC | PRN
  Administered 2017-04-27: 4 mg via INTRAVENOUS

## 2017-04-27 MED ORDER — FENTANYL CITRATE (PF) 100 MCG/2ML IJ SOLN: 25.0000 ug | INTRAMUSCULAR | Status: DC | PRN

## 2017-04-27 MED ORDER — LIDOCAINE HCL (PF) 2 % IJ SOLN
INTRAMUSCULAR | Status: AC
  Filled 2017-04-27: qty 4

## 2017-04-27 MED ORDER — SODIUM CHLORIDE 0.9 % IJ SOLN
INTRAMUSCULAR | Status: DC | PRN
  Administered 2017-04-27: 40 mL via INTRAVENOUS

## 2017-04-27 MED ORDER — ROPIVACAINE HCL 5 MG/ML IJ SOLN
INTRAMUSCULAR | Status: AC
  Filled 2017-04-27: qty 30

## 2017-04-27 MED ORDER — METRONIDAZOLE 0.75 % VA GEL
1.0000 | Freq: Every day | VAGINAL | Status: DC
  Filled 2017-04-27: qty 70

## 2017-04-27 MED ORDER — ONDANSETRON HCL 4 MG/2ML IJ SOLN: 4.0000 mg | Freq: Once | INTRAMUSCULAR | Status: DC | PRN

## 2017-04-27 MED ORDER — DEXTROSE 5 % IV SOLN
Freq: Four times a day (QID) | INTRAVENOUS | Status: AC
  Administered 2017-04-27 – 2017-04-28 (×3): via INTRAVENOUS
  Filled 2017-04-27 (×3): qty 20

## 2017-04-27 MED ORDER — LIDOCAINE HCL (PF) 1 % IJ SOLN
INTRAMUSCULAR | Status: DC | PRN
  Administered 2017-04-27: .8 mL via SUBCUTANEOUS

## 2017-04-27 MED ORDER — DEXAMETHASONE SODIUM PHOSPHATE 10 MG/ML IJ SOLN
INTRAMUSCULAR | Status: AC
  Filled 2017-04-27: qty 1

## 2017-04-27 MED ORDER — SIMETHICONE 80 MG PO CHEW
80.0000 mg | CHEWABLE_TABLET | Freq: Two times a day (BID) | ORAL | Status: DC
  Administered 2017-04-27 – 2017-04-28 (×3): 80 mg via ORAL
  Filled 2017-04-27 (×4): qty 1

## 2017-04-27 MED ORDER — FESOTERODINE FUMARATE ER 4 MG PO TB24
4.0000 mg | ORAL_TABLET | Freq: Every day | ORAL | Status: DC
  Administered 2017-04-27 – 2017-04-28 (×2): 4 mg via ORAL
  Filled 2017-04-27 (×2): qty 1

## 2017-04-27 MED ORDER — VITAMIN D 1000 UNITS PO TABS
1000.0000 [IU] | ORAL_TABLET | Freq: Every day | ORAL | Status: DC
  Administered 2017-04-27 – 2017-04-28 (×2): 1000 [IU] via ORAL
  Filled 2017-04-27 (×2): qty 1

## 2017-04-27 MED ORDER — DEXAMETHASONE SODIUM PHOSPHATE 10 MG/ML IJ SOLN
INTRAMUSCULAR | Status: DC | PRN
  Administered 2017-04-27: 6 mg via INTRAVENOUS

## 2017-04-27 MED ORDER — ASPIRIN EC 81 MG PO TBEC
81.0000 mg | DELAYED_RELEASE_TABLET | Freq: Every day | ORAL | Status: DC
  Administered 2017-04-27 – 2017-04-28 (×2): 81 mg via ORAL
  Filled 2017-04-27 (×2): qty 1

## 2017-04-27 MED ORDER — ONDANSETRON HCL 4 MG/2ML IJ SOLN: 4.0000 mg | Freq: Four times a day (QID) | INTRAMUSCULAR | Status: DC | PRN

## 2017-04-27 MED ORDER — PROPOFOL 10 MG/ML IV BOLUS
INTRAVENOUS | Status: DC | PRN
  Administered 2017-04-27: 120 mg via INTRAVENOUS

## 2017-04-27 MED ORDER — DOCUSATE SODIUM 100 MG PO CAPS
100.0000 mg | ORAL_CAPSULE | Freq: Two times a day (BID) | ORAL | Status: DC
  Administered 2017-04-27 – 2017-04-28 (×2): 100 mg via ORAL
  Filled 2017-04-27 (×2): qty 1

## 2017-04-27 MED ORDER — ADULT MULTIVITAMIN W/MINERALS CH
1.0000 | ORAL_TABLET | Freq: Every day | ORAL | Status: DC
  Administered 2017-04-27 – 2017-04-28 (×2): 1 via ORAL
  Filled 2017-04-27 (×2): qty 1

## 2017-04-27 MED ORDER — MIDAZOLAM HCL 2 MG/2ML IJ SOLN
INTRAMUSCULAR | Status: AC
  Administered 2017-04-27: 0.5 mg via INTRAVENOUS
  Filled 2017-04-27: qty 2

## 2017-04-27 MED ORDER — ENOXAPARIN SODIUM 40 MG/0.4ML ~~LOC~~ SOLN
40.0000 mg | SUBCUTANEOUS | Status: DC
  Administered 2017-04-28: 40 mg via SUBCUTANEOUS
  Filled 2017-04-27: qty 0.4

## 2017-04-27 MED ORDER — SODIUM CHLORIDE 0.9 % IV SOLN
INTRAVENOUS | Status: DC | PRN
  Administered 2017-04-27: 60 mL

## 2017-04-27 MED ORDER — DIPHENHYDRAMINE HCL 12.5 MG/5ML PO ELIX: 12.5000 mg | ORAL_SOLUTION | ORAL | Status: DC | PRN

## 2017-04-27 MED ORDER — FLEET ENEMA 7-19 GM/118ML RE ENEM: 1.0000 | ENEMA | Freq: Once | RECTAL | Status: DC | PRN

## 2017-04-27 MED ORDER — CEFAZOLIN SODIUM-DEXTROSE 2-4 GM/100ML-% IV SOLN
INTRAVENOUS | Status: AC
  Filled 2017-04-27: qty 100

## 2017-04-27 MED ORDER — OXYCODONE HCL 5 MG PO TABS
5.0000 mg | ORAL_TABLET | ORAL | Status: DC | PRN
  Administered 2017-04-27: 5 mg via ORAL
  Administered 2017-04-28: 10 mg via ORAL
  Administered 2017-04-28: 5 mg via ORAL
  Administered 2017-04-28: 10 mg via ORAL
  Filled 2017-04-27 (×3): qty 1
  Filled 2017-04-27 (×2): qty 2

## 2017-04-27 MED ORDER — NEOMYCIN-POLYMYXIN B GU 40-200000 IR SOLN
Status: AC
  Filled 2017-04-27: qty 20

## 2017-04-27 SURGICAL SUPPLY — 63 items
BEARING HUIMERAL E1 44X36 STD (Miscellaneous) IMPLANT
BIT DRILL TWIST 2.7 (BIT) ×2 IMPLANT
BIT DRILL TWIST 2.7MM (BIT) ×1
BLADE SAGITTAL WIDE XTHICK NO (BLADE) ×3 IMPLANT
CANISTER SUCT 1200ML W/VALVE (MISCELLANEOUS) ×3 IMPLANT
CANISTER SUCT 3000ML PPV (MISCELLANEOUS) ×6 IMPLANT
CAPT SHLDR REVTOTAL 2 ×3 IMPLANT
CATH TRAY METER 16FR LF (MISCELLANEOUS) ×3 IMPLANT
CHLORAPREP W/TINT 26ML (MISCELLANEOUS) ×3 IMPLANT
COOLER POLAR GLACIER W/PUMP (MISCELLANEOUS) ×3 IMPLANT
CRADLE LAMINECT ARM (MISCELLANEOUS) ×3 IMPLANT
DRAPE IMP U-DRAPE 54X76 (DRAPES) ×9 IMPLANT
DRAPE INCISE IOBAN 66X45 STRL (DRAPES) ×6 IMPLANT
DRAPE INCISE IOBAN 66X60 STRL (DRAPES) ×3 IMPLANT
DRAPE SHEET LG 3/4 BI-LAMINATE (DRAPES) ×6 IMPLANT
DRAPE TABLE BACK 80X90 (DRAPES) ×3 IMPLANT
DRSG OPSITE POSTOP 4X8 (GAUZE/BANDAGES/DRESSINGS) ×3 IMPLANT
ELECT CAUTERY BLADE 6.4 (BLADE) ×3 IMPLANT
GLOVE BIO SURGEON STRL SZ7.5 (GLOVE) ×12 IMPLANT
GLOVE BIO SURGEON STRL SZ8 (GLOVE) ×12 IMPLANT
GLOVE BIOGEL PI IND STRL 8 (GLOVE) ×1 IMPLANT
GLOVE BIOGEL PI INDICATOR 8 (GLOVE) ×2
GLOVE INDICATOR 8.0 STRL GRN (GLOVE) ×3 IMPLANT
GOWN STRL REUS W/ TWL LRG LVL3 (GOWN DISPOSABLE) ×1 IMPLANT
GOWN STRL REUS W/ TWL XL LVL3 (GOWN DISPOSABLE) ×1 IMPLANT
GOWN STRL REUS W/TWL LRG LVL3 (GOWN DISPOSABLE) ×2
GOWN STRL REUS W/TWL XL LVL3 (GOWN DISPOSABLE) ×2
HOOD PEEL AWAY FLYTE STAYCOOL (MISCELLANEOUS) ×9 IMPLANT
HUMERAL BEARING E1 44X36 STD (Miscellaneous) IMPLANT
KIT RM TURNOVER STRD PROC AR (KITS) ×3 IMPLANT
KIT STABILIZATION SHOULDER (MISCELLANEOUS) ×3 IMPLANT
MASK FACE SPIDER DISP (MASK) ×3 IMPLANT
NDL MAYO CATGUT SZ1 (NEEDLE)
NDL MAYO CATGUT SZ5 (NEEDLE)
NDL SAFETY 22GX1.5 (NEEDLE) ×3 IMPLANT
NDL SUT 5 .5 CRC TPR PNT MAYO (NEEDLE) IMPLANT
NEEDLE 18GX1X1/2 (RX/OR ONLY) (NEEDLE) ×3 IMPLANT
NEEDLE HYPO 25X1 1.5 SAFETY (NEEDLE) ×3 IMPLANT
NEEDLE MAYO CATGUT SZ1 (NEEDLE) IMPLANT
NEEDLE MAYO CATGUT SZ4 (NEEDLE) IMPLANT
NEEDLE SPNL 20GX3.5 QUINCKE YW (NEEDLE) ×3 IMPLANT
NS IRRIG 500ML POUR BTL (IV SOLUTION) ×3 IMPLANT
PACK ARTHROSCOPY SHOULDER (MISCELLANEOUS) ×3 IMPLANT
PAD WRAPON POLAR SHDR UNIV (MISCELLANEOUS) ×1 IMPLANT
PIN THREADED REVERSE (PIN) ×3 IMPLANT
PULSAVAC PLUS IRRIG FAN TIP (DISPOSABLE) ×3
SLING ULTRA II M (MISCELLANEOUS) ×3 IMPLANT
SOL .9 NS 3000ML IRR  AL (IV SOLUTION) ×2
SOL .9 NS 3000ML IRR UROMATIC (IV SOLUTION) ×1 IMPLANT
SPONGE LAP 18X18 5 PK (GAUZE/BANDAGES/DRESSINGS) ×3 IMPLANT
STAPLER SKIN PROX 35W (STAPLE) ×3 IMPLANT
SUT ETHIBOND 0 MO6 C/R (SUTURE) ×3 IMPLANT
SUT FIBERWIRE #2 38 BLUE 1/2 (SUTURE) ×12
SUT VIC AB 0 CT1 36 (SUTURE) ×6 IMPLANT
SUT VIC AB 2-0 CT1 27 (SUTURE) ×4
SUT VIC AB 2-0 CT1 TAPERPNT 27 (SUTURE) ×2 IMPLANT
SUTURE FIBERWR #2 38 BLUE 1/2 (SUTURE) ×4 IMPLANT
SYR 30ML LL (SYRINGE) ×6 IMPLANT
SYRINGE 10CC LL (SYRINGE) ×3 IMPLANT
TIP FAN IRRIG PULSAVAC PLUS (DISPOSABLE) ×1 IMPLANT
TUBING CONNECTING 10 (TUBING) ×2 IMPLANT
TUBING CONNECTING 10' (TUBING) ×1
WRAPON POLAR PAD SHDR UNIV (MISCELLANEOUS) ×3

## 2017-04-27 NOTE — OR Nursing (Signed)
Polar ice pack in place

## 2017-04-27 NOTE — OR Nursing (Signed)
Patient resting block in place tolerated well patient is comfortable

## 2017-04-27 NOTE — NC FL2 (Signed)
[place] LEVEL OF CARE SCREENING TOOL     IDENTIFICATION  Patient Name: [name] [name] Birthdate: 09-Nov-1923 Sex: female Admission Date (Current Location): 04/27/2017  [place] and [place] Number:  Engineering geologist and Address:  [facility], [address] [place], [place] [address]      Provider Number: [id number]  Attending Physician Name and Address:  Corky Mull, MD  Relative Name and Phone Number:       Current Level of Care: Hospital Recommended Level of Care: [place] Prior Approval Number:    Date Approved/Denied:   PASRR Number:   [id number]  Discharge Plan: SNF    Current Diagnoses: Patient Active Problem List   Diagnosis Date Noted  . Status post reverse total shoulder replacement, right 04/27/2017  . Primary osteoarthritis of right hip 05/23/2015  . Advanced directives, counseling/discussion 02/07/2014  . Routine general medical examination at a health care facility 02/07/2014  . Allergic rhinitis due to pollen   . Osteoporosis   . Osteoarthritis, multiple sites   . Urge incontinence   . Hyperlipidemia   . Diastolic dysfunction     Orientation RESPIRATION BLADDER Height & Weight     Self, Time, Situation, Place  O2 (Nasal Cannula) Continent Weight: 132 lb (59.9 kg) Height:  5\' 5"  (165.1 cm)  BEHAVIORAL SYMPTOMS/MOOD NEUROLOGICAL BOWEL NUTRITION STATUS      Continent Diet (To be advanced)  AMBULATORY STATUS COMMUNICATION OF NEEDS Skin   Extensive Assist Verbally Surgical wounds (Right Shoulder; Closed)                       Personal Care Assistance Level of Assistance  Bathing, Feeding, Dressing Bathing Assistance: Limited assistance Feeding assistance: Independent Dressing Assistance: Limited assistance     Functional Limitations Info  Sight, Hearing, Speech Sight Info: Adequate Hearing Info: Adequate Speech Info: Adequate    SPECIAL CARE FACTORS FREQUENCY  PT (By  licensed PT)     PT Frequency:  (5) OT Frequency:  (5)            Contractures      Additional Factors Info  Code Status, Allergies Code Status Info:  (Full Code) Allergies Info:  (DEMEROL MEPERIDINE, SHELLFISH ALLERGY )           Current Medications (04/27/2017):  This is the current hospital active medication list Current Facility-Administered Medications  Medication Dose Route Frequency Provider Last Rate Last Dose  . acetaminophen (TYLENOL) tablet 650 mg  650 mg Oral Q6H PRN Poggi, Marshall Cork, MD       Or  . acetaminophen (TYLENOL) suppository 650 mg  650 mg Rectal Q6H PRN Poggi, Marshall Cork, MD      . acetaminophen (TYLENOL) tablet 1,000 mg  1,000 mg Oral Q6H Poggi, Marshall Cork, MD   1,000 mg at 04/27/17 1505  . acidophilus (RISAQUAD) capsule 1 capsule  1 capsule Oral Daily Poggi, Marshall Cork, MD   1 capsule at 04/27/17 1553  . aspirin EC tablet 81 mg  81 mg Oral Daily Poggi, Marshall Cork, MD   81 mg at 04/27/17 1554  . bisacodyl (DULCOLAX) suppository 10 mg  10 mg Rectal Daily PRN Poggi, Marshall Cork, MD      . ceFAZolin (ANCEF) 2 g in dextrose 5 % 100 mL injection   Intravenous Q6H Poggi, Marshall Cork, MD      . cholecalciferol (VITAMIN D) tablet 1,000 Units  1,000 Units Oral Daily Poggi, Jenny Reichmann  J, MD   1,000 Units at 04/27/17 1553  . dextrose 5 % and 0.9 % NaCl with KCl 20 mEq/L infusion   Intravenous Continuous Poggi, Marshall Cork, MD 75 mL/hr at 04/27/17 1555    . diphenhydrAMINE (BENADRYL) 12.5 MG/5ML elixir 12.5-25 mg  12.5-25 mg Oral Q4H PRN Poggi, Marshall Cork, MD      . docusate sodium (COLACE) capsule 100 mg  100 mg Oral BID Poggi, Marshall Cork, MD      . Derrill Memo ON 04/28/2017] enoxaparin (LOVENOX) injection 40 mg  40 mg Subcutaneous Q24H Poggi, Marshall Cork, MD      . EPINEPHrine (ADRENALIN) 1 MG/ML           . fesoterodine (TOVIAZ) tablet 4 mg  4 mg Oral Daily Poggi, Marshall Cork, MD   4 mg at 04/27/17 1554  . [START ON 04/28/2017] Influenza vac split quadrivalent PF (FLUZONE HIGH-DOSE) injection 0.5 mL  0.5 mL Intramuscular  Tomorrow-1000 Poggi, Marshall Cork, MD      . loratadine (CLARITIN) tablet 10 mg  10 mg Oral Daily Poggi, Marshall Cork, MD   10 mg at 04/27/17 1554  . magnesium hydroxide (MILK OF MAGNESIA) suspension 30 mL  30 mL Oral Daily PRN Poggi, Marshall Cork, MD      . metoCLOPramide (REGLAN) tablet 5-10 mg  5-10 mg Oral Q8H PRN Poggi, Marshall Cork, MD       Or  . metoCLOPramide (REGLAN) injection 5-10 mg  5-10 mg Intravenous Q8H PRN Poggi, Marshall Cork, MD      . metroNIDAZOLE (METROGEL) 0.75 % vaginal gel 1 Applicatorful  1 Applicatorful Vaginal QHS Poggi, Marshall Cork, MD      . morphine 2 MG/ML injection 1-2 mg  1-2 mg Intravenous Q2H PRN Poggi, Marshall Cork, MD      . multivitamin with minerals tablet 1 tablet  1 tablet Oral Daily Poggi, Marshall Cork, MD   1 tablet at 04/27/17 1553  . ondansetron (ZOFRAN) tablet 4 mg  4 mg Oral Q6H PRN Poggi, Marshall Cork, MD       Or  . ondansetron (ZOFRAN) injection 4 mg  4 mg Intravenous Q6H PRN Poggi, Marshall Cork, MD      . oxyCODONE (Oxy IR/ROXICODONE) immediate release tablet 5-10 mg  5-10 mg Oral Q3H PRN Poggi, Marshall Cork, MD   5 mg at 04/27/17 1505  . pantoprazole (PROTONIX) EC tablet 40 mg  40 mg Oral Daily Poggi, Marshall Cork, MD   40 mg at 04/27/17 1554  . polyethylene glycol (MIRALAX / GLYCOLAX) packet 17 g  17 g Oral Daily PRN Poggi, Marshall Cork, MD      . simethicone [facility]) chewable tablet 80 mg  80 mg Oral BID Corky Mull, MD   80 mg at 04/27/17 1553  . sodium phosphate (FLEET) 7-19 GM/118ML enema 1 enema  1 enema Rectal Once PRN Poggi, Marshall Cork, MD         Discharge Medications: Please see discharge summary for a list of discharge medications.  Relevant Imaging Results:  Relevant Lab Results:   Additional Information  (SSN: [id number])  Smith Mince, Student-Social Work

## 2017-04-27 NOTE — Evaluation (Signed)
Physical Therapy Evaluation Patient Details Name: [name] [name] MRN: [id number] DOB: 09/14/1923 Today's Date: 04/27/2017   History of Present Illness  83 y/o female s/p R reverse total shoulder 9/25.    Clinical Impression  Pt initially unsure about getting up, but actually did quite well with POD0 PT exam.  She was able to get LEs to EOB and do some scooting (though she did need assist to lift torso).  She typically does not need an AD, but was guarded and cautious with ambulation and did use PT's hand for assist during the first 25 ft of ambulation.  Overall pt showed good effort and should do well, but is not at all close to her mobility baseline.  Plan for STR at [facility].     Follow Up Recommendations DC plan and follow up therapy as arranged by surgeon    Equipment Recommendations   (discussed possibly getting a cane)    Recommendations for Other Services       Precautions / Restrictions Precautions Precautions: Shoulder Type of Shoulder Precautions: rev total shld Shoulder Interventions: Shoulder sling/immobilizer Restrictions Weight Bearing Restrictions: Yes RUE Weight Bearing: Non weight bearing      Mobility  Bed Mobility Overal bed mobility: Needs Assistance Bed Mobility: Supine to Sit     Supine to sit: Min assist     General bed mobility comments: Pt able to scoot LEs and hips in bed, needed light HHA to get to sitting (attempted and unable to do so w/o L UE assist)  Transfers Overall transfer level: Modified independent Equipment used: 1 person hand held assist             General transfer comment: Pt able to rise with only light assist from [facility], she was not as confident with   Ambulation/Gait Ambulation/Gait assistance: Min guard Ambulation Distance (Feet): 50 Feet Assistive device: 1 person hand held assist;None       General Gait Details: Pt with slow, cautious, shuffling gait (that is distinctly different from her baseline).  She did  not have any LOBs but was hesitant and needed PT's hand for assist the first half of the effort.   Stairs            Wheelchair Mobility    Modified Rankin (Stroke Patients Only)       Balance Overall balance assessment: Modified Independent                                           Pertinent Vitals/Pain Pain Assessment: 0-10 Pain Score: 5  Pain Location: 5/10 back pain, no shoulder pain     Home Living Family/patient expects to be discharged to:: Skilled nursing facility Living Arrangements: Spouse/significant other               Additional Comments: Pt from [facility], plans to do rehab there    Prior Function Level of Independence: Independent         Comments: Pt cares for her husband, is generally able to stay active, minimal driving, does not need AD     Hand Dominance        Extremity/Trunk Assessment   Upper Extremity Assessment Upper Extremity Assessment: Generalized weakness (R sling not tested, L age appropriate limitations WFL)    Lower Extremity Assessment Lower Extremity Assessment: Generalized weakness (grossly 4-/5 t/o)       Communication  Communication: No difficulties  Cognition Arousal/Alertness: Awake/alert Behavior During Therapy: WFL for tasks assessed/performed Overall Cognitive Status: Within Functional Limits for tasks assessed                                        General Comments      Exercises     Assessment/Plan    PT Assessment Patient needs continued PT services  PT Problem List Decreased range of motion;Decreased strength;Decreased activity tolerance;Decreased balance;Decreased mobility;Decreased knowledge of use of DME;Decreased safety awareness;Pain;Decreased knowledge of precautions;Impaired sensation;Decreased coordination       PT Treatment Interventions DME instruction;Gait training;Stair training;Functional mobility training;Therapeutic exercise;Therapeutic  activities;Balance training;Patient/family education    PT Goals (Current goals can be found in the Care Plan section)  Acute Rehab PT Goals Patient Stated Goal: rehab at [facility] PT Goal Formulation: With patient Time For Goal Achievement: 05/11/17 Potential to Achieve Goals: Fair    Frequency BID   Barriers to discharge        Co-evaluation               AM-PAC PT "6 Clicks" Daily Activity  Outcome Measure Difficulty turning over in bed (including adjusting bedclothes, sheets and blankets)?: A Little Difficulty moving from lying on back to sitting on the side of the bed? : Unable Difficulty sitting down on and standing up from a chair with arms (e.g., wheelchair, bedside commode, etc,.)?: A Little Help needed moving to and from a bed to chair (including a wheelchair)?: A Little Help needed walking in hospital room?: A Little Help needed climbing 3-5 steps with a railing? : A Lot 6 Click Score: 15    End of Session   Activity Tolerance: Patient tolerated treatment well Patient left: with call bell/phone within reach;with family/visitor present;with chair alarm set;with nursing/sitter in room Nurse Communication: Mobility status PT Visit Diagnosis: Difficulty in walking, not elsewhere classified (R26.2);Unsteadiness on feet (R26.81);Muscle weakness (generalized) (M62.81)    Time: 4403-4742 PT Time Calculation (min) (ACUTE ONLY): 30 min   Charges:   PT Evaluation $PT Eval Low Complexity: 1 Low     PT G Codes:   PT G-Codes **NOT FOR INPATIENT CLASS** Functional Assessment Tool Used: AM-PAC 6 Clicks Basic Mobility Functional Limitation: Mobility: Walking and moving around Mobility: Walking and Moving Around Current Status (V9563): At least 40 percent but less than 60 percent impaired, limited or restricted Mobility: Walking and Moving Around Goal Status [phone]): At least 1 percent but less than 20 percent impaired, limited or restricted    Kreg Shropshire,  DPT 04/27/2017, 4:33 PM

## 2017-04-27 NOTE — Anesthesia Postprocedure Evaluation (Signed)
Anesthesia Post Note  Patient: [name] [name]  Procedure(s) Performed: Procedure(s) (LRB): REVERSE SHOULDER ARTHROPLASTY (Right)  Patient location during evaluation: PACU Anesthesia Type: Regional Level of consciousness: awake and alert Pain management: pain level controlled Vital Signs Assessment: post-procedure vital signs reviewed and stable Respiratory status: spontaneous breathing and respiratory function stable Cardiovascular status: stable Anesthetic complications: no     Last Vitals:  Vitals:   04/27/17 1358 04/27/17 1427  BP: (!) 150/70 (!) 143/67  Pulse: 72 76  Resp: 17   Temp: (!) 36.3 C   SpO2: 98% 94%    Last Pain:  Vitals:   04/27/17 1354  TempSrc:   PainSc: 0-No pain                 KEPHART,WILLIAM K

## 2017-04-27 NOTE — Anesthesia Preprocedure Evaluation (Signed)
Anesthesia Evaluation  Patient identified by MRN, date of birth, ID band Patient awake    Reviewed: Allergy & Precautions, NPO status , Patient's Chart, lab work & pertinent test results  History of Anesthesia Complications Negative for: history of anesthetic complications  Airway Mallampati: II       Dental   Pulmonary neg sleep apnea, neg COPD, former smoker,           Cardiovascular (-) hypertension(-) Past MI and (-) CHF (-) dysrhythmias (-) Valvular Problems/Murmurs     Neuro/Psych neg Seizures    GI/Hepatic Neg liver ROS, neg GERD  ,  Endo/Other  neg diabetes  Renal/GU negative Renal ROS     Musculoskeletal   Abdominal   Peds  Hematology   Anesthesia Other Findings   Reproductive/Obstetrics                             Anesthesia Physical Anesthesia Plan  ASA: II  Anesthesia Plan: General   Post-op Pain Management: GA combined w/ Regional for post-op pain   Induction:   PONV Risk Score and Plan: 3 and Ondansetron, Dexamethasone, Midazolam and Treatment may vary due to age or medical condition  Airway Management Planned: Oral ETT  Additional Equipment:   Intra-op Plan:   Post-operative Plan:   Informed Consent: I have reviewed the patients History and Physical, chart, labs and discussed the procedure including the risks, benefits and alternatives for the proposed anesthesia with the patient or authorized representative who has indicated his/her understanding and acceptance.     Plan Discussed with:   Anesthesia Plan Comments:         Anesthesia Quick Evaluation

## 2017-04-27 NOTE — Anesthesia Procedure Notes (Addendum)
Procedure Name: Intubation Date/Time: 04/27/2017 10:50 AM Performed by: Darlyne Russian Pre-anesthesia Checklist: Patient identified, Emergency Drugs available, Suction available, Patient being monitored and Timeout performed Patient Re-evaluated:Patient Re-evaluated prior to induction Oxygen Delivery Method: Circle system utilized Preoxygenation: Pre-oxygenation with 100% oxygen Induction Type: IV induction Ventilation: Mask ventilation without difficulty Laryngoscope Size: Mac and 3 Grade View: Grade II Tube type: Oral Tube size: 7.0 mm Number of attempts: 1 Airway Equipment and Method: Stylet Placement Confirmation: ETT inserted through vocal cords under direct vision,  positive ETCO2 and breath sounds checked- equal and bilateral Secured at: 22 cm Tube secured with: Tape Dental Injury: Teeth and Oropharynx as per pre-operative assessment

## 2017-04-27 NOTE — Transfer of Care (Signed)
Immediate Anesthesia Transfer of Care Note  Patient: [name] [name]  Procedure(s) Performed: Procedure(s): REVERSE SHOULDER ARTHROPLASTY (Right)  Patient Location: PACU  Anesthesia Type:General and Regional  Level of Consciousness: drowsy and patient cooperative  Airway & Oxygen Therapy: Patient Spontanous Breathing and Patient connected to nasal cannula oxygen  Post-op Assessment: Report given to RN and Post -op Vital signs reviewed and stable  Post vital signs: Reviewed and stable  Last Vitals:  Vitals:   04/27/17 1025 04/27/17 1313  BP: (!) 130/59 (!) 122/58  Pulse: 81 78  Resp: 15 (!) 25  Temp:  (!) 36.2 C  SpO2: 98% 99%    Last Pain:  Vitals:   04/27/17 1313  TempSrc: Temporal  PainSc: 0-No pain         Complications: No apparent anesthesia complications

## 2017-04-27 NOTE — H&P (Signed)
Paper H&P to be scanned into permanent record. H&P reviewed and patient re-examined. No changes. 

## 2017-04-27 NOTE — Op Note (Signed)
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  Post-Op Diagnosis:   Same  Procedure:   Reverse right total shoulder arthroplasty.  Surgeon:   Pascal Lux, MD  Assistant:   Lerry Liner, RNFA  Anesthesia:   General endotracheal with an interscalene block placed preoperatively by the anesthesiologist.  Findings:   As above.  Complications:   None  EBL:  250 cc  Fluids:   1000 cc crystalloid  UOP:   None  TT:   None  Drains:   None  Closure:   Staples  Implants:   All press-fit Biomet Comprehensive system with a #10 mini-humeral stem, a 44 mm humeral tray with a standard insert, and a mini-base plate with a 36 mm glenosphere.  Brief Clinical Note:   The patient is a 83 year old female with a long history of gradually worsening right shoulder pain. Her symptoms have progressed despite medications, activity modification, etc. Her history and examination are consistent with a massive irreparable rotator cuff tear with cuff arthropathy, all of which were confirmed by MRI scan. The patient presents at this time for a reverse right total shoulder arthroplasty.  Procedure:   The patient underwent placement of an interscalene block by the anesthesiologist in the preoperative holding area before being brought into the operating room and lain in the supine position. The patient then underwent general endotracheal intubation and anesthesia before being repositioned in the beach chair position using the beach chair positioner. The right shoulder and upper extremity were prepped with ChloraPrep solution before being draped sterilely. Preoperative antibiotics were administered. A standard anterior approach to the shoulder was made through an approximately 4-5 inch incision. The incision was carried down through the subcutaneous tissues to  expose the deltopectoral fascia. The interval between the deltoid and pectoralis muscles was identified and this plane developed, retracting the cephalic vein laterally with the deltoid muscle. The conjoined tendon was identified. Its lateral margin was dissected and the Kolbel self-retraining retractor inserted. The "three sisters" were identified and cauterized. Bursal tissues were removed to improve visualization. The subscapularis tendon was released from its attachment to the lesser tuberosity 1 cm proximal to its insertion and several tagging sutures placed. The inferior capsule was released with care after identifying and protecting the axillary nerve. The proximal humeral cut was made at approximately 30 of retroversion using the extra-medullary guide.   Attention was redirected to the glenoid. The labrum was debrided circumferentially before the center of the glenoid was marked with electrocautery. The guidewire was drilled into the glenoid neck using the appropriate guide. After verifying its position, it was overreamed with the mini-baseplate reamer to create a flat surface. The permanent mini-baseplate was impacted into place. It was stabilized with a 25 x 6.5 mm central screw and four peripheral screws. Locking screws were placed superiorly, inferiorly, and posteriorly while a nonlocking screw was placed anteriorly. The permanent 36 mm glenosphere was then impacted into place and its Morse taper locking mechanism verified using manual distraction.  Attention was directed to the humeral side. The humeral canal was reamed sequentially beginning with the end-cutting reamer then progressing from a 4 mm reamer up to a 10 mm reamer. This provided excellent circumferential chatter. The canal was broached beginning with a #7 broach and progressing to a #10 broach. This was left in place and a trial reduction performed using  the standard trial humeral platform. The arm demonstrated excellent range of motion  as the hand could be brought across the chest to the opposite shoulder and brought to the top of the patient's head and to the patient's ear. The shoulder appeared stable throughout this range of motion. The joint was dislocated and the trial components removed. The permanent #10 mini-stem was impacted into place with care taken to maintain the appropriate version. The permanent 44 mm humeral platform with the standard insert was put together on the back table and impacted into place. Again, the [facility] taper locking mechanism was verified using manual distraction. The shoulder was relocated using two finger pressure and again placed through a range of motion with the findings as described above.  The wound was copiously irrigated with bacitracin saline solution using the jet lavage system before a total of 20 cc of Exparel diluted out to 60 cc with normal saline and 30 cc of 0.5% Sensorcaine with epinephrine was injected into the pericapsular and peri-incisional tissues to help with postoperative analgesia. The subscapularis tendon was reapproximated using #2 FiberWire interrupted sutures. The deltopectoral interval was closed using #0 Vicryl interrupted sutures before the subcutaneous tissues were closed using 2-0 Vicryl interrupted sutures. The skin was closed using staples. Prior to closing the skin, 1 g of transexemic acid in 10 cc of normal saline was injected intra-articularly to help with postoperative bleeding. A sterile occlusive dressing was applied to the wound before the arm was placed into a shoulder immobilizer with an abduction pillow. A Polar Care system also was applied to the shoulder. The patient was then transferred back to a hospital bed before being awakened, extubated, and returned to the recovery room in satisfactory condition after tolerating the procedure well.

## 2017-04-27 NOTE — Anesthesia Post-op Follow-up Note (Signed)
Anesthesia QCDR form completed.        

## 2017-04-27 NOTE — Anesthesia Procedure Notes (Signed)
Anesthesia Regional Block: Interscalene brachial plexus block   Pre-Anesthetic Checklist: ,, timeout performed, Correct Patient, Correct Site, Correct Laterality, Correct Procedure, Correct Position, site marked, Risks and benefits discussed,  Surgical consent,  Pre-op evaluation,  At surgeon's request and post-op pain management  Laterality: Right  Prep: chloraprep       Needles:  Injection technique: Single-shot  Needle Type: Echogenic Stimulator Needle     Needle Length: 5cm  Needle Gauge: 22     Additional Needles:   Procedures:, nerve stimulator,,, ultrasound used (permanent image in chart),,,,   Nerve Stimulator or Paresthesia:  Response: biceps flexion, 0.8 mA,   Additional Responses:   Narrative:  Start time: 04/27/2017 9:53 AM End time: 04/27/2017 10:13 AM Injection made incrementally with aspirations every 5 mL.  Performed by: Personally   Additional Notes: Functioning IV was confirmed and monitors were applied.  A 28mm 22ga Stimuplex needle was used. Sterile prep and drape,hand hygiene and sterile gloves were used.  Negative aspiration and negative test dose prior to incremental administration of local anesthetic. The patient tolerated the procedure well.

## 2017-04-28 ENCOUNTER — Encounter: Payer: Self-pay | Admitting: Surgery

## 2017-04-28 LAB — CBC WITH DIFFERENTIAL/PLATELET
BASOS ABS: 0 10*3/uL (ref 0–0.1)
BASOS PCT: 0 %
Eosinophils Absolute: 0 10*3/uL (ref 0–0.7)
Eosinophils Relative: 0 %
HEMATOCRIT: 31.6 % — AB (ref 35.0–47.0)
HEMOGLOBIN: 10.7 g/dL — AB (ref 12.0–16.0)
Lymphocytes Relative: 31 %
Lymphs Abs: 4.2 10*3/uL — ABNORMAL HIGH (ref 1.0–3.6)
MCH: 31.4 pg (ref 26.0–34.0)
MCHC: 34 g/dL (ref 32.0–36.0)
MCV: 92.5 fL (ref 80.0–100.0)
Monocytes Absolute: 1.1 10*3/uL — ABNORMAL HIGH (ref 0.2–0.9)
Monocytes Relative: 8 %
NEUTROS ABS: 8.1 10*3/uL — AB (ref 1.4–6.5)
NEUTROS PCT: 61 %
Platelets: 227 10*3/uL (ref 150–440)
RBC: 3.41 MIL/uL — AB (ref 3.80–5.20)
RDW: 13.5 % (ref 11.5–14.5)
WBC: 13.4 10*3/uL — AB (ref 3.6–11.0)

## 2017-04-28 LAB — BASIC METABOLIC PANEL
ANION GAP: 5 (ref 5–15)
BUN: 15 mg/dL (ref 6–20)
CHLORIDE: 105 mmol/L (ref 101–111)
CO2: 26 mmol/L (ref 22–32)
CREATININE: 0.51 mg/dL (ref 0.44–1.00)
Calcium: 8.5 mg/dL — ABNORMAL LOW (ref 8.9–10.3)
GFR calc non Af Amer: >60 mL/min (ref >60)
Glucose, Bld: 122 mg/dL — ABNORMAL HIGH (ref 65–99)
POTASSIUM: 4.4 mmol/L (ref 3.5–5.1)
SODIUM: 136 mmol/L (ref 135–145)

## 2017-04-28 LAB — URINE CULTURE

## 2017-04-28 MED ORDER — OXYCODONE HCL 5 MG PO TABS: 5.0000 mg | ORAL_TABLET | ORAL | 0 refills | Status: DC | PRN

## 2017-04-28 MED ORDER — ENOXAPARIN SODIUM 40 MG/0.4ML ~~LOC~~ SOLN: 40.0000 mg | SUBCUTANEOUS | 0 refills | Status: DC

## 2017-04-28 NOTE — Clinical Social Work Note (Signed)
Clinical Social Work Assessment  Patient Details  Name: [name] [name] MRN: [id number] Date of Birth: Dec 07, 1923  Date of referral:  04/28/17               Reason for consult:  Facility Placement                Permission sought to share information with:  Chartered certified accountant granted to share information::  Yes, Verbal Permission Granted  Name::      Chief Executive Officer::   [place]   Relationship::     Contact Information:     Housing/Transportation Living arrangements for the past 2 months:  Charity fundraiser of Information:  Patient, Adult Children, Facility Patient Interpreter Needed:  None Criminal Activity/Legal Involvement Pertinent to Current Situation/Hospitalization:  No - Comment as needed Significant Relationships:  Adult Children, Spouse Lives with:  Spouse Do you feel safe going back to the place where you live?  Yes Need for family participation in patient care:  Yes (Comment)  Care giving concerns:  Patient lives at [facility] independent living with her husband [name] [name].     Social Worker assessment / plan:  Holiday representative (CSW) received SNF consult. Per Seth Bake admissions coordinator at [facility] patient is an independent living resident and has made arrangements to come to the [facility] building for short term rehab. Per Seth Bake patient's husband [name] [name] is in respite care at the [facility] building now. CSW made Seth Bake aware that medicare will not pay for SNF because patient will not have a 3 night inpatient stay at the hospital. Per Seth Bake patient can use her 3 respite days and pay privately after that to stay at the SNF building at [facility].  CSW met with patient and her daughter [name] [name] was at bedside. Patient was alert and oriented X4 and was sitting up in the bed. CSW introduced self and explained role of CSW department. Per patient she is agreeable to go to [facility] and pay out of pocket  and understands that medicare will not pay for SNF. Per patient her husband is already in respite care at [facility] waiting for her.   Patient is medically stable for D/C today to [facility]. Patient will go to room 219. RN will call report at [phone]. [facility] will provide transport and pick patient up at 1 pm. CSW sent D/C orders to [facility] via [place]. Patient is aware of above and her daughter [name] [name] is at bedside and aware of above. Please reconsult if future social work needs arise. CSW signing off.    Employment status:  Retired Forensic scientist:  Medicare PT Recommendations:   (DC plan and follow up therapy as arranged by Psychologist, sport and exercise) Information / Referral to community resources:  [place]  Patient/Family's Response to care:  Patient is agreeable to go to [facility].   Patient/Family's Understanding of and Emotional Response to Diagnosis, Current Treatment, and Prognosis:  Patient and her daughter were very pleasant and thanked CSW for assistance.   Emotional Assessment Appearance:  Appears stated age Attitude/Demeanor/Rapport:    Affect (typically observed):  Accepting, Adaptable, Pleasant Orientation:  Oriented to Self, Oriented to Place, Oriented to  Time, Oriented to Situation Alcohol / Substance use:  Not Applicable Psych involvement (Current and /or in the community):  No (Comment)  Discharge Needs  Concerns to be addressed:  Discharge Planning Concerns Readmission within the last  30 days:  No Current discharge risk:  None Barriers to Discharge:  No Barriers Identified   Petra Dumler, Veronia Beets, LCSW 04/28/2017, 10:58 AM

## 2017-04-28 NOTE — Clinical Social Work Placement (Signed)
   CLINICAL SOCIAL WORK PLACEMENT  NOTE  Date:  04/28/2017  Patient Details  Name: [name] [name] MRN: [id number] Date of Birth: Jan 12, 1924  Clinical Social Work is seeking post-discharge placement for this patient at the [place] level of care (*CSW will initial, date and re-position this form in  chart as items are completed):  Yes   Patient/family provided with [place] Work Department's list of facilities offering this level of care within the geographic area requested by the patient (or if unable, by the patient's family).  Yes   Patient/family informed of their freedom to choose among providers that offer the needed level of care, that participate in Medicare, Medicaid or managed care program needed by the patient, have an available bed and are willing to accept the patient.  Yes   Patient/family informed of [facility]'s ownership interest in [facility] and [facility], as well as of the fact that they are under no obligation to receive care at these facilities.  PASRR submitted to EDS on       PASRR number received on       Existing PASRR number confirmed on 04/27/17     FL2 transmitted to all facilities in geographic area requested by pt/family on 04/27/17     FL2 transmitted to all facilities within larger geographic area on       Patient informed that his/her managed care company has contracts with or will negotiate with certain facilities, including the following:        Yes   Patient/family informed of bed offers received.  Patient chooses bed at  [facility] )     Physician recommends and patient chooses bed at      Patient to be transferred to  [facility] ) on 04/28/17.  Patient to be transferred to facility by  [facility] will provide transport. )     Patient family notified on 04/28/17 of transfer.  Name of family member notified:   (Patient's daughter Ann Held is at bedside and aware of D/C today. )      PHYSICIAN       Additional Comment:    _______________________________________________ Tamikka Pilger, Veronia Beets, LCSW 04/28/2017, 10:57 AM

## 2017-04-28 NOTE — Progress Notes (Signed)
Report have been called into [facility]. [facility] is coming to pick patient up at 13:00 today. Patient have no complaints SS of distress.

## 2017-04-28 NOTE — Progress Notes (Signed)
Physical Therapy Treatment Patient Details Name: [name] [name] MRN: [id number] DOB: 03/28/1924 Today's Date: 04/28/2017    History of Present Illness Pt. is a 83 y.o. female who was admitted to [facility] for a Reverse Total Shoulder Replacement. Pt. PMHx includes: Allergic Rhinitis, Aortic Sclerosis, Arthritis, Dialstolic Cysfunction, Seanoal allergies, Hyperlipidemia, osteoarthritis, osteoporosis, overactive Bladder. and urge incontinence.    PT Comments    Pt agreeable to PT; reports 4/10 pain right shoulder. Pt states she had a horrible night due to pain, but improving this day. Pt trialed with quad cane and hemi walker for transfers and ambulation and noted to have much increased steadiness and confidence with ambulation with hemi walker. Pt progressed ambulation distance with hemi walker and demonstrates good understanding of use. Pt plans to discharge today to skilled nursing facility where her spouse is currently receiving PT. Pt will need hemi walker prior to discharge.    Follow Up Recommendations  DC plan and follow up therapy as arranged by surgeon     Equipment Recommendations  Other (comment) (Hemi walker)    Recommendations for Other Services       Precautions / Restrictions Precautions Precautions: Shoulder Shoulder Interventions: Shoulder sling/immobilizer Required Braces or Orthoses: Other Brace/Splint (R shoulder abduction sling) Restrictions Weight Bearing Restrictions: Yes RUE Weight Bearing: Non weight bearing    Mobility  Bed Mobility Overal bed mobility: Needs Assistance Bed Mobility: Supine to Sit     Supine to sit: Min assist     General bed mobility comments: Good effort; requires light assist to completely elevate trunk. Able to scoot to edge of bed witout assist  Transfers Overall transfer level: Needs assistance Equipment used: Hemi-walker;Quad cane Transfers: Sit to/from Stand Sit to Stand: Min guard         General transfer comment:  Performed with both QC and hemi walker; pt demonstrates improved confidence and steadiness in stand with hemi walker  Ambulation/Gait Ambulation/Gait assistance: Min guard Ambulation Distance (Feet): 60 Feet (inital walk 20 feet with QC; then 60 with hemi walker) Assistive device: Quad cane;Hemi-walker Gait Pattern/deviations: Step-through pattern;Decreased stride length Gait velocity: reduced Gait velocity interpretation: <1.8 ft/sec, indicative of risk for recurrent falls General Gait Details: Much greater confidence, posture and steadiness noted with hemi walker versus QC   Stairs            Wheelchair Mobility    Modified Rankin (Stroke Patients Only)       Balance                                            Cognition Arousal/Alertness: Awake/alert Behavior During Therapy: WFL for tasks assessed/performed Overall Cognitive Status: Within Functional Limits for tasks assessed                                        Exercises Other Exercises Other Exercises: Educated on use of ball on sling for strengthening as well as wrist range of motion to be performed independently throughout the day Other Exercises: Educated on seated support of RUE as well as comfort relief from sling straps    General Comments        Pertinent Vitals/Pain Pain Assessment: 0-10 Pain Score: 4  Pain Location: right shoulder Pain Descriptors / Indicators: Constant;Aching;Discomfort Pain Intervention(s): Monitored during session;Premedicated  before session;Repositioned    Home Living Family/patient expects to be discharged to:: Skilled nursing facility Living Arrangements: Spouse/significant other             Additional Comments: Pt.     Prior Function Level of Independence: Independent      Comments: Pt. was independent with ADLs, light IADLs, with minimal driving. Personal care aides come into the home to assist the husband. Pt. husband is  currently in rehab at [facility].     PT Goals (current goals can now be found in the care plan section) Progress towards PT goals: Progressing toward goals    Frequency    BID      PT Plan Current plan remains appropriate    Co-evaluation              AM-PAC PT "6 Clicks" Daily Activity  Outcome Measure  Difficulty turning over in bed (including adjusting bedclothes, sheets and blankets)?: A Little Difficulty moving from lying on back to sitting on the side of the bed? : Unable Difficulty sitting down on and standing up from a chair with arms (e.g., wheelchair, bedside commode, etc,.)?: Unable Help needed moving to and from a bed to chair (including a wheelchair)?: A Little Help needed walking in hospital room?: A Little Help needed climbing 3-5 steps with a railing? : A Lot 6 Click Score: 13    End of Session   Activity Tolerance: Patient tolerated treatment well Patient left: with call bell/phone within reach;with family/visitor present;with chair alarm set;in chair   PT Visit Diagnosis: Difficulty in walking, not elsewhere classified (R26.2);Unsteadiness on feet (R26.81);Muscle weakness (generalized) (M62.81)     Time: 1610-9604 PT Time Calculation (min) (ACUTE ONLY): 20 min  Charges:  $Gait Training: 8-22 mins                    G Codes:  Functional Assessment Tool Used: AM-PAC 6 Clicks Basic Mobility     Larae Grooms, PTA 04/28/2017, 12:14 PM

## 2017-04-28 NOTE — Discharge Summary (Signed)
Physician Discharge Summary  Subjective: 1 Day Post-Op Procedure(s) (LRB): REVERSE SHOULDER ARTHROPLASTY (Right) Patient reports pain as mild.   Patient seen in rounds with Dr. Roland Rack. Patient is well, and has had no acute complaints or problems Patient is ready to go to Rehab  Physician Discharge Summary  Patient ID: [name] [name] MRN: [id number] DOB/AGE: 09/25/1923 83 y.o.  Admit date: 04/27/2017 Discharge date: 04/28/2017  Admission Diagnoses:  Discharge Diagnoses:  Active Problems:   Status post reverse total shoulder replacement, right   Discharged Condition: fair  Hospital Course: The patient is POD #1 from a reverse shoulder replacement on the Right.  She is doing well since surgery.  Her pain is moderate as the pain medication is beginning to help.  She is stable with Labs.  Her vitals are with slight Hypotension and Tachycardia.    Treatments: surgery:  Reverse right total shoulder arthroplasty.  Surgeon:   Pascal Lux, MD  Assistant:   Lerry Liner, RNFA  Anesthesia:   General endotracheal with an interscalene block placed preoperatively by the anesthesiologist.  Findings:   As above.  Complications:   None  EBL:  250 cc  Fluids:   1000 cc crystalloid  UOP:   None  TT:   None  Drains:   None  Closure:   Staples  Implants:   All press-fit Bi[employer] Comprehensive system with a #10 mini-humeral stem, a 44 mm humeral tray with a standard insert, and a mini-base plate with a 36 mm glenosphere.  Discharge Exam: Blood pressure (!) 111/42, pulse (!) 104, temperature 98.4 F (36.9 C), temperature source Oral, resp. rate 18, height 5\' 5"  (1.651 m), weight 59.9 kg (132 lb), SpO2 97 %.   Disposition: 03-Skilled Nursing Facility   Allergies as of 04/28/2017      Reactions   Demerol [meperidine]    dizzy   Shellfish Allergy       Medication List    STOP taking these medications   HYDROcodone-acetaminophen 5-325 MG tablet Commonly  known as:  NORCO/VICODIN     TAKE these medications   acetaminophen 650 MG CR tablet Commonly known as:  TYLENOL Take 650 mg by mouth 3 (three) times daily.   Acidophilus 100 MG Caps Take by mouth.   ALLERGY MED PO Take 1 tablet by mouth daily.   aspirin 81 MG tablet Take 81 mg by mouth daily.   celecoxib 200 MG capsule Commonly known as:  CELEBREX TAKE 1 CAPSULE DAILY   [employer] VITAMIN D3 1000 units capsule Generic drug:  Cholecalciferol TAKE ONE CAPSULE BY MOUTH DAILY   DETROL LA 4 MG 24 hr capsule Generic drug:  tolterodine TAKE 1 CAPSULE DAILY   enoxaparin 40 MG/0.4ML injection Commonly known as:  LOVENOX Inject 0.4 mLs (40 mg total) into the skin daily.   GAS-X PO Take by mouth 2 (two) times daily.   hydrocortisone 2.5 % cream Apply topically 3 (three) times daily as needed.   levocetirizine 5 MG tablet Commonly known as:  XYZAL Take 5 mg by mouth every evening.   metroNIDAZOLE 0.75 % vaginal gel Commonly known as:  METROGEL insert 1 applicatorful vaginally twice a day   multivitamin tablet Take 1 tablet by mouth daily.   oxyCODONE 5 MG immediate release tablet Commonly known as:  Oxy IR/ROXICODONE Take 1-2 tablets (5-10 mg total) by mouth every 4 (four) hours as needed for breakthrough pain.   polyethylene glycol packet Commonly known as:  MIRALAX / GLYCOLAX Take 17 g by  mouth daily as needed.   PROBIOTIC ADVANCED PO Take 1 capsule by mouth daily.   STOOL SOFTENER 100 MG capsule Generic drug:  docusate sodium Take 100 mg by mouth daily as needed for mild constipation.            Discharge Care Instructions        Start     Ordered   04/28/17 0000  enoxaparin (LOVENOX) 40 MG/0.4ML injection  Every 24 hours     04/28/17 0702   04/28/17 0000  oxyCODONE (OXY IR/ROXICODONE) 5 MG immediate release tablet  Every 4 hours PRN     04/28/17 3419     Follow-up Information    Poggi, Marshall Cork, MD Follow up in 2 week(s).   Specialty:  Surgery Why:   For staple removal Contact information: [place] [address] [phone]           Signed: Prescott Parma, Myracle Febres 04/28/2017, 8:12 AM   Objective: Vital signs in last 24 hours: Temp:  [96.9 F (36.1 C)-98.4 F (36.9 C)] 98.4 F (36.9 C) (09/26 0718) Pulse Rate:  [68-104] 104 (09/26 0718) Resp:  [13-25] 18 (09/26 0412) BP: (111-158)/(42-97) 111/42 (09/26 0718) SpO2:  [94 %-100 %] 97 % (09/26 0718) Weight:  [59.9 kg (132 lb)] 59.9 kg (132 lb) (09/25 0825)  Intake/Output from previous day:  Intake/Output Summary (Last 24 hours) at 04/28/17 0812 Last data filed at 04/27/17 2300  Gross per 24 hour  Intake          1751.25 ml  Output              250 ml  Net          1501.25 ml    Intake/Output this shift: No intake/output data recorded.  Labs:  Recent Labs  04/27/17 0748 04/28/17 0412  HGB 12.8 10.7*    Recent Labs  04/27/17 0748 04/28/17 0412  WBC 8.7 13.4*  RBC 3.89 3.41*  HCT 36.6 31.6*  PLT 258 227    Recent Labs  04/27/17 0748 04/28/17 0412  NA 136 136  K 4.4 4.4  CL 101 105  CO2 27 26  BUN 19 15  CREATININE 0.50 0.51  GLUCOSE 99 122*  CALCIUM 9.3 8.5*    Recent Labs  04/27/17 0748  INR 0.97    EXAM: General - Patient is Alert and Oriented Extremity - Neurologically intact Intact pulses distally No cellulitis present Compartment soft Incision - clean, dry, no drainage Motor Function -  Moving fingers and wrist well  Assessment/Plan: 1 Day Post-Op Procedure(s) (LRB): REVERSE SHOULDER ARTHROPLASTY (Right) Procedure(s) (LRB): REVERSE SHOULDER ARTHROPLASTY (Right) Past Medical History:  Diagnosis Date  . Allergic rhinitis due to pollen    some shellfish also  . Aortic sclerosis 5/12   On echo. no stenosis  . Arthritis   . Diastolic dysfunction    stress echo otherwise normal 1/04. Repeat 5/11 also negative  . History of seasonal allergies   . Hyperlipidemia   . Osteoarthritis,  multiple sites   . Osteoporosis   . Overactive bladder   . Urge incontinence    Active Problems:   Status post reverse total shoulder replacement, right  Estimated body mass index is 21.97 kg/m as calculated from the following:   Height as of this encounter: 5\' 5"  (1.651 m).   Weight as of this encounter: 59.9 kg (132 lb). Advance diet Up with therapy D/C IV fluids Discharge to SNF Diet - Regular  diet Follow up - in 2 weeks Activity - Shoulder immobilizer on, except Physical Therapy for ROM. Disposition - Rehab Condition Upon Discharge - Stable DVT Prophylaxis - Lovenox and TED hose  Reche Dixon, PA-C Orthopaedic Surgery 04/28/2017, 8:12 AM

## 2017-04-28 NOTE — Care Management (Signed)
Patient now planning to go to [facility] today. CSW following.

## 2017-04-28 NOTE — Discharge Instructions (Signed)
INSTRUCTIONS AFTER Surgery  o Remove items at home which could result in a fall. This includes throw rugs or furniture in walking pathways o ICE to the affected joint every three hours while awake for 30 minutes at a time, for at least the first 3-5 days, and then as needed for pain and swelling.  Continue to use ice for pain and swelling. You may notice swelling that will progress down to the foot and ankle.  This is normal after surgery.  Elevate your leg when you are not up walking on it.   o Continue to use the breathing machine you got in the hospital (incentive spirometer) which will help keep your temperature down.  It is common for your temperature to cycle up and down following surgery, especially at night when you are not up moving around and exerting yourself.  The breathing machine keeps your lungs expanded and your temperature down.   DIET:  As you were doing prior to hospitalization, we recommend a well-balanced diet.  DRESSING / WOUND CARE / SHOWERING  The surgical dressing is water proof and can allow showering. The patient needs to have her wound evaluated. Her staples will be removed at 2 weeks at the orthopedic department.  ACTIVITY  o Increase activity slowly as tolerated, but follow the weight bearing instructions below.   o No driving for 6 weeks or until further direction given by your physician.  You cannot drive while taking narcotics.  o No lifting or carrying greater than 10 lbs. until further directed by your surgeon. o Avoid periods of inactivity such as sitting longer than an hour when not asleep. This helps prevent blood clots.  o You may return to work once you are authorized by your doctor.     WEIGHT BEARING  Weight-bearing as tolerated. The right shoulder will be in the shoulder immobilizer with gentle range of motion.   EXERCISES Right shoulder gentle passive range of motion involving the shoulder, elbow, and wrist.  CONSTIPATION  Constipation is  defined medically as fewer than three stools per week and severe constipation as less than one stool per week.  Even if you have a regular bowel pattern at home, your normal regimen is likely to be disrupted due to multiple reasons following surgery.  Combination of anesthesia, postoperative narcotics, change in appetite and fluid intake all can affect your bowels.   YOU MUST use at least one of the following options; they are listed in order of increasing strength to get the job done.  They are all available over the counter, and you may need to use some, POSSIBLY even all of these options:    Drink plenty of fluids (prune juice may be helpful) and high fiber foods Colace 100 mg by mouth twice a day  Senokot for constipation as directed and as needed Dulcolax (bisacodyl), take with full glass of water  Miralax (polyethylene glycol) once or twice a day as needed.  If you have tried all these things and are unable to have a bowel movement in the first 3-4 days after surgery call either your surgeon or your primary doctor.    If you experience loose stools or diarrhea, hold the medications until you stool forms back up.  If your symptoms do not get better within 1 week or if they get worse, check with your doctor.  If you experience "the worst abdominal pain ever" or develop nausea or vomiting, please contact the office immediately for further recommendations for treatment.  ITCHING:  If you experience itching with your medications, try taking only a single pain pill, or even half a pain pill at a time.  You can also use Benadryl over the counter for itching or also to help with sleep.   TED HOSE STOCKINGS:  Use stockings on both legs until for at least 2 weeks or as directed by physician office. They may be removed at night for sleeping.  MEDICATIONS:  See your medication summary on the After Visit Summary that nursing will review with you.  You may have some home medications which will be placed  on hold until you complete the course of blood thinner medication.  It is important for you to complete the blood thinner medication as prescribed.  PRECAUTIONS:  If you experience chest pain or shortness of breath - call 911 immediately for transfer to the hospital emergency department.   If you develop a fever greater that 101 F, purulent drainage from wound, increased redness or drainage from wound, foul odor from the wound/dressing, or calf pain - CONTACT YOUR SURGEON.                                                   FOLLOW-UP APPOINTMENTS:  If you do not already have a post-op appointment, please call the office for an appointment to be seen by your surgeon.  Guidelines for how soon to be seen are listed in your After Visit Summary, but are typically between 1-4 weeks after surgery.  OTHER INSTRUCTIONS:   Polar Care will be utilized to decrease swelling. Use the Polar Care ice pack at least 3 times a day for 1 hour.  MAKE SURE YOU:   Understand these instructions.   Get help right away if you are not doing well or get worse.    Thank you for letting us be a part of your medical care team.  It is a privilege we respect greatly.  We hope these instructions will help you stay on track for a fast and full recovery!

## 2017-04-28 NOTE — Evaluation (Signed)
Occupational Therapy Evaluation Patient Details Name: NYELA CORTINAS MRN: [id number] DOB: [birth year] Today's Date: 04/28/2017    History of Present Illness Pt. is a 83 y.o. female who was admitted to [facility] for a Reverse Total Shoulder Replacement. Pt. PMHx includes: Allergic Rhinitis, Aortic Sclerosis, Arthritis, Dialstolic Cysfunction, Seanoal allergies, Hyperlipidemia, osteoarthritis, osteoporosis, overactive Bladder. and urge incontinence.   Clinical Impression   Pt. Is a 83 y.o. female who was admitted to [facility] for a Reverse Total Shoulder Replacement. Pt. Resides with her husband in an independent cottage at [facility]. Pt. Was independent with ADLs, light IADLs, and minimal driving. They have personal care aides come in to assist her husband with daily care. Pt. reports that her husband is currently in rehab at [facility]. Pt. plans to go to SNF rehab at [facility] upon discharge prior to returning home. Once she returns home, pt. Reports she plans to have a personal care aide assist her some as well. Pt. education was provided about A/E use for ADLs, and one armed dressing techniques.  Pt. could benefit from skilled OT services for ADL training, A/E training, functional mobility during ADLs, IADLs, and pt. education about  home modification and DME. Pt. Will benefit from follow-up OT services.    Follow Up Recommendations  SNF    Equipment Recommendations       Recommendations for Other Services       Precautions / Restrictions Precautions Precautions: Shoulder Shoulder Interventions: Shoulder sling/immobilizer Restrictions Weight Bearing Restrictions: Yes RUE Weight Bearing: Non weight bearing                                                    ADL either performed or assessed with clinical judgement   ADL Overall ADL's : Needs assistance/impaired Eating/Feeding: Minimal assistance   Grooming: Minimal assistance   Upper Body Bathing: Maximal  assistance   Lower Body Bathing: Maximal assistance   Upper Body Dressing : Maximal assistance   Lower Body Dressing: Maximal assistance               Functional mobility during ADLs: Min guard General ADL Comments: Pt. education was provided about A/E use for ADLS, and one armed dressing techniques. Handouts were provided.     Vision  No change from baseline, glasses for reading.       Perception     Praxis      Pertinent Vitals/Pain Pain Assessment: 0-10 Pain Score: 5  Pain Location: right shoulder Pain Descriptors / Indicators: Aching Pain Intervention(s): Limited activity within patient's tolerance;Monitored during session     Hand Dominance Right   Extremity/Trunk Assessment Upper Extremity Assessment Upper Extremity Assessment: Overall WFL for tasks assessed           Communication Communication Communication: No difficulties   Cognition Arousal/Alertness: Awake/alert   Overall Cognitive Status: Within Functional Limits for tasks assessed                                     General Comments       Exercises     Shoulder Instructions      Home Living Family/patient expects to be discharged to:: Skilled nursing facility Living Arrangements: Spouse/significant other  Additional Comments: Pt.       Prior Functioning/Environment Level of Independence: Independent        Comments: Pt. was independent with ADLs, light IADLs, with minimal driving. Personal care aides come into the home to assist the husband. Pt. husband is currently in rehab at [facility].          OT Problem List: Decreased strength;Decreased knowledge of use of DME or AE;Decreased coordination;Decreased cognition;Decreased activity tolerance;Decreased safety awareness      OT Treatment/Interventions: Self-care/ADL training;DME and/or AE instruction;Patient/family education;Therapeutic activities    OT  Goals(Current goals can be found in the care plan section)    OT Frequency: Min 2X/week   Barriers to D/C:            Co-evaluation              AM-PAC PT "6 Clicks" Daily Activity     Outcome Measure Help from another person eating meals?: A Little Help from another person taking care of personal grooming?: A Little Help from another person toileting, which includes using toliet, bedpan, or urinal?: A Lot Help from another person bathing (including washing, rinsing, drying)?: A Lot Help from another person to put on and taking off regular upper body clothing?: A Lot Help from another person to put on and taking off regular lower body clothing?: A Lot 6 Click Score: 14   End of Session    Activity Tolerance: Patient tolerated treatment well Patient left: in bed                   Time: 4008-6761 OT Time Calculation (min): 22 min Charges:  OT General Charges $OT Visit: 1 Visit OT Evaluation $OT Eval Moderate Complexity: 1 Mod G-Codes: OT G-codes **NOT FOR INPATIENT CLASS** Functional Limitation: Self care Self Care Current Status (P5093): 0 percent impaired, limited or restricted Self Care Goal Status (O6712): At least 1 percent but less than 20 percent impaired, limited or restricted   Harrel Carina, MS, OTR/L   Harrel Carina, MS, OTR/L 04/28/2017, 10:22 AM

## 2017-04-28 NOTE — Progress Notes (Signed)
  Subjective: 1 Day Post-Op Procedure(s) (LRB): REVERSE SHOULDER ARTHROPLASTY (Right) Patient reports pain as moderate.   Patient seen in rounds with Dr. Roland Rack. Patient is well, and has had no acute complaints or problems Plan is to go Skilled nursing facility after hospital stay. Negative for chest pain and shortness of breath Fever: no Gastrointestinal: Negative for nausea and vomiting  Objective: Vital signs in last 24 hours: Temp:  [96.9 F (36.1 C)-98.2 F (36.8 C)] 98.2 F (36.8 C) (09/26 0412) Pulse Rate:  [68-92] 82 (09/26 0412) Resp:  [13-25] 18 (09/26 0412) BP: (113-158)/(47-97) 113/47 (09/26 0412) SpO2:  [94 %-100 %] 95 % (09/26 0412) Weight:  [59.9 kg (132 lb)] 59.9 kg (132 lb) (09/25 0825)  Intake/Output from previous day:  Intake/Output Summary (Last 24 hours) at 04/28/17 0656 Last data filed at 04/27/17 2300  Gross per 24 hour  Intake          1751.25 ml  Output              250 ml  Net          1501.25 ml    Intake/Output this shift: Total I/O In: 531.3 [I.V.:531.3] Out: -   Labs:  Recent Labs  04/27/17 0748 04/28/17 0412  HGB 12.8 10.7*    Recent Labs  04/27/17 0748 04/28/17 0412  WBC 8.7 13.4*  RBC 3.89 3.41*  HCT 36.6 31.6*  PLT 258 227    Recent Labs  04/27/17 0748 04/28/17 0412  NA 136 136  K 4.4 4.4  CL 101 105  CO2 27 26  BUN 19 15  CREATININE 0.50 0.51  GLUCOSE 99 122*  CALCIUM 9.3 8.5*    Recent Labs  04/27/17 0748  INR 0.97     EXAM General - Patient is Alert and Oriented Extremity - Neurovascular intact Intact pulses distally Compartment soft  Shoulder immobilizer intact. Dressing/Incision - clean, dry, and intact Motor Function - intact, moving fingers and wrist well on exam.  Past Medical History:  Diagnosis Date  . Allergic rhinitis due to pollen    some shellfish also  . Aortic sclerosis 5/12   On echo. no stenosis  . Arthritis   . Diastolic dysfunction    stress echo otherwise normal 1/04.  Repeat 5/11 also negative  . History of seasonal allergies   . Hyperlipidemia   . Osteoarthritis, multiple sites   . Osteoporosis   . Overactive bladder   . Urge incontinence     Assessment/Plan: 1 Day Post-Op Procedure(s) (LRB): REVERSE SHOULDER ARTHROPLASTY (Right) Active Problems:   Status post reverse total shoulder replacement, right  Estimated body mass index is 21.97 kg/m as calculated from the following:   Height as of this encounter: 5\' 5"  (1.651 m).   Weight as of this encounter: 59.9 kg (132 lb). Advance diet Up with therapy D/C IV fluids Discharge to SNF, with plan for tomorrow Bowel management  DVT Prophylaxis - Lovenox, Foot Pumps and TED hose Weight-Bearing as tolerated to both leg  Reche Dixon, PA-C Orthopaedic Surgery 04/28/2017, 6:56 AM

## 2017-04-28 NOTE — Care Management (Signed)
RNCM spoke with patient's daughter and she states that she was told that patient would not discharge until tomorrow however there's a discharge order in. I have asked that Sherren Mocha PA be paged for medical necessity. Patient lives at [facility] independent living and states she has arranged to stay at their rehab where her husband is currently staying. CSW updated.

## 2017-04-29 ENCOUNTER — Encounter: Payer: Self-pay | Admitting: Surgery

## 2017-04-29 DIAGNOSIS — I519 Heart disease, unspecified: Secondary | ICD-10-CM | POA: Diagnosis not present

## 2017-04-29 DIAGNOSIS — Z741 Need for assistance with personal care: Secondary | ICD-10-CM | POA: Diagnosis not present

## 2017-04-29 DIAGNOSIS — M159 Polyosteoarthritis, unspecified: Secondary | ICD-10-CM | POA: Diagnosis not present

## 2017-04-29 DIAGNOSIS — J301 Allergic rhinitis due to pollen: Secondary | ICD-10-CM | POA: Diagnosis not present

## 2017-04-29 DIAGNOSIS — M19011 Primary osteoarthritis, right shoulder: Secondary | ICD-10-CM | POA: Diagnosis not present

## 2017-04-29 LAB — SURGICAL PATHOLOGY

## 2017-04-30 DIAGNOSIS — Z741 Need for assistance with personal care: Secondary | ICD-10-CM | POA: Diagnosis not present

## 2017-05-03 ENCOUNTER — Ambulatory Visit: Payer: Medicare Other | Admitting: Internal Medicine

## 2017-05-03 DIAGNOSIS — Z471 Aftercare following joint replacement surgery: Secondary | ICD-10-CM | POA: Diagnosis not present

## 2017-05-04 DIAGNOSIS — Z471 Aftercare following joint replacement surgery: Secondary | ICD-10-CM | POA: Diagnosis not present

## 2017-05-05 DIAGNOSIS — Z471 Aftercare following joint replacement surgery: Secondary | ICD-10-CM | POA: Diagnosis not present

## 2017-05-06 DIAGNOSIS — Z471 Aftercare following joint replacement surgery: Secondary | ICD-10-CM | POA: Diagnosis not present

## 2017-05-07 DIAGNOSIS — Z471 Aftercare following joint replacement surgery: Secondary | ICD-10-CM | POA: Diagnosis not present

## 2017-05-10 DIAGNOSIS — Z471 Aftercare following joint replacement surgery: Secondary | ICD-10-CM | POA: Diagnosis not present

## 2017-05-11 DIAGNOSIS — Z471 Aftercare following joint replacement surgery: Secondary | ICD-10-CM | POA: Diagnosis not present

## 2017-05-12 DIAGNOSIS — Z471 Aftercare following joint replacement surgery: Secondary | ICD-10-CM | POA: Diagnosis not present

## 2017-05-13 DIAGNOSIS — Z471 Aftercare following joint replacement surgery: Secondary | ICD-10-CM | POA: Diagnosis not present

## 2017-05-14 DIAGNOSIS — Z471 Aftercare following joint replacement surgery: Secondary | ICD-10-CM | POA: Diagnosis not present

## 2017-05-17 DIAGNOSIS — Z471 Aftercare following joint replacement surgery: Secondary | ICD-10-CM | POA: Diagnosis not present

## 2017-05-18 DIAGNOSIS — Z471 Aftercare following joint replacement surgery: Secondary | ICD-10-CM | POA: Diagnosis not present

## 2017-05-19 DIAGNOSIS — Z471 Aftercare following joint replacement surgery: Secondary | ICD-10-CM | POA: Diagnosis not present

## 2017-05-20 DIAGNOSIS — I519 Heart disease, unspecified: Secondary | ICD-10-CM | POA: Diagnosis not present

## 2017-05-20 DIAGNOSIS — Z471 Aftercare following joint replacement surgery: Secondary | ICD-10-CM | POA: Diagnosis not present

## 2017-05-20 DIAGNOSIS — M19211 Secondary osteoarthritis, right shoulder: Secondary | ICD-10-CM | POA: Diagnosis not present

## 2017-05-20 DIAGNOSIS — J301 Allergic rhinitis due to pollen: Secondary | ICD-10-CM | POA: Diagnosis not present

## 2017-05-21 DIAGNOSIS — Z471 Aftercare following joint replacement surgery: Secondary | ICD-10-CM | POA: Diagnosis not present

## 2017-05-24 DIAGNOSIS — Z471 Aftercare following joint replacement surgery: Secondary | ICD-10-CM | POA: Diagnosis not present

## 2017-05-25 DIAGNOSIS — Z471 Aftercare following joint replacement surgery: Secondary | ICD-10-CM | POA: Diagnosis not present

## 2017-05-26 DIAGNOSIS — Z471 Aftercare following joint replacement surgery: Secondary | ICD-10-CM | POA: Diagnosis not present

## 2017-05-27 DIAGNOSIS — Z471 Aftercare following joint replacement surgery: Secondary | ICD-10-CM | POA: Diagnosis not present

## 2017-05-28 DIAGNOSIS — Z471 Aftercare following joint replacement surgery: Secondary | ICD-10-CM | POA: Diagnosis not present

## 2017-05-31 DIAGNOSIS — Z471 Aftercare following joint replacement surgery: Secondary | ICD-10-CM | POA: Diagnosis not present

## 2017-06-01 DIAGNOSIS — Z471 Aftercare following joint replacement surgery: Secondary | ICD-10-CM | POA: Diagnosis not present

## 2017-06-02 DIAGNOSIS — Z471 Aftercare following joint replacement surgery: Secondary | ICD-10-CM | POA: Diagnosis not present

## 2017-06-03 DIAGNOSIS — J301 Allergic rhinitis due to pollen: Secondary | ICD-10-CM | POA: Diagnosis not present

## 2017-06-03 DIAGNOSIS — J3081 Allergic rhinitis due to animal (cat) (dog) hair and dander: Secondary | ICD-10-CM | POA: Diagnosis not present

## 2017-06-03 DIAGNOSIS — J3089 Other allergic rhinitis: Secondary | ICD-10-CM | POA: Diagnosis not present

## 2017-06-03 DIAGNOSIS — Z471 Aftercare following joint replacement surgery: Secondary | ICD-10-CM | POA: Diagnosis not present

## 2017-06-04 DIAGNOSIS — Z471 Aftercare following joint replacement surgery: Secondary | ICD-10-CM | POA: Diagnosis not present

## 2017-06-07 DIAGNOSIS — Z471 Aftercare following joint replacement surgery: Secondary | ICD-10-CM | POA: Diagnosis not present

## 2017-06-08 DIAGNOSIS — J3081 Allergic rhinitis due to animal (cat) (dog) hair and dander: Secondary | ICD-10-CM | POA: Diagnosis not present

## 2017-06-08 DIAGNOSIS — J301 Allergic rhinitis due to pollen: Secondary | ICD-10-CM | POA: Diagnosis not present

## 2017-06-08 DIAGNOSIS — J3089 Other allergic rhinitis: Secondary | ICD-10-CM | POA: Diagnosis not present

## 2017-06-08 DIAGNOSIS — Z471 Aftercare following joint replacement surgery: Secondary | ICD-10-CM | POA: Diagnosis not present

## 2017-06-09 DIAGNOSIS — Z471 Aftercare following joint replacement surgery: Secondary | ICD-10-CM | POA: Diagnosis not present

## 2017-06-10 DIAGNOSIS — Z471 Aftercare following joint replacement surgery: Secondary | ICD-10-CM | POA: Diagnosis not present

## 2017-06-10 DIAGNOSIS — J301 Allergic rhinitis due to pollen: Secondary | ICD-10-CM | POA: Diagnosis not present

## 2017-06-10 DIAGNOSIS — J3081 Allergic rhinitis due to animal (cat) (dog) hair and dander: Secondary | ICD-10-CM | POA: Diagnosis not present

## 2017-06-10 DIAGNOSIS — J3089 Other allergic rhinitis: Secondary | ICD-10-CM | POA: Diagnosis not present

## 2017-06-11 DIAGNOSIS — Z471 Aftercare following joint replacement surgery: Secondary | ICD-10-CM | POA: Diagnosis not present

## 2017-06-11 DIAGNOSIS — Z96611 Presence of right artificial shoulder joint: Secondary | ICD-10-CM | POA: Diagnosis not present

## 2017-06-14 DIAGNOSIS — Z471 Aftercare following joint replacement surgery: Secondary | ICD-10-CM | POA: Diagnosis not present

## 2017-06-15 DIAGNOSIS — J3089 Other allergic rhinitis: Secondary | ICD-10-CM | POA: Diagnosis not present

## 2017-06-15 DIAGNOSIS — J301 Allergic rhinitis due to pollen: Secondary | ICD-10-CM | POA: Diagnosis not present

## 2017-06-15 DIAGNOSIS — J3081 Allergic rhinitis due to animal (cat) (dog) hair and dander: Secondary | ICD-10-CM | POA: Diagnosis not present

## 2017-06-15 DIAGNOSIS — Z471 Aftercare following joint replacement surgery: Secondary | ICD-10-CM | POA: Diagnosis not present

## 2017-06-18 ENCOUNTER — Telehealth: Payer: Self-pay | Admitting: Internal Medicine

## 2017-06-18 DIAGNOSIS — M25611 Stiffness of right shoulder, not elsewhere classified: Secondary | ICD-10-CM | POA: Diagnosis not present

## 2017-06-18 DIAGNOSIS — R4189 Other symptoms and signs involving cognitive functions and awareness: Secondary | ICD-10-CM | POA: Diagnosis not present

## 2017-06-18 DIAGNOSIS — M6281 Muscle weakness (generalized): Secondary | ICD-10-CM | POA: Diagnosis not present

## 2017-06-18 MED ORDER — OXYCODONE HCL 5 MG PO TABS: 5.0000 mg | ORAL_TABLET | ORAL | 0 refills | Status: DC | PRN

## 2017-06-18 NOTE — Telephone Encounter (Signed)
Pt requesting oxycodone

## 2017-06-18 NOTE — Telephone Encounter (Signed)
I have already filled this at [facility] op for complete shoulder replacement

## 2017-06-18 NOTE — Telephone Encounter (Signed)
Spoke to pt's daughter. Rx up front ready for pickup.  

## 2017-06-18 NOTE — Telephone Encounter (Signed)
Last Rx written by orthopaedic surgeon 04/28/17. Last OV 03/01/2017-CPE. pls advise

## 2017-06-18 NOTE — Telephone Encounter (Signed)
Copied from [place] [phone]. Topic: Quick Communication - See Telephone Encounter >> Jun 18, 2017  9:14 AM Arletha Grippe wrote: CRM for notification. See Telephone encounter for:   06/18/17. Pt was given oxycodone at hospital and at [facility] (she had shoulder surgery),. Pt is released, and is still having physical therapy and pt is in pain. Pt is requesting rx for oxycodone.  Daughter can pick up rx. Cb number is [phone]

## 2017-06-21 DIAGNOSIS — R4189 Other symptoms and signs involving cognitive functions and awareness: Secondary | ICD-10-CM | POA: Diagnosis not present

## 2017-06-21 DIAGNOSIS — M6281 Muscle weakness (generalized): Secondary | ICD-10-CM | POA: Diagnosis not present

## 2017-06-21 DIAGNOSIS — M25611 Stiffness of right shoulder, not elsewhere classified: Secondary | ICD-10-CM | POA: Diagnosis not present

## 2017-06-22 DIAGNOSIS — J3089 Other allergic rhinitis: Secondary | ICD-10-CM | POA: Diagnosis not present

## 2017-06-22 DIAGNOSIS — J301 Allergic rhinitis due to pollen: Secondary | ICD-10-CM | POA: Diagnosis not present

## 2017-06-22 DIAGNOSIS — J3081 Allergic rhinitis due to animal (cat) (dog) hair and dander: Secondary | ICD-10-CM | POA: Diagnosis not present

## 2017-06-23 DIAGNOSIS — M25611 Stiffness of right shoulder, not elsewhere classified: Secondary | ICD-10-CM | POA: Diagnosis not present

## 2017-06-23 DIAGNOSIS — M6281 Muscle weakness (generalized): Secondary | ICD-10-CM | POA: Diagnosis not present

## 2017-06-23 DIAGNOSIS — R4189 Other symptoms and signs involving cognitive functions and awareness: Secondary | ICD-10-CM | POA: Diagnosis not present

## 2017-06-28 DIAGNOSIS — M6281 Muscle weakness (generalized): Secondary | ICD-10-CM | POA: Diagnosis not present

## 2017-06-28 DIAGNOSIS — R4189 Other symptoms and signs involving cognitive functions and awareness: Secondary | ICD-10-CM | POA: Diagnosis not present

## 2017-06-28 DIAGNOSIS — M25611 Stiffness of right shoulder, not elsewhere classified: Secondary | ICD-10-CM | POA: Diagnosis not present

## 2017-06-29 DIAGNOSIS — J3089 Other allergic rhinitis: Secondary | ICD-10-CM | POA: Diagnosis not present

## 2017-06-29 DIAGNOSIS — J3081 Allergic rhinitis due to animal (cat) (dog) hair and dander: Secondary | ICD-10-CM | POA: Diagnosis not present

## 2017-06-29 DIAGNOSIS — J301 Allergic rhinitis due to pollen: Secondary | ICD-10-CM | POA: Diagnosis not present

## 2017-06-30 ENCOUNTER — Ambulatory Visit: Payer: Medicare Other | Admitting: Internal Medicine

## 2017-07-05 DIAGNOSIS — M6281 Muscle weakness (generalized): Secondary | ICD-10-CM | POA: Diagnosis not present

## 2017-07-05 DIAGNOSIS — R4189 Other symptoms and signs involving cognitive functions and awareness: Secondary | ICD-10-CM | POA: Diagnosis not present

## 2017-07-05 DIAGNOSIS — M25611 Stiffness of right shoulder, not elsewhere classified: Secondary | ICD-10-CM | POA: Diagnosis not present

## 2017-07-06 ENCOUNTER — Encounter: Payer: Self-pay | Admitting: Internal Medicine

## 2017-07-06 ENCOUNTER — Ambulatory Visit (INDEPENDENT_AMBULATORY_CARE_PROVIDER_SITE_OTHER): Payer: Medicare Other | Admitting: Internal Medicine

## 2017-07-06 VITALS — BP 118/70 | HR 85 | Temp 98.0°F | Wt 131.0 lb

## 2017-07-06 DIAGNOSIS — Z96611 Presence of right artificial shoulder joint: Secondary | ICD-10-CM | POA: Diagnosis not present

## 2017-07-06 DIAGNOSIS — M6281 Muscle weakness (generalized): Secondary | ICD-10-CM | POA: Diagnosis not present

## 2017-07-06 DIAGNOSIS — J301 Allergic rhinitis due to pollen: Secondary | ICD-10-CM | POA: Diagnosis not present

## 2017-07-06 DIAGNOSIS — J3089 Other allergic rhinitis: Secondary | ICD-10-CM | POA: Diagnosis not present

## 2017-07-06 DIAGNOSIS — J3081 Allergic rhinitis due to animal (cat) (dog) hair and dander: Secondary | ICD-10-CM | POA: Diagnosis not present

## 2017-07-06 DIAGNOSIS — R4189 Other symptoms and signs involving cognitive functions and awareness: Secondary | ICD-10-CM | POA: Diagnosis not present

## 2017-07-06 DIAGNOSIS — M25611 Stiffness of right shoulder, not elsewhere classified: Secondary | ICD-10-CM | POA: Diagnosis not present

## 2017-07-06 MED ORDER — METRONIDAZOLE 0.75 % VA GEL: VAGINAL | 5 refills | Status: DC

## 2017-07-06 NOTE — Progress Notes (Signed)
Subjective:    Patient ID: [name] [name], female    DOB: [birth year], 83 y.o.   MRN: [id number]  HPI Here for follow up after extended rehab stay Had total shoulder replacement so wasn't able to do her own personal care for about 6-7 weeks  She is still having pain in right shoulder Doing things she probably shouldn't be--like fixing Christmas tree Not doing husband's care Has used the oxycodone sparingly--occasionally needs it at night  Still needs help with shower Assist with dressing-- anything with sleeves Aides handling shopping, instrumental ADLs Meals from [facility]  Current Outpatient Medications on File Prior to Visit  Medication Sig Dispense Refill  . acetaminophen (TYLENOL) 650 MG CR tablet Take 650 mg by mouth 3 (three) times daily.    Marland Kitchen aspirin 81 MG tablet Take 81 mg by mouth daily.    . celecoxib (CELEBREX) 200 MG capsule TAKE 1 CAPSULE DAILY 90 capsule 3  . [facility] VITAMIN D3 1000 UNITS capsule TAKE ONE CAPSULE BY MOUTH DAILY 30 capsule 0  . DETROL LA 4 MG 24 hr capsule TAKE 1 CAPSULE DAILY 90 capsule 3  . DiphenhydrAMINE HCl (ALLERGY MED PO) Take 1 tablet by mouth daily.    Marland Kitchen docusate sodium (STOOL SOFTENER) 100 MG capsule Take 100 mg by mouth daily as needed for mild constipation.    . enoxaparin (LOVENOX) 40 MG/0.4ML injection Inject 0.4 mLs (40 mg total) into the skin daily. 14 Syringe 0  . Lactobacillus (ACIDOPHILUS) 100 MG CAPS Take by mouth.    . levocetirizine (XYZAL) 5 MG tablet Take 5 mg by mouth every evening.    . metroNIDAZOLE (METROGEL) 0.75 % vaginal gel insert 1 applicatorful vaginally twice a day 70 g 5  . Multiple Vitamin (MULTIVITAMIN) tablet Take 1 tablet by mouth daily.    Marland Kitchen oxyCODONE (OXY IR/ROXICODONE) 5 MG immediate release tablet Take 1-2 tablets (5-10 mg total) every 4 (four) hours as needed by mouth for breakthrough pain. 30 tablet 0  . polyethylene glycol (MIRALAX / GLYCOLAX) packet Take 17 g by mouth daily as needed.    . Probiotic  Product (PROBIOTIC ADVANCED PO) Take 1 capsule by mouth daily.    . Simethicone (GAS-X PO) Take by mouth 2 (two) times daily.     No current facility-administered medications on file prior to visit.     Allergies  Allergen Reactions  . Demerol [Meperidine]     dizzy  . Shellfish Allergy     Past Medical History:  Diagnosis Date  . Allergic rhinitis due to pollen    some shellfish also  . Aortic sclerosis 5/12   On echo. no stenosis  . Arthritis   . Diastolic dysfunction    stress echo otherwise normal 1/04. Repeat 5/11 also negative  . History of seasonal allergies   . Hyperlipidemia   . Osteoarthritis, multiple sites   . Osteoporosis   . Overactive bladder   . Urge incontinence     Past Surgical History:  Procedure Laterality Date  . CYSTOCELE REPAIR  2007   and rectocele  . DILATION AND CURETTAGE OF UTERUS    . INGUINAL HERNIA REPAIR  2012   left side with mesh  . REVERSE SHOULDER ARTHROPLASTY Right 04/27/2017   Procedure: REVERSE SHOULDER ARTHROPLASTY;  Surgeon: Corky Mull, MD;  Location: [facility] ORS;  Service: Orthopedics;  Laterality: Right;  . TONSILLECTOMY AND ADENOIDECTOMY     as child  . TOTAL HIP ARTHROPLASTY Right 05/23/2015   Procedure: TOTAL HIP  ARTHROPLASTY ANTERIOR APPROACH;  Surgeon: Hessie Knows, MD;  Location: [facility] ORS;  Service: Orthopedics;  Laterality: Right;  . UMBILICAL HERNIA REPAIR  02/04/06    Family History  Problem Relation Age of Onset  . Cancer Father 46       colon cancer  . Heart disease Father     Social History   Socioeconomic History  . Marital status: Married    Spouse name: Not on file  . Number of children: 3  . Years of education: Not on file  . Highest education level: Not on file  Social Needs  . Financial resource strain: Not on file  . Food insecurity - worry: Not on file  . Food insecurity - inability: Not on file  . Transportation needs - medical: Not on file  . Transportation needs - non-medical: Not on  file  Occupational History  . Occupation: Pharmacist, hospital    Comment: short time  . Occupation: Herbalist    Comment: most of career  Tobacco Use  . Smoking status: Former Research scientist (life sciences)  . Smokeless tobacco: Never Used  Substance and Sexual Activity  . Alcohol use: Yes    Comment: wine before dinner  . Drug use: No  . Sexual activity: Not on file  Other Topics Concern  . Not on file  Social History Narrative   Has living will   Husband then daughter [name] [name], is health care POA   Would accept trial of resuscitation   Would probably accept feeding tube   Review of Systems  Appetite is okay Sleeping okay--occ awakened by pain     Objective:   Physical Exam  Constitutional: She appears well-developed. No distress.  Musculoskeletal:  No right shoulder swelling Limited internal rotation External rotation about half of normal Abduction to 45 degrees          Assessment & Plan:

## 2017-07-06 NOTE — Assessment & Plan Note (Signed)
Slow improvement Narcotics only occasionally at night Limited ROM  Still needs aides-- for her and also husband Probably reasonable to cut out the aides at night--but may need indefinitely due to husband's needs

## 2017-07-07 DIAGNOSIS — R4189 Other symptoms and signs involving cognitive functions and awareness: Secondary | ICD-10-CM | POA: Diagnosis not present

## 2017-07-07 DIAGNOSIS — M25611 Stiffness of right shoulder, not elsewhere classified: Secondary | ICD-10-CM | POA: Diagnosis not present

## 2017-07-07 DIAGNOSIS — M6281 Muscle weakness (generalized): Secondary | ICD-10-CM | POA: Diagnosis not present

## 2017-07-08 DIAGNOSIS — M25611 Stiffness of right shoulder, not elsewhere classified: Secondary | ICD-10-CM | POA: Diagnosis not present

## 2017-07-08 DIAGNOSIS — R4189 Other symptoms and signs involving cognitive functions and awareness: Secondary | ICD-10-CM | POA: Diagnosis not present

## 2017-07-08 DIAGNOSIS — M6281 Muscle weakness (generalized): Secondary | ICD-10-CM | POA: Diagnosis not present

## 2017-07-09 DIAGNOSIS — M25611 Stiffness of right shoulder, not elsewhere classified: Secondary | ICD-10-CM | POA: Diagnosis not present

## 2017-07-09 DIAGNOSIS — R4189 Other symptoms and signs involving cognitive functions and awareness: Secondary | ICD-10-CM | POA: Diagnosis not present

## 2017-07-09 DIAGNOSIS — M6281 Muscle weakness (generalized): Secondary | ICD-10-CM | POA: Diagnosis not present

## 2017-07-13 DIAGNOSIS — R4189 Other symptoms and signs involving cognitive functions and awareness: Secondary | ICD-10-CM | POA: Diagnosis not present

## 2017-07-13 DIAGNOSIS — M6281 Muscle weakness (generalized): Secondary | ICD-10-CM | POA: Diagnosis not present

## 2017-07-13 DIAGNOSIS — M25611 Stiffness of right shoulder, not elsewhere classified: Secondary | ICD-10-CM | POA: Diagnosis not present

## 2017-07-15 DIAGNOSIS — M25611 Stiffness of right shoulder, not elsewhere classified: Secondary | ICD-10-CM | POA: Diagnosis not present

## 2017-07-15 DIAGNOSIS — R4189 Other symptoms and signs involving cognitive functions and awareness: Secondary | ICD-10-CM | POA: Diagnosis not present

## 2017-07-15 DIAGNOSIS — M6281 Muscle weakness (generalized): Secondary | ICD-10-CM | POA: Diagnosis not present

## 2017-07-16 DIAGNOSIS — M25611 Stiffness of right shoulder, not elsewhere classified: Secondary | ICD-10-CM | POA: Diagnosis not present

## 2017-07-16 DIAGNOSIS — M6281 Muscle weakness (generalized): Secondary | ICD-10-CM | POA: Diagnosis not present

## 2017-07-16 DIAGNOSIS — R4189 Other symptoms and signs involving cognitive functions and awareness: Secondary | ICD-10-CM | POA: Diagnosis not present

## 2017-07-20 DIAGNOSIS — J3081 Allergic rhinitis due to animal (cat) (dog) hair and dander: Secondary | ICD-10-CM | POA: Diagnosis not present

## 2017-07-20 DIAGNOSIS — J301 Allergic rhinitis due to pollen: Secondary | ICD-10-CM | POA: Diagnosis not present

## 2017-07-20 DIAGNOSIS — J3089 Other allergic rhinitis: Secondary | ICD-10-CM | POA: Diagnosis not present

## 2017-07-22 DIAGNOSIS — J301 Allergic rhinitis due to pollen: Secondary | ICD-10-CM | POA: Diagnosis not present

## 2017-07-22 DIAGNOSIS — M6281 Muscle weakness (generalized): Secondary | ICD-10-CM | POA: Diagnosis not present

## 2017-07-22 DIAGNOSIS — J3081 Allergic rhinitis due to animal (cat) (dog) hair and dander: Secondary | ICD-10-CM | POA: Diagnosis not present

## 2017-07-22 DIAGNOSIS — R4189 Other symptoms and signs involving cognitive functions and awareness: Secondary | ICD-10-CM | POA: Diagnosis not present

## 2017-07-22 DIAGNOSIS — M25611 Stiffness of right shoulder, not elsewhere classified: Secondary | ICD-10-CM | POA: Diagnosis not present

## 2017-07-22 DIAGNOSIS — J3089 Other allergic rhinitis: Secondary | ICD-10-CM | POA: Diagnosis not present

## 2017-07-23 DIAGNOSIS — R4189 Other symptoms and signs involving cognitive functions and awareness: Secondary | ICD-10-CM | POA: Diagnosis not present

## 2017-07-23 DIAGNOSIS — M25611 Stiffness of right shoulder, not elsewhere classified: Secondary | ICD-10-CM | POA: Diagnosis not present

## 2017-07-23 DIAGNOSIS — M6281 Muscle weakness (generalized): Secondary | ICD-10-CM | POA: Diagnosis not present

## 2017-07-26 DIAGNOSIS — M6281 Muscle weakness (generalized): Secondary | ICD-10-CM | POA: Diagnosis not present

## 2017-07-26 DIAGNOSIS — R4189 Other symptoms and signs involving cognitive functions and awareness: Secondary | ICD-10-CM | POA: Diagnosis not present

## 2017-07-26 DIAGNOSIS — M25611 Stiffness of right shoulder, not elsewhere classified: Secondary | ICD-10-CM | POA: Diagnosis not present

## 2017-07-28 DIAGNOSIS — R4189 Other symptoms and signs involving cognitive functions and awareness: Secondary | ICD-10-CM | POA: Diagnosis not present

## 2017-07-28 DIAGNOSIS — M6281 Muscle weakness (generalized): Secondary | ICD-10-CM | POA: Diagnosis not present

## 2017-07-28 DIAGNOSIS — M25611 Stiffness of right shoulder, not elsewhere classified: Secondary | ICD-10-CM | POA: Diagnosis not present

## 2017-07-29 DIAGNOSIS — J301 Allergic rhinitis due to pollen: Secondary | ICD-10-CM | POA: Diagnosis not present

## 2017-07-29 DIAGNOSIS — J3081 Allergic rhinitis due to animal (cat) (dog) hair and dander: Secondary | ICD-10-CM | POA: Diagnosis not present

## 2017-07-29 DIAGNOSIS — J3089 Other allergic rhinitis: Secondary | ICD-10-CM | POA: Diagnosis not present

## 2017-07-30 DIAGNOSIS — Z96611 Presence of right artificial shoulder joint: Secondary | ICD-10-CM | POA: Diagnosis not present

## 2017-07-30 DIAGNOSIS — M25611 Stiffness of right shoulder, not elsewhere classified: Secondary | ICD-10-CM | POA: Diagnosis not present

## 2017-07-30 DIAGNOSIS — R4189 Other symptoms and signs involving cognitive functions and awareness: Secondary | ICD-10-CM | POA: Diagnosis not present

## 2017-07-30 DIAGNOSIS — M6281 Muscle weakness (generalized): Secondary | ICD-10-CM | POA: Diagnosis not present

## 2017-08-02 DIAGNOSIS — M6281 Muscle weakness (generalized): Secondary | ICD-10-CM | POA: Diagnosis not present

## 2017-08-02 DIAGNOSIS — M25611 Stiffness of right shoulder, not elsewhere classified: Secondary | ICD-10-CM | POA: Diagnosis not present

## 2017-08-02 DIAGNOSIS — R4189 Other symptoms and signs involving cognitive functions and awareness: Secondary | ICD-10-CM | POA: Diagnosis not present

## 2017-08-04 DIAGNOSIS — M6281 Muscle weakness (generalized): Secondary | ICD-10-CM | POA: Diagnosis not present

## 2017-08-04 DIAGNOSIS — M25611 Stiffness of right shoulder, not elsewhere classified: Secondary | ICD-10-CM | POA: Diagnosis not present

## 2017-08-05 DIAGNOSIS — J3081 Allergic rhinitis due to animal (cat) (dog) hair and dander: Secondary | ICD-10-CM | POA: Diagnosis not present

## 2017-08-05 DIAGNOSIS — J3089 Other allergic rhinitis: Secondary | ICD-10-CM | POA: Diagnosis not present

## 2017-08-05 DIAGNOSIS — J301 Allergic rhinitis due to pollen: Secondary | ICD-10-CM | POA: Diagnosis not present

## 2017-08-05 DIAGNOSIS — H401131 Primary open-angle glaucoma, bilateral, mild stage: Secondary | ICD-10-CM | POA: Diagnosis not present

## 2017-08-06 DIAGNOSIS — M6281 Muscle weakness (generalized): Secondary | ICD-10-CM | POA: Diagnosis not present

## 2017-08-06 DIAGNOSIS — M25611 Stiffness of right shoulder, not elsewhere classified: Secondary | ICD-10-CM | POA: Diagnosis not present

## 2017-08-10 DIAGNOSIS — M6281 Muscle weakness (generalized): Secondary | ICD-10-CM | POA: Diagnosis not present

## 2017-08-10 DIAGNOSIS — J301 Allergic rhinitis due to pollen: Secondary | ICD-10-CM | POA: Diagnosis not present

## 2017-08-10 DIAGNOSIS — J3081 Allergic rhinitis due to animal (cat) (dog) hair and dander: Secondary | ICD-10-CM | POA: Diagnosis not present

## 2017-08-10 DIAGNOSIS — J3089 Other allergic rhinitis: Secondary | ICD-10-CM | POA: Diagnosis not present

## 2017-08-10 DIAGNOSIS — M25611 Stiffness of right shoulder, not elsewhere classified: Secondary | ICD-10-CM | POA: Diagnosis not present

## 2017-08-11 DIAGNOSIS — M25611 Stiffness of right shoulder, not elsewhere classified: Secondary | ICD-10-CM | POA: Diagnosis not present

## 2017-08-11 DIAGNOSIS — M6281 Muscle weakness (generalized): Secondary | ICD-10-CM | POA: Diagnosis not present

## 2017-08-12 DIAGNOSIS — H401132 Primary open-angle glaucoma, bilateral, moderate stage: Secondary | ICD-10-CM | POA: Diagnosis not present

## 2017-08-13 DIAGNOSIS — M6281 Muscle weakness (generalized): Secondary | ICD-10-CM | POA: Diagnosis not present

## 2017-08-13 DIAGNOSIS — M25611 Stiffness of right shoulder, not elsewhere classified: Secondary | ICD-10-CM | POA: Diagnosis not present

## 2017-08-16 DIAGNOSIS — M25611 Stiffness of right shoulder, not elsewhere classified: Secondary | ICD-10-CM | POA: Diagnosis not present

## 2017-08-16 DIAGNOSIS — M6281 Muscle weakness (generalized): Secondary | ICD-10-CM | POA: Diagnosis not present

## 2017-08-17 DIAGNOSIS — J3081 Allergic rhinitis due to animal (cat) (dog) hair and dander: Secondary | ICD-10-CM | POA: Diagnosis not present

## 2017-08-17 DIAGNOSIS — J3089 Other allergic rhinitis: Secondary | ICD-10-CM | POA: Diagnosis not present

## 2017-08-17 DIAGNOSIS — J301 Allergic rhinitis due to pollen: Secondary | ICD-10-CM | POA: Diagnosis not present

## 2017-08-18 DIAGNOSIS — M25611 Stiffness of right shoulder, not elsewhere classified: Secondary | ICD-10-CM | POA: Diagnosis not present

## 2017-08-18 DIAGNOSIS — M6281 Muscle weakness (generalized): Secondary | ICD-10-CM | POA: Diagnosis not present

## 2017-08-20 DIAGNOSIS — M6281 Muscle weakness (generalized): Secondary | ICD-10-CM | POA: Diagnosis not present

## 2017-08-20 DIAGNOSIS — M25611 Stiffness of right shoulder, not elsewhere classified: Secondary | ICD-10-CM | POA: Diagnosis not present

## 2017-08-24 DIAGNOSIS — M25611 Stiffness of right shoulder, not elsewhere classified: Secondary | ICD-10-CM | POA: Diagnosis not present

## 2017-08-24 DIAGNOSIS — M6281 Muscle weakness (generalized): Secondary | ICD-10-CM | POA: Diagnosis not present

## 2017-08-25 DIAGNOSIS — J3089 Other allergic rhinitis: Secondary | ICD-10-CM | POA: Diagnosis not present

## 2017-08-25 DIAGNOSIS — J3081 Allergic rhinitis due to animal (cat) (dog) hair and dander: Secondary | ICD-10-CM | POA: Diagnosis not present

## 2017-08-26 DIAGNOSIS — J3089 Other allergic rhinitis: Secondary | ICD-10-CM | POA: Diagnosis not present

## 2017-08-26 DIAGNOSIS — J3081 Allergic rhinitis due to animal (cat) (dog) hair and dander: Secondary | ICD-10-CM | POA: Diagnosis not present

## 2017-08-26 DIAGNOSIS — J301 Allergic rhinitis due to pollen: Secondary | ICD-10-CM | POA: Diagnosis not present

## 2017-08-26 DIAGNOSIS — M6281 Muscle weakness (generalized): Secondary | ICD-10-CM | POA: Diagnosis not present

## 2017-08-26 DIAGNOSIS — M25611 Stiffness of right shoulder, not elsewhere classified: Secondary | ICD-10-CM | POA: Diagnosis not present

## 2017-08-27 DIAGNOSIS — M6281 Muscle weakness (generalized): Secondary | ICD-10-CM | POA: Diagnosis not present

## 2017-08-27 DIAGNOSIS — M25611 Stiffness of right shoulder, not elsewhere classified: Secondary | ICD-10-CM | POA: Diagnosis not present

## 2017-08-30 ENCOUNTER — Telehealth: Payer: Self-pay | Admitting: Internal Medicine

## 2017-08-30 DIAGNOSIS — M25611 Stiffness of right shoulder, not elsewhere classified: Secondary | ICD-10-CM | POA: Diagnosis not present

## 2017-08-30 DIAGNOSIS — M6281 Muscle weakness (generalized): Secondary | ICD-10-CM | POA: Diagnosis not present

## 2017-08-30 NOTE — Telephone Encounter (Signed)
She may want to try meditation or some way to distract her thoughts (so she is not thinking about him). She can try melatonin 1-2 mg at bedtime as well--to see if that helps

## 2017-08-30 NOTE — Telephone Encounter (Signed)
Copied from [place] [id number]. Topic: Quick Communication - See Telephone Encounter >> Aug 30, 2017  1:37 PM Bea Graff, NT wrote: CRM for notification. See Telephone encounter for: Pt is having trouble sleeping and daughter called to see what Dr. Silvio Pate recommends for her to do.  08/30/17.

## 2017-08-30 NOTE — Telephone Encounter (Signed)
Spoke to pt's daughter, [name]. She is already taking melatonin but she is unsure how much. She will find out and call us back.

## 2017-08-30 NOTE — Telephone Encounter (Signed)
pts daughter said pts goes to sleep with no problem but wakes up after couple hours and then cannot go back to sleep. Pt is in independent living and pts husband is resident at [facility] [name] said her mom does not mind staying by herself but thinks that when pt wakes up she starts thinking about her husband and then cannot go back to sleep. [facility].Please advise.[name] request cb.

## 2017-08-31 DIAGNOSIS — J301 Allergic rhinitis due to pollen: Secondary | ICD-10-CM | POA: Diagnosis not present

## 2017-08-31 DIAGNOSIS — J3089 Other allergic rhinitis: Secondary | ICD-10-CM | POA: Diagnosis not present

## 2017-08-31 DIAGNOSIS — J3081 Allergic rhinitis due to animal (cat) (dog) hair and dander: Secondary | ICD-10-CM | POA: Diagnosis not present

## 2017-08-31 NOTE — Telephone Encounter (Signed)
If no side effects, she can try increasing to as much as 5mg  nightly---I wouldn't recommend more than that

## 2017-08-31 NOTE — Telephone Encounter (Signed)
Spoke to [employer]

## 2017-09-02 DIAGNOSIS — M6281 Muscle weakness (generalized): Secondary | ICD-10-CM | POA: Diagnosis not present

## 2017-09-02 DIAGNOSIS — M25611 Stiffness of right shoulder, not elsewhere classified: Secondary | ICD-10-CM | POA: Diagnosis not present

## 2017-09-03 DIAGNOSIS — M25611 Stiffness of right shoulder, not elsewhere classified: Secondary | ICD-10-CM | POA: Diagnosis not present

## 2017-09-03 DIAGNOSIS — M6281 Muscle weakness (generalized): Secondary | ICD-10-CM | POA: Diagnosis not present

## 2017-09-07 DIAGNOSIS — M6281 Muscle weakness (generalized): Secondary | ICD-10-CM | POA: Diagnosis not present

## 2017-09-07 DIAGNOSIS — J301 Allergic rhinitis due to pollen: Secondary | ICD-10-CM | POA: Diagnosis not present

## 2017-09-07 DIAGNOSIS — J3089 Other allergic rhinitis: Secondary | ICD-10-CM | POA: Diagnosis not present

## 2017-09-07 DIAGNOSIS — J3081 Allergic rhinitis due to animal (cat) (dog) hair and dander: Secondary | ICD-10-CM | POA: Diagnosis not present

## 2017-09-07 DIAGNOSIS — M25611 Stiffness of right shoulder, not elsewhere classified: Secondary | ICD-10-CM | POA: Diagnosis not present

## 2017-09-09 DIAGNOSIS — M25611 Stiffness of right shoulder, not elsewhere classified: Secondary | ICD-10-CM | POA: Diagnosis not present

## 2017-09-09 DIAGNOSIS — H401132 Primary open-angle glaucoma, bilateral, moderate stage: Secondary | ICD-10-CM | POA: Diagnosis not present

## 2017-09-09 DIAGNOSIS — M6281 Muscle weakness (generalized): Secondary | ICD-10-CM | POA: Diagnosis not present

## 2017-09-10 DIAGNOSIS — M6281 Muscle weakness (generalized): Secondary | ICD-10-CM | POA: Diagnosis not present

## 2017-09-10 DIAGNOSIS — M25611 Stiffness of right shoulder, not elsewhere classified: Secondary | ICD-10-CM | POA: Diagnosis not present

## 2017-09-14 DIAGNOSIS — M6281 Muscle weakness (generalized): Secondary | ICD-10-CM | POA: Diagnosis not present

## 2017-09-14 DIAGNOSIS — J301 Allergic rhinitis due to pollen: Secondary | ICD-10-CM | POA: Diagnosis not present

## 2017-09-14 DIAGNOSIS — M25611 Stiffness of right shoulder, not elsewhere classified: Secondary | ICD-10-CM | POA: Diagnosis not present

## 2017-09-14 DIAGNOSIS — J3081 Allergic rhinitis due to animal (cat) (dog) hair and dander: Secondary | ICD-10-CM | POA: Diagnosis not present

## 2017-09-14 DIAGNOSIS — J3089 Other allergic rhinitis: Secondary | ICD-10-CM | POA: Diagnosis not present

## 2017-09-16 DIAGNOSIS — M6281 Muscle weakness (generalized): Secondary | ICD-10-CM | POA: Diagnosis not present

## 2017-09-16 DIAGNOSIS — M25611 Stiffness of right shoulder, not elsewhere classified: Secondary | ICD-10-CM | POA: Diagnosis not present

## 2017-09-17 DIAGNOSIS — M6281 Muscle weakness (generalized): Secondary | ICD-10-CM | POA: Diagnosis not present

## 2017-09-17 DIAGNOSIS — M25611 Stiffness of right shoulder, not elsewhere classified: Secondary | ICD-10-CM | POA: Diagnosis not present

## 2017-09-21 DIAGNOSIS — J301 Allergic rhinitis due to pollen: Secondary | ICD-10-CM | POA: Diagnosis not present

## 2017-09-21 DIAGNOSIS — J3089 Other allergic rhinitis: Secondary | ICD-10-CM | POA: Diagnosis not present

## 2017-09-21 DIAGNOSIS — M25611 Stiffness of right shoulder, not elsewhere classified: Secondary | ICD-10-CM | POA: Diagnosis not present

## 2017-09-21 DIAGNOSIS — J3081 Allergic rhinitis due to animal (cat) (dog) hair and dander: Secondary | ICD-10-CM | POA: Diagnosis not present

## 2017-09-21 DIAGNOSIS — M6281 Muscle weakness (generalized): Secondary | ICD-10-CM | POA: Diagnosis not present

## 2017-09-22 DIAGNOSIS — M25611 Stiffness of right shoulder, not elsewhere classified: Secondary | ICD-10-CM | POA: Diagnosis not present

## 2017-09-22 DIAGNOSIS — M6281 Muscle weakness (generalized): Secondary | ICD-10-CM | POA: Diagnosis not present

## 2017-09-24 DIAGNOSIS — M6281 Muscle weakness (generalized): Secondary | ICD-10-CM | POA: Diagnosis not present

## 2017-09-24 DIAGNOSIS — M25611 Stiffness of right shoulder, not elsewhere classified: Secondary | ICD-10-CM | POA: Diagnosis not present

## 2017-09-28 DIAGNOSIS — J3081 Allergic rhinitis due to animal (cat) (dog) hair and dander: Secondary | ICD-10-CM | POA: Diagnosis not present

## 2017-09-28 DIAGNOSIS — J301 Allergic rhinitis due to pollen: Secondary | ICD-10-CM | POA: Diagnosis not present

## 2017-09-28 DIAGNOSIS — M6281 Muscle weakness (generalized): Secondary | ICD-10-CM | POA: Diagnosis not present

## 2017-09-28 DIAGNOSIS — J3089 Other allergic rhinitis: Secondary | ICD-10-CM | POA: Diagnosis not present

## 2017-09-28 DIAGNOSIS — M25611 Stiffness of right shoulder, not elsewhere classified: Secondary | ICD-10-CM | POA: Diagnosis not present

## 2017-09-30 DIAGNOSIS — M6281 Muscle weakness (generalized): Secondary | ICD-10-CM | POA: Diagnosis not present

## 2017-09-30 DIAGNOSIS — M25611 Stiffness of right shoulder, not elsewhere classified: Secondary | ICD-10-CM | POA: Diagnosis not present

## 2017-10-05 DIAGNOSIS — J3081 Allergic rhinitis due to animal (cat) (dog) hair and dander: Secondary | ICD-10-CM | POA: Diagnosis not present

## 2017-10-05 DIAGNOSIS — J3089 Other allergic rhinitis: Secondary | ICD-10-CM | POA: Diagnosis not present

## 2017-10-05 DIAGNOSIS — J301 Allergic rhinitis due to pollen: Secondary | ICD-10-CM | POA: Diagnosis not present

## 2017-10-08 DIAGNOSIS — Z96611 Presence of right artificial shoulder joint: Secondary | ICD-10-CM | POA: Diagnosis not present

## 2017-10-11 DIAGNOSIS — M25611 Stiffness of right shoulder, not elsewhere classified: Secondary | ICD-10-CM | POA: Diagnosis not present

## 2017-10-11 DIAGNOSIS — M6281 Muscle weakness (generalized): Secondary | ICD-10-CM | POA: Diagnosis not present

## 2017-10-12 DIAGNOSIS — J3081 Allergic rhinitis due to animal (cat) (dog) hair and dander: Secondary | ICD-10-CM | POA: Diagnosis not present

## 2017-10-12 DIAGNOSIS — J301 Allergic rhinitis due to pollen: Secondary | ICD-10-CM | POA: Diagnosis not present

## 2017-10-12 DIAGNOSIS — J3089 Other allergic rhinitis: Secondary | ICD-10-CM | POA: Diagnosis not present

## 2017-10-13 DIAGNOSIS — M6281 Muscle weakness (generalized): Secondary | ICD-10-CM | POA: Diagnosis not present

## 2017-10-13 DIAGNOSIS — M25611 Stiffness of right shoulder, not elsewhere classified: Secondary | ICD-10-CM | POA: Diagnosis not present

## 2017-10-14 DIAGNOSIS — M25611 Stiffness of right shoulder, not elsewhere classified: Secondary | ICD-10-CM | POA: Diagnosis not present

## 2017-10-14 DIAGNOSIS — M6281 Muscle weakness (generalized): Secondary | ICD-10-CM | POA: Diagnosis not present

## 2017-10-18 DIAGNOSIS — M25611 Stiffness of right shoulder, not elsewhere classified: Secondary | ICD-10-CM | POA: Diagnosis not present

## 2017-10-18 DIAGNOSIS — M6281 Muscle weakness (generalized): Secondary | ICD-10-CM | POA: Diagnosis not present

## 2017-10-19 DIAGNOSIS — M6281 Muscle weakness (generalized): Secondary | ICD-10-CM | POA: Diagnosis not present

## 2017-10-19 DIAGNOSIS — M25611 Stiffness of right shoulder, not elsewhere classified: Secondary | ICD-10-CM | POA: Diagnosis not present

## 2017-10-21 DIAGNOSIS — J3089 Other allergic rhinitis: Secondary | ICD-10-CM | POA: Diagnosis not present

## 2017-10-21 DIAGNOSIS — J301 Allergic rhinitis due to pollen: Secondary | ICD-10-CM | POA: Diagnosis not present

## 2017-10-21 DIAGNOSIS — J3081 Allergic rhinitis due to animal (cat) (dog) hair and dander: Secondary | ICD-10-CM | POA: Diagnosis not present

## 2017-10-21 DIAGNOSIS — M25611 Stiffness of right shoulder, not elsewhere classified: Secondary | ICD-10-CM | POA: Diagnosis not present

## 2017-10-21 DIAGNOSIS — M6281 Muscle weakness (generalized): Secondary | ICD-10-CM | POA: Diagnosis not present

## 2017-10-22 DIAGNOSIS — M6281 Muscle weakness (generalized): Secondary | ICD-10-CM | POA: Diagnosis not present

## 2017-10-22 DIAGNOSIS — M25611 Stiffness of right shoulder, not elsewhere classified: Secondary | ICD-10-CM | POA: Diagnosis not present

## 2017-10-26 DIAGNOSIS — M6281 Muscle weakness (generalized): Secondary | ICD-10-CM | POA: Diagnosis not present

## 2017-10-26 DIAGNOSIS — J3089 Other allergic rhinitis: Secondary | ICD-10-CM | POA: Diagnosis not present

## 2017-10-26 DIAGNOSIS — J301 Allergic rhinitis due to pollen: Secondary | ICD-10-CM | POA: Diagnosis not present

## 2017-10-26 DIAGNOSIS — M25611 Stiffness of right shoulder, not elsewhere classified: Secondary | ICD-10-CM | POA: Diagnosis not present

## 2017-10-26 DIAGNOSIS — J3081 Allergic rhinitis due to animal (cat) (dog) hair and dander: Secondary | ICD-10-CM | POA: Diagnosis not present

## 2017-10-28 DIAGNOSIS — M6281 Muscle weakness (generalized): Secondary | ICD-10-CM | POA: Diagnosis not present

## 2017-10-28 DIAGNOSIS — M25611 Stiffness of right shoulder, not elsewhere classified: Secondary | ICD-10-CM | POA: Diagnosis not present

## 2017-10-29 DIAGNOSIS — M25611 Stiffness of right shoulder, not elsewhere classified: Secondary | ICD-10-CM | POA: Diagnosis not present

## 2017-10-29 DIAGNOSIS — M6281 Muscle weakness (generalized): Secondary | ICD-10-CM | POA: Diagnosis not present

## 2017-11-02 DIAGNOSIS — J301 Allergic rhinitis due to pollen: Secondary | ICD-10-CM | POA: Diagnosis not present

## 2017-11-02 DIAGNOSIS — J3081 Allergic rhinitis due to animal (cat) (dog) hair and dander: Secondary | ICD-10-CM | POA: Diagnosis not present

## 2017-11-02 DIAGNOSIS — M25611 Stiffness of right shoulder, not elsewhere classified: Secondary | ICD-10-CM | POA: Diagnosis not present

## 2017-11-02 DIAGNOSIS — J3089 Other allergic rhinitis: Secondary | ICD-10-CM | POA: Diagnosis not present

## 2017-11-02 DIAGNOSIS — M6281 Muscle weakness (generalized): Secondary | ICD-10-CM | POA: Diagnosis not present

## 2017-11-04 DIAGNOSIS — M6281 Muscle weakness (generalized): Secondary | ICD-10-CM | POA: Diagnosis not present

## 2017-11-04 DIAGNOSIS — M25611 Stiffness of right shoulder, not elsewhere classified: Secondary | ICD-10-CM | POA: Diagnosis not present

## 2017-11-08 DIAGNOSIS — J301 Allergic rhinitis due to pollen: Secondary | ICD-10-CM | POA: Diagnosis not present

## 2017-11-09 DIAGNOSIS — J3089 Other allergic rhinitis: Secondary | ICD-10-CM | POA: Diagnosis not present

## 2017-11-09 DIAGNOSIS — J3081 Allergic rhinitis due to animal (cat) (dog) hair and dander: Secondary | ICD-10-CM | POA: Diagnosis not present

## 2017-11-09 DIAGNOSIS — J301 Allergic rhinitis due to pollen: Secondary | ICD-10-CM | POA: Diagnosis not present

## 2017-11-16 DIAGNOSIS — J3081 Allergic rhinitis due to animal (cat) (dog) hair and dander: Secondary | ICD-10-CM | POA: Diagnosis not present

## 2017-11-16 DIAGNOSIS — J301 Allergic rhinitis due to pollen: Secondary | ICD-10-CM | POA: Diagnosis not present

## 2017-11-16 DIAGNOSIS — J3089 Other allergic rhinitis: Secondary | ICD-10-CM | POA: Diagnosis not present

## 2017-11-25 DIAGNOSIS — J301 Allergic rhinitis due to pollen: Secondary | ICD-10-CM | POA: Diagnosis not present

## 2017-11-25 DIAGNOSIS — J3089 Other allergic rhinitis: Secondary | ICD-10-CM | POA: Diagnosis not present

## 2017-11-25 DIAGNOSIS — J3081 Allergic rhinitis due to animal (cat) (dog) hair and dander: Secondary | ICD-10-CM | POA: Diagnosis not present

## 2017-11-30 DIAGNOSIS — J301 Allergic rhinitis due to pollen: Secondary | ICD-10-CM | POA: Diagnosis not present

## 2017-11-30 DIAGNOSIS — J3089 Other allergic rhinitis: Secondary | ICD-10-CM | POA: Diagnosis not present

## 2017-11-30 DIAGNOSIS — J3081 Allergic rhinitis due to animal (cat) (dog) hair and dander: Secondary | ICD-10-CM | POA: Diagnosis not present

## 2017-12-07 DIAGNOSIS — J301 Allergic rhinitis due to pollen: Secondary | ICD-10-CM | POA: Diagnosis not present

## 2017-12-07 DIAGNOSIS — J3081 Allergic rhinitis due to animal (cat) (dog) hair and dander: Secondary | ICD-10-CM | POA: Diagnosis not present

## 2017-12-07 DIAGNOSIS — J3089 Other allergic rhinitis: Secondary | ICD-10-CM | POA: Diagnosis not present

## 2017-12-07 DIAGNOSIS — H1045 Other chronic allergic conjunctivitis: Secondary | ICD-10-CM | POA: Diagnosis not present

## 2017-12-14 DIAGNOSIS — J301 Allergic rhinitis due to pollen: Secondary | ICD-10-CM | POA: Diagnosis not present

## 2017-12-14 DIAGNOSIS — J3089 Other allergic rhinitis: Secondary | ICD-10-CM | POA: Diagnosis not present

## 2017-12-14 DIAGNOSIS — J3081 Allergic rhinitis due to animal (cat) (dog) hair and dander: Secondary | ICD-10-CM | POA: Diagnosis not present

## 2017-12-21 DIAGNOSIS — J301 Allergic rhinitis due to pollen: Secondary | ICD-10-CM | POA: Diagnosis not present

## 2017-12-21 DIAGNOSIS — J3081 Allergic rhinitis due to animal (cat) (dog) hair and dander: Secondary | ICD-10-CM | POA: Diagnosis not present

## 2017-12-21 DIAGNOSIS — J3089 Other allergic rhinitis: Secondary | ICD-10-CM | POA: Diagnosis not present

## 2017-12-28 DIAGNOSIS — J3089 Other allergic rhinitis: Secondary | ICD-10-CM | POA: Diagnosis not present

## 2017-12-28 DIAGNOSIS — J301 Allergic rhinitis due to pollen: Secondary | ICD-10-CM | POA: Diagnosis not present

## 2017-12-28 DIAGNOSIS — J3081 Allergic rhinitis due to animal (cat) (dog) hair and dander: Secondary | ICD-10-CM | POA: Diagnosis not present

## 2018-01-04 DIAGNOSIS — J3081 Allergic rhinitis due to animal (cat) (dog) hair and dander: Secondary | ICD-10-CM | POA: Diagnosis not present

## 2018-01-04 DIAGNOSIS — J3089 Other allergic rhinitis: Secondary | ICD-10-CM | POA: Diagnosis not present

## 2018-01-04 DIAGNOSIS — J301 Allergic rhinitis due to pollen: Secondary | ICD-10-CM | POA: Diagnosis not present

## 2018-01-13 DIAGNOSIS — J301 Allergic rhinitis due to pollen: Secondary | ICD-10-CM | POA: Diagnosis not present

## 2018-01-13 DIAGNOSIS — J3089 Other allergic rhinitis: Secondary | ICD-10-CM | POA: Diagnosis not present

## 2018-01-13 DIAGNOSIS — J3081 Allergic rhinitis due to animal (cat) (dog) hair and dander: Secondary | ICD-10-CM | POA: Diagnosis not present

## 2018-01-18 DIAGNOSIS — J3081 Allergic rhinitis due to animal (cat) (dog) hair and dander: Secondary | ICD-10-CM | POA: Diagnosis not present

## 2018-01-18 DIAGNOSIS — J301 Allergic rhinitis due to pollen: Secondary | ICD-10-CM | POA: Diagnosis not present

## 2018-01-18 DIAGNOSIS — J3089 Other allergic rhinitis: Secondary | ICD-10-CM | POA: Diagnosis not present

## 2018-01-25 DIAGNOSIS — J3081 Allergic rhinitis due to animal (cat) (dog) hair and dander: Secondary | ICD-10-CM | POA: Diagnosis not present

## 2018-01-25 DIAGNOSIS — J301 Allergic rhinitis due to pollen: Secondary | ICD-10-CM | POA: Diagnosis not present

## 2018-01-25 DIAGNOSIS — J3089 Other allergic rhinitis: Secondary | ICD-10-CM | POA: Diagnosis not present

## 2018-01-28 DIAGNOSIS — J3089 Other allergic rhinitis: Secondary | ICD-10-CM | POA: Diagnosis not present

## 2018-01-28 DIAGNOSIS — J3081 Allergic rhinitis due to animal (cat) (dog) hair and dander: Secondary | ICD-10-CM | POA: Diagnosis not present

## 2018-02-01 DIAGNOSIS — J3081 Allergic rhinitis due to animal (cat) (dog) hair and dander: Secondary | ICD-10-CM | POA: Diagnosis not present

## 2018-02-01 DIAGNOSIS — J3089 Other allergic rhinitis: Secondary | ICD-10-CM | POA: Diagnosis not present

## 2018-02-01 DIAGNOSIS — J301 Allergic rhinitis due to pollen: Secondary | ICD-10-CM | POA: Diagnosis not present

## 2018-02-02 ENCOUNTER — Encounter: Payer: Self-pay | Admitting: Internal Medicine

## 2018-02-02 ENCOUNTER — Ambulatory Visit (INDEPENDENT_AMBULATORY_CARE_PROVIDER_SITE_OTHER): Payer: Medicare Other | Admitting: Internal Medicine

## 2018-02-02 VITALS — BP 110/70 | HR 70 | Temp 97.9°F | Ht 63.0 in | Wt 137.0 lb

## 2018-02-02 DIAGNOSIS — R1032 Left lower quadrant pain: Secondary | ICD-10-CM | POA: Diagnosis not present

## 2018-02-02 DIAGNOSIS — N760 Acute vaginitis: Secondary | ICD-10-CM | POA: Diagnosis not present

## 2018-02-02 DIAGNOSIS — B9689 Other specified bacterial agents as the cause of diseases classified elsewhere: Secondary | ICD-10-CM

## 2018-02-02 MED ORDER — METRONIDAZOLE 500 MG PO TABS: 500.0000 mg | ORAL_TABLET | Freq: Two times a day (BID) | ORAL | 3 refills | Status: DC

## 2018-02-02 NOTE — Assessment & Plan Note (Signed)
Occasional mild symptoms Had severe spell around 2 weeks ago Will need to consider CT scan if recurs

## 2018-02-02 NOTE — Assessment & Plan Note (Signed)
Pretty classic symptoms that responded to topical metronidazole---but causing pain using the applicator Will try oral Rx with refills prn

## 2018-02-02 NOTE — Progress Notes (Signed)
Subjective:    Patient ID: [name] [name], female    DOB: 03/29/1924, 84 y.o.   MRN: [id number]  HPI Here for vaginal discharge  On and off for a year or so More burdensome in past year Has metronidazole gel in past---which helped. Stopped because of pain using the applicator  No blood Tan thick discharge (watery at times) No vaginal products   Current Outpatient Medications on File Prior to Visit  Medication Sig Dispense Refill  . acetaminophen (TYLENOL) 650 MG CR tablet Take 650 mg by mouth 3 (three) times daily.    Marland Kitchen aspirin 81 MG tablet Take 81 mg by mouth daily.    . celecoxib (CELEBREX) 200 MG capsule TAKE 1 CAPSULE DAILY 90 capsule 3  . [facility]S VITAMIN D3 1000 UNITS capsule TAKE ONE CAPSULE BY MOUTH DAILY 30 capsule 0  . DETROL LA 4 MG 24 hr capsule TAKE 1 CAPSULE DAILY 90 capsule 3  . DiphenhydrAMINE HCl (ALLERGY MED PO) Take 1 tablet by mouth daily.    Marland Kitchen docusate sodium (STOOL SOFTENER) 100 MG capsule Take 100 mg by mouth daily as needed for mild constipation.    . Lactobacillus (ACIDOPHILUS) 100 MG CAPS Take by mouth.    . levocetirizine (XYZAL) 5 MG tablet Take 5 mg by mouth every evening.    . Multiple Vitamin (MULTIVITAMIN) tablet Take 1 tablet by mouth daily.    . polyethylene glycol (MIRALAX / GLYCOLAX) packet Take 17 g by mouth daily as needed.    . Probiotic Product (PROBIOTIC ADVANCED PO) Take 1 capsule by mouth daily.    . Simethicone (GAS-X PO) Take by mouth 2 (two) times daily.     No current facility-administered medications on file prior to visit.     Allergies  Allergen Reactions  . Demerol [Meperidine]     dizzy  . Shellfish Allergy     Past Medical History:  Diagnosis Date  . Allergic rhinitis due to pollen    some shellfish also  . Aortic sclerosis 5/12   On echo. no stenosis  . Arthritis   . Diastolic dysfunction    stress echo otherwise normal 1/04. Repeat 5/11 also negative  . History of seasonal allergies   . Hyperlipidemia   .  Osteoarthritis, multiple sites   . Osteoporosis   . Overactive bladder   . Urge incontinence     Past Surgical History:  Procedure Laterality Date  . CYSTOCELE REPAIR  2007   and rectocele  . DILATION AND CURETTAGE OF UTERUS    . INGUINAL HERNIA REPAIR  2012   left side with mesh  . REVERSE SHOULDER ARTHROPLASTY Right 04/27/2017   Procedure: REVERSE SHOULDER ARTHROPLASTY;  Surgeon: Corky Mull, MD;  Location: [facility] ORS;  Service: Orthopedics;  Laterality: Right;  . TONSILLECTOMY AND ADENOIDECTOMY     as child  . TOTAL HIP ARTHROPLASTY Right 05/23/2015   Procedure: TOTAL HIP ARTHROPLASTY ANTERIOR APPROACH;  Surgeon: Hessie Knows, MD;  Location: [facility] ORS;  Service: Orthopedics;  Laterality: Right;  . UMBILICAL HERNIA REPAIR  02/04/06    Family History  Problem Relation Age of Onset  . Cancer Father 5       colon cancer  . Heart disease Father     Social History   Socioeconomic History  . Marital status: Divorced    Spouse name: Not on file  . Number of children: 3  . Years of education: Not on file  . Highest education level: Not on file  Occupational History  . Occupation: Pharmacist, hospital    Comment: short time  . Occupation: Herbalist    Comment: most of career  Social Needs  . Financial resource strain: Not on file  . Food insecurity:    Worry: Not on file    Inability: Not on file  . Transportation needs:    Medical: Not on file    Non-medical: Not on file  Tobacco Use  . Smoking status: Former Research scientist (life sciences)  . Smokeless tobacco: Never Used  Substance and Sexual Activity  . Alcohol use: Yes    Comment: wine before dinner  . Drug use: No  . Sexual activity: Not on file  Lifestyle  . Physical activity:    Days per week: Not on file    Minutes per session: Not on file  . Stress: Not on file  Relationships  . Social connections:    Talks on phone: Not on file    Gets together: Not on file    Attends religious service: Not on file    Active member of club or  organization: Not on file    Attends meetings of clubs or organizations: Not on file    Relationship status: Not on file  . Intimate partner violence:    Fear of current or ex partner: Not on file    Emotionally abused: Not on file    Physically abused: Not on file    Forced sexual activity: Not on file  Other Topics Concern  . Not on file  Social History Narrative   Widowed 2019   Has living will   Daughter [name], is health care POA   Would accept trial of resuscitation   Would probably accept feeding tube   Review of Systems Not sick No fever Did have spell of bad LLQ tenderness a few weeks ago---took left over oxycodone and it helped    Objective:   Physical Exam  Constitutional: She appears well-developed. No distress.  GI: Soft. She exhibits no distension. There is no tenderness. There is no rebound and no guarding.  No apparent inguinal or femoral hernia           Assessment & Plan:

## 2018-02-08 DIAGNOSIS — J3081 Allergic rhinitis due to animal (cat) (dog) hair and dander: Secondary | ICD-10-CM | POA: Diagnosis not present

## 2018-02-08 DIAGNOSIS — H401132 Primary open-angle glaucoma, bilateral, moderate stage: Secondary | ICD-10-CM | POA: Diagnosis not present

## 2018-02-08 DIAGNOSIS — J301 Allergic rhinitis due to pollen: Secondary | ICD-10-CM | POA: Diagnosis not present

## 2018-02-08 DIAGNOSIS — J3089 Other allergic rhinitis: Secondary | ICD-10-CM | POA: Diagnosis not present

## 2018-02-15 DIAGNOSIS — J3081 Allergic rhinitis due to animal (cat) (dog) hair and dander: Secondary | ICD-10-CM | POA: Diagnosis not present

## 2018-02-15 DIAGNOSIS — J3089 Other allergic rhinitis: Secondary | ICD-10-CM | POA: Diagnosis not present

## 2018-02-15 DIAGNOSIS — J301 Allergic rhinitis due to pollen: Secondary | ICD-10-CM | POA: Diagnosis not present

## 2018-02-22 DIAGNOSIS — J3081 Allergic rhinitis due to animal (cat) (dog) hair and dander: Secondary | ICD-10-CM | POA: Diagnosis not present

## 2018-02-22 DIAGNOSIS — J3089 Other allergic rhinitis: Secondary | ICD-10-CM | POA: Diagnosis not present

## 2018-02-22 DIAGNOSIS — J301 Allergic rhinitis due to pollen: Secondary | ICD-10-CM | POA: Diagnosis not present

## 2018-03-01 DIAGNOSIS — J3081 Allergic rhinitis due to animal (cat) (dog) hair and dander: Secondary | ICD-10-CM | POA: Diagnosis not present

## 2018-03-01 DIAGNOSIS — J3089 Other allergic rhinitis: Secondary | ICD-10-CM | POA: Diagnosis not present

## 2018-03-01 DIAGNOSIS — J301 Allergic rhinitis due to pollen: Secondary | ICD-10-CM | POA: Diagnosis not present

## 2018-03-07 ENCOUNTER — Ambulatory Visit (INDEPENDENT_AMBULATORY_CARE_PROVIDER_SITE_OTHER): Payer: Medicare Other | Admitting: Internal Medicine

## 2018-03-07 ENCOUNTER — Encounter: Payer: Self-pay | Admitting: Internal Medicine

## 2018-03-07 VITALS — BP 140/76 | HR 44 | Temp 98.0°F | Ht 63.25 in | Wt 137.0 lb

## 2018-03-07 DIAGNOSIS — N3941 Urge incontinence: Secondary | ICD-10-CM

## 2018-03-07 DIAGNOSIS — Z0000 Encounter for general adult medical examination without abnormal findings: Secondary | ICD-10-CM

## 2018-03-07 DIAGNOSIS — Z23 Encounter for immunization: Secondary | ICD-10-CM

## 2018-03-07 DIAGNOSIS — B9689 Other specified bacterial agents as the cause of diseases classified elsewhere: Secondary | ICD-10-CM | POA: Diagnosis not present

## 2018-03-07 DIAGNOSIS — J301 Allergic rhinitis due to pollen: Secondary | ICD-10-CM | POA: Diagnosis not present

## 2018-03-07 DIAGNOSIS — Z7189 Other specified counseling: Secondary | ICD-10-CM | POA: Diagnosis not present

## 2018-03-07 DIAGNOSIS — I5189 Other ill-defined heart diseases: Secondary | ICD-10-CM

## 2018-03-07 DIAGNOSIS — N760 Acute vaginitis: Secondary | ICD-10-CM | POA: Diagnosis not present

## 2018-03-07 NOTE — Assessment & Plan Note (Signed)
Try loratadine instead of benedryl

## 2018-03-07 NOTE — Addendum Note (Signed)
Addended by: Pilar Grammes on: 03/07/2018 04:07 PM   Modules accepted: Orders

## 2018-03-07 NOTE — Progress Notes (Signed)
Subjective:    Patient ID: [name] [name], female    DOB: [birth year], 84 y.o.   MRN: [id number]  HPI Here for Medicare wellness visit Reviewed form (mostly blank) and advanced directives Reviewed other doctors Has occasional drink of alcohol--wine No tobacco Exercises regularly---swims and classes Fell twice in past year--- no injury. Just felt unsteady Vision is okay Hearing aides---not working well (going back to audiologist) Doesn't drive off campus. Transport or daughter for shopping Does all instrumental ADLs--cleaning person weekly Mild memory issues--more forgetful lately  Still having some troubles with the vaginosis Using metronidazole daily---discussed trying tid for a few days Next step is gynecologist  Having some sleep problems Initiates fine but awakens in middle of night--or after a couple of hours Does think about her husband some Reads book then back to sleep---this happens several times Has been feeling tired Has melatonin 10mg ---uses sporadically (usually sleeps better) Discussed trying 5mg  regularly Stays busy--not depressed. Not anhedonic  Some bladder problems Urge incontinence---wears liners Discussed frequent scheduled voids No pain or blood Is on detrol  Has had some chest pain ----usually from gas No exercise symptoms Breathing okay Rare dizziness--no syncope No edema  Current Outpatient Medications on File Prior to Visit  Medication Sig Dispense Refill  . acetaminophen (TYLENOL) 650 MG CR tablet Take 650 mg by mouth 3 (three) times daily.    Marland Kitchen aspirin 81 MG tablet Take 81 mg by mouth daily.    . celecoxib (CELEBREX) 200 MG capsule TAKE 1 CAPSULE DAILY 90 capsule 3  . [facility] VITAMIN D3 1000 UNITS capsule TAKE ONE CAPSULE BY MOUTH DAILY 30 capsule 0  . DETROL LA 4 MG 24 hr capsule TAKE 1 CAPSULE DAILY 90 capsule 3  . docusate sodium (STOOL SOFTENER) 100 MG capsule Take 100 mg by mouth daily as needed for mild constipation.    .  Lactobacillus (ACIDOPHILUS) 100 MG CAPS Take by mouth.    . levocetirizine (XYZAL) 5 MG tablet Take 5 mg by mouth every evening.    . Melatonin 10 MG TABS Take by mouth.    . metroNIDAZOLE (FLAGYL) 500 MG tablet Take 1 tablet (500 mg total) by mouth 2 (two) times daily. (Patient taking differently: Take 500 mg by mouth daily. ) 14 tablet 3  . Multiple Vitamin (MULTIVITAMIN) tablet Take 1 tablet by mouth daily.    . polyethylene glycol (MIRALAX / GLYCOLAX) packet Take 17 g by mouth daily as needed.    . Probiotic Product (PROBIOTIC ADVANCED PO) Take 1 capsule by mouth daily.    . Simethicone (GAS-X PO) Take by mouth 2 (two) times daily.     No current facility-administered medications on file prior to visit.     Allergies  Allergen Reactions  . Demerol [Meperidine]     dizzy  . Shellfish Allergy     Past Medical History:  Diagnosis Date  . Allergic rhinitis due to pollen    some shellfish also  . Aortic sclerosis 5/12   On echo. no stenosis  . Arthritis   . Diastolic dysfunction    stress echo otherwise normal 1/04. Repeat 5/11 also negative  . History of seasonal allergies   . Hyperlipidemia   . Osteoarthritis, multiple sites   . Osteoporosis   . Overactive bladder   . Urge incontinence     Past Surgical History:  Procedure Laterality Date  . CYSTOCELE REPAIR  2007   and rectocele  . DILATION AND CURETTAGE OF UTERUS    . INGUINAL  HERNIA REPAIR  2012   left side with mesh  . REVERSE SHOULDER ARTHROPLASTY Right 04/27/2017   Procedure: REVERSE SHOULDER ARTHROPLASTY;  Surgeon: Corky Mull, MD;  Location: [facility] OR[facility];  Service: Orthopedics;  Laterality: Right;  . TONSILLECTOMY AND ADENOIDECTOMY     as child  . TOTAL HIP ARTHROPLASTY Right 05/23/2015   Procedure: TOTAL HIP ARTHROPLASTY ANTERIOR APPROACH;  Surgeon: Hessie Knows, MD;  Location: [facility] OR[facility];  Service: Orthopedics;  Laterality: Right;  . UMBILICAL HERNIA REPAIR  02/04/06    Family History  Problem Relation Age  of Onset  . Cancer Father 54       colon cancer  . Heart disease Father     Social History   Socioeconomic History  . Marital status: Widowed    Spouse name: Not on file  . Number of children: 3  . Years of education: Not on file  . Highest education level: Not on file  Occupational History  . Occupation: Pharmacist, hospital    Comment: short time  . Occupation: Herbalist    Comment: most of career  Social Needs  . Financial resource strain: Not on file  . Food insecurity:    Worry: Not on file    Inability: Not on file  . Transportation needs:    Medical: Not on file    Non-medical: Not on file  Tobacco Use  . Smoking status: Former Research scientist (life sciences)  . Smokeless tobacco: Never Used  Substance and Sexual Activity  . Alcohol use: Yes    Comment: wine before dinner  . Drug use: No  . Sexual activity: Not on file  Lifestyle  . Physical activity:    Days per week: Not on file    Minutes per session: Not on file  . Stress: Not on file  Relationships  . Social connections:    Talks on phone: Not on file    Gets together: Not on file    Attends religious service: Not on file    Active member of club or organization: Not on file    Attends meetings of clubs or organizations: Not on file    Relationship status: Not on file  . Intimate partner violence:    Fear of current or ex partner: Not on file    Emotionally abused: Not on file    Physically abused: Not on file    Forced sexual activity: Not on file  Other Topics Concern  . Not on file  Social History Narrative   Widowed 2019   Has living will   Daughter [name] [name], is health care POA   Would accept trial of resuscitation   Would probably accept feeding tube   Review of Systems Appetite is stable Weight up some Wears seat belt Bowels are irregular at times---nothing worrisome No skin problems--no recent visit with dermatologist Teeth okay--- keeps up    Objective:   Physical Exam  Constitutional: She is oriented to  person, place, and time. She appears well-developed. No distress.  Neck: No thyromegaly present.  Cardiovascular: Normal rate, regular rhythm, normal heart sounds and intact distal pulses. Exam reveals no gallop.  No murmur heard. Respiratory: Effort normal and breath sounds normal. No respiratory distress. She has no wheezes. She has no rales.  GI: Soft. There is no tenderness.  Musculoskeletal: She exhibits no edema or tenderness.  Lymphadenopathy:    She has no cervical adenopathy.  Neurological: She is alert and oriented to person, place, and time.  President--- "Trump, Clinton---Obama, ?" [phone]  D-[id number] Recall 2/3  Skin: No rash noted. No erythema.  Psychiatric: She has a normal mood and affect. Her behavior is normal.           Assessment & Plan:

## 2018-03-07 NOTE — Assessment & Plan Note (Signed)
No clinical CHF No Rx needed Stay active

## 2018-03-07 NOTE — Patient Instructions (Addendum)
Do not use the benedryl. You can use loratadine instead for allergies. Try off the detrol to see if it is having any side effects (or even doing any good). Try melatonin 5mg  at bedtime regularly to see if you sleep better.

## 2018-03-07 NOTE — Assessment & Plan Note (Signed)
See social history 

## 2018-03-07 NOTE — Assessment & Plan Note (Signed)
I have personally reviewed the Medicare Annual Wellness questionnaire and have noted 1. The patient's medical and social history 2. Their use of alcohol, tobacco or illicit drugs 3. Their current medications and supplements 4. The patient's functional ability including ADL's, fall risks, home safety risks and hearing or visual             impairment. 5. Diet and physical activities 6. Evidence for depression or mood disorders  The patients weight, height, BMI and visual acuity have been recorded in the chart I have made referrals, counseling and provided education to the patient based review of the above and I have provided the pt with a written personalized care plan for preventive services.  I have provided you with a copy of your personalized plan for preventive services. Please take the time to review along with your updated medication list.  Update pneumovax today Yearly flu vaccine Discussed leg strength Consider shingrix No cancer screening due to age

## 2018-03-07 NOTE — Assessment & Plan Note (Signed)
Asked her to try off the detrol to be sure it is helping (and to reduce risk of side effects)

## 2018-03-07 NOTE — Assessment & Plan Note (Signed)
To gyn if persistent trouble

## 2018-03-07 NOTE — Progress Notes (Signed)
Hearing Screening Comments: Wearing hearing aids Vision Screening Comments: February 2019

## 2018-03-08 DIAGNOSIS — J3081 Allergic rhinitis due to animal (cat) (dog) hair and dander: Secondary | ICD-10-CM | POA: Diagnosis not present

## 2018-03-08 DIAGNOSIS — J301 Allergic rhinitis due to pollen: Secondary | ICD-10-CM | POA: Diagnosis not present

## 2018-03-08 DIAGNOSIS — J3089 Other allergic rhinitis: Secondary | ICD-10-CM | POA: Diagnosis not present

## 2018-03-08 LAB — CBC
HCT: 38.6 % (ref 36.0–46.0)
Hemoglobin: 13 g/dL (ref 12.0–15.0)
MCHC: 33.6 g/dL (ref 30.0–36.0)
MCV: 94.9 fl (ref 78.0–100.0)
Platelets: 288 10*3/uL (ref 150.0–400.0)
RBC: 4.07 Mil/uL (ref 3.87–5.11)
RDW: 14 % (ref 11.5–15.5)
WBC: 8.4 10*3/uL (ref 4.0–10.5)

## 2018-03-08 LAB — COMPREHENSIVE METABOLIC PANEL
ALT: 14 U/L (ref 0–35)
AST: 20 U/L (ref 0–37)
Albumin: 4.1 g/dL (ref 3.5–5.2)
Alkaline Phosphatase: 58 U/L (ref 39–117)
BILIRUBIN TOTAL: 0.3 mg/dL (ref 0.2–1.2)
BUN: 18 mg/dL (ref 6–23)
CALCIUM: 9.5 mg/dL (ref 8.4–10.5)
CO2: 29 meq/L (ref 19–32)
CREATININE: 0.77 mg/dL (ref 0.40–1.20)
Chloride: 99 mEq/L (ref 96–112)
GFR: 74.53 mL/min (ref >60.00)
GLUCOSE: 102 mg/dL — AB (ref 70–99)
Potassium: 4.7 mEq/L (ref 3.5–5.1)
Sodium: 133 mEq/L — ABNORMAL LOW (ref 135–145)
Total Protein: 6.8 g/dL (ref 6.0–8.3)

## 2018-03-09 ENCOUNTER — Ambulatory Visit: Payer: Self-pay

## 2018-03-09 NOTE — Telephone Encounter (Signed)
Returned call to pt.  Reported c/o redness, warmth, tenderness and some swelling at the left upper arm injection site.  Reported rec'd a Pneumonia vaccine on Monday.  Stated the sx's started shortly after the injection, and have increased since then.  Reported taking Tylenol every 4 hrs., with some relief.  Denied any signs of difficulty  breathing, or swelling of lips, tongue, or throat.  Denied any other symptoms.  Reassured pt. that local reaction, at injection site, within the initial 1-3 days is common.  Reviewed home care instructions per protocol.  Encouraged to call back if no improvement, or worsening of symptoms.  Verb. Understanding.  Agrees with plan.    Reason for Disposition . Injection site reaction to any vaccine . Pneumococcal vaccine reactions  Answer Assessment - Initial Assessment Questions 1. SYMPTOMS: "What is the main symptom?" (e.g., redness, swelling, pain)      Redness, warmth, and some swelling at injection site 2. ONSET: "When was the vaccine (shot) given?" "How much later did the _ begin?" (e.g., hours, days ago)      Monday, 03/07/18; symptoms began shortly after shot given, and have worsened 3. SEVERITY: "How bad is it?"     moderate 4. FEVER: "Is there a fever?" If so, ask: "What is it, how was it measured, and when did it start?"      denied 5. IMMUNIZATIONS GIVEN: "What shots have you recently received?"     Pneumonia vaccine  6. PAST REACTIONS: "Have you reacted to immunizations before?" If so, ask: "What happened?"     Not normally  7. OTHER SYMPTOMS: "Do you have any other symptoms?"     Denied  Protocols used: IMMUNIZATION REACTIONS-A-AH

## 2018-03-11 ENCOUNTER — Other Ambulatory Visit: Payer: Self-pay | Admitting: Internal Medicine

## 2018-03-11 NOTE — Telephone Encounter (Signed)
She said it is doing better and did want to continue taking it. She understands it is for a week and if it comes back there is a refill.

## 2018-03-11 NOTE — Telephone Encounter (Signed)
Find out if it is helping Should just take for a week  If it helped but recurred, okay to send #14 x1

## 2018-03-11 NOTE — Telephone Encounter (Signed)
Last Rx 02/02/2018 #14 3R. Last OV 03/07/2018. pls advise

## 2018-03-15 DIAGNOSIS — J301 Allergic rhinitis due to pollen: Secondary | ICD-10-CM | POA: Diagnosis not present

## 2018-03-15 DIAGNOSIS — J3081 Allergic rhinitis due to animal (cat) (dog) hair and dander: Secondary | ICD-10-CM | POA: Diagnosis not present

## 2018-03-15 DIAGNOSIS — J3089 Other allergic rhinitis: Secondary | ICD-10-CM | POA: Diagnosis not present

## 2018-03-22 DIAGNOSIS — J301 Allergic rhinitis due to pollen: Secondary | ICD-10-CM | POA: Diagnosis not present

## 2018-03-22 DIAGNOSIS — J3081 Allergic rhinitis due to animal (cat) (dog) hair and dander: Secondary | ICD-10-CM | POA: Diagnosis not present

## 2018-03-22 DIAGNOSIS — J3089 Other allergic rhinitis: Secondary | ICD-10-CM | POA: Diagnosis not present

## 2018-03-29 DIAGNOSIS — J301 Allergic rhinitis due to pollen: Secondary | ICD-10-CM | POA: Diagnosis not present

## 2018-03-29 DIAGNOSIS — J3081 Allergic rhinitis due to animal (cat) (dog) hair and dander: Secondary | ICD-10-CM | POA: Diagnosis not present

## 2018-03-29 DIAGNOSIS — J3089 Other allergic rhinitis: Secondary | ICD-10-CM | POA: Diagnosis not present

## 2018-03-31 ENCOUNTER — Other Ambulatory Visit: Payer: Self-pay | Admitting: Internal Medicine

## 2018-04-01 ENCOUNTER — Other Ambulatory Visit: Payer: Self-pay | Admitting: Internal Medicine

## 2018-04-01 NOTE — Telephone Encounter (Signed)
Name of Medication: Flagyl 500 mg Name of Pharmacy: [facility] [address] [phone] Last Fill or Written Date and Quantity:# 14 x 1  on 03/11/18  Last Office Visit and Type: 03/07/18 annual exam  Next Office Visit and Type:03/09/2019 annual

## 2018-04-01 NOTE — Telephone Encounter (Signed)
Copied from [place] [id number]. Topic: Quick Communication - Rx Refill/Question >> Apr 01, 2018 10:19 AM Oliver Pila B wrote: Medication: metroNIDAZOLE (FLAGYL) 500 MG tablet [[id number]]   Has the patient contacted their pharmacy? Yes.   (Agent: If no, request that the patient contact the pharmacy for the refill.) (Agent: If yes, when and what did the pharmacy advise?)  Preferred Pharmacy (with phone number or street name): [facility]  Agent: Please be advised that RX refills may take up to 3 business days. We ask that you follow-up with your pharmacy.

## 2018-04-01 NOTE — Telephone Encounter (Signed)
Please find out if she is still taking it every day---and if so, is it helping? Okay to fill #30 x 0 if she is---but I would recommend she see a gyn for persistent problems (she can call [facility] to set up appt)

## 2018-04-01 NOTE — Telephone Encounter (Signed)
Flagyl 500 mg tablet refill Last Refill:03/21/18 # 14 Last OV: 03/07/18 PCP: Silvio Pate Pharmacy:[id number]

## 2018-04-01 NOTE — Telephone Encounter (Signed)
Left message to call office to find out the answers to Dr Alla German questions.

## 2018-04-03 ENCOUNTER — Other Ambulatory Visit: Payer: Self-pay | Admitting: Internal Medicine

## 2018-04-03 DIAGNOSIS — N898 Other specified noninflammatory disorders of vagina: Secondary | ICD-10-CM

## 2018-04-03 HISTORY — DX: Other specified noninflammatory disorders of vagina: N89.8

## 2018-04-05 ENCOUNTER — Telehealth: Payer: Self-pay

## 2018-04-05 DIAGNOSIS — J3089 Other allergic rhinitis: Secondary | ICD-10-CM | POA: Diagnosis not present

## 2018-04-05 DIAGNOSIS — N760 Acute vaginitis: Principal | ICD-10-CM

## 2018-04-05 DIAGNOSIS — J301 Allergic rhinitis due to pollen: Secondary | ICD-10-CM | POA: Diagnosis not present

## 2018-04-05 DIAGNOSIS — B9689 Other specified bacterial agents as the cause of diseases classified elsewhere: Secondary | ICD-10-CM

## 2018-04-05 DIAGNOSIS — J3081 Allergic rhinitis due to animal (cat) (dog) hair and dander: Secondary | ICD-10-CM | POA: Diagnosis not present

## 2018-04-05 MED ORDER — METRONIDAZOLE 500 MG PO TABS: ORAL_TABLET | ORAL | 1 refills | Status: DC

## 2018-04-05 NOTE — Telephone Encounter (Signed)
Copied from [place] [id number]. Topic: Referral - Request >> Apr 05, 2018  9:28 AM Vernona Rieger wrote: Reason for CRM: Patient's daughter Rosann Auerbach) called and needs a referral sent to [facility] for a UTI. Phone Number [phone]

## 2018-04-05 NOTE — Telephone Encounter (Signed)
rx sent

## 2018-04-05 NOTE — Telephone Encounter (Signed)
Pt daughter [name] called stating that pt was taking every day until she ran out of Flagyl. She said pt informed her there is still odor and discharge. [name] said the refill was denied. I provided [facility] ph # for her to call.   [name] call back [phone]  [id number] - [place], [place].Marland KitchenMarland Kitchen

## 2018-04-11 ENCOUNTER — Encounter: Payer: Self-pay | Admitting: Obstetrics and Gynecology

## 2018-04-11 ENCOUNTER — Ambulatory Visit (INDEPENDENT_AMBULATORY_CARE_PROVIDER_SITE_OTHER): Payer: Medicare Other | Admitting: Obstetrics and Gynecology

## 2018-04-11 VITALS — BP 140/78 | HR 68 | Ht 65.0 in | Wt 138.0 lb

## 2018-04-11 DIAGNOSIS — N898 Other specified noninflammatory disorders of vagina: Secondary | ICD-10-CM | POA: Diagnosis not present

## 2018-04-11 LAB — POCT WET PREP WITH KOH
CLUE CELLS WET PREP PER HPF POC: NEGATIVE
KOH PREP POC: NEGATIVE
TRICHOMONAS UA: NEGATIVE
Yeast Wet Prep HPF POC: NEGATIVE

## 2018-04-11 NOTE — Progress Notes (Signed)
[name] Carbon, MD   Chief Complaint  Patient presents with  . Vaginitis     has been treated at her PCP and has had yeast infection for 3 or months, odor, discharge, no itchiness    HPI:      [name] is a 84 y.o. No obstetric history on file. who LMP was No LMP recorded. Patient is postmenopausal., presents today for NP eval of increased vag d/c with non-fishy odor, no vag irritation for the past 3 months. Referred by PCP.  Has treated with metrogel and flagyl with temporary relief. Vag applicator with metrogel too painful/difficult for pt. No vag dryness that is bothersome to pt. Occas urin incont. Uses aveeno body wash, occas dryer sheets. No vag bleeding/PMB.  Past Medical History:  Diagnosis Date  . Allergic rhinitis due to pollen    some shellfish also  . Aortic sclerosis 5/12   On echo. no stenosis  . Arthritis   . Diastolic dysfunction    stress echo otherwise normal 1/04. Repeat 5/11 also negative  . History of seasonal allergies   . Hyperlipidemia   . Osteoarthritis, multiple sites   . Osteoporosis   . Overactive bladder   . Urge incontinence     Past Surgical History:  Procedure Laterality Date  . CYSTOCELE REPAIR  2007   and rectocele  . DILATION AND CURETTAGE OF UTERUS    . INGUINAL HERNIA REPAIR  2012   left side with mesh  . REVERSE SHOULDER ARTHROPLASTY Right 04/27/2017   Procedure: REVERSE SHOULDER ARTHROPLASTY;  Surgeon: Corky Mull, MD;  Location: [facility];  Service: Orthopedics;  Laterality: Right;  . TONSILLECTOMY AND ADENOIDECTOMY     as child  . TOTAL HIP ARTHROPLASTY Right 05/23/2015   Procedure: TOTAL HIP ARTHROPLASTY ANTERIOR APPROACH;  Surgeon: Hessie Knows, MD;  Location: [facility];  Service: Orthopedics;  Laterality: Right;  . UMBILICAL HERNIA REPAIR  02/04/06    Family History  Problem Relation Age of Onset  . Cancer Father 57       colon cancer  . Heart disease Father     Social History   Socioeconomic  History  . Marital status: Widowed    Spouse name: Not on file  . Number of children: 3  . Years of education: Not on file  . Highest education level: Not on file  Occupational History  . Occupation: Pharmacist, hospital    Comment: short time  . Occupation: Herbalist    Comment: most of career  Social Needs  . Financial resource strain: Not on file  . Food insecurity:    Worry: Not on file    Inability: Not on file  . Transportation needs:    Medical: Not on file    Non-medical: Not on file  Tobacco Use  . Smoking status: Former Research scientist (life sciences)  . Smokeless tobacco: Never Used  Substance and Sexual Activity  . Alcohol use: Yes    Comment: wine before dinner  . Drug use: No  . Sexual activity: Not on file  Lifestyle  . Physical activity:    Days per week: Not on file    Minutes per session: Not on file  . Stress: Not on file  Relationships  . Social connections:    Talks on phone: Not on file    Gets together: Not on file    Attends religious service: Not on file    Active member of club or organization: Not on file  Attends meetings of clubs or organizations: Not on file    Relationship status: Not on file  . Intimate partner violence:    Fear of current or ex partner: Not on file    Emotionally abused: Not on file    Physically abused: Not on file    Forced sexual activity: Not on file  Other Topics Concern  . Not on file  Social History Narrative   Widowed 2019   Has living will   Daughter [name], is health care POA   Would accept trial of resuscitation   Would probably accept feeding tube    Outpatient Medications Prior to Visit  Medication Sig Dispense Refill  . acetaminophen (TYLENOL) 650 MG CR tablet Take 650 mg by mouth 3 (three) times daily.    Marland Kitchen aspirin 81 MG tablet Take 81 mg by mouth daily.    . celecoxib (CELEBREX) 200 MG capsule TAKE 1 CAPSULE DAILY 90 capsule 3  . CVS VITAMIN D3 1000 UNITS capsule TAKE ONE CAPSULE BY MOUTH DAILY 30 capsule 0  . DETROL LA  4 MG 24 hr capsule TAKE 1 CAPSULE DAILY 90 capsule 3  . docusate sodium (STOOL SOFTENER) 100 MG capsule Take 100 mg by mouth daily as needed for mild constipation.    . dorzolamide-timolol (COSOPT) 22.3-6.8 MG/ML ophthalmic solution Apply to eye.    . Lactobacillus (ACIDOPHILUS) 100 MG CAPS Take by mouth.    . latanoprost (XALATAN) 0.005 % ophthalmic solution     . levocetirizine (XYZAL) 5 MG tablet Take 5 mg by mouth every evening.    . Melatonin 10 MG TABS Take by mouth.    . metroNIDAZOLE (FLAGYL) 500 MG tablet TAKE 1 TABLET(500 MG) BY MOUTH TWICE DAILY 14 tablet 1  . Multiple Vitamin (MULTIVITAMIN) tablet Take 1 tablet by mouth daily.    . polyethylene glycol (MIRALAX / GLYCOLAX) packet Take 17 g by mouth daily as needed.    . Probiotic Product (PROBIOTIC ADVANCED PO) Take 1 capsule by mouth daily.    . Simethicone (GAS-X PO) Take by mouth 2 (two) times daily.     No facility-administered medications prior to visit.      ROS:  Review of Systems  Constitutional: Negative for fever.  Gastrointestinal: Negative for blood in stool, constipation, diarrhea, nausea and vomiting.  Genitourinary: Positive for vaginal discharge. Negative for dyspareunia, dysuria, flank pain, frequency, hematuria, urgency, vaginal bleeding and vaginal pain.  Musculoskeletal: Negative for back pain.  Skin: Negative for rash.   BREAST: No symptoms   OBJECTIVE:   Vitals:  BP 140/78   Pulse 68   Ht 5\' 5"  (1.651 m)   Wt 138 lb (62.6 kg)   BMI 22.96 kg/m   Physical Exam  Constitutional: She is oriented to person, place, and time. Vital signs are normal. She appears well-developed.  Pulmonary/Chest: Effort normal.  Genitourinary: Vagina normal and uterus normal. There is no rash, tenderness or lesion on the right labia. There is no rash, tenderness or lesion on the left labia. Uterus is not enlarged and not tender. Cervix exhibits no motion tenderness. Right adnexum displays no mass and no tenderness.  Left adnexum displays no mass and no tenderness. No erythema or tenderness in the vagina. No vaginal discharge found.  Musculoskeletal: Normal range of motion.  Neurological: She is alert and oriented to person, place, and time.  Psychiatric: She has a normal mood and affect. Her behavior is normal. Thought content normal.  Vitals reviewed.   Results: Results for  orders placed or performed in visit on 04/11/18 (from the past 24 hour(s))  POCT Wet Prep with KOH     Status: Normal   Collection Time: 04/11/18  3:41 PM  Result Value Ref Range   Trichomonas, UA Negative    Clue Cells Wet Prep HPF POC neg    Epithelial Wet Prep HPF POC     Yeast Wet Prep HPF POC neg    Bacteria Wet Prep HPF POC     RBC Wet Prep HPF POC     WBC Wet Prep HPF POC     KOH Prep POC Negative Negative    Assessment/Plan: Vaginal discharge - Neg wet prep. Check MDL AV and BV culture. Will call with results and f/u. Dove sens skin soap/no dryer sheets.  - Plan: Other/Misc lab test, POCT Wet Prep with KOH  Vaginal odor - Plan: Other/Misc lab test    Return if symptoms worsen or fail to improve.  Keenya Matera B. Shawnee Higham, PA-C 04/11/2018 3:47 PM

## 2018-04-11 NOTE — Patient Instructions (Signed)
I value your feedback and entrusting us with your care. If you get a [place] patient survey, I would appreciate you taking the time to let us know about your experience today. Thank you! 

## 2018-04-12 DIAGNOSIS — J3081 Allergic rhinitis due to animal (cat) (dog) hair and dander: Secondary | ICD-10-CM | POA: Diagnosis not present

## 2018-04-12 DIAGNOSIS — J301 Allergic rhinitis due to pollen: Secondary | ICD-10-CM | POA: Diagnosis not present

## 2018-04-12 DIAGNOSIS — J3089 Other allergic rhinitis: Secondary | ICD-10-CM | POA: Diagnosis not present

## 2018-04-18 ENCOUNTER — Telehealth: Payer: Self-pay | Admitting: Obstetrics and Gynecology

## 2018-04-18 MED ORDER — MOXIFLOXACIN HCL 400 MG PO TABS: 400.0000 mg | ORAL_TABLET | Freq: Every day | ORAL | 0 refills | Status: AC

## 2018-04-18 NOTE — Telephone Encounter (Signed)
Pt with Entero. Faecalis and E.coli (Aerobic vaginitis) on MDL One swab culture. No evid of BV. Rx avelox. Pt's daughter, [name], aware. F/u prn.

## 2018-04-19 DIAGNOSIS — J3081 Allergic rhinitis due to animal (cat) (dog) hair and dander: Secondary | ICD-10-CM | POA: Diagnosis not present

## 2018-04-19 DIAGNOSIS — J301 Allergic rhinitis due to pollen: Secondary | ICD-10-CM | POA: Diagnosis not present

## 2018-04-19 DIAGNOSIS — J3089 Other allergic rhinitis: Secondary | ICD-10-CM | POA: Diagnosis not present

## 2018-04-21 ENCOUNTER — Other Ambulatory Visit: Payer: Self-pay | Admitting: Internal Medicine

## 2018-04-22 ENCOUNTER — Other Ambulatory Visit: Payer: Self-pay | Admitting: Obstetrics and Gynecology

## 2018-04-22 NOTE — Telephone Encounter (Signed)
I spoke with pt and her daughter [name] [name]. Pt's condition is getting better but not gone completely. Pt is wanting a refill on Moxifloxacin. Please advise.

## 2018-04-22 NOTE — Telephone Encounter (Signed)
Pt needs a refill on Moxifloxacin, state she called early this week and has not got the refill yet. Please call while daughter is in town to pick up medication

## 2018-04-22 NOTE — Telephone Encounter (Signed)
Please advise 

## 2018-04-23 NOTE — Telephone Encounter (Signed)
What are pt's sx that have persisted? Shouldn't need a Rf.

## 2018-04-25 ENCOUNTER — Other Ambulatory Visit: Payer: Self-pay | Admitting: Obstetrics and Gynecology

## 2018-04-25 MED ORDER — AMPICILLIN 500 MG PO CAPS: 500.0000 mg | ORAL_CAPSULE | Freq: Four times a day (QID) | ORAL | 0 refills | Status: AC

## 2018-04-25 NOTE — Telephone Encounter (Signed)
Still having the discharge, odor, and some itchiness.

## 2018-04-25 NOTE — Telephone Encounter (Signed)
Abx changed due to persistent sx. Rx amp 500 mg QID for 7 days. Add probitiocs (very important in treatment and prevention of sx). F/u prn. RN to notify pt.

## 2018-04-25 NOTE — Telephone Encounter (Signed)
Pt aware.

## 2018-04-25 NOTE — Progress Notes (Signed)
Rx amp for AV sx. Already treated with avelox but sx persisting. Add probiotics.

## 2018-04-26 DIAGNOSIS — J301 Allergic rhinitis due to pollen: Secondary | ICD-10-CM | POA: Diagnosis not present

## 2018-04-26 DIAGNOSIS — J3089 Other allergic rhinitis: Secondary | ICD-10-CM | POA: Diagnosis not present

## 2018-04-26 DIAGNOSIS — J3081 Allergic rhinitis due to animal (cat) (dog) hair and dander: Secondary | ICD-10-CM | POA: Diagnosis not present

## 2018-04-27 DIAGNOSIS — J301 Allergic rhinitis due to pollen: Secondary | ICD-10-CM | POA: Diagnosis not present

## 2018-05-03 ENCOUNTER — Other Ambulatory Visit: Payer: Self-pay | Admitting: Obstetrics and Gynecology

## 2018-05-03 DIAGNOSIS — J301 Allergic rhinitis due to pollen: Secondary | ICD-10-CM | POA: Diagnosis not present

## 2018-05-03 DIAGNOSIS — J3081 Allergic rhinitis due to animal (cat) (dog) hair and dander: Secondary | ICD-10-CM | POA: Diagnosis not present

## 2018-05-03 DIAGNOSIS — J3089 Other allergic rhinitis: Secondary | ICD-10-CM | POA: Diagnosis not present

## 2018-05-03 NOTE — Telephone Encounter (Signed)
Please advise 

## 2018-05-06 ENCOUNTER — Telehealth: Payer: Self-pay

## 2018-05-06 NOTE — Telephone Encounter (Signed)
Pt's daughter [name] called and said pt is still having light discharge and odor. Pt took second Rx you sent in but is still concerned about these two symptoms, no itchiness. [name] would like to know what can be done, does pt need to come in for a visit or can you send a Rx to pharmacy. Pt will be going out of town this afternoon around 3:30 pm and will be back around noon time Monday. Please advise. Thank you!

## 2018-05-06 NOTE — Telephone Encounter (Signed)
Called [name] [name] and left vm to call back to schedule appt.

## 2018-05-06 NOTE — Telephone Encounter (Signed)
Rxs should have worked. Pt needs return appt. Pls notify. Thx

## 2018-05-09 ENCOUNTER — Other Ambulatory Visit (HOSPITAL_COMMUNITY)
Admission: RE | Admit: 2018-05-09 | Discharge: 2018-05-09 | Disposition: A | Discharge status: Home / Self Care | Payer: Medicare Other | Source: Ambulatory Visit | Attending: Obstetrics and Gynecology | Admitting: Obstetrics and Gynecology

## 2018-05-09 ENCOUNTER — Encounter: Payer: Self-pay | Admitting: Obstetrics and Gynecology

## 2018-05-09 ENCOUNTER — Telehealth: Payer: Self-pay

## 2018-05-09 ENCOUNTER — Ambulatory Visit (INDEPENDENT_AMBULATORY_CARE_PROVIDER_SITE_OTHER): Payer: Medicare Other | Admitting: Obstetrics and Gynecology

## 2018-05-09 VITALS — BP 150/70 | HR 80 | Ht 66.0 in | Wt 139.0 lb

## 2018-05-09 DIAGNOSIS — N898 Other specified noninflammatory disorders of vagina: Secondary | ICD-10-CM | POA: Diagnosis not present

## 2018-05-09 DIAGNOSIS — N950 Postmenopausal bleeding: Secondary | ICD-10-CM

## 2018-05-09 DIAGNOSIS — R1032 Left lower quadrant pain: Secondary | ICD-10-CM

## 2018-05-09 DIAGNOSIS — Z124 Encounter for screening for malignant neoplasm of cervix: Secondary | ICD-10-CM | POA: Diagnosis not present

## 2018-05-09 LAB — POCT WET PREP WITH KOH
Clue Cells Wet Prep HPF POC: NEGATIVE
KOH PREP POC: NEGATIVE
TRICHOMONAS UA: NEGATIVE
Yeast Wet Prep HPF POC: NEGATIVE

## 2018-05-09 NOTE — Progress Notes (Signed)
Venia Carbon, MD   Chief Complaint  Patient presents with  . Vaginal Discharge    discharge and odor, no itchiness    HPI:      [name] is a 84 y.o. No obstetric history on file. who LMP was No LMP recorded. Patient is postmenopausal., presents today for vag d/c f/u. Pt was seen 04/11/18 for several month hx of increased d/c and non-fishy odor, without irritation. Pt had been treated by PCP with flagyl without relief. One Swab culture 9/19 showed AV (Entero faecalis and E.Coli). Pt treated with avelox and then ampicillin with temporary relief, but sx recurred once abx stopped. Pt with occas urge incont. Pt now admits to infrequent PMB, noted in underwear. No hx of abn paps in past. Not sex active. She also has occas sharp, fleeting LLQ pains and bloating. No GI sx. Has BMs QOD, which is normal for pt. No change in appetite. No UTI sx. No fevers, LBP. No FH breast/ovar cancer.   Past Medical History:  Diagnosis Date  . Allergic rhinitis due to pollen    some shellfish also  . Aortic sclerosis 5/12   On echo. no stenosis  . Arthritis   . Diastolic dysfunction    stress echo otherwise normal 1/04. Repeat 5/11 also negative  . History of seasonal allergies   . Hyperlipidemia   . Osteoarthritis, multiple sites   . Osteoporosis   . Overactive bladder   . Urge incontinence     Past Surgical History:  Procedure Laterality Date  . CYSTOCELE REPAIR  2007   and rectocele  . DILATION AND CURETTAGE OF UTERUS    . INGUINAL HERNIA REPAIR  2012   left side with mesh  . REVERSE SHOULDER ARTHROPLASTY Right 04/27/2017   Procedure: REVERSE SHOULDER ARTHROPLASTY;  Surgeon: Corky Mull, MD;  Location: [facility];  Service: Orthopedics;  Laterality: Right;  . TONSILLECTOMY AND ADENOIDECTOMY     as child  . TOTAL HIP ARTHROPLASTY Right 05/23/2015   Procedure: TOTAL HIP ARTHROPLASTY ANTERIOR APPROACH;  Surgeon: Hessie Knows, MD;  Location: [facility];  Service: Orthopedics;   Laterality: Right;  . UMBILICAL HERNIA REPAIR  02/04/06    Family History  Problem Relation Age of Onset  . Cancer Father 81       colon cancer  . Heart disease Father     Social History   Socioeconomic History  . Marital status: Widowed    Spouse name: Not on file  . Number of children: 3  . Years of education: Not on file  . Highest education level: Not on file  Occupational History  . Occupation: Pharmacist, hospital    Comment: short time  . Occupation: Herbalist    Comment: most of career  Social Needs  . Financial resource strain: Not on file  . Food insecurity:    Worry: Not on file    Inability: Not on file  . Transportation needs:    Medical: Not on file    Non-medical: Not on file  Tobacco Use  . Smoking status: Former Research scientist (life sciences)  . Smokeless tobacco: Never Used  Substance and Sexual Activity  . Alcohol use: Yes    Comment: wine before dinner  . Drug use: No  . Sexual activity: Not on file  Lifestyle  . Physical activity:    Days per week: Not on file    Minutes per session: Not on file  . Stress: Not on file  Relationships  .  Social connections:    Talks on phone: Not on file    Gets together: Not on file    Attends religious service: Not on file    Active member of club or organization: Not on file    Attends meetings of clubs or organizations: Not on file    Relationship status: Not on file  . Intimate partner violence:    Fear of current or ex partner: Not on file    Emotionally abused: Not on file    Physically abused: Not on file    Forced sexual activity: Not on file  Other Topics Concern  . Not on file  Social History Narrative   Widowed 2019   Has living will   Daughter [name], is health care POA   Would accept trial of resuscitation   Would probably accept feeding tube    Outpatient Medications Prior to Visit  Medication Sig Dispense Refill  . acetaminophen (TYLENOL) 650 MG CR tablet Take 650 mg by mouth 3 (three) times daily.    Marland Kitchen  aspirin 81 MG tablet Take 81 mg by mouth daily.    . celecoxib (CELEBREX) 200 MG capsule TAKE 1 CAPSULE DAILY 90 capsule 3  . CVS VITAMIN D3 1000 UNITS capsule TAKE ONE CAPSULE BY MOUTH DAILY 30 capsule 0  . DETROL LA 4 MG 24 hr capsule TAKE 1 CAPSULE DAILY 90 capsule 3  . docusate sodium (STOOL SOFTENER) 100 MG capsule Take 100 mg by mouth daily as needed for mild constipation.    . dorzolamide-timolol (COSOPT) 22.3-6.8 MG/ML ophthalmic solution Apply to eye.    . Lactobacillus (ACIDOPHILUS) 100 MG CAPS Take by mouth.    . latanoprost (XALATAN) 0.005 % ophthalmic solution     . levocetirizine (XYZAL) 5 MG tablet Take 5 mg by mouth every evening.    . Melatonin 10 MG TABS Take by mouth.    . metroNIDAZOLE (FLAGYL) 500 MG tablet TAKE 1 TABLET(500 MG) BY MOUTH TWICE DAILY 14 tablet 1  . Multiple Vitamin (MULTIVITAMIN) tablet Take 1 tablet by mouth daily.    . polyethylene glycol (MIRALAX / GLYCOLAX) packet Take 17 g by mouth daily as needed.    . Probiotic Product (PROBIOTIC ADVANCED PO) Take 1 capsule by mouth daily.    . Simethicone (GAS-X PO) Take by mouth 2 (two) times daily.     No facility-administered medications prior to visit.       ROS:  Review of Systems  Constitutional: Negative for fatigue, fever and unexpected weight change.  Respiratory: Negative for cough, shortness of breath and wheezing.   Cardiovascular: Negative for chest pain, palpitations and leg swelling.  Gastrointestinal: Negative for blood in stool, constipation, diarrhea, nausea and vomiting.  Endocrine: Negative for cold intolerance, heat intolerance and polyuria.  Genitourinary: Positive for pelvic pain, vaginal bleeding and vaginal discharge. Negative for dyspareunia, dysuria, flank pain, frequency, genital sores, hematuria, menstrual problem, urgency and vaginal pain.  Musculoskeletal: Negative for back pain, joint swelling and myalgias.  Skin: Negative for rash.  Neurological: Negative for dizziness,  syncope, light-headedness, numbness and headaches.  Hematological: Negative for adenopathy.  Psychiatric/Behavioral: Negative for agitation, confusion, sleep disturbance and suicidal ideas. The patient is not nervous/anxious.    BREAST: No symptoms   OBJECTIVE:   Vitals:  BP (!) 150/70   Pulse 80   Ht 5\' 6"  (1.676 m)   Wt 139 lb (63 kg)   BMI 22.44 kg/m   Physical Exam  Constitutional: She is oriented to person,  place, and time. Vital signs are normal. She appears well-developed.  Pulmonary/Chest: Effort normal.  Abdominal: Soft. There is tenderness in the suprapubic area and left lower quadrant. There is no rigidity and no guarding.  Genitourinary: Vagina normal and uterus normal. There is no rash, tenderness or lesion on the right labia. There is no rash, tenderness or lesion on the left labia. Uterus is not enlarged and not tender. Cervix exhibits no motion tenderness. Right adnexum displays no mass and no tenderness. Left adnexum displays no mass and no tenderness. No erythema or tenderness in the vagina. No vaginal discharge found.  Musculoskeletal: Normal range of motion.  Neurological: She is alert and oriented to person, place, and time.  Psychiatric: She has a normal mood and affect. Her behavior is normal. Thought content normal.  Vitals reviewed.   Results: Results for orders placed or performed in visit on 05/09/18 (from the past 24 hour(s))  POCT Wet Prep with KOH     Status: Normal   Collection Time: 05/09/18  3:56 PM  Result Value Ref Range   Trichomonas, UA Negative    Clue Cells Wet Prep HPF POC neg    Epithelial Wet Prep HPF POC     Yeast Wet Prep HPF POC negn    Bacteria Wet Prep HPF POC     RBC Wet Prep HPF POC     WBC Wet Prep HPF POC     KOH Prep POC Negative Negative     Assessment/Plan: Vaginal discharge, Chronic - Not resolved with tx. Rechk One Swab culture/check pap. Will call wiht results.  - Plan: POCT Wet Prep with KOH, Other/Misc lab  test  Cervical cancer screening - Plan: Cytology - PAP  PMB (postmenopausal bleeding) - Check GYN u/s and pap. Will call with results.  - Plan: US PELVIS TRANSVANGINAL NON-OB (TV ONLY), Cytology - PAP  LLQ pain - Plan: US PELVIS TRANSVANGINAL NON-OB (TV ONLY)     Return in about 1 day (around 05/10/2018) for GYN u/s for PMB--[facility] to call pt.  Alicia B. Copland, PA-C 05/09/2018 3:58 PM

## 2018-05-09 NOTE — Telephone Encounter (Signed)
[name], daughter, calling to inquire about the appointment pt has on Wednesday that she noticed on my chart. CA#[phone]

## 2018-05-09 NOTE — Telephone Encounter (Signed)
Spoke with daughter. Pt has u/s sched for PMB/pelvic pain. Will f/u with results.

## 2018-05-09 NOTE — Patient Instructions (Signed)
I value your feedback and entrusting us with your care. If you get a [facility] patient survey, I would appreciate you taking the time to let us know about your experience today. Thank you! 

## 2018-05-10 DIAGNOSIS — J3081 Allergic rhinitis due to animal (cat) (dog) hair and dander: Secondary | ICD-10-CM | POA: Diagnosis not present

## 2018-05-10 DIAGNOSIS — J3089 Other allergic rhinitis: Secondary | ICD-10-CM | POA: Diagnosis not present

## 2018-05-10 DIAGNOSIS — J301 Allergic rhinitis due to pollen: Secondary | ICD-10-CM | POA: Diagnosis not present

## 2018-05-11 ENCOUNTER — Ambulatory Visit (INDEPENDENT_AMBULATORY_CARE_PROVIDER_SITE_OTHER): Payer: Medicare Other

## 2018-05-11 DIAGNOSIS — R1032 Left lower quadrant pain: Secondary | ICD-10-CM | POA: Diagnosis not present

## 2018-05-11 DIAGNOSIS — N950 Postmenopausal bleeding: Secondary | ICD-10-CM

## 2018-05-11 LAB — CYTOLOGY - PAP: Diagnosis: NEGATIVE

## 2018-05-12 ENCOUNTER — Telehealth: Payer: Self-pay

## 2018-05-12 NOTE — Telephone Encounter (Signed)
[name] returning call to [facility]. HS#[phone]

## 2018-05-12 NOTE — Telephone Encounter (Signed)
LMTRC

## 2018-05-13 NOTE — Telephone Encounter (Signed)
Spoke with pt's daughter, [name]. Aware of u/s results and need for EMB due to PMB and EM=4.1 mm, per Dr. Kenton Kingfisher. She will call back to sched. Needs a Fri appt with MD. .

## 2018-05-17 ENCOUNTER — Telehealth: Payer: Self-pay | Admitting: Obstetrics and Gynecology

## 2018-05-17 ENCOUNTER — Encounter: Payer: Self-pay | Admitting: Obstetrics and Gynecology

## 2018-05-17 DIAGNOSIS — J301 Allergic rhinitis due to pollen: Secondary | ICD-10-CM | POA: Diagnosis not present

## 2018-05-17 DIAGNOSIS — J3089 Other allergic rhinitis: Secondary | ICD-10-CM | POA: Diagnosis not present

## 2018-05-17 DIAGNOSIS — J3081 Allergic rhinitis due to animal (cat) (dog) hair and dander: Secondary | ICD-10-CM | POA: Diagnosis not present

## 2018-05-17 DIAGNOSIS — N898 Other specified noninflammatory disorders of vagina: Secondary | ICD-10-CM

## 2018-05-17 MED ORDER — MOXIFLOXACIN HCL 400 MG PO TABS: 400.0000 mg | ORAL_TABLET | Freq: Every day | ORAL | 0 refills | Status: DC

## 2018-05-17 NOTE — Telephone Encounter (Signed)
One Swab AV culture shows Entero faecalis again. No BV. Rx RF avelox. Pt's daughter notified. F/u prn.  Also has EMB appt sched with MD.

## 2018-05-19 DIAGNOSIS — Z23 Encounter for immunization: Secondary | ICD-10-CM | POA: Diagnosis not present

## 2018-05-23 ENCOUNTER — Encounter: Payer: Self-pay | Admitting: Family Medicine

## 2018-05-23 ENCOUNTER — Encounter: Payer: Self-pay | Admitting: Obstetrics and Gynecology

## 2018-05-23 ENCOUNTER — Ambulatory Visit (INDEPENDENT_AMBULATORY_CARE_PROVIDER_SITE_OTHER): Payer: Medicare Other | Admitting: Family Medicine

## 2018-05-23 VITALS — BP 124/68 | HR 89 | Temp 98.2°F | Ht 66.0 in | Wt 134.8 lb

## 2018-05-23 DIAGNOSIS — R21 Rash and other nonspecific skin eruption: Secondary | ICD-10-CM

## 2018-05-23 MED ORDER — TRIAMCINOLONE ACETONIDE 0.1 % EX CREA: 1.0000 "application " | TOPICAL_CREAM | Freq: Two times a day (BID) | CUTANEOUS | 0 refills | Status: DC

## 2018-05-23 MED ORDER — PREDNISONE 20 MG PO TABS: ORAL_TABLET | ORAL | 0 refills | Status: DC

## 2018-05-23 NOTE — Assessment & Plan Note (Signed)
Anticipate irritant dermatitis.  Predominantly to anterior upper chest below neckline.  May be avelox related, may be tide pod related (both are newest exposures). rec avoid both.  Denies recent sun exposure.  Rx triamcinolone cream to rash, provided with prednisone taper to fill if topical cream ineffective. Ok to conitnue benadryl salve. Update if not improving with treatment.

## 2018-05-23 NOTE — Patient Instructions (Addendum)
I think you have a irritant dermatitis, unclear source - either new detergent or antibiotic.  Avoid both. Treat with stronger topical steroid sent to pharmacy. If no better with this, may fill prednisone course sent to pharmacy today.  Let us know if not better with this.

## 2018-05-23 NOTE — Progress Notes (Signed)
BP 124/68 (BP Location: Left Arm, Patient Position: Sitting, Cuff Size: Normal)   Pulse 89   Temp 98.2 F (36.8 C) (Oral)   Ht 5\' 6"  (1.676 m)   Wt 134 lb 12 oz (61.1 kg)   SpO2 96%   BMI 21.75 kg/m    CC: rash Subjective:    Patient ID: [name] [name], female    DOB: 06/09/1924, 84 y.o.   MRN: [id number]  HPI: [name] [name] is a 84 y.o. female presenting on 05/23/2018 for Rash (C/o rash located on torso. Noticed 3-4 days ago. Rash itiches and burns. Pt was recently prescribed Avelox and pt thinks she may be having a reaction. Stopped it last night. Tried oral Benadryl and otc creams, not helpful. Pt accompanied by her daughter, [name] [name]. )   Here with daughter.   1 wk h/o rash on upper chest, throughout back, into shoulders - pruritic, burning pain worse at night time. Very painful. Has not been out in the sun.  No fevers/chills, nausea/vomiting, abd pain, joint pains, oral lesions.  No new lotions, soaps or shampoos.  She did recently switch from all to tide pods.  ?reaction to avelox - she had recently started on this by OBGYN for vaginal infection.  No new vitamins or supplements.   Tried benadryl both oral and salve and topical cortisone-10 without much benefit.   Relevant past medical, surgical, family and social history reviewed and updated as indicated. Interim medical history since our last visit reviewed. Allergies and medications reviewed and updated. Outpatient Medications Prior to Visit  Medication Sig Dispense Refill  . acetaminophen (TYLENOL) 650 MG CR tablet Take 650 mg by mouth 3 (three) times daily.    Marland Kitchen aspirin 81 MG tablet Take 81 mg by mouth daily.    . celecoxib (CELEBREX) 200 MG capsule TAKE 1 CAPSULE DAILY 90 capsule 3  . CVS VITAMIN D3 1000 UNITS capsule TAKE ONE CAPSULE BY MOUTH DAILY 30 capsule 0  . DETROL LA 4 MG 24 hr capsule TAKE 1 CAPSULE DAILY 90 capsule 3  . docusate sodium (STOOL SOFTENER) 100 MG capsule Take 100 mg by mouth daily as  needed for mild constipation.    . dorzolamide-timolol (COSOPT) 22.3-6.8 MG/ML ophthalmic solution Apply to eye.    . Lactobacillus (ACIDOPHILUS) 100 MG CAPS Take by mouth.    . latanoprost (XALATAN) 0.005 % ophthalmic solution     . Melatonin 10 MG TABS Take by mouth.    . metroNIDAZOLE (FLAGYL) 500 MG tablet TAKE 1 TABLET(500 MG) BY MOUTH TWICE DAILY 14 tablet 1  . Multiple Vitamin (MULTIVITAMIN) tablet Take 1 tablet by mouth daily.    . polyethylene glycol (MIRALAX / GLYCOLAX) packet Take 17 g by mouth daily as needed.    . Probiotic Product (PROBIOTIC ADVANCED PO) Take 1 capsule by mouth daily.    . Simethicone (GAS-X PO) Take by mouth 2 (two) times daily.    Marland Kitchen levocetirizine (XYZAL) 5 MG tablet Take 5 mg by mouth every evening.    . moxifloxacin (AVELOX) 400 MG tablet Take 1 tablet (400 mg total) by mouth daily for 6 days. (Patient not taking: Reported on 05/23/2018) 6 tablet 0   No facility-administered medications prior to visit.      Per HPI unless specifically indicated in ROS section below Review of Systems     Objective:    BP 124/68 (BP Location: Left Arm, Patient Position: Sitting, Cuff Size: Normal)   Pulse 89   Temp 98.2  F (36.8 C) (Oral)   Ht 5\' 6"  (1.676 m)   Wt 134 lb 12 oz (61.1 kg)   SpO2 96%   BMI 21.75 kg/m   Wt Readings from Last 3 Encounters:  05/23/18 134 lb 12 oz (61.1 kg)  05/09/18 139 lb (63 kg)  04/11/18 138 lb (62.6 kg)    Physical Exam  Constitutional: She appears well-developed and well-nourished. No distress.  Skin: Skin is warm and dry. Rash noted. There is erythema.     Anterior chest with pruritic and tender erythema without significant papules or vesicles Some erythematous SKs on back along with pruritis  Multiple benign appearing SKs on back Rash spares face, abdomen, extremities  Nursing note and vitals reviewed.     Assessment & Plan:   Problem List Items Addressed This Visit    Skin rash - Primary    Anticipate irritant  dermatitis.  Predominantly to anterior upper chest below neckline.  May be avelox related, may be tide pod related (both are newest exposures). rec avoid both.  Denies recent sun exposure.  Rx triamcinolone cream to rash, provided with prednisone taper to fill if topical cream ineffective. Ok to conitnue benadryl salve. Update if not improving with treatment.           Meds ordered this encounter  Medications  . predniSONE (DELTASONE) 20 MG tablet    Sig: Take two tablets daily for 3 days followed by one tablet daily for 4 days    Dispense:  10 tablet    Refill:  0  . triamcinolone cream (KENALOG) 0.1 %    Sig: Apply 1 application topically 2 (two) times daily. Apply to AA. No more than 2 weeks at a time    Dispense:  45 g    Refill:  0   No orders of the defined types were placed in this encounter.   Follow up plan: No follow-ups on file.  Ria Bush, MD

## 2018-05-24 ENCOUNTER — Telehealth: Payer: Self-pay | Admitting: Internal Medicine

## 2018-05-24 DIAGNOSIS — J301 Allergic rhinitis due to pollen: Secondary | ICD-10-CM | POA: Diagnosis not present

## 2018-05-24 DIAGNOSIS — J3089 Other allergic rhinitis: Secondary | ICD-10-CM | POA: Diagnosis not present

## 2018-05-24 DIAGNOSIS — J3081 Allergic rhinitis due to animal (cat) (dog) hair and dander: Secondary | ICD-10-CM | POA: Diagnosis not present

## 2018-05-24 NOTE — Telephone Encounter (Signed)
Last 2 OV notes faxed to [facility]

## 2018-05-24 NOTE — Telephone Encounter (Signed)
Copied from [place] [id number]. Topic: Quick Communication - See Telephone Encounter >> May 24, 2018  2:05 PM Percell Belt A wrote: CRM for notification. See Telephone encounter for: 05/24/18.  Debbie at [facility] assisted living called in and appt has gotten approved for an appartment and they are needing the last 2 or 3 office notes for those visits.     Fax number  [phone] Call back number [phone]

## 2018-05-27 ENCOUNTER — Encounter: Payer: Self-pay | Admitting: Obstetrics & Gynecology

## 2018-05-27 ENCOUNTER — Ambulatory Visit (INDEPENDENT_AMBULATORY_CARE_PROVIDER_SITE_OTHER): Payer: Medicare Other | Admitting: Obstetrics & Gynecology

## 2018-05-27 ENCOUNTER — Telehealth: Payer: Self-pay | Admitting: Internal Medicine

## 2018-05-27 VITALS — BP 130/80 | Ht 66.0 in | Wt 136.0 lb

## 2018-05-27 DIAGNOSIS — N362 Urethral caruncle: Secondary | ICD-10-CM | POA: Diagnosis not present

## 2018-05-27 DIAGNOSIS — N760 Acute vaginitis: Secondary | ICD-10-CM

## 2018-05-27 DIAGNOSIS — B9689 Other specified bacterial agents as the cause of diseases classified elsewhere: Secondary | ICD-10-CM

## 2018-05-27 DIAGNOSIS — N950 Postmenopausal bleeding: Secondary | ICD-10-CM

## 2018-05-27 MED ORDER — TRIAMCINOLONE ACETONIDE 0.1 % EX CREA: 1.0000 "application " | TOPICAL_CREAM | Freq: Two times a day (BID) | CUTANEOUS | 0 refills | Status: AC

## 2018-05-27 MED ORDER — CLINDAMYCIN HCL 300 MG PO CAPS: 300.0000 mg | ORAL_CAPSULE | Freq: Two times a day (BID) | ORAL | 0 refills | Status: AC

## 2018-05-27 NOTE — Telephone Encounter (Signed)
Kenalog cream last refilled # [id number] on 05/23/18; 05/23/18 pt saw Dr Darnell Level. [facility] [address], building services.

## 2018-05-27 NOTE — Telephone Encounter (Signed)
Patient advised and understands 

## 2018-05-27 NOTE — Progress Notes (Signed)
HPI:      [name] is a 84 y.o., presents today for a problem visit.  She complains of:  Vaginitis: Patient complains of an abnormal vaginal discharge that has continued and she could not take Flagyl due to itchy hives like reaction to it.  She still has discharge w odor.  She had also noted blood on pad at times.  No pain.  No recent trauma, sex, travel, use of blood thinner medicine.  PMHx: She  has a past medical history of Allergic rhinitis due to pollen, Aortic sclerosis (5/12), Arthritis, Diastolic dysfunction, History of seasonal allergies, Hyperlipidemia, Osteoarthritis, multiple sites, Osteoporosis, Overactive bladder, Urge incontinence, and Vaginal odor (04/2018). Also,  has a past surgical history that includes Inguinal hernia repair (2012); Umbilical hernia repair (02/04/06); Cystocele repair (2007); Tonsillectomy and adenoidectomy; Dilation and curettage of uterus; Total hip arthroplasty (Right, 05/23/2015); and Reverse shoulder arthroplasty (Right, 04/27/2017)., family history includes Cancer (age of onset: 75) in her father; Heart disease in her father.,  reports that she has quit smoking. She has never used smokeless tobacco. She reports that she drinks alcohol. She reports that she does not use drugs.  She has a current medication list which includes the following prescription(s): acetaminophen, aspirin, celecoxib, clindamycin, cvs vitamin d3, detrol la, docusate sodium, dorzolamide-timolol, acidophilus, latanoprost, melatonin, metronidazole, multivitamin, polyethylene glycol, prednisone, probiotic product, simethicone, and triamcinolone cream. Also, is allergic to demerol [meperidine] and shellfish allergy.  Review of Systems  All other systems reviewed and are negative.   Objective: BP 130/80   Ht 5\' 6"  (1.676 m)   Wt 136 lb (61.7 kg)   BMI 21.95 kg/m  Physical Exam  Constitutional: She is oriented to person, place, and time. She appears well-developed and  well-nourished. No distress.  Genitourinary: Vagina normal and uterus normal. Pelvic exam was performed with patient supine. There is no rash, tenderness or lesion on the right labia. There is no rash, tenderness or lesion on the left labia. No erythema or bleeding in the vagina. Right adnexum does not display mass and does not display tenderness. Left adnexum does not display mass and does not display tenderness. Cervix does not exhibit motion tenderness, discharge, polyp or nabothian cyst.   Uterus is mobile and midaxial. Uterus is not enlarged or exhibiting a mass.  Genitourinary Comments: Cervix small and indiscernable os Urethral polyp 0.3 mm  HENT:  Head: Normocephalic and atraumatic.  Nose: Nose normal.  Mouth/Throat: Oropharynx is clear and moist.  Abdominal: Soft. She exhibits no distension. There is no tenderness.  Musculoskeletal: Normal range of motion.  Neurological: She is alert and oriented to person, place, and time. No cranial nerve deficit.  Skin: Skin is warm and dry.  Psychiatric: She has a normal mood and affect.   ASSESSMENT/PLAN:    Problem List Items Addressed This Visit      Genitourinary   BV (bacterial vaginosis)    Change therapy to Clindamycin as did not complete Flagyl due to hives like reaction        Postmenopausal bleeding - Primary    EMB attempted, see below  Urethral Polyp    Small, may be cause for bleeding on pad at times    No need for excision at this time   A total of 15 minutes were spent face-to-face with the patient during this encounter and over half of that time dealt with counseling and coordination of care for bleeding, infection, and polyp.    Endometrial Biopsy After discussion  with the patient regarding her abnormal uterine bleeding I recommended that she proceed with an endometrial biopsy for further diagnosis. The risks, benefits, alternatives, and indications for an endometrial biopsy were discussed with the patient in detail.  She understood the risks including infection, bleeding, cervical laceration and uterine perforation.  Verbal consent was obtained.   PROCEDURE NOTE:  Pipelle endometrial biopsy was attempted using aseptic technique with iodine preparation. Unsuccessful due to small cervix, stenosis, and pt discomfort.  Barnett Applebaum, MD, [facility], [facility] 05/27/2018  10:57 AM

## 2018-05-27 NOTE — Patient Instructions (Signed)

## 2018-05-27 NOTE — Telephone Encounter (Signed)
[facility] Rx refill

## 2018-05-27 NOTE — Telephone Encounter (Signed)
Refilled. Limit use to 2 wks. Ensure to use only small amount at a time.

## 2018-05-27 NOTE — Telephone Encounter (Signed)
Copied from [place] [id number]. Topic: Quick Communication - Rx Refill/Question >> May 27, 2018  9:55 AM Blase Mess A wrote: Medication:triamcinolone cream (KENALOG) 0.1 % [[id number]- Prescribed to treat a rash.  The cream is helping the rash but the paitent is all out.  Has the patient contacted their pharmacy? Yes  (Agent: If no, request that the patient contact the pharmacy for the refill.) (Agent: If yes, when and what did the pharmacy advise?)  Preferred Pharmacy (with phone number or street name): [id number] - [address], [address] [place] [address] [place] [address] Phone: [phone] Fax: [phone]    Agent: Please be advised that RX refills may take up to 3 business days. We ask that you follow-up with your pharmacy.

## 2018-05-31 DIAGNOSIS — J301 Allergic rhinitis due to pollen: Secondary | ICD-10-CM | POA: Diagnosis not present

## 2018-05-31 DIAGNOSIS — J3081 Allergic rhinitis due to animal (cat) (dog) hair and dander: Secondary | ICD-10-CM | POA: Diagnosis not present

## 2018-05-31 DIAGNOSIS — J3089 Other allergic rhinitis: Secondary | ICD-10-CM | POA: Diagnosis not present

## 2018-06-06 ENCOUNTER — Ambulatory Visit (INDEPENDENT_AMBULATORY_CARE_PROVIDER_SITE_OTHER): Payer: Medicare Other | Admitting: Obstetrics and Gynecology

## 2018-06-06 ENCOUNTER — Encounter: Payer: Self-pay | Admitting: Obstetrics and Gynecology

## 2018-06-06 VITALS — BP 134/60 | HR 87 | Ht 66.0 in | Wt 136.0 lb

## 2018-06-06 DIAGNOSIS — N898 Other specified noninflammatory disorders of vagina: Secondary | ICD-10-CM | POA: Diagnosis not present

## 2018-06-06 LAB — POCT WET PREP WITH KOH
Clue Cells Wet Prep HPF POC: NEGATIVE
KOH Prep POC: NEGATIVE
TRICHOMONAS UA: NEGATIVE
YEAST WET PREP PER HPF POC: NEGATIVE

## 2018-06-06 NOTE — Patient Instructions (Signed)
I value your feedback and entrusting us with your care. If you get a [facility] patient survey, I would appreciate you taking the time to let us know about your experience today. Thank you! 

## 2018-06-06 NOTE — Progress Notes (Signed)
[name] Carbon, MD   Chief Complaint  Patient presents with  . Follow-up    discharge (color described as "rust") and odor, itchiness, no irritation at the moment    HPI:      [name] is a 84 y.o. No obstetric history on file. who LMP was No LMP recorded. Patient is postmenopausal., presents today for vag d/c f/u. Pt noted rust colored d/c a few days last wk that has since resolved. Dr. Kenton Kingfisher had tried EMB 05/27/18 for PMB but was unsuccessful. Urethral polyp evident and could be source of mild spotting. Plan was to watch pt sx expectantly. Pt still has intermittent vaginal itching, particularly a few days last wk. Sx resolved today. Has sour odor, not fishy. Uses water to wash, dryer sheets in underwear, and wears pantyliners frequently. Is taking probiotics. Not sex active.   Pt was seen 04/11/18 for several month hx of increased d/c and non-fishy odor, without irritation. Pt had been treated by PCP with flagyl without relief. One Swab culture 9/19 showed AV (Entero faecalis and E.Coli). Pt treated with avelox and then ampicillin with temporary relief, but sx recurred once abx stopped. Repeat One Swab showed Entero faecalis again and pt retreated with avelox. Sx have not fully resolved.   Neg pap 10/19.  Past Medical History:  Diagnosis Date  . Allergic rhinitis due to pollen    some shellfish also  . Aortic sclerosis 5/12   On echo. no stenosis  . Arthritis   . Diastolic dysfunction    stress echo otherwise normal 1/04. Repeat 5/11 also negative  . History of seasonal allergies   . Hyperlipidemia   . Osteoarthritis, multiple sites   . Osteoporosis   . Overactive bladder   . Urge incontinence   . Vaginal odor 04/2018   aerobic vaginitis on One Swab culture    Past Surgical History:  Procedure Laterality Date  . CYSTOCELE REPAIR  2007   and rectocele  . DILATION AND CURETTAGE OF UTERUS    . INGUINAL HERNIA REPAIR  2012   left side with mesh  .  REVERSE SHOULDER ARTHROPLASTY Right 04/27/2017   Procedure: REVERSE SHOULDER ARTHROPLASTY;  Surgeon: Corky Mull, MD;  Location: [facility] ORS;  Service: Orthopedics;  Laterality: Right;  . TONSILLECTOMY AND ADENOIDECTOMY     as child  . TOTAL HIP ARTHROPLASTY Right 05/23/2015   Procedure: TOTAL HIP ARTHROPLASTY ANTERIOR APPROACH;  Surgeon: Hessie Knows, MD;  Location: [facility] ORS;  Service: Orthopedics;  Laterality: Right;  . UMBILICAL HERNIA REPAIR  02/04/06    Family History  Problem Relation Age of Onset  . Cancer Father 54       colon cancer  . Heart disease Father     Social History   Socioeconomic History  . Marital status: Widowed    Spouse name: Not on file  . Number of children: 3  . Years of education: Not on file  . Highest education level: Not on file  Occupational History  . Occupation: Pharmacist, hospital    Comment: short time  . Occupation: Herbalist    Comment: most of career  Social Needs  . Financial resource strain: Not on file  . Food insecurity:    Worry: Not on file    Inability: Not on file  . Transportation needs:    Medical: Not on file    Non-medical: Not on file  Tobacco Use  . Smoking status: Former Research scientist (life sciences)  . Smokeless tobacco:  Never Used  Substance and Sexual Activity  . Alcohol use: Yes    Comment: wine before dinner  . Drug use: No  . Sexual activity: Not on file  Lifestyle  . Physical activity:    Days per week: Not on file    Minutes per session: Not on file  . Stress: Not on file  Relationships  . Social connections:    Talks on phone: Not on file    Gets together: Not on file    Attends religious service: Not on file    Active member of club or organization: Not on file    Attends meetings of clubs or organizations: Not on file    Relationship status: Not on file  . Intimate partner violence:    Fear of current or ex partner: Not on file    Emotionally abused: Not on file    Physically abused: Not on file    Forced sexual activity:  Not on file  Other Topics Concern  . Not on file  Social History Narrative   Widowed 2019   Has living will   Daughter [name], is health care POA   Would accept trial of resuscitation   Would probably accept feeding tube    Outpatient Medications Prior to Visit  Medication Sig Dispense Refill  . acetaminophen (TYLENOL) 650 MG CR tablet Take 650 mg by mouth 3 (three) times daily.    Marland Kitchen aspirin 81 MG tablet Take 81 mg by mouth daily.    . celecoxib (CELEBREX) 200 MG capsule TAKE 1 CAPSULE DAILY 90 capsule 3  . CVS VITAMIN D3 1000 UNITS capsule TAKE ONE CAPSULE BY MOUTH DAILY 30 capsule 0  . DETROL LA 4 MG 24 hr capsule TAKE 1 CAPSULE DAILY 90 capsule 3  . docusate sodium (STOOL SOFTENER) 100 MG capsule Take 100 mg by mouth daily as needed for mild constipation.    . dorzolamide-timolol (COSOPT) 22.3-6.8 MG/ML ophthalmic solution Apply to eye.    . Lactobacillus (ACIDOPHILUS) 100 MG CAPS Take by mouth.    . latanoprost (XALATAN) 0.005 % ophthalmic solution     . Melatonin 10 MG TABS Take by mouth.    . metroNIDAZOLE (FLAGYL) 500 MG tablet TAKE 1 TABLET(500 MG) BY MOUTH TWICE DAILY 14 tablet 1  . Multiple Vitamin (MULTIVITAMIN) tablet Take 1 tablet by mouth daily.    . polyethylene glycol (MIRALAX / GLYCOLAX) packet Take 17 g by mouth daily as needed.    . predniSONE (DELTASONE) 20 MG tablet Take two tablets daily for 3 days followed by one tablet daily for 4 days 10 tablet 0  . Probiotic Product (PROBIOTIC ADVANCED PO) Take 1 capsule by mouth daily.    . Simethicone (GAS-X PO) Take by mouth 2 (two) times daily.    Marland Kitchen triamcinolone cream (KENALOG) 0.1 % Apply 1 application topically 2 (two) times daily. Apply to AA. No more than 2 weeks at a time 80 g 0   No facility-administered medications prior to visit.       ROS:  Review of Systems  Constitutional: Negative for fatigue, fever and unexpected weight change.  Respiratory: Negative for cough, shortness of breath and wheezing.     Cardiovascular: Negative for chest pain, palpitations and leg swelling.  Gastrointestinal: Negative for blood in stool, constipation, diarrhea, nausea and vomiting.  Endocrine: Negative for cold intolerance, heat intolerance and polyuria.  Genitourinary: Positive for vaginal bleeding and vaginal discharge. Negative for dyspareunia, dysuria, flank pain, frequency, genital sores, hematuria,  menstrual problem, pelvic pain, urgency and vaginal pain.  Musculoskeletal: Negative for back pain, joint swelling and myalgias.  Skin: Negative for rash.  Neurological: Negative for dizziness, syncope, light-headedness, numbness and headaches.  Hematological: Negative for adenopathy.  Psychiatric/Behavioral: Negative for agitation, confusion, sleep disturbance and suicidal ideas. The patient is not nervous/anxious.     OBJECTIVE:   Vitals:  BP 134/60   Pulse 87   Ht 5\' 6"  (1.676 m)   Wt 136 lb (61.7 kg)   BMI 21.95 kg/m   Physical Exam  Constitutional: She is oriented to person, place, and time. Vital signs are normal. She appears well-developed.  Pulmonary/Chest: Effort normal.  Genitourinary: Vagina normal and uterus normal. There is no rash, tenderness or lesion on the right labia. There is no rash, tenderness or lesion on the left labia. Uterus is not enlarged and not tender. Cervix exhibits no motion tenderness. Right adnexum displays no mass and no tenderness. Left adnexum displays no mass and no tenderness. No erythema or tenderness in the vagina. No vaginal discharge found.  Musculoskeletal: Normal range of motion.  Neurological: She is alert and oriented to person, place, and time.  Psychiatric: She has a normal mood and affect. Her behavior is normal. Thought content normal.  Vitals reviewed.   Results: Results for orders placed or performed in visit on 06/06/18 (from the past 24 hour(s))  POCT Wet Prep with KOH     Status: Normal   Collection Time: 06/06/18  3:03 PM  Result Value Ref  Range   Trichomonas, UA Negative    Clue Cells Wet Prep HPF POC neg    Epithelial Wet Prep HPF POC     Yeast Wet Prep HPF POC neg    Bacteria Wet Prep HPF POC     RBC Wet Prep HPF POC     WBC Wet Prep HPF POC     KOH Prep POC Negative Negative     Assessment/Plan: Vaginal discharge - Neg wet prep/exam. D/C is clear. No evidence of rust color. Could have been spotting after EMB attempt. F/u prn.  - Plan: POCT Wet Prep with KOH  Vaginal irritation - No sx today. Neg wet prep. Question chem derm from pantyliners/dryer sheets. Try OTC hydrocortisone crm prn sx. Minimize pad use/line dry underwear. F/u prn.     Return if symptoms worsen or fail to improve.  Alicia B. Copland, PA-C 06/06/2018 3:07 PM

## 2018-06-07 DIAGNOSIS — J3081 Allergic rhinitis due to animal (cat) (dog) hair and dander: Secondary | ICD-10-CM | POA: Diagnosis not present

## 2018-06-07 DIAGNOSIS — J301 Allergic rhinitis due to pollen: Secondary | ICD-10-CM | POA: Diagnosis not present

## 2018-06-07 DIAGNOSIS — J3089 Other allergic rhinitis: Secondary | ICD-10-CM | POA: Diagnosis not present

## 2018-06-14 DIAGNOSIS — J3089 Other allergic rhinitis: Secondary | ICD-10-CM | POA: Diagnosis not present

## 2018-06-14 DIAGNOSIS — J301 Allergic rhinitis due to pollen: Secondary | ICD-10-CM | POA: Diagnosis not present

## 2018-06-14 DIAGNOSIS — J3081 Allergic rhinitis due to animal (cat) (dog) hair and dander: Secondary | ICD-10-CM | POA: Diagnosis not present

## 2018-06-20 IMAGING — MR MR SHOULDER*R* W/O CM
6 series · 40 of 40 positions shown · non-contrast
Comparison: None.

CLINICAL DATA: Chronic right shoulder pain and right arm pain.

EXAM:
MRI OF THE RIGHT SHOULDER WITHOUT CONTRAST
TECHNIQUE: Multiplanar, multisequence MR imaging of the shoulder was performed.
No intravenous contrast was administered.

[Series 3: T2 fat-sat · axial · 4.0mm · 0.47mm/px · z∈[-49,+29]mm · 7 of 21 slices shown (1 of 4)]
[im 1/21]
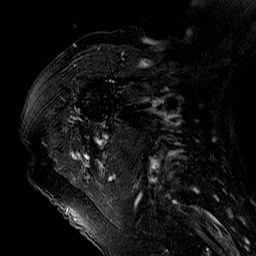
[im 4/21]
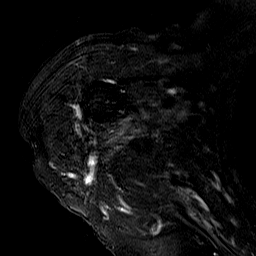
[im 7/21]
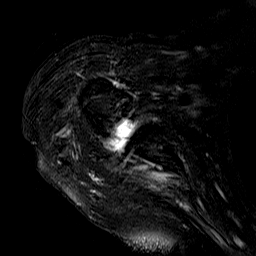
[im 11/21]
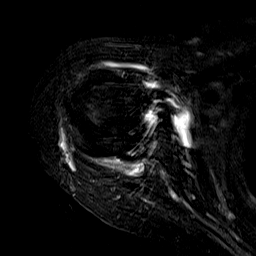
[im 14/21]
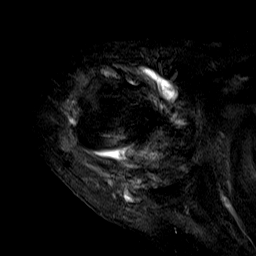
[im 17/21]
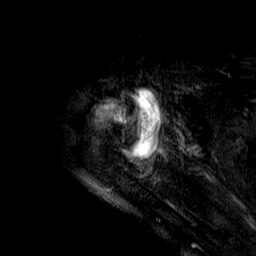
[im 21/21]
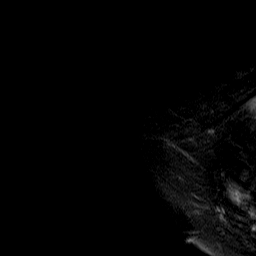

[Series 4: T2 fat-sat · oblique · 4.0mm · 0.62mm/px · 6 of 19 slices shown (2 of 4)]
[im 1/19]
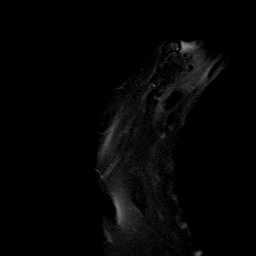
[im 4/19]
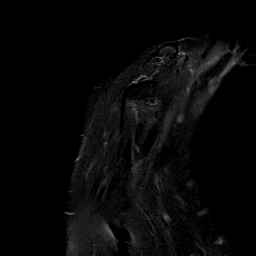
[im 8/19]
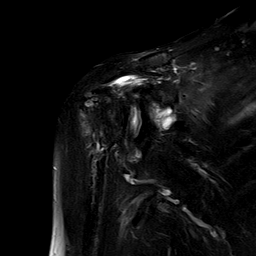
[im 11/19]
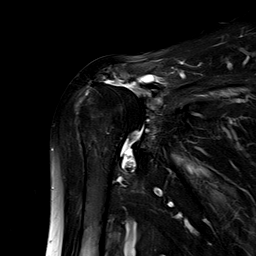
[im 15/19]
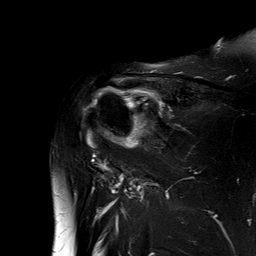
[im 19/19]
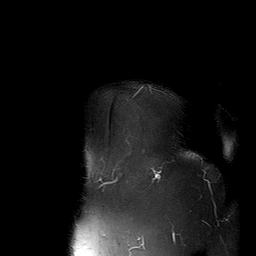

[Series 5: PD · oblique · 4.0mm · 0.62mm/px · 6 of 19 slices shown]
[im 1/19]
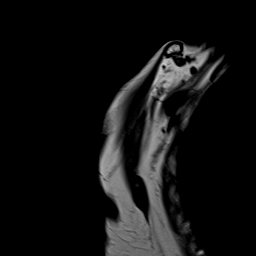
[im 4/19]
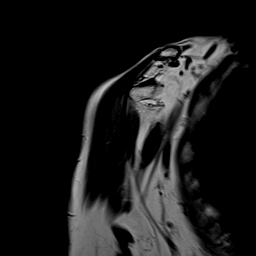
[im 8/19]
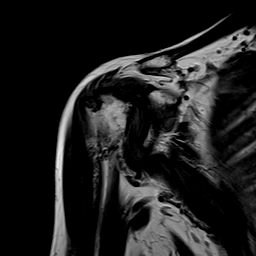
[im 11/19]
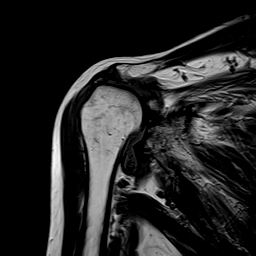
[im 15/19]
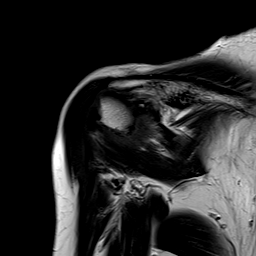
[im 19/19]
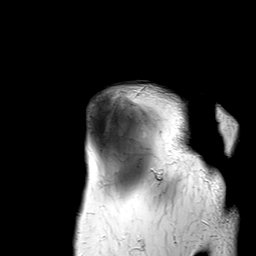

[Series 6: T1 · oblique · 4.0mm · 0.62mm/px · 7 of 21 slices shown]
[im 1/21]
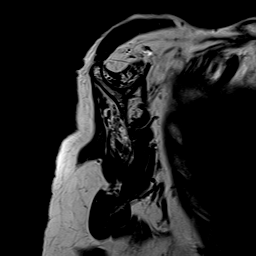
[im 4/21]
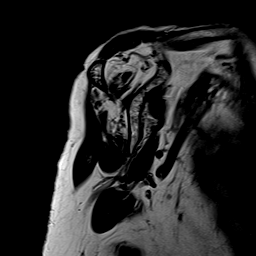
[im 7/21]
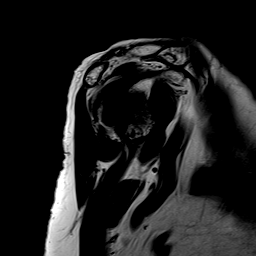
[im 11/21]
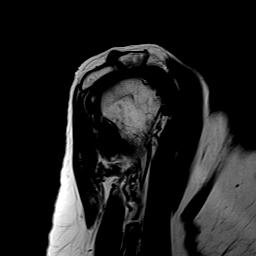
[im 14/21]
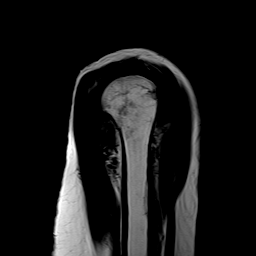
[im 17/21]
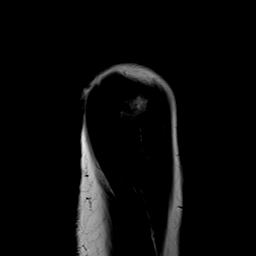
[im 21/21]
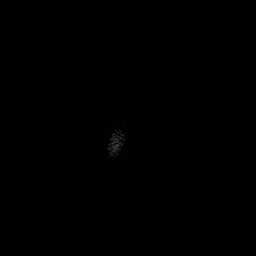

[Series 7: T2 fat-sat · oblique · 4.0mm · 0.62mm/px · 7 of 21 slices shown (3 of 4)]
[im 1/21]
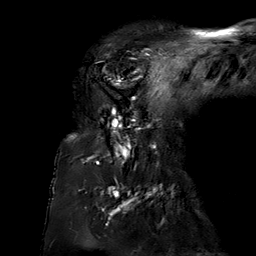
[im 4/21]
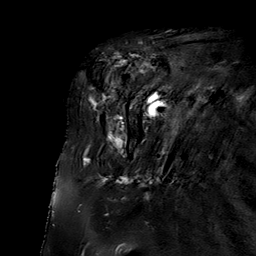
[im 7/21]
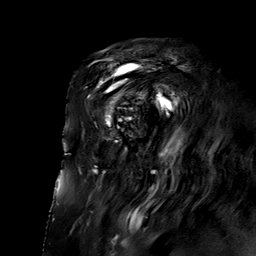
[im 11/21]
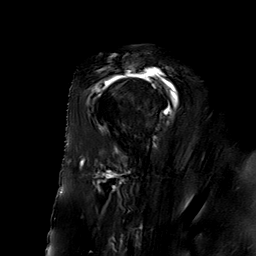
[im 14/21]
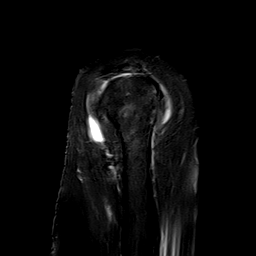
[im 17/21]
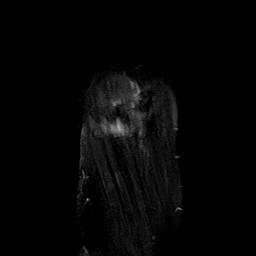
[im 21/21]
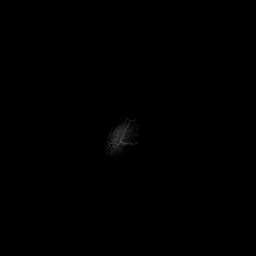

[Series 8: T2 fat-sat · axial · 4.0mm · 0.47mm/px · z∈[-49,+29]mm · 7 of 21 slices shown (4 of 4)]
[im 1/21]
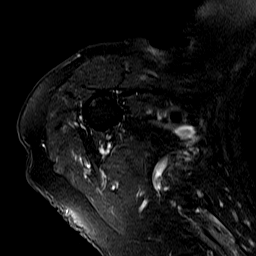
[im 4/21]
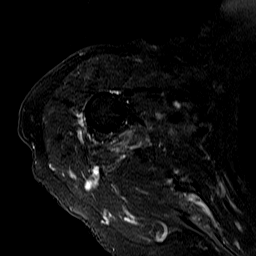
[im 7/21]
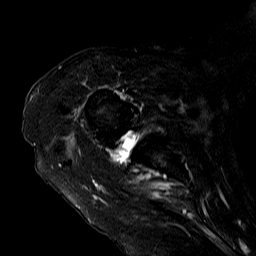
[im 11/21]
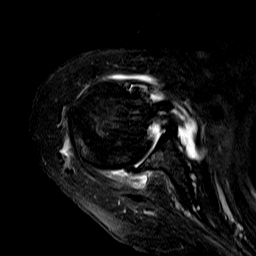
[im 14/21]
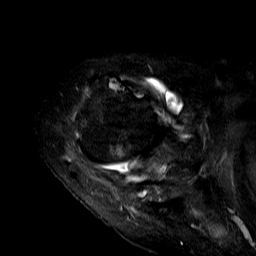
[im 17/21]
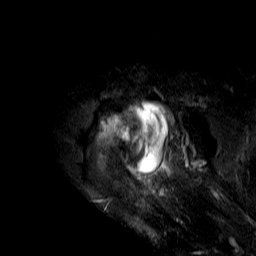
[im 21/21]
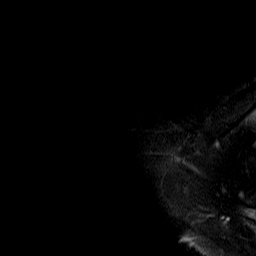

[40 of 40 positions shown; findings below may reference images not displayed]

FINDINGS: Limited examination due to significant patient motion.

Rotator cuff: Large retracted rotator cuff tear. The supraspinatus,
infraspinatus and subscapularis tendons are torn and retracted. The
teres minor tendon is still intact. A few wispy fibers of the
subscapularis tendon are intact.

Muscles:  Marked fatty atrophy of the rotator cuff muscles.

Biceps long head: Torn and retracted. A sizable portion of the
tendon is still attached to the biceps anchor.

Acromioclavicular Joint: Advanced degenerative changes with inferior
spurring from the distal clavicle. Type 1-2 acromion. Mild lateral
downsloping and undersurface spurring.

Glenohumeral Joint: Advanced degenerative changes with joint space
narrowing, cartilage loss, osteophytic spurring and subchondral
cystic change.

Large joint effusion and moderate synovitis.

Labrum:  The anterior and posterior labral are degenerated and torn.

Bones:  No acute bony findings.

Other: Expected fluid in the subacromial/subdeltoid bursa.
IMPRESSION: 1. Large full-thickness retracted rotator cuff tear as described
above.
2. Degenerated and torn anterior posterior labrum.
3. Torn and retracted long head biceps tendon.
4. Advanced glenohumeral joint degenerative changes.

## 2018-06-21 DIAGNOSIS — J301 Allergic rhinitis due to pollen: Secondary | ICD-10-CM | POA: Diagnosis not present

## 2018-06-21 DIAGNOSIS — J3081 Allergic rhinitis due to animal (cat) (dog) hair and dander: Secondary | ICD-10-CM | POA: Diagnosis not present

## 2018-06-21 DIAGNOSIS — J3089 Other allergic rhinitis: Secondary | ICD-10-CM | POA: Diagnosis not present

## 2018-06-22 DIAGNOSIS — L812 Freckles: Secondary | ICD-10-CM | POA: Diagnosis not present

## 2018-06-22 DIAGNOSIS — L821 Other seborrheic keratosis: Secondary | ICD-10-CM | POA: Diagnosis not present

## 2018-06-22 DIAGNOSIS — D225 Melanocytic nevi of trunk: Secondary | ICD-10-CM | POA: Diagnosis not present

## 2018-06-22 DIAGNOSIS — D2230 Melanocytic nevi of unspecified part of face: Secondary | ICD-10-CM | POA: Diagnosis not present

## 2018-06-22 DIAGNOSIS — L578 Other skin changes due to chronic exposure to nonionizing radiation: Secondary | ICD-10-CM | POA: Diagnosis not present

## 2018-06-22 DIAGNOSIS — L820 Inflamed seborrheic keratosis: Secondary | ICD-10-CM | POA: Diagnosis not present

## 2018-06-22 DIAGNOSIS — L570 Actinic keratosis: Secondary | ICD-10-CM | POA: Diagnosis not present

## 2018-06-22 DIAGNOSIS — D229 Melanocytic nevi, unspecified: Secondary | ICD-10-CM | POA: Diagnosis not present

## 2018-06-22 DIAGNOSIS — Z85828 Personal history of other malignant neoplasm of skin: Secondary | ICD-10-CM | POA: Diagnosis not present

## 2018-06-28 DIAGNOSIS — J3081 Allergic rhinitis due to animal (cat) (dog) hair and dander: Secondary | ICD-10-CM | POA: Diagnosis not present

## 2018-06-28 DIAGNOSIS — J301 Allergic rhinitis due to pollen: Secondary | ICD-10-CM | POA: Diagnosis not present

## 2018-06-28 DIAGNOSIS — J3089 Other allergic rhinitis: Secondary | ICD-10-CM | POA: Diagnosis not present

## 2018-07-05 DIAGNOSIS — J301 Allergic rhinitis due to pollen: Secondary | ICD-10-CM | POA: Diagnosis not present

## 2018-07-05 DIAGNOSIS — J3089 Other allergic rhinitis: Secondary | ICD-10-CM | POA: Diagnosis not present

## 2018-07-05 DIAGNOSIS — J3081 Allergic rhinitis due to animal (cat) (dog) hair and dander: Secondary | ICD-10-CM | POA: Diagnosis not present

## 2018-07-12 DIAGNOSIS — J3089 Other allergic rhinitis: Secondary | ICD-10-CM | POA: Diagnosis not present

## 2018-07-12 DIAGNOSIS — J3081 Allergic rhinitis due to animal (cat) (dog) hair and dander: Secondary | ICD-10-CM | POA: Diagnosis not present

## 2018-07-12 DIAGNOSIS — J301 Allergic rhinitis due to pollen: Secondary | ICD-10-CM | POA: Diagnosis not present

## 2018-07-13 DIAGNOSIS — J3089 Other allergic rhinitis: Secondary | ICD-10-CM | POA: Diagnosis not present

## 2018-07-13 DIAGNOSIS — J3081 Allergic rhinitis due to animal (cat) (dog) hair and dander: Secondary | ICD-10-CM | POA: Diagnosis not present

## 2018-07-19 DIAGNOSIS — J3089 Other allergic rhinitis: Secondary | ICD-10-CM | POA: Diagnosis not present

## 2018-07-19 DIAGNOSIS — J3081 Allergic rhinitis due to animal (cat) (dog) hair and dander: Secondary | ICD-10-CM | POA: Diagnosis not present

## 2018-07-19 DIAGNOSIS — J301 Allergic rhinitis due to pollen: Secondary | ICD-10-CM | POA: Diagnosis not present

## 2018-07-26 DIAGNOSIS — J3089 Other allergic rhinitis: Secondary | ICD-10-CM | POA: Diagnosis not present

## 2018-07-26 DIAGNOSIS — J301 Allergic rhinitis due to pollen: Secondary | ICD-10-CM | POA: Diagnosis not present

## 2018-07-26 DIAGNOSIS — J3081 Allergic rhinitis due to animal (cat) (dog) hair and dander: Secondary | ICD-10-CM | POA: Diagnosis not present

## 2018-08-02 DIAGNOSIS — J3081 Allergic rhinitis due to animal (cat) (dog) hair and dander: Secondary | ICD-10-CM | POA: Diagnosis not present

## 2018-08-02 DIAGNOSIS — J301 Allergic rhinitis due to pollen: Secondary | ICD-10-CM | POA: Diagnosis not present

## 2018-08-02 DIAGNOSIS — J3089 Other allergic rhinitis: Secondary | ICD-10-CM | POA: Diagnosis not present

## 2018-08-15 DIAGNOSIS — H401132 Primary open-angle glaucoma, bilateral, moderate stage: Secondary | ICD-10-CM | POA: Diagnosis not present

## 2018-08-16 DIAGNOSIS — J301 Allergic rhinitis due to pollen: Secondary | ICD-10-CM | POA: Diagnosis not present

## 2018-08-16 DIAGNOSIS — J3089 Other allergic rhinitis: Secondary | ICD-10-CM | POA: Diagnosis not present

## 2018-08-16 DIAGNOSIS — H401132 Primary open-angle glaucoma, bilateral, moderate stage: Secondary | ICD-10-CM | POA: Diagnosis not present

## 2018-08-16 DIAGNOSIS — J3081 Allergic rhinitis due to animal (cat) (dog) hair and dander: Secondary | ICD-10-CM | POA: Diagnosis not present

## 2018-08-18 ENCOUNTER — Encounter: Payer: Self-pay | Admitting: Internal Medicine

## 2018-08-18 ENCOUNTER — Ambulatory Visit: Payer: Medicare Other | Admitting: Internal Medicine

## 2018-08-18 VITALS — BP 156/80 | HR 75 | Temp 98.1°F | Resp 18 | Wt 142.0 lb

## 2018-08-18 DIAGNOSIS — M150 Primary generalized (osteo)arthritis: Secondary | ICD-10-CM

## 2018-08-18 DIAGNOSIS — N3941 Urge incontinence: Secondary | ICD-10-CM

## 2018-08-18 DIAGNOSIS — I5189 Other ill-defined heart diseases: Secondary | ICD-10-CM

## 2018-08-18 DIAGNOSIS — M159 Polyosteoarthritis, unspecified: Secondary | ICD-10-CM

## 2018-08-18 DIAGNOSIS — R194 Change in bowel habit: Secondary | ICD-10-CM | POA: Diagnosis not present

## 2018-08-18 DIAGNOSIS — N760 Acute vaginitis: Secondary | ICD-10-CM

## 2018-08-18 DIAGNOSIS — B9689 Other specified bacterial agents as the cause of diseases classified elsewhere: Secondary | ICD-10-CM | POA: Diagnosis not present

## 2018-08-18 DIAGNOSIS — K5909 Other constipation: Secondary | ICD-10-CM | POA: Insufficient documentation

## 2018-08-18 DIAGNOSIS — R198 Other specified symptoms and signs involving the digestive system and abdomen: Secondary | ICD-10-CM

## 2018-08-18 NOTE — Assessment & Plan Note (Signed)
Mild persistent symptoms On daily flagyl

## 2018-08-18 NOTE — Assessment & Plan Note (Signed)
No CHF or other symptoms

## 2018-08-18 NOTE — Assessment & Plan Note (Signed)
Worsened off the detrol, so back on Could be adding to stool problems if she is constipated

## 2018-08-18 NOTE — Assessment & Plan Note (Signed)
Has been going on for a long time----likely a form of IBS Appetite good and gaining weight---so doubt IBD I am concerned about obstipation with loose stools due to that Will check KUB to assess stool burden Increase the metamucil which seems to help

## 2018-08-18 NOTE — Progress Notes (Signed)
Subjective:    Patient ID: [name] [name], female    DOB: 1924/01/20, 85 y.o.   MRN: [id number]  HPI Initial visit in her assisted living apartment since moving here Reviewed status with Luellen Pucker RN  She feels she adjusted here right away Happy she moved here  Has been having problems with her bowels for 2-3 months Either constipation or diarrhea Pains in stomach----"almost doubles over". Mostly during moving bowels--then better No pain with eating Did notice blood on the toilet paper once or twice Still has some fecal leakage She does feel like she clears out--but unpredictable Using metamucil---may help some Has had some chronic issues with her bowels ---?IBS  Still on detrol Tried without it----and the incontinence got worse Just uses a pad  No chest pain No dizziness or syncope Some general weakness---has slacked off on the exercise Walking with walker now for   Current Outpatient Medications on File Prior to Visit  Medication Sig Dispense Refill  . acetaminophen (TYLENOL) 650 MG CR tablet Take 650 mg by mouth 3 (three) times daily.    Marland Kitchen aspirin 81 MG tablet Take 81 mg by mouth daily.    . celecoxib (CELEBREX) 200 MG capsule TAKE 1 CAPSULE DAILY 90 capsule 3  . CVS VITAMIN D3 1000 UNITS capsule TAKE ONE CAPSULE BY MOUTH DAILY 30 capsule 0  . DETROL LA 4 MG 24 hr capsule TAKE 1 CAPSULE DAILY 90 capsule 3  . dorzolamide-timolol (COSOPT) 22.3-6.8 MG/ML ophthalmic solution Apply to eye.    . latanoprost (XALATAN) 0.005 % ophthalmic solution     . Melatonin 10 MG TABS Take by mouth.    . metroNIDAZOLE (FLAGYL) 500 MG tablet Take 500 mg by mouth daily.    . Multiple Vitamin (MULTIVITAMIN) tablet Take 1 tablet by mouth daily.    . polyethylene glycol (MIRALAX / GLYCOLAX) packet Take 17 g by mouth daily as needed.    . Probiotic Product (PROBIOTIC ADVANCED PO) Take 1 capsule by mouth daily.    . psyllium (METAMUCIL SMOOTH TEXTURE) 28 % packet Take 1 packet by mouth daily.     . Simethicone (GAS-X PO) Take by mouth 2 (two) times daily.    . timolol (BETIMOL) 0.5 % ophthalmic solution Place 1 drop into both eyes daily.    Marland Kitchen triamcinolone cream (KENALOG) 0.1 % Apply 1 application topically 2 (two) times daily. Apply to AA. No more than 2 weeks at a time 80 g 0   No current facility-administered medications on file prior to visit.     Allergies  Allergen Reactions  . Demerol [Meperidine]     dizzy  . Shellfish Allergy   . Avelox [Moxifloxacin Hcl In Nacl] Rash    Past Medical History:  Diagnosis Date  . Allergic rhinitis due to pollen    some shellfish also  . Aortic sclerosis 5/12   On echo. no stenosis  . Arthritis   . Diastolic dysfunction    stress echo otherwise normal 1/04. Repeat 5/11 also negative  . History of seasonal allergies   . Hyperlipidemia   . Osteoarthritis, multiple sites   . Osteoporosis   . Overactive bladder   . Urge incontinence   . Vaginal odor 04/2018   aerobic vaginitis on One Swab culture    Past Surgical History:  Procedure Laterality Date  . CYSTOCELE REPAIR  2007   and rectocele  . DILATION AND CURETTAGE OF UTERUS    . INGUINAL HERNIA REPAIR  2012   left side  with mesh  . REVERSE SHOULDER ARTHROPLASTY Right 04/27/2017   Procedure: REVERSE SHOULDER ARTHROPLASTY;  Surgeon: Corky Mull, MD;  Location: [facility];  Service: Orthopedics;  Laterality: Right;  . TONSILLECTOMY AND ADENOIDECTOMY     as child  . TOTAL HIP ARTHROPLASTY Right 05/23/2015   Procedure: TOTAL HIP ARTHROPLASTY ANTERIOR APPROACH;  Surgeon: Hessie Knows, MD;  Location: [facility];  Service: Orthopedics;  Laterality: Right;  . UMBILICAL HERNIA REPAIR  02/04/06    Family History  Problem Relation Age of Onset  . Cancer Father 86       colon cancer  . Heart disease Father     Social History   Socioeconomic History  . Marital status: Widowed    Spouse name: Not on file  . Number of children: 3  . Years of education: Not on file  . Highest  education level: Not on file  Occupational History  . Occupation: Pharmacist, hospital    Comment: short time  . Occupation: Herbalist    Comment: most of career  Social Needs  . Financial resource strain: Not on file  . Food insecurity:    Worry: Not on file    Inability: Not on file  . Transportation needs:    Medical: Not on file    Non-medical: Not on file  Tobacco Use  . Smoking status: Former Research scientist (life sciences)  . Smokeless tobacco: Never Used  Substance and Sexual Activity  . Alcohol use: Yes    Comment: wine before dinner  . Drug use: No  . Sexual activity: Not on file  Lifestyle  . Physical activity:    Days per week: Not on file    Minutes per session: Not on file  . Stress: Not on file  Relationships  . Social connections:    Talks on phone: Not on file    Gets together: Not on file    Attends religious service: Not on file    Active member of club or organization: Not on file    Attends meetings of clubs or organizations: Not on file    Relationship status: Not on file  . Intimate partner violence:    Fear of current or ex partner: Not on file    Emotionally abused: Not on file    Physically abused: Not on file    Forced sexual activity: Not on file  Other Topics Concern  . Not on file  Social History Narrative   Widowed 2019   Has living will   Daughter [name], is health care POA   Would accept trial of resuscitation   Would probably accept feeding tube   Review of Systems Eating well Has gained some weight Sleeps okay most of the time---up once for bowels.  Uses the melatonin only prn Still with the mild vaginal symptoms--continues on the metronidazole daily Ongoing mild arthritis ---most difficult trying to get up. Some back ache. Takes tylenol as needed    Objective:   Physical Exam  Constitutional: She appears well-developed. No distress.  Neck: No thyromegaly present.  Cardiovascular: Normal rate, regular rhythm and normal heart sounds. Exam reveals no  gallop.  No murmur heard. Respiratory: Effort normal and breath sounds normal. No respiratory distress. She has no wheezes. She has no rales.  GI: Soft. Bowel sounds are normal. She exhibits no distension. There is no rebound and no guarding.  Mild periumbilical tenderness  Musculoskeletal:        General: No tenderness or edema.  Lymphadenopathy:  She has no cervical adenopathy.  Psychiatric: She has a normal mood and affect. Her behavior is normal.           Assessment & Plan:

## 2018-08-18 NOTE — Assessment & Plan Note (Signed)
Mild  Uses the tylenol prn Suggested she take it more regularly, especially at night

## 2018-08-23 DIAGNOSIS — J301 Allergic rhinitis due to pollen: Secondary | ICD-10-CM | POA: Diagnosis not present

## 2018-08-23 DIAGNOSIS — J3089 Other allergic rhinitis: Secondary | ICD-10-CM | POA: Diagnosis not present

## 2018-08-23 DIAGNOSIS — J3081 Allergic rhinitis due to animal (cat) (dog) hair and dander: Secondary | ICD-10-CM | POA: Diagnosis not present

## 2018-08-30 DIAGNOSIS — J301 Allergic rhinitis due to pollen: Secondary | ICD-10-CM | POA: Diagnosis not present

## 2018-08-30 DIAGNOSIS — J3089 Other allergic rhinitis: Secondary | ICD-10-CM | POA: Diagnosis not present

## 2018-08-30 DIAGNOSIS — J3081 Allergic rhinitis due to animal (cat) (dog) hair and dander: Secondary | ICD-10-CM | POA: Diagnosis not present

## 2018-09-06 DIAGNOSIS — J3081 Allergic rhinitis due to animal (cat) (dog) hair and dander: Secondary | ICD-10-CM | POA: Diagnosis not present

## 2018-09-06 DIAGNOSIS — J301 Allergic rhinitis due to pollen: Secondary | ICD-10-CM | POA: Diagnosis not present

## 2018-09-06 DIAGNOSIS — J3089 Other allergic rhinitis: Secondary | ICD-10-CM | POA: Diagnosis not present

## 2018-09-13 DIAGNOSIS — J3081 Allergic rhinitis due to animal (cat) (dog) hair and dander: Secondary | ICD-10-CM | POA: Diagnosis not present

## 2018-09-13 DIAGNOSIS — J3089 Other allergic rhinitis: Secondary | ICD-10-CM | POA: Diagnosis not present

## 2018-09-13 DIAGNOSIS — J301 Allergic rhinitis due to pollen: Secondary | ICD-10-CM | POA: Diagnosis not present

## 2018-09-20 ENCOUNTER — Ambulatory Visit: Payer: Medicare Other | Admitting: Obstetrics and Gynecology

## 2018-09-20 DIAGNOSIS — J3081 Allergic rhinitis due to animal (cat) (dog) hair and dander: Secondary | ICD-10-CM | POA: Diagnosis not present

## 2018-09-20 DIAGNOSIS — J301 Allergic rhinitis due to pollen: Secondary | ICD-10-CM | POA: Diagnosis not present

## 2018-09-20 DIAGNOSIS — J3089 Other allergic rhinitis: Secondary | ICD-10-CM | POA: Diagnosis not present

## 2018-09-23 DIAGNOSIS — J301 Allergic rhinitis due to pollen: Secondary | ICD-10-CM | POA: Diagnosis not present

## 2018-09-26 ENCOUNTER — Ambulatory Visit: Payer: Medicare Other | Admitting: Obstetrics & Gynecology

## 2018-09-29 DIAGNOSIS — J301 Allergic rhinitis due to pollen: Secondary | ICD-10-CM | POA: Diagnosis not present

## 2018-09-29 DIAGNOSIS — J3089 Other allergic rhinitis: Secondary | ICD-10-CM | POA: Diagnosis not present

## 2018-09-29 DIAGNOSIS — J3081 Allergic rhinitis due to animal (cat) (dog) hair and dander: Secondary | ICD-10-CM | POA: Diagnosis not present

## 2018-09-30 ENCOUNTER — Ambulatory Visit (INDEPENDENT_AMBULATORY_CARE_PROVIDER_SITE_OTHER): Payer: Medicare Other | Admitting: Obstetrics and Gynecology

## 2018-09-30 ENCOUNTER — Ambulatory Visit (INDEPENDENT_AMBULATORY_CARE_PROVIDER_SITE_OTHER): Payer: Medicare Other

## 2018-09-30 ENCOUNTER — Encounter: Payer: Self-pay | Admitting: Obstetrics and Gynecology

## 2018-09-30 VITALS — BP 144/80 | Ht 65.0 in | Wt 146.0 lb

## 2018-09-30 DIAGNOSIS — N950 Postmenopausal bleeding: Secondary | ICD-10-CM | POA: Diagnosis not present

## 2018-09-30 NOTE — Progress Notes (Signed)
Patient ID: [name] [name], female   DOB: [birth year], 85 y.o.   MRN: [id number]  Reason for Consult: Abnormal bleeding (Bleed x1 year, non stop, some odor, rusty color discharge )   Referred by Venia Carbon, MD  Subjective:     HPI:  [name] [name] is a 85 y.o. female . She is present today with her daughter for continued complaints of vaginal spotting and rust colored vaginal discharge which has been persistent despite several rounds of antibiotics. She is otherwise feeling well.  She has been seen previously for this issue by Dr. Kenton Kingfisher and Elmo Putt Copland.   Past Medical History:  Diagnosis Date  . Allergic rhinitis due to pollen    some shellfish also  . Aortic sclerosis 5/12   On echo. no stenosis  . Arthritis   . Diastolic dysfunction    stress echo otherwise normal 1/04. Repeat 5/11 also negative  . History of seasonal allergies   . Hyperlipidemia   . Osteoarthritis, multiple sites   . Osteoporosis   . Overactive bladder   . Urge incontinence   . Vaginal odor 04/2018   aerobic vaginitis on One Swab culture   Family History  Problem Relation Age of Onset  . Cancer Father 66       colon cancer  . Heart disease Father    Past Surgical History:  Procedure Laterality Date  . CYSTOCELE REPAIR  2007   and rectocele  . DILATION AND CURETTAGE OF UTERUS    . INGUINAL HERNIA REPAIR  2012   left side with mesh  . REVERSE SHOULDER ARTHROPLASTY Right 04/27/2017   Procedure: REVERSE SHOULDER ARTHROPLASTY;  Surgeon: Corky Mull, MD;  Location: [facility];  Service: Orthopedics;  Laterality: Right;  . TONSILLECTOMY AND ADENOIDECTOMY     as child  . TOTAL HIP ARTHROPLASTY Right 05/23/2015   Procedure: TOTAL HIP ARTHROPLASTY ANTERIOR APPROACH;  Surgeon: Hessie Knows, MD;  Location: [facility];  Service: Orthopedics;  Laterality: Right;  . UMBILICAL HERNIA REPAIR  02/04/06    Short Social History:  Social History   Tobacco Use  . Smoking status: Former Research scientist (life sciences)    . Smokeless tobacco: Never Used  Substance Use Topics  . Alcohol use: Yes    Comment: wine before dinner    Allergies  Allergen Reactions  . Demerol [Meperidine]     dizzy  . Shellfish Allergy   . Avelox [Moxifloxacin Hcl In Nacl] Rash    Current Outpatient Medications  Medication Sig Dispense Refill  . acetaminophen (TYLENOL) 650 MG CR tablet Take 650 mg by mouth 3 (three) times daily.    Marland Kitchen aspirin 81 MG tablet Take 81 mg by mouth daily.    . celecoxib (CELEBREX) 200 MG capsule TAKE 1 CAPSULE DAILY 90 capsule 3  . CVS VITAMIN D3 1000 UNITS capsule TAKE ONE CAPSULE BY MOUTH DAILY 30 capsule 0  . DETROL LA 4 MG 24 hr capsule TAKE 1 CAPSULE DAILY 90 capsule 3  . dorzolamide-timolol (COSOPT) 22.3-6.8 MG/ML ophthalmic solution Apply to eye.    . latanoprost (XALATAN) 0.005 % ophthalmic solution     . Melatonin 10 MG TABS Take by mouth.    . Multiple Vitamin (MULTIVITAMIN) tablet Take 1 tablet by mouth daily.    . Probiotic Product (PROBIOTIC ADVANCED PO) Take 1 capsule by mouth daily.    . psyllium (METAMUCIL SMOOTH TEXTURE) 28 % packet Take 1 packet by mouth daily.    . Simethicone (GAS-X PO) Take by  mouth 2 (two) times daily.    . metroNIDAZOLE (FLAGYL) 500 MG tablet Take 500 mg by mouth daily.    . polyethylene glycol (MIRALAX / GLYCOLAX) packet Take 17 g by mouth daily as needed.    . triamcinolone cream (KENALOG) 0.1 % Apply 1 application topically 2 (two) times daily. Apply to AA. No more than 2 weeks at a time (Patient not taking: Reported on 09/30/2018) 80 g 0   No current facility-administered medications for this visit.     Review of Systems  Constitutional: Negative for chills, fatigue, fever and unexpected weight change.  HENT: Negative for trouble swallowing.  Eyes: Negative for loss of vision.  Respiratory: Negative for cough, shortness of breath and wheezing.  Cardiovascular: Negative for chest pain, leg swelling, palpitations and syncope.  GI: Negative for  abdominal pain, blood in stool, diarrhea, nausea and vomiting.  GU: Negative for difficulty urinating, dysuria, frequency and hematuria.  Musculoskeletal: Negative for back pain, leg pain and joint pain.  Skin: Negative for rash.  Neurological: Negative for dizziness, headaches, light-headedness, numbness and seizures.  Psychiatric: Negative for behavioral problem, confusion, depressed mood and sleep disturbance.      Objective:  Objective   Vitals:   09/30/18 1047  BP: (!) 144/80  Weight: 146 lb (66.2 kg)  Height: 5\' 5"  (1.651 m)   Body mass index is 24.3 kg/m.  Physical Exam Vitals signs and nursing note reviewed.  Constitutional:      Appearance: She is well-developed.  HENT:     Head: Normocephalic and atraumatic.  Eyes:     Pupils: Pupils are equal, round, and reactive to light.  Cardiovascular:     Rate and Rhythm: Normal rate and regular rhythm.  Pulmonary:     Effort: Pulmonary effort is normal. No respiratory distress.  Genitourinary:    Comments: Very limited examination, used smallest virginal speculum. Unable to clearly visualize cervix. Unable to perform endometrial biopsy or pass biopsy tool by feel. Vagina only big enough for a 1 finger examination.  No significant vaginal discharge present in the vaginal vault. Mobile pelvis. No abdominal masses. No vaginal bleeding.  Small urethral diverticulum  Skin:    General: Skin is warm and dry.  Neurological:     Mental Status: She is alert and oriented to person, place, and time.  Psychiatric:        Behavior: Behavior normal.        Thought Content: Thought content normal.        Judgment: Judgment normal.       Assessment/Plan:    85 yo with vaginal discharge.  Spoke at length with patient and her daughter about how the uterine fluid collection may be from cervical stenosis or may be the result of a pyometrium. The fluid in the uterus is clear and not complex which favors a non infectious origin.  We  discussed how pyometrium is often associated with a cancer diagnosis. The patient and her daughter agree that if this was cancerous that they would not want to pursue surgery or chemotherapy.  She has not had unexplained sepsis or abdominal pain.   Discussed the MDL lab finding of enterococcus and e.coli and how this is a common finding in women who are postmenopausal. Discussed iodine douches as an option for reducing discharge. However, the patient is living at an assisted living facility where the staff would likely not be comfortable with this. Also, her daughter lives an hour away in [place]. The patient herself  would likely not be able to perform the douche either.   We decided to repeat the transvaginal US today and on a yearly basis to monitor the fluid collection. Today the ultrasound is largely unchanged since October.   More than 30 minutes were spent face to face with the patient in the room with more than 50% of the time spent providing counseling and discussing the plan of management.   Adrian Prows MD [facility], [facility] 09/30/2018 6:27 PM

## 2018-10-04 DIAGNOSIS — J301 Allergic rhinitis due to pollen: Secondary | ICD-10-CM | POA: Diagnosis not present

## 2018-10-04 DIAGNOSIS — J3081 Allergic rhinitis due to animal (cat) (dog) hair and dander: Secondary | ICD-10-CM | POA: Diagnosis not present

## 2018-10-04 DIAGNOSIS — J3089 Other allergic rhinitis: Secondary | ICD-10-CM | POA: Diagnosis not present

## 2018-10-11 DIAGNOSIS — J3081 Allergic rhinitis due to animal (cat) (dog) hair and dander: Secondary | ICD-10-CM | POA: Diagnosis not present

## 2018-10-11 DIAGNOSIS — J3089 Other allergic rhinitis: Secondary | ICD-10-CM | POA: Diagnosis not present

## 2018-10-11 DIAGNOSIS — J301 Allergic rhinitis due to pollen: Secondary | ICD-10-CM | POA: Diagnosis not present

## 2018-10-28 NOTE — Progress Notes (Signed)
Did you have an opportunity to ask Dr. Theora Gianotti about this case? Thank  you, Dr. Gilman Schmidt

## 2018-11-30 ENCOUNTER — Encounter: Payer: Self-pay | Admitting: Internal Medicine

## 2018-11-30 ENCOUNTER — Ambulatory Visit: Payer: Medicare Other | Admitting: Internal Medicine

## 2018-11-30 DIAGNOSIS — M150 Primary generalized (osteo)arthritis: Secondary | ICD-10-CM | POA: Diagnosis not present

## 2018-11-30 DIAGNOSIS — N3941 Urge incontinence: Secondary | ICD-10-CM

## 2018-11-30 DIAGNOSIS — E7800 Pure hypercholesterolemia, unspecified: Secondary | ICD-10-CM | POA: Diagnosis not present

## 2018-11-30 DIAGNOSIS — M810 Age-related osteoporosis without current pathological fracture: Secondary | ICD-10-CM | POA: Diagnosis not present

## 2018-11-30 DIAGNOSIS — M159 Polyosteoarthritis, unspecified: Secondary | ICD-10-CM

## 2018-11-30 NOTE — Assessment & Plan Note (Signed)
Continue Detrol LA Will monitor

## 2018-11-30 NOTE — Assessment & Plan Note (Signed)
No issues off statin Will monitor

## 2018-11-30 NOTE — Patient Instructions (Signed)

## 2018-11-30 NOTE — Assessment & Plan Note (Signed)
Continue Vit D and weight bearing exercise

## 2018-11-30 NOTE — Assessment & Plan Note (Signed)
Encouraged regular physical activity Continue Celebrex Has Tylenol Arthritis prn

## 2018-11-30 NOTE — Progress Notes (Signed)
Subjective:    Patient ID: [name] [name], female    DOB: [birth year], 85 y.o.   MRN: [id number]  HPI  Resident seen in [address] for routine follow up Reviewed with RN, no new concerns Resident reports intermittent headaches in her forehead. She describes the pain as pressure. She denies dizziness or visual changes. She is have a runny nose and feels like it could be related to allergies. She takes Claritin daily  Otherwise, she sleeps well. She is independent with ADL's. She walks with a walker. She denies recent falls. Her appetite is good. She denies weight loss. She denies urinary incontinence. Her bowels are moving normally. She c/o intermittent joint pain, but not severe pain. She denies pain, reflux or SOB.  OAB: Urge controlled with Detrol LA.  Osteoporosis: She gets daily weight bearing exercise.  She is taking Vit D OTC.  HLD: Currently not on a statin. She consumes a low fat diet.  OA: Generalized. Pain controlled with Celebrex.  Review of Systems      Past Medical History:  Diagnosis Date  . Allergic rhinitis due to pollen    some shellfish also  . Aortic sclerosis 5/12   On echo. no stenosis  . Arthritis   . Diastolic dysfunction    stress echo otherwise normal 1/04. Repeat 5/11 also negative  . History of seasonal allergies   . Hyperlipidemia   . Osteoarthritis, multiple sites   . Osteoporosis   . Overactive bladder   . Urge incontinence   . Vaginal odor 04/2018   aerobic vaginitis on One Swab culture    Current Outpatient Medications  Medication Sig Dispense Refill  . aspirin 81 MG tablet Take 81 mg by mouth daily.    . celecoxib (CELEBREX) 200 MG capsule TAKE 1 CAPSULE DAILY 90 capsule 3  . CVS VITAMIN D3 1000 UNITS capsule TAKE ONE CAPSULE BY MOUTH DAILY 30 capsule 0  . DETROL LA 4 MG 24 hr capsule TAKE 1 CAPSULE DAILY 90 capsule 3  . dorzolamide-timolol (COSOPT) 22.3-6.8 MG/ML ophthalmic solution Apply to eye.    . latanoprost (XALATAN) 0.005 %  ophthalmic solution     . Multiple Vitamin (MULTIVITAMIN) tablet Take 1 tablet by mouth daily.    . Probiotic Product (PROBIOTIC ADVANCED PO) Take 1 capsule by mouth daily.    . Simethicone (GAS-X PO) Take by mouth 2 (two) times daily.    Marland Kitchen acetaminophen (TYLENOL) 650 MG CR tablet Take 650 mg by mouth 3 (three) times daily.    . Melatonin 10 MG TABS Take by mouth.    . polyethylene glycol (MIRALAX / GLYCOLAX) packet Take 17 g by mouth daily as needed.    . psyllium (METAMUCIL SMOOTH TEXTURE) 28 % packet Take 1 packet by mouth daily.    Marland Kitchen triamcinolone cream (KENALOG) 0.1 % Apply 1 application topically 2 (two) times daily. Apply to AA. No more than 2 weeks at a time (Patient not taking: Reported on 09/30/2018) 80 g 0   No current facility-administered medications for this visit.     Allergies  Allergen Reactions  . Demerol [Meperidine]     dizzy  . Shellfish Allergy   . Avelox [Moxifloxacin Hcl In Nacl] Rash    Family History  Problem Relation Age of Onset  . Cancer Father 35       colon cancer  . Heart disease Father     Social History   Socioeconomic History  . Marital status: Widowed  Spouse name: Not on file  . Number of children: 3  . Years of education: Not on file  . Highest education level: Not on file  Occupational History  . Occupation: Pharmacist, hospital    Comment: short time  . Occupation: Herbalist    Comment: most of career  Social Needs  . Financial resource strain: Not on file  . Food insecurity:    Worry: Not on file    Inability: Not on file  . Transportation needs:    Medical: Not on file    Non-medical: Not on file  Tobacco Use  . Smoking status: Former Research scientist (life sciences)  . Smokeless tobacco: Never Used  Substance and Sexual Activity  . Alcohol use: Yes    Comment: wine before dinner  . Drug use: No  . Sexual activity: Not Currently    Birth control/protection: Post-menopausal  Lifestyle  . Physical activity:    Days per week: Not on file    Minutes  per session: Not on file  . Stress: Not on file  Relationships  . Social connections:    Talks on phone: Not on file    Gets together: Not on file    Attends religious service: Not on file    Active member of club or organization: Not on file    Attends meetings of clubs or organizations: Not on file    Relationship status: Not on file  . Intimate partner violence:    Fear of current or ex partner: Not on file    Emotionally abused: Not on file    Physically abused: Not on file    Forced sexual activity: Not on file  Other Topics Concern  . Not on file  Social History Narrative   Widowed 2019   Has living will   Daughter [name], is health care POA   Would accept trial of resuscitation   Would probably accept feeding tube     Constitutional: Denies fever, malaise, fatigue, headache or abrupt weight changes.  HEENT: Denies eye pain, eye redness, ear pain, ringing in the ears, wax buildup, runny nose, nasal congestion, bloody nose, or sore throat. Respiratory: Denies difficulty breathing, shortness of breath, cough or sputum production.   Cardiovascular: Denies chest pain, chest tightness, palpitations or swelling in the hands or feet.  Gastrointestinal: Denies abdominal pain, bloating, constipation, diarrhea or blood in the stool.  GU: Pt reports urinary urgency. Denies frequency, pain with urination, burning sensation, blood in urine, odor or discharge. Musculoskeletal: Pt reports intermittent joint pain. Denies decrease in range of motion, difficulty with gait, muscle pain or joint swelling.  Skin: Denies redness, rashes, lesions or ulcercations.  Neurological: Denies dizziness, difficulty with memory, difficulty with speech or problems with balance and coordination.  Psych: Denies anxiety, depression, SI/HI.  No other specific complaints in a complete review of systems (except as listed in HPI above).  Objective:   Physical Exam   BP 133/71   Pulse 88   Temp 97.6 F  (36.4 C)   Resp 20  Wt Readings from Last 3 Encounters:  09/30/18 146 lb (66.2 kg)  08/18/18 142 lb (64.4 kg)  06/06/18 136 lb (61.7 kg)    General: Appears her stated age, well developed, well nourished in NAD. Skin: Warm, dry and intact.  Cardiovascular: Normal rate and rhythm. S1,S2 noted.  No murmur, rubs or gallops noted. No JVD or BLE edema. Pulmonary/Chest: Normal effort and positive vesicular breath sounds. No respiratory distress. No wheezes, rales or ronchi noted.  Abdomen: Soft and nontender. Normal bowel sounds. No distention or masses noted.   Neurological: Alert and oriented.   Psychiatric: Mood and affect normal. Behavior is normal. Judgment and thought content normal.     BMET    Component Value Date/Time   NA 133 (L) 03/07/2018 1604   K 4.7 03/07/2018 1604   CL 99 03/07/2018 1604   CO2 29 03/07/2018 1604   GLUCOSE 102 (H) 03/07/2018 1604   BUN 18 03/07/2018 1604   CREATININE 0.77 03/07/2018 1604   CALCIUM 9.5 03/07/2018 1604   GFRNONAA >60 04/28/2017 0412   GFRAA >60 04/28/2017 0412    Lipid Panel     Component Value Date/Time   CHOL 232 (H) 02/06/2013 1108   TRIG 72.0 02/06/2013 1108   HDL 77.50 02/06/2013 1108   CHOLHDL 3 02/06/2013 1108   VLDL 14.4 02/06/2013 1108    CBC    Component Value Date/Time   WBC 8.4 03/07/2018 1604   RBC 4.07 03/07/2018 1604   HGB 13.0 03/07/2018 1604   HCT 38.6 03/07/2018 1604   PLT 288.0 03/07/2018 1604   MCV 94.9 03/07/2018 1604   MCH 31.4 04/28/2017 0412   MCHC 33.6 03/07/2018 1604   RDW 14.0 03/07/2018 1604   LYMPHSABS 4.2 (H) 04/28/2017 0412   MONOABS 1.1 (H) 04/28/2017 0412   EOSABS 0.0 04/28/2017 0412   BASOSABS 0.0 04/28/2017 0412    Hgb A1C No results found for: HGBA1C         Assessment & Plan:

## 2018-12-29 DIAGNOSIS — I10 Essential (primary) hypertension: Secondary | ICD-10-CM | POA: Diagnosis not present

## 2019-01-06 DIAGNOSIS — J3081 Allergic rhinitis due to animal (cat) (dog) hair and dander: Secondary | ICD-10-CM | POA: Diagnosis not present

## 2019-01-06 DIAGNOSIS — J3089 Other allergic rhinitis: Secondary | ICD-10-CM | POA: Diagnosis not present

## 2019-01-06 DIAGNOSIS — J301 Allergic rhinitis due to pollen: Secondary | ICD-10-CM | POA: Diagnosis not present

## 2019-01-06 DIAGNOSIS — H1045 Other chronic allergic conjunctivitis: Secondary | ICD-10-CM | POA: Diagnosis not present

## 2019-01-09 DIAGNOSIS — J3081 Allergic rhinitis due to animal (cat) (dog) hair and dander: Secondary | ICD-10-CM | POA: Diagnosis not present

## 2019-01-09 DIAGNOSIS — J301 Allergic rhinitis due to pollen: Secondary | ICD-10-CM | POA: Diagnosis not present

## 2019-01-09 DIAGNOSIS — J3089 Other allergic rhinitis: Secondary | ICD-10-CM | POA: Diagnosis not present

## 2019-02-17 DIAGNOSIS — H401132 Primary open-angle glaucoma, bilateral, moderate stage: Secondary | ICD-10-CM | POA: Diagnosis not present

## 2019-02-23 ENCOUNTER — Ambulatory Visit: Payer: Medicare Other | Admitting: Internal Medicine

## 2019-02-23 ENCOUNTER — Encounter: Payer: Self-pay | Admitting: Internal Medicine

## 2019-02-23 ENCOUNTER — Other Ambulatory Visit: Payer: Self-pay

## 2019-02-23 DIAGNOSIS — I5032 Chronic diastolic (congestive) heart failure: Secondary | ICD-10-CM | POA: Diagnosis not present

## 2019-02-23 DIAGNOSIS — J301 Allergic rhinitis due to pollen: Secondary | ICD-10-CM

## 2019-02-23 DIAGNOSIS — N3941 Urge incontinence: Secondary | ICD-10-CM | POA: Diagnosis not present

## 2019-02-23 DIAGNOSIS — R198 Other specified symptoms and signs involving the digestive system and abdomen: Secondary | ICD-10-CM | POA: Diagnosis not present

## 2019-02-23 DIAGNOSIS — M159 Polyosteoarthritis, unspecified: Secondary | ICD-10-CM

## 2019-02-23 DIAGNOSIS — M150 Primary generalized (osteo)arthritis: Secondary | ICD-10-CM | POA: Diagnosis not present

## 2019-02-23 NOTE — Assessment & Plan Note (Signed)
Does okay with prn tylenol

## 2019-02-23 NOTE — Assessment & Plan Note (Signed)
Doing okay with the current regimen--probiotic, metamucil

## 2019-02-23 NOTE — Progress Notes (Signed)
Subjective:    Patient ID: [name] [name], female    DOB: 12/18/1923, 85 y.o.   MRN: [id number]  HPI Visit in [place] apartment for review of chronic health conditions Reviewed status with Luellen Pucker RN  Has been going to allergist for weekly immunotherapy Goes back 10-15 years Now getting the injections here Also on flonase and xyzal This is helping  Some trouble with urinary urgency--then incontinence Despite the detrol--continues (but it does help) Wears liners for protection  Chronic bowel problems Uses the metamucil and probiotic  Tries to walk daily Uses rollator Has AM aide to check on her---occasionally needs help dressing due to chronic shoulder problems Showers herself  Still gets some hip pain as well Tylenol prn helps this as well  No chest pain Gets DOE with walking [place]--when it is hot Sleeps flat without PND Has had some intermittent ankle edema--this is new. They occasionally hurt  Current Outpatient Medications on File Prior to Visit  Medication Sig Dispense Refill  . acetaminophen (TYLENOL) 500 MG tablet Take 1,000 mg by mouth 3 (three) times daily as needed.    . celecoxib (CELEBREX) 200 MG capsule TAKE 1 CAPSULE DAILY 90 capsule 3  . CVS VITAMIN D3 1000 UNITS capsule TAKE ONE CAPSULE BY MOUTH DAILY 30 capsule 0  . DETROL LA 4 MG 24 hr capsule TAKE 1 CAPSULE DAILY 90 capsule 3  . dorzolamide-timolol (COSOPT) 22.3-6.8 MG/ML ophthalmic solution Apply to eye.    . fluticasone (FLONASE) 50 MCG/ACT nasal spray Place 1 spray into both nostrils 2 (two) times a day.    . latanoprost (XALATAN) 0.005 % ophthalmic solution     . levocetirizine (XYZAL) 5 MG tablet Take 5 mg by mouth every evening.    . Melatonin 10 MG TABS Take by mouth.    . Multiple Vitamin (MULTIVITAMIN) tablet Take 1 tablet by mouth daily.    . Probiotic Product (PROBIOTIC ADVANCED PO) Take 1 capsule by mouth daily.    . psyllium (METAMUCIL SMOOTH TEXTURE) 28 % packet Take 1 packet by  mouth daily.    Marland Kitchen acetaminophen (TYLENOL) 650 MG CR tablet Take 650 mg by mouth 3 (three) times daily.    . Simethicone (GAS-X PO) Take by mouth 2 (two) times daily.    Marland Kitchen triamcinolone cream (KENALOG) 0.1 % Apply 1 application topically 2 (two) times daily. Apply to AA. No more than 2 weeks at a time (Patient not taking: Reported on 09/30/2018) 80 g 0   No current facility-administered medications on file prior to visit.     Allergies  Allergen Reactions  . Demerol [Meperidine]     dizzy  . Shellfish Allergy   . Avelox [Moxifloxacin Hcl In Nacl] Rash    Past Medical History:  Diagnosis Date  . Allergic rhinitis due to pollen    some shellfish also  . Aortic sclerosis 5/12   On echo. no stenosis  . Arthritis   . Diastolic dysfunction    stress echo otherwise normal 1/04. Repeat 5/11 also negative  . History of seasonal allergies   . Hx of basal cell carcinoma 1990   multiple sites  . Hx of squamous cell carcinoma of skin 2000   R nasolabial fold  . Hyperlipidemia   . Osteoarthritis, multiple sites   . Osteoporosis   . Overactive bladder   . Urge incontinence   . Vaginal odor 04/2018   aerobic vaginitis on One Swab culture    Past Surgical History:  Procedure  Laterality Date  . CYSTOCELE REPAIR  2007   and rectocele  . DILATION AND CURETTAGE OF UTERUS    . INGUINAL HERNIA REPAIR  2012   left side with mesh  . REVERSE SHOULDER ARTHROPLASTY Right 04/27/2017   Procedure: REVERSE SHOULDER ARTHROPLASTY;  Surgeon: Corky Mull, MD;  Location: [facility] ORS;  Service: Orthopedics;  Laterality: Right;  . TONSILLECTOMY AND ADENOIDECTOMY     as child  . TOTAL HIP ARTHROPLASTY Right 05/23/2015   Procedure: TOTAL HIP ARTHROPLASTY ANTERIOR APPROACH;  Surgeon: Hessie Knows, MD;  Location: [facility] ORS;  Service: Orthopedics;  Laterality: Right;  . UMBILICAL HERNIA REPAIR  02/04/06    Family History  Problem Relation Age of Onset  . Cancer Father 24       colon cancer  . Heart disease  Father     Social History   Socioeconomic History  . Marital status: Widowed    Spouse name: Not on file  . Number of children: 3  . Years of education: Not on file  . Highest education level: Not on file  Occupational History  . Occupation: Pharmacist, hospital    Comment: short time  . Occupation: Herbalist    Comment: most of career  Social Needs  . Financial resource strain: Not on file  . Food insecurity    Worry: Not on file    Inability: Not on file  . Transportation needs    Medical: Not on file    Non-medical: Not on file  Tobacco Use  . Smoking status: Former Research scientist (life sciences)  . Smokeless tobacco: Never Used  Substance and Sexual Activity  . Alcohol use: Yes    Comment: wine before dinner  . Drug use: No  . Sexual activity: Not Currently    Birth control/protection: Post-menopausal  Lifestyle  . Physical activity    Days per week: Not on file    Minutes per session: Not on file  . Stress: Not on file  Relationships  . Social Herbalist on phone: Not on file    Gets together: Not on file    Attends religious service: Not on file    Active member of club or organization: Not on file    Attends meetings of clubs or organizations: Not on file    Relationship status: Not on file  . Intimate partner violence    Fear of current or ex partner: Not on file    Emotionally abused: Not on file    Physically abused: Not on file    Forced sexual activity: Not on file  Other Topics Concern  . Not on file  Social History Narrative   Widowed 2019   Has living will   Daughter [name], is health care POA   Would accept trial of resuscitation   Would probably accept feeding tube   Review of Systems Gets intermittent headaches---tylenol  Appetite is great Has gained weight since coming here Sleep is variable--"on and off". Will just get up and read for a while if having trouble Hasn't seen the dermatologist lately---lots of itchy keratosis treated in the past     Objective:   Physical Exam  Constitutional: She appears well-developed. No distress.  Neck: No thyromegaly present.  Cardiovascular: Normal rate, regular rhythm and normal heart sounds. Exam reveals no gallop.  No murmur heard. Respiratory: Effort normal and breath sounds normal. No respiratory distress. She has no wheezes. She has no rales.  GI: Soft. There is no abdominal tenderness.  Musculoskeletal:        General: No tenderness or edema.  Lymphadenopathy:    She has no cervical adenopathy.  Skin: No rash noted. No erythema.  Psychiatric: She has a normal mood and affect. Her behavior is normal.           Assessment & Plan:

## 2019-02-23 NOTE — Assessment & Plan Note (Signed)
Weekly immunotherapy and meds are helping

## 2019-02-23 NOTE — Assessment & Plan Note (Signed)
Known diastolic dysfunction for many years---now with intermittent DOE and edema Weight up but no clear fluid overload (likely from eating more) May be related mostly to the heat No action for now--but discussed symptoms of decompensation

## 2019-02-23 NOTE — Assessment & Plan Note (Signed)
Persists despite the detrol--but it does help without apparent side effects

## 2019-03-07 ENCOUNTER — Telehealth: Payer: Self-pay | Admitting: Internal Medicine

## 2019-03-07 NOTE — Telephone Encounter (Signed)
I left a detailed message on Marcia's voice mail letting her know Dr.Letvak's comments and that I cancelled the appointment.

## 2019-03-07 NOTE — Telephone Encounter (Signed)
No Please cancel the appointment---it was made before she moved to assisted living Let the daughter know

## 2019-03-07 NOTE — Telephone Encounter (Signed)
Patient's Daughter [name] called today in regards to a notification they received for the patient's appointment on Thursday,  She stated that you came and seen the patient at [facility] on 7/23. Due to patient's age [name] stated it is hard for them to get the patient into the office and would like to know if you still need her to be seen on 8/6 ?      Marcia's C/b# [phone]

## 2019-03-09 ENCOUNTER — Encounter: Payer: Medicare Other | Admitting: Internal Medicine

## 2019-03-16 DIAGNOSIS — L578 Other skin changes due to chronic exposure to nonionizing radiation: Secondary | ICD-10-CM | POA: Diagnosis not present

## 2019-03-16 DIAGNOSIS — Z1283 Encounter for screening for malignant neoplasm of skin: Secondary | ICD-10-CM | POA: Diagnosis not present

## 2019-03-16 DIAGNOSIS — Z85828 Personal history of other malignant neoplasm of skin: Secondary | ICD-10-CM | POA: Diagnosis not present

## 2019-03-16 DIAGNOSIS — L820 Inflamed seborrheic keratosis: Secondary | ICD-10-CM | POA: Diagnosis not present

## 2019-03-16 DIAGNOSIS — D1800 Hemangioma unspecified site: Secondary | ICD-10-CM | POA: Diagnosis not present

## 2019-03-16 DIAGNOSIS — L821 Other seborrheic keratosis: Secondary | ICD-10-CM | POA: Diagnosis not present

## 2019-03-16 DIAGNOSIS — L814 Other melanin hyperpigmentation: Secondary | ICD-10-CM | POA: Diagnosis not present

## 2019-03-16 DIAGNOSIS — L853 Xerosis cutis: Secondary | ICD-10-CM | POA: Diagnosis not present

## 2019-05-03 ENCOUNTER — Other Ambulatory Visit: Payer: Self-pay

## 2019-05-03 ENCOUNTER — Ambulatory Visit: Payer: Medicare Other | Admitting: Internal Medicine

## 2019-05-03 VITALS — BP 133/79 | HR 72 | Resp 16

## 2019-05-03 DIAGNOSIS — R194 Change in bowel habit: Secondary | ICD-10-CM | POA: Diagnosis not present

## 2019-05-03 DIAGNOSIS — K5900 Constipation, unspecified: Secondary | ICD-10-CM | POA: Diagnosis not present

## 2019-05-03 DIAGNOSIS — R1030 Lower abdominal pain, unspecified: Secondary | ICD-10-CM | POA: Diagnosis not present

## 2019-05-03 DIAGNOSIS — R109 Unspecified abdominal pain: Secondary | ICD-10-CM | POA: Diagnosis not present

## 2019-05-06 ENCOUNTER — Encounter: Payer: Self-pay | Admitting: Internal Medicine

## 2019-05-06 NOTE — Patient Instructions (Signed)

## 2019-05-06 NOTE — Progress Notes (Signed)
Subjective:    Patient ID: [name] [name], female    DOB: 03-07-1924, 85 y.o.   MRN: [id number]  HPI  Asked to see resident in ALF C/o abdominal pain and constipation x 3 weeks. She describes the abdominal pain as sore and achy. She denies nausea or vomiting. She denies blood in the stools but has had some dark stools. She denies fever, chills or body aches. She has been taking Metamucil and Senekot with minimal relief. Recently in quarantine x 2 weeks. She does have a history of diverticulosis, and family hx of colon cancer in father.  Review of Systems      Past Medical History:  Diagnosis Date  . Allergic rhinitis due to pollen    some shellfish also  . Aortic sclerosis 5/12   On echo. no stenosis  . Arthritis   . Diastolic dysfunction    stress echo otherwise normal 1/04. Repeat 5/11 also negative  . History of seasonal allergies   . Hx of basal cell carcinoma 1990   multiple sites  . Hx of squamous cell carcinoma of skin 2000   R nasolabial fold  . Hyperlipidemia   . Osteoarthritis, multiple sites   . Osteoporosis   . Overactive bladder   . Urge incontinence   . Vaginal odor 04/2018   aerobic vaginitis on One Swab culture    Current Outpatient Medications  Medication Sig Dispense Refill  . acetaminophen (TYLENOL) 500 MG tablet Take 1,000 mg by mouth 3 (three) times daily as needed.    Marland Kitchen acetaminophen (TYLENOL) 650 MG CR tablet Take 650 mg by mouth 3 (three) times daily.    . celecoxib (CELEBREX) 200 MG capsule TAKE 1 CAPSULE DAILY 90 capsule 3  . CVS VITAMIN D3 1000 UNITS capsule TAKE ONE CAPSULE BY MOUTH DAILY 30 capsule 0  . DETROL LA 4 MG 24 hr capsule TAKE 1 CAPSULE DAILY 90 capsule 3  . dorzolamide-timolol (COSOPT) 22.3-6.8 MG/ML ophthalmic solution Apply to eye.    . fluticasone (FLONASE) 50 MCG/ACT nasal spray Place 1 spray into both nostrils 2 (two) times a day.    . latanoprost (XALATAN) 0.005 % ophthalmic solution     . levocetirizine (XYZAL) 5 MG  tablet Take 5 mg by mouth every evening.    . Melatonin 10 MG TABS Take by mouth.    . Multiple Vitamin (MULTIVITAMIN) tablet Take 1 tablet by mouth daily.    . Probiotic Product (PROBIOTIC ADVANCED PO) Take 1 capsule by mouth daily.    . psyllium (METAMUCIL SMOOTH TEXTURE) 28 % packet Take 1 packet by mouth daily.    . Simethicone (GAS-X PO) Take by mouth 2 (two) times daily.    Marland Kitchen triamcinolone cream (KENALOG) 0.1 % Apply 1 application topically 2 (two) times daily. Apply to AA. No more than 2 weeks at a time (Patient not taking: Reported on 09/30/2018) 80 g 0   No current facility-administered medications for this visit.     Allergies  Allergen Reactions  . Demerol [Meperidine]     dizzy  . Shellfish Allergy   . Avelox [Moxifloxacin Hcl In Nacl] Rash    Family History  Problem Relation Age of Onset  . Cancer Father 56       colon cancer  . Heart disease Father     Social History   Socioeconomic History  . Marital status: Widowed    Spouse name: Not on file  . Number of children: 3  . Years of education:  Not on file  . Highest education level: Not on file  Occupational History  . Occupation: Pharmacist, hospital    Comment: short time  . Occupation: Herbalist    Comment: most of career  Social Needs  . Financial resource strain: Not on file  . Food insecurity    Worry: Not on file    Inability: Not on file  . Transportation needs    Medical: Not on file    Non-medical: Not on file  Tobacco Use  . Smoking status: Former Research scientist (life sciences)  . Smokeless tobacco: Never Used  Substance and Sexual Activity  . Alcohol use: Yes    Comment: wine before dinner  . Drug use: No  . Sexual activity: Not Currently    Birth control/protection: Post-menopausal  Lifestyle  . Physical activity    Days per week: Not on file    Minutes per session: Not on file  . Stress: Not on file  Relationships  . Social Herbalist on phone: Not on file    Gets together: Not on file    Attends  religious service: Not on file    Active member of club or organization: Not on file    Attends meetings of clubs or organizations: Not on file    Relationship status: Not on file  . Intimate partner violence    Fear of current or ex partner: Not on file    Emotionally abused: Not on file    Physically abused: Not on file    Forced sexual activity: Not on file  Other Topics Concern  . Not on file  Social History Narrative   Widowed 2019   Has living will   Daughter [name], is health care POA   Would accept trial of resuscitation   Would probably accept feeding tube     Constitutional: Denies fever, malaise, fatigue, headache or abrupt weight changes.  Gastrointestinal: Pt reports abdominal pain, constipation. Denies bloating, diarrhea or blood in the stool.  GU: Denies urgency, frequency, pain with urination, burning sensation, blood in urine, odor or discharge.  No other specific complaints in a complete review of systems (except as listed in HPI above).  Objective:   Physical Exam  BP 133/79   Pulse 72   Resp 16   SpO2 98%  Wt Readings from Last 3 Encounters:  02/23/19 152 lb 9.6 oz (69.2 kg)  09/30/18 146 lb (66.2 kg)  08/18/18 142 lb (64.4 kg)    General: Appears her stated age, well developed, well nourished in NAD. Skin: Warm, dry and intact. No rashes noted.  Abdomen: Soft and mildly tender in bilateral lower abdomen. Hypoactive bowel sounds. No distention or masses noted.  Neurological: Alert and oriented.   BMET    Component Value Date/Time   NA 133 (L) 03/07/2018 1604   K 4.7 03/07/2018 1604   CL 99 03/07/2018 1604   CO2 29 03/07/2018 1604   GLUCOSE 102 (H) 03/07/2018 1604   BUN 18 03/07/2018 1604   CREATININE 0.77 03/07/2018 1604   CALCIUM 9.5 03/07/2018 1604   GFRNONAA >60 04/28/2017 0412   GFRAA >60 04/28/2017 0412    Lipid Panel     Component Value Date/Time   CHOL 232 (H) 02/06/2013 1108   TRIG 72.0 02/06/2013 1108   HDL 77.50 02/06/2013  1108   CHOLHDL 3 02/06/2013 1108   VLDL 14.4 02/06/2013 1108    CBC    Component Value Date/Time   WBC 8.4 03/07/2018 1604  RBC 4.07 03/07/2018 1604   HGB 13.0 03/07/2018 1604   HCT 38.6 03/07/2018 1604   PLT 288.0 03/07/2018 1604   MCV 94.9 03/07/2018 1604   MCH 31.4 04/28/2017 0412   MCHC 33.6 03/07/2018 1604   RDW 14.0 03/07/2018 1604   LYMPHSABS 4.2 (H) 04/28/2017 0412   MONOABS 1.1 (H) 04/28/2017 0412   EOSABS 0.0 04/28/2017 0412   BASOSABS 0.0 04/28/2017 0412    Hgb A1C No results found for: HGBA1C          Assessment & Plan:   Abdominal Pain, Constipation:  KUB ordered to assess stool burden Stop Metamucil Start Mirilax 17 gm daily Continue Senekot Encouraged adequate water intake  Will reassess as needed Webb Silversmith, NP

## 2019-05-22 ENCOUNTER — Other Ambulatory Visit: Payer: Self-pay | Admitting: Internal Medicine

## 2019-05-30 DIAGNOSIS — J3089 Other allergic rhinitis: Secondary | ICD-10-CM | POA: Diagnosis not present

## 2019-05-30 DIAGNOSIS — J3081 Allergic rhinitis due to animal (cat) (dog) hair and dander: Secondary | ICD-10-CM | POA: Diagnosis not present

## 2019-05-30 DIAGNOSIS — J301 Allergic rhinitis due to pollen: Secondary | ICD-10-CM | POA: Diagnosis not present

## 2019-06-15 ENCOUNTER — Encounter: Payer: Self-pay | Admitting: Internal Medicine

## 2019-06-16 ENCOUNTER — Other Ambulatory Visit: Payer: Self-pay | Admitting: Internal Medicine

## 2019-06-21 ENCOUNTER — Other Ambulatory Visit: Payer: Self-pay

## 2019-06-21 ENCOUNTER — Encounter: Payer: Self-pay | Admitting: Internal Medicine

## 2019-06-21 ENCOUNTER — Ambulatory Visit: Payer: Medicare Other | Admitting: Internal Medicine

## 2019-06-21 DIAGNOSIS — E7800 Pure hypercholesterolemia, unspecified: Secondary | ICD-10-CM | POA: Diagnosis not present

## 2019-06-21 DIAGNOSIS — R198 Other specified symptoms and signs involving the digestive system and abdomen: Secondary | ICD-10-CM

## 2019-06-21 DIAGNOSIS — I5032 Chronic diastolic (congestive) heart failure: Secondary | ICD-10-CM | POA: Diagnosis not present

## 2019-06-21 DIAGNOSIS — M8949 Other hypertrophic osteoarthropathy, multiple sites: Secondary | ICD-10-CM

## 2019-06-21 DIAGNOSIS — M159 Polyosteoarthritis, unspecified: Secondary | ICD-10-CM

## 2019-06-21 DIAGNOSIS — N3941 Urge incontinence: Secondary | ICD-10-CM

## 2019-06-21 DIAGNOSIS — M810 Age-related osteoporosis without current pathological fracture: Secondary | ICD-10-CM | POA: Diagnosis not present

## 2019-06-21 MED ORDER — LIDOCAINE 5 % EX PTCH: 1.0000 | MEDICATED_PATCH | CUTANEOUS | 0 refills | Status: DC

## 2019-06-21 MED ORDER — OMEPRAZOLE 20 MG PO CPDR: 20.0000 mg | DELAYED_RELEASE_CAPSULE | Freq: Every day | ORAL | 3 refills | Status: DC

## 2019-06-21 NOTE — Assessment & Plan Note (Signed)
Denies CP, SOB or new concerns.  Not taking medications. Will continue to monitor.

## 2019-06-21 NOTE — Assessment & Plan Note (Signed)
No recent LDL on file.  Encouraged to eat a healthy and low-fat diet.

## 2019-06-21 NOTE — Patient Instructions (Signed)

## 2019-06-21 NOTE — Progress Notes (Signed)
Subjective:    Patient ID: [name] [name], female    DOB: 09/22/1923, 85 y.o.   MRN: [id number]  HPI   Resident seen in appt [phone] for routine follow up RN reports patient has had increased left shoulder pain. Recently, added Lidoderm patches. She states the patches have been working well and help with the pain. Overall, patient states she has been ok. She complains of intermittent pain across lower abdomen. She states they are sharp in nature and when the pain comes it lasts about an hour. Walking helps with pain. She reports alternating constipation and diarrhea which she thinks is causing her abdominal pain. Weight today is 155 lbs (up 3 lbs). Sleeps well. She ambulates with the use of a walker. Appetite is good. Denies CP, SOB or reflux.   Abdominal pain: Intermittent, across lower abdomen. Currently not taking any medications for this.  OA:  Managed on Celebrex 200 mg and Tylenol 500mg . Also, taking Lidoderm patches for left shoulder pain.   Insomnia: Sleeping well and is taking Melatonin 10mg  PRN.  Urge Incontinance: No new issues. Taking Tolterodine a prescribed.  CHF, Diastolic: Compensated off meds.   Review of Systems      Past Medical History:  Diagnosis Date  . Allergic rhinitis due to pollen    some shellfish also  . Aortic sclerosis 5/12   On echo. no stenosis  . Arthritis   . Diastolic dysfunction    stress echo otherwise normal 1/04. Repeat 5/11 also negative  . History of seasonal allergies   . Hx of basal cell carcinoma 1990   multiple sites  . Hx of squamous cell carcinoma of skin 2000   R nasolabial fold  . Hyperlipidemia   . Osteoarthritis, multiple sites   . Osteoporosis   . Overactive bladder   . Urge incontinence   . Vaginal odor 04/2018   aerobic vaginitis on One Swab culture    Current Outpatient Medications  Medication Sig Dispense Refill  . acetaminophen (TYLENOL) 500 MG tablet Take 1,000 mg by mouth 3 (three) times daily as needed.     Marland Kitchen acetaminophen (TYLENOL) 650 MG CR tablet Take 650 mg by mouth 3 (three) times daily.    . celecoxib (CELEBREX) 200 MG capsule TAKE 1 CAPSULE DAILY 90 capsule 0  . CVS VITAMIN D3 1000 UNITS capsule TAKE ONE CAPSULE BY MOUTH DAILY 30 capsule 0  . DETROL LA 4 MG 24 hr capsule TAKE 1 CAPSULE DAILY 90 capsule 3  . dorzolamide-timolol (COSOPT) 22.3-6.8 MG/ML ophthalmic solution Apply to eye.    . fluticasone (FLONASE) 50 MCG/ACT nasal spray Place 1 spray into both nostrils 2 (two) times a day.    . latanoprost (XALATAN) 0.005 % ophthalmic solution     . levocetirizine (XYZAL) 5 MG tablet Take 5 mg by mouth every evening.    . Melatonin 10 MG TABS Take by mouth.    . Multiple Vitamin (MULTIVITAMIN) tablet Take 1 tablet by mouth daily.    . Probiotic Product (PROBIOTIC ADVANCED PO) Take 1 capsule by mouth daily.    . psyllium (METAMUCIL SMOOTH TEXTURE) 28 % packet Take 1 packet by mouth daily.    . Simethicone (GAS-X PO) Take by mouth 2 (two) times daily.     No current facility-administered medications for this visit.     Allergies  Allergen Reactions  . Demerol [Meperidine]     dizzy  . Shellfish Allergy   . Avelox [Moxifloxacin Hcl In Nacl] Rash  Family History  Problem Relation Age of Onset  . Cancer Father 58       colon cancer  . Heart disease Father     Social History   Socioeconomic History  . Marital status: Widowed    Spouse name: Not on file  . Number of children: 3  . Years of education: Not on file  . Highest education level: Not on file  Occupational History  . Occupation: Pharmacist, hospital    Comment: short time  . Occupation: Herbalist    Comment: most of career  Social Needs  . Financial resource strain: Not on file  . Food insecurity    Worry: Not on file    Inability: Not on file  . Transportation needs    Medical: Not on file    Non-medical: Not on file  Tobacco Use  . Smoking status: Former Research scientist (life sciences)  . Smokeless tobacco: Never Used  Substance and  Sexual Activity  . Alcohol use: Yes    Comment: wine before dinner  . Drug use: No  . Sexual activity: Not Currently    Birth control/protection: Post-menopausal  Lifestyle  . Physical activity    Days per week: Not on file    Minutes per session: Not on file  . Stress: Not on file  Relationships  . Social Herbalist on phone: Not on file    Gets together: Not on file    Attends religious service: Not on file    Active member of club or organization: Not on file    Attends meetings of clubs or organizations: Not on file    Relationship status: Not on file  . Intimate partner violence    Fear of current or ex partner: Not on file    Emotionally abused: Not on file    Physically abused: Not on file    Forced sexual activity: Not on file  Other Topics Concern  . Not on file  Social History Narrative   Widowed 2019   Has living will   Daughter [name], is health care POA   Would accept trial of resuscitation   Would probably accept feeding tube     Constitutional: Denies fever, malaise, fatigue, headache or abrupt weight changes.  HEENT: Denies eye pain, eye redness, ear pain, ringing in the ears, wax buildup, runny nose, nasal congestion, bloody nose, or sore throat. Respiratory: Denies difficulty breathing, shortness of breath, cough or sputum production.   Cardiovascular: Denies chest pain, chest tightness, palpitations or swelling in the hands or feet.  Gastrointestinal: Pt reports intermittent lower abdominal pain and bowel change: constipation that alternates with diarrhea. Denies blood in stool. GU: Pt reports urge incontinence. Denies urgency, frequency, pain with urination, burning sensation, blood in urine, odor or discharge. Musculoskeletal: Right shoulder pain. Denies decrease in range of motion, difficulty with gait. .  Skin: Denies redness, rashes, lesions or ulcercations.  Neurological: Denies dizziness, difficulty with memory, difficulty with speech or  problems with balance and coordination.  Psych: Denies anxiety, depression, SI/HI.  No other specific complaints in a complete review of systems (except as listed in HPI above).  Objective:   Physical Exam BP (!) 142/73   Pulse 80   Temp (!) 97.1 F (36.2 C)   Resp 17   Wt 155 lb 12.8 oz (70.7 kg)   SpO2 94%   BMI 25.93 kg/m   Wt Readings from Last 3 Encounters:  02/23/19 152 lb 9.6 oz (69.2 kg)  09/30/18  146 lb (66.2 kg)  08/18/18 142 lb (64.4 kg)    General: Appears her stated age, engages fully in NAD. Skin: Warm, dry and intact. No ulcerations noted. Neck:  Neck supple, trachea midline. No masses, lumps or thyromegaly present.  Cardiovascular: Normal rate and rhythm. S1,S2 noted.  No murmur, rubs or gallops noted. No JVD or BLE edema. Pulmonary/Chest: Normal effort and positive vesicular breath sounds. No respiratory distress. No wheezes, rales or ronchi noted.  Abdomen: Soft and nontender. Normal bowel sounds. No distention or masses noted.  Musculoskeletal: 5+ strength bilateral upper/lower extremities. No difficulty with gait.  Neurological: Alert and oriented. Cranial nerves II-XII grossly intact. Coordination normal. and sensation intact.  Psychiatric: Mood and affect normal. Behavior is normal. Judgment and thought content normal.     BMET    Component Value Date/Time   NA 133 (L) 03/07/2018 1604   K 4.7 03/07/2018 1604   CL 99 03/07/2018 1604   CO2 29 03/07/2018 1604   GLUCOSE 102 (H) 03/07/2018 1604   BUN 18 03/07/2018 1604   CREATININE 0.77 03/07/2018 1604   CALCIUM 9.5 03/07/2018 1604   GFRNONAA >60 04/28/2017 0412   GFRAA >60 04/28/2017 0412    Lipid Panel     Component Value Date/Time   CHOL 232 (H) 02/06/2013 1108   TRIG 72.0 02/06/2013 1108   HDL 77.50 02/06/2013 1108   CHOLHDL 3 02/06/2013 1108   VLDL 14.4 02/06/2013 1108    CBC    Component Value Date/Time   WBC 8.4 03/07/2018 1604   RBC 4.07 03/07/2018 1604   HGB 13.0 03/07/2018  1604   HCT 38.6 03/07/2018 1604   PLT 288.0 03/07/2018 1604   MCV 94.9 03/07/2018 1604   MCH 31.4 04/28/2017 0412   MCHC 33.6 03/07/2018 1604   RDW 14.0 03/07/2018 1604   LYMPHSABS 4.2 (H) 04/28/2017 0412   MONOABS 1.1 (H) 04/28/2017 0412   EOSABS 0.0 04/28/2017 0412   BASOSABS 0.0 04/28/2017 0412    Hgb A1C No results found for: HGBA1C           Assessment & Plan:   Epigastric Pain, Alternating Constipation and Diarrhea:  Could be side effect from Celebrex Start Omeprazole 20 mg daily Continue Align for IBD  Will reassess as needed Webb Silversmith, NP

## 2019-06-21 NOTE — Assessment & Plan Note (Addendum)
Continue taking Celebrex 200 mg and Tylenol 500mg .  Left arm pain significantly improved with lidoderm patches. Will continue to monitor.

## 2019-06-21 NOTE — Assessment & Plan Note (Signed)
Reports no new issues.  Continue taking Tolterodine 4mg  as prescribed.

## 2019-06-21 NOTE — Assessment & Plan Note (Addendum)
Continue Calcium and Vit D Encouraged weight bearing exercise

## 2019-06-21 NOTE — Assessment & Plan Note (Signed)
Continue [employer]

## 2019-06-28 ENCOUNTER — Other Ambulatory Visit: Payer: Self-pay

## 2019-08-14 DIAGNOSIS — Z23 Encounter for immunization: Secondary | ICD-10-CM | POA: Diagnosis not present

## 2019-09-11 ENCOUNTER — Other Ambulatory Visit: Payer: Self-pay | Admitting: Internal Medicine

## 2019-09-11 DIAGNOSIS — Z23 Encounter for immunization: Secondary | ICD-10-CM | POA: Diagnosis not present

## 2019-09-14 ENCOUNTER — Ambulatory Visit: Payer: Medicare Other | Admitting: Obstetrics and Gynecology

## 2019-09-21 ENCOUNTER — Ambulatory Visit: Payer: Medicare Other | Admitting: Obstetrics and Gynecology

## 2019-10-04 ENCOUNTER — Other Ambulatory Visit: Payer: Self-pay

## 2019-10-04 ENCOUNTER — Ambulatory Visit: Payer: Medicare Other | Admitting: Internal Medicine

## 2019-10-04 DIAGNOSIS — N939 Abnormal uterine and vaginal bleeding, unspecified: Secondary | ICD-10-CM | POA: Diagnosis not present

## 2019-10-04 DIAGNOSIS — K625 Hemorrhage of anus and rectum: Secondary | ICD-10-CM | POA: Diagnosis not present

## 2019-10-04 DIAGNOSIS — N3941 Urge incontinence: Secondary | ICD-10-CM | POA: Diagnosis not present

## 2019-10-06 ENCOUNTER — Encounter: Payer: Self-pay | Admitting: Internal Medicine

## 2019-10-06 MED ORDER — MIRABEGRON ER 25 MG PO TB24: 25.0000 mg | ORAL_TABLET | Freq: Every day | ORAL | 0 refills | Status: DC

## 2019-10-06 NOTE — Patient Instructions (Signed)

## 2019-10-06 NOTE — Progress Notes (Signed)
Subjective:    Patient ID: [name] [name], female    DOB: [birth year], 86 y.o.   MRN: [id number]  HPI  Asked to see resident in [address] Resident has concerns that her Detrol LA is not working. She continues to have urinary urge incontinence. She has also had intermittent vaginal bleeding- has an appt with [facility] for evaluation of this. She also notes rectal bleeding. This is a small amount. She denies abdominal pain or weight loss. She reports a family history of colon cancer.  Review of Systems      Past Medical History:  Diagnosis Date  . Allergic rhinitis due to pollen    some shellfish also  . Aortic sclerosis 5/12   On echo. no stenosis  . Arthritis   . Diastolic dysfunction    stress echo otherwise normal 1/04. Repeat 5/11 also negative  . History of seasonal allergies   . Hx of basal cell carcinoma 1990   multiple sites  . Hx of squamous cell carcinoma of skin 2000   R nasolabial fold  . Hyperlipidemia   . Osteoarthritis, multiple sites   . Osteoporosis   . Overactive bladder   . Urge incontinence   . Vaginal odor 04/2018   aerobic vaginitis on One Swab culture    Current Outpatient Medications  Medication Sig Dispense Refill  . acetaminophen (TYLENOL) 500 MG tablet Take 1,000 mg by mouth 3 (three) times daily as needed.    Marland Kitchen acetaminophen (TYLENOL) 650 MG CR tablet Take 650 mg by mouth 3 (three) times daily.    . celecoxib (CELEBREX) 200 MG capsule TAKE 1 CAPSULE DAILY 90 capsule 3  . CVS VITAMIN D3 1000 UNITS capsule TAKE ONE CAPSULE BY MOUTH DAILY 30 capsule 0  . DETROL LA 4 MG 24 hr capsule TAKE 1 CAPSULE DAILY 90 capsule 3  . dorzolamide-timolol (COSOPT) 22.3-6.8 MG/ML ophthalmic solution Apply to eye.    . fluticasone (FLONASE) 50 MCG/ACT nasal spray Place 1 spray into both nostrils 2 (two) times a day.    . latanoprost (XALATAN) 0.005 % ophthalmic solution     . levocetirizine (XYZAL) 5 MG tablet Take 5 mg by mouth every evening.    .  lidocaine (LIDODERM) 5 % Place 1 patch onto the skin daily. Remove & Discard patch within 12 hours or as directed by MD 30 patch 0  . Melatonin 10 MG TABS Take by mouth.    . Multiple Vitamin (MULTIVITAMIN) tablet Take 1 tablet by mouth daily.    Marland Kitchen omeprazole (PRILOSEC) 20 MG capsule Take 1 capsule (20 mg total) by mouth daily. 30 capsule 3  . Probiotic Product (PROBIOTIC ADVANCED PO) Take 1 capsule by mouth daily.    . psyllium (METAMUCIL SMOOTH TEXTURE) 28 % packet Take 1 packet by mouth daily.    . Simethicone (GAS-X PO) Take by mouth 2 (two) times daily.     No current facility-administered medications for this visit.    Allergies  Allergen Reactions  . Demerol [Meperidine]     dizzy  . Shellfish Allergy   . Avelox [Moxifloxacin Hcl In Nacl] Rash    Family History  Problem Relation Age of Onset  . Cancer Father 61       colon cancer  . Heart disease Father     Social History   Socioeconomic History  . Marital status: Widowed    Spouse name: Not on file  . Number of children: 3  . Years of education: Not  on file  . Highest education level: Not on file  Occupational History  . Occupation: Pharmacist, hospital    Comment: short time  . Occupation: Herbalist    Comment: most of career  Tobacco Use  . Smoking status: Former Research scientist (life sciences)  . Smokeless tobacco: Never Used  Substance and Sexual Activity  . Alcohol use: Yes    Comment: wine before dinner  . Drug use: No  . Sexual activity: Not Currently    Birth control/protection: Post-menopausal  Other Topics Concern  . Not on file  Social History Narrative   Widowed 2019   Has living will   Daughter [name], is health care POA   Would accept trial of resuscitation   Would probably accept feeding tube   Social Determinants of Health   Financial Resource Strain:   . Difficulty of Paying Living Expenses: Not on file  Food Insecurity:   . Worried About Charity fundraiser in the Last Year: Not on file  . Ran Out of Food in  the Last Year: Not on file  Transportation Needs:   . Lack of Transportation (Medical): Not on file  . Lack of Transportation (Non-Medical): Not on file  Physical Activity:   . Days of Exercise per Week: Not on file  . Minutes of Exercise per Session: Not on file  Stress:   . Feeling of Stress : Not on file  Social Connections:   . Frequency of Communication with Friends and Family: Not on file  . Frequency of Social Gatherings with Friends and Family: Not on file  . Attends Religious Services: Not on file  . Active Member of Clubs or Organizations: Not on file  . Attends Archivist Meetings: Not on file  . Marital Status: Not on file  Intimate Partner Violence:   . Fear of Current or Ex-Partner: Not on file  . Emotionally Abused: Not on file  . Physically Abused: Not on file  . Sexually Abused: Not on file     Constitutional: Denies fever, malaise, fatigue, headache or abrupt weight changes.  HEENT: Denies eye pain, eye redness, ear pain, ringing in the ears, wax buildup, runny nose, nasal congestion, bloody nose, or sore throat. Respiratory: Denies difficulty breathing, shortness of breath, cough or sputum production.   Cardiovascular: Denies chest pain, chest tightness, palpitations or swelling in the hands or feet.  Gastrointestinal: Pt reports blood in stool. Denies abdominal pain, bloating, constipation, diarrhea.  GU: Pt reports urge incontinence, vaginal bleeding. Denies frequency, pain with urination, burning sensation, blood in urine, odor or discharge.  No other specific complaints in a complete review of systems (except as listed in HPI above).  Objective:   Physical Exam   There were no vitals taken for this visit. Wt Readings from Last 3 Encounters:  06/21/19 155 lb 12.8 oz (70.7 kg)  02/23/19 152 lb 9.6 oz (69.2 kg)  09/30/18 146 lb (66.2 kg)    General: Appears her stated age, well developed, well nourished in NAD. Cardiovascular: Normal rate and  rhythm. Pulmonary/Chest: Normal effort and positive vesicular breath sounds. No respiratory distress. No wheezes, rales or ronchi noted.  Abdomen: Soft and nontender. Normal bowel sounds. No distention or masses noted.  Pelvic: No external masses noted. No current vaginal bleeding or discharge. Rectal: She has a small partial rectal prolapse, that does not appear to be actively bleeding. No bleeding hemorrhoids or anal fissure noted.  Neurological: Alert and oriented.  BMET    Component Value Date/Time  NA 133 (L) 03/07/2018 1604   K 4.7 03/07/2018 1604   CL 99 03/07/2018 1604   CO2 29 03/07/2018 1604   GLUCOSE 102 (H) 03/07/2018 1604   BUN 18 03/07/2018 1604   CREATININE 0.77 03/07/2018 1604   CALCIUM 9.5 03/07/2018 1604   GFRNONAA >60 04/28/2017 0412   GFRAA >60 04/28/2017 0412    Lipid Panel     Component Value Date/Time   CHOL 232 (H) 02/06/2013 1108   TRIG 72.0 02/06/2013 1108   HDL 77.50 02/06/2013 1108   CHOLHDL 3 02/06/2013 1108   VLDL 14.4 02/06/2013 1108    CBC    Component Value Date/Time   WBC 8.4 03/07/2018 1604   RBC 4.07 03/07/2018 1604   HGB 13.0 03/07/2018 1604   HCT 38.6 03/07/2018 1604   PLT 288.0 03/07/2018 1604   MCV 94.9 03/07/2018 1604   MCH 31.4 04/28/2017 0412   MCHC 33.6 03/07/2018 1604   RDW 14.0 03/07/2018 1604   LYMPHSABS 4.2 (H) 04/28/2017 0412   MONOABS 1.1 (H) 04/28/2017 0412   EOSABS 0.0 04/28/2017 0412   BASOSABS 0.0 04/28/2017 0412    Hgb A1C No results found for: HGBA1C         Assessment & Plan:   Urge Incontinence:  D/C Detrol LA RX for Myrbetriq 25 mg PO daily  Vaginal Bleeding:  Has appt with GYN for further evaluation  Rectal Bleeding:  ? Coming from rectal prolapse RX for Hydrocortisone cream 2.5% BID prn Will d/w Dr. Silvio Pate for further recommendations  Will reassess as needed Webb Silversmith, NP

## 2019-10-09 ENCOUNTER — Encounter: Payer: Self-pay | Admitting: Obstetrics and Gynecology

## 2019-10-09 ENCOUNTER — Other Ambulatory Visit: Payer: Self-pay

## 2019-10-09 ENCOUNTER — Ambulatory Visit (INDEPENDENT_AMBULATORY_CARE_PROVIDER_SITE_OTHER): Payer: Medicare Other | Admitting: Obstetrics and Gynecology

## 2019-10-09 VITALS — Ht 65.0 in | Wt 161.0 lb

## 2019-10-09 DIAGNOSIS — N950 Postmenopausal bleeding: Secondary | ICD-10-CM

## 2019-10-09 DIAGNOSIS — N3281 Overactive bladder: Secondary | ICD-10-CM

## 2019-10-09 NOTE — Progress Notes (Signed)
Patient ID: [name] [name], female   DOB: 05/27/1924, 86 y.o.   MRN: [id number]  Reason for Consult: Vaginal Bleeding (Got worse around a month ago but has gone back to what it was before )   Referred by Venia Carbon, MD  Subjective:     HPI:  [name] [name] is a 86 y.o. female she presents today for follow-up of vaginal bleeding.  She reports that her vaginal bleeding got a little bit worse last month but now has returned to normal.  She brought in several pads with her for me to inspect.  Each pad had a small stripe of blood dark rusty in color.  She has complaints of overactive bladder.  She reports that she gets up at least twice at night to go to the bathroom.  She does have a history of glaucoma.  Has not been on medication and has not tried timed voids for the overactive bladder symptoms.  She has never completed bladder diary.  She has not recently had a urine culture test.  She is still living in a skilled nursing facility.  She reports that despite wearing a panty liner as well as depends when she has bladder accidents she will soak the depends and her clothing which is very bothersome.  Last year she was seen by me for complaints of discharge as well as small amounts of daily vaginal bleeding.  Ultrasound at that time had shown fluid inside the endometrial canal we had discussed the possibility of a pyometrium as well as possibility of malignancy.  Patient did not desire treatment for potential malignancy and plan was to follow-up for yearly monitoring.  The patient still has the same desires.  She does note occasional abdominal pain in her abdomen.    Past Medical History:  Diagnosis Date  . Allergic rhinitis due to pollen    some shellfish also  . Aortic sclerosis 5/12   On echo. no stenosis  . Arthritis   . Diastolic dysfunction    stress echo otherwise normal 1/04. Repeat 5/11 also negative  . History of seasonal allergies   . Hx of basal cell carcinoma 1990     multiple sites  . Hx of squamous cell carcinoma of skin 2000   R nasolabial fold  . Hyperlipidemia   . Osteoarthritis, multiple sites   . Osteoporosis   . Overactive bladder   . Urge incontinence   . Vaginal odor 04/2018   aerobic vaginitis on One Swab culture   Family History  Problem Relation Age of Onset  . Cancer Father 38       colon cancer  . Heart disease Father    Past Surgical History:  Procedure Laterality Date  . CYSTOCELE REPAIR  2007   and rectocele  . DILATION AND CURETTAGE OF UTERUS    . INGUINAL HERNIA REPAIR  2012   left side with mesh  . REVERSE SHOULDER ARTHROPLASTY Right 04/27/2017   Procedure: REVERSE SHOULDER ARTHROPLASTY;  Surgeon: Corky Mull, MD;  Location: [facility];  Service: Orthopedics;  Laterality: Right;  . TONSILLECTOMY AND ADENOIDECTOMY     as child  . TOTAL HIP ARTHROPLASTY Right 05/23/2015   Procedure: TOTAL HIP ARTHROPLASTY ANTERIOR APPROACH;  Surgeon: Hessie Knows, MD;  Location: [facility] OR[facility];  Service: Orthopedics;  Laterality: Right;  . UMBILICAL HERNIA REPAIR  02/04/06    Short Social History:  Social History   Tobacco Use  . Smoking status: Former Research scientist (life sciences)  . Smokeless tobacco:  Never Used  Substance Use Topics  . Alcohol use: Yes    Comment: wine before dinner    Allergies  Allergen Reactions  . Demerol [Meperidine]     dizzy  . Shellfish Allergy   . Avelox [Moxifloxacin Hcl In Nacl] Rash    Current Outpatient Medications  Medication Sig Dispense Refill  . acetaminophen (TYLENOL) 650 MG CR tablet Take 650 mg by mouth 3 (three) times daily.    . celecoxib (CELEBREX) 200 MG capsule TAKE 1 CAPSULE DAILY 90 capsule 3  . CVS VITAMIN D3 1000 UNITS capsule TAKE ONE CAPSULE BY MOUTH DAILY 30 capsule 0  . dorzolamide-timolol (COSOPT) 22.3-6.8 MG/ML ophthalmic solution Apply to eye.    Marland Kitchen EPIPEN 2-PAK 0.3 MG/0.3ML SOAJ injection Inject into the muscle once.    . fluticasone (FLONASE) 50 MCG/ACT nasal spray Place 1 spray into both  nostrils 2 (two) times a day.    . latanoprost (XALATAN) 0.005 % ophthalmic solution     . levocetirizine (XYZAL) 5 MG tablet Take 5 mg by mouth every evening.    . lidocaine (LIDODERM) 5 % Place 1 patch onto the skin daily. Remove & Discard patch within 12 hours or as directed by MD 30 patch 0  . Melatonin 10 MG TABS Take by mouth.    . Multiple Vitamin (MULTIVITAMIN) tablet Take 1 tablet by mouth daily.    Marland Kitchen omeprazole (PRILOSEC) 20 MG capsule Take 1 capsule (20 mg total) by mouth daily. 30 capsule 3  . polyethylene glycol powder (GLYCOLAX/MIRALAX) 17 GM/SCOOP powder Take by mouth.    . predniSONE (DELTASONE) 20 MG tablet Take 20 mg by mouth daily.    . Probiotic Product (PROBIOTIC ADVANCED PO) Take 1 capsule by mouth daily.    . Simethicone (GAS-X PO) Take by mouth 2 (two) times daily.     No current facility-administered medications for this visit.    Review of Systems  Constitutional: Negative for chills, fatigue, fever and unexpected weight change.  HENT: Negative for trouble swallowing.  Eyes: Negative for loss of vision.  Respiratory: Negative for cough, shortness of breath and wheezing.  Cardiovascular: Negative for chest pain, leg swelling, palpitations and syncope.  GI: Negative for abdominal pain, blood in stool, diarrhea, nausea and vomiting.  GU: Negative for difficulty urinating, dysuria, frequency and hematuria.  Musculoskeletal: Negative for back pain, leg pain and joint pain.  Skin: Negative for rash.  Neurological: Negative for dizziness, headaches, light-headedness, numbness and seizures.  Psychiatric: Negative for behavioral problem, confusion, depressed mood and sleep disturbance.        Objective:  Objective   Vitals:   10/09/19 1101  Weight: 161 lb (73 kg)  Height: 5\' 5"  (1.651 m)   Body mass index is 26.79 kg/m.  Physical Exam Vitals and nursing note reviewed.  Constitutional:      Appearance: She is well-developed.  HENT:     Head: Normocephalic  and atraumatic.  Eyes:     Pupils: Pupils are equal, round, and reactive to light.  Cardiovascular:     Rate and Rhythm: Normal rate and regular rhythm.  Pulmonary:     Effort: Pulmonary effort is normal. No respiratory distress.  Skin:    General: Skin is warm and dry.  Neurological:     Mental Status: She is alert and oriented to person, place, and time.  Psychiatric:        Behavior: Behavior normal.        Thought Content: Thought content normal.  Judgment: Judgment normal.        Assessment/Plan:     86 year old with symptoms of overactive bladder.  Provided with a bladder diary as well as a hat.  Will reports symptoms for 3 days and return to office.  Could not obtain urine culture specimen today because patient had previously voided.  We will plan to obtain urine culture with next visit.  Pelvic ultrasound not appointments not available today in office.  Patient will follow up for pelvic ultrasound for monitoring of her endometrium and vaginal bleeding.  Patient and her daughter who is present today in the office expressed that they would continue to not like intervention for malignancy because of her advanced age.  More than 25 minutes were spent face to face with the patient in the room with more than 50% of the time spent providing counseling and discussing the plan of management.     Adrian Prows MD [facility] 10/09/2019 11:35 AM

## 2019-10-16 ENCOUNTER — Telehealth: Payer: Self-pay

## 2019-10-16 NOTE — Telephone Encounter (Signed)
[name] [name] is calling on behalf of pt, asking for a bladder diary sheet. If we can fax a copy to [phone].

## 2019-10-16 NOTE — Telephone Encounter (Signed)
Bladder diary faxed. Luellen Pucker is aware.

## 2019-10-17 DIAGNOSIS — H401133 Primary open-angle glaucoma, bilateral, severe stage: Secondary | ICD-10-CM | POA: Diagnosis not present

## 2019-10-24 ENCOUNTER — Ambulatory Visit (INDEPENDENT_AMBULATORY_CARE_PROVIDER_SITE_OTHER): Payer: Medicare Other

## 2019-10-24 ENCOUNTER — Other Ambulatory Visit: Payer: Self-pay

## 2019-10-24 ENCOUNTER — Other Ambulatory Visit: Payer: Self-pay | Admitting: Obstetrics and Gynecology

## 2019-10-24 ENCOUNTER — Ambulatory Visit (INDEPENDENT_AMBULATORY_CARE_PROVIDER_SITE_OTHER): Payer: Medicare Other | Admitting: Obstetrics and Gynecology

## 2019-10-24 ENCOUNTER — Encounter: Payer: Self-pay | Admitting: Obstetrics and Gynecology

## 2019-10-24 VITALS — BP 120/64 | Ht 65.0 in | Wt 160.0 lb

## 2019-10-24 DIAGNOSIS — N950 Postmenopausal bleeding: Secondary | ICD-10-CM

## 2019-10-24 DIAGNOSIS — N898 Other specified noninflammatory disorders of vagina: Secondary | ICD-10-CM

## 2019-10-24 DIAGNOSIS — N3281 Overactive bladder: Secondary | ICD-10-CM | POA: Diagnosis not present

## 2019-10-24 NOTE — Progress Notes (Signed)
Patient ID: [name] [name], female   DOB: [birth year], 86 y.o.   MRN: [id number]  Reason for Consult: Follow-up (U/S follow up )   Referred by Venia Carbon, MD  Subjective:     HPI:  [name] [name] is a 86 y.o. female she presents today for follow-up ultrasound.  She reports that her symptoms of discharging vaginal bleeding have largely improved. She had brought with her a bladder diary.    Past Medical History:  Diagnosis Date  . Allergic rhinitis due to pollen    some shellfish also  . Aortic sclerosis 5/12   On echo. no stenosis  . Arthritis   . Diastolic dysfunction    stress echo otherwise normal 1/04. Repeat 5/11 also negative  . History of seasonal allergies   . Hx of basal cell carcinoma 1990   multiple sites  . Hx of squamous cell carcinoma of skin 2000   R nasolabial fold  . Hyperlipidemia   . Osteoarthritis, multiple sites   . Osteoporosis   . Overactive bladder   . Urge incontinence   . Vaginal odor 04/2018   aerobic vaginitis on One Swab culture   Family History  Problem Relation Age of Onset  . Cancer Father 58       colon cancer  . Heart disease Father    Past Surgical History:  Procedure Laterality Date  . CYSTOCELE REPAIR  2007   and rectocele  . DILATION AND CURETTAGE OF UTERUS    . INGUINAL HERNIA REPAIR  2012   left side with mesh  . REVERSE SHOULDER ARTHROPLASTY Right 04/27/2017   Procedure: REVERSE SHOULDER ARTHROPLASTY;  Surgeon: Corky Mull, MD;  Location: [facility] OR[facility];  Service: Orthopedics;  Laterality: Right;  . TONSILLECTOMY AND ADENOIDECTOMY     as child  . TOTAL HIP ARTHROPLASTY Right 05/23/2015   Procedure: TOTAL HIP ARTHROPLASTY ANTERIOR APPROACH;  Surgeon: Hessie Knows, MD;  Location: [facility];  Service: Orthopedics;  Laterality: Right;  . UMBILICAL HERNIA REPAIR  02/04/06    Short Social History:  Social History   Tobacco Use  . Smoking status: Former Research scientist (life sciences)  . Smokeless tobacco: Never Used  Substance Use  Topics  . Alcohol use: Yes    Comment: wine before dinner    Allergies  Allergen Reactions  . Demerol [Meperidine]     dizzy  . Shellfish Allergy   . Avelox [Moxifloxacin Hcl In Nacl] Rash    Current Outpatient Medications  Medication Sig Dispense Refill  . acetaminophen (TYLENOL) 650 MG CR tablet Take 650 mg by mouth 3 (three) times daily.    . celecoxib (CELEBREX) 200 MG capsule TAKE 1 CAPSULE DAILY 90 capsule 3  . CVS VITAMIN D3 1000 UNITS capsule TAKE ONE CAPSULE BY MOUTH DAILY 30 capsule 0  . DETROL LA 4 MG 24 hr capsule     . dorzolamide-timolol (COSOPT) 22.3-6.8 MG/ML ophthalmic solution Apply to eye.    Marland Kitchen EPIPEN 2-PAK 0.3 MG/0.3ML SOAJ injection Inject into the muscle once.    . fluticasone (FLONASE) 50 MCG/ACT nasal spray Place 1 spray into both nostrils 2 (two) times a day.    . latanoprost (XALATAN) 0.005 % ophthalmic solution     . levocetirizine (XYZAL) 5 MG tablet Take 5 mg by mouth every evening.    . lidocaine (LIDODERM) 5 % Place 1 patch onto the skin daily. Remove & Discard patch within 12 hours or as directed by MD 30 patch 0  . Melatonin 10  MG TABS Take by mouth.    . Multiple Vitamin (MULTIVITAMIN) tablet Take 1 tablet by mouth daily.    Marland Kitchen MYRBETRIQ 25 MG TB24 tablet     . omeprazole (PRILOSEC) 20 MG capsule Take 1 capsule (20 mg total) by mouth daily. 30 capsule 3  . polyethylene glycol powder (GLYCOLAX/MIRALAX) 17 GM/SCOOP powder Take by mouth.    . Probiotic Product (PROBIOTIC ADVANCED PO) Take 1 capsule by mouth daily.    . Simethicone (GAS-X PO) Take by mouth 2 (two) times daily.     No current facility-administered medications for this visit.    Review of Systems  Constitutional: Negative for chills, fatigue, fever and unexpected weight change.  HENT: Negative for trouble swallowing.  Eyes: Negative for loss of vision.  Respiratory: Negative for cough, shortness of breath and wheezing.  Cardiovascular: Negative for chest pain, leg swelling,  palpitations and syncope.  GI: Negative for abdominal pain, blood in stool, diarrhea, nausea and vomiting.  GU: Negative for difficulty urinating, dysuria, frequency and hematuria.  Musculoskeletal: Negative for back pain, leg pain and joint pain.  Skin: Negative for rash.  Neurological: Negative for dizziness, headaches, light-headedness, numbness and seizures.  Psychiatric: Negative for behavioral problem, confusion, depressed mood and sleep disturbance.        Objective:  Objective   Vitals:   10/24/19 1133  BP: 120/64  Weight: 160 lb (72.6 kg)  Height: 5\' 5"  (1.651 m)   Body mass index is 26.63 kg/m.  Physical Exam Vitals and nursing note reviewed.  Constitutional:      Appearance: She is well-developed.  HENT:     Head: Normocephalic and atraumatic.  Eyes:     Pupils: Pupils are equal, round, and reactive to light.  Cardiovascular:     Rate and Rhythm: Normal rate and regular rhythm.  Pulmonary:     Effort: Pulmonary effort is normal. No respiratory distress.  Skin:    General: Skin is warm and dry.  Neurological:     Mental Status: She is alert and oriented to person, place, and time.  Psychiatric:        Behavior: Behavior normal.        Thought Content: Thought content normal.        Judgment: Judgment normal.         Assessment/Plan:     86 year old with abnormal vaginal discharge and small amounts of vaginal bleeding. Patient has been monitored for possible pyometrium.  We have had the conversation in the past that these pyometrium's are often associated with uterine cancer however given the patient's advanced age she is not interested in any treatment for cancer.  Our plan for follow-up has been for annual ultrasound.  Today's ultrasound largely unchanged from her previous ultrasound last year.  Low level of concern for malignancy however continued pyometrium present.  Today she reports that her symptoms have largely improved.  Patient did complain at  her last visit with urinary frequency and has brought her bladder diary for me to review. Will review and address with her daughter over the phone. She was unable to give a urinary specimen today.      More than 20 minutes were spent face to face with the patient in the room with more than 50% of the time spent providing counseling and discussing the plan of management.   Adrian Prows MD [facility] 10/24/2019 5:31 PM

## 2019-10-25 DIAGNOSIS — J301 Allergic rhinitis due to pollen: Secondary | ICD-10-CM | POA: Diagnosis not present

## 2019-10-25 DIAGNOSIS — J3089 Other allergic rhinitis: Secondary | ICD-10-CM | POA: Diagnosis not present

## 2019-10-25 DIAGNOSIS — J3081 Allergic rhinitis due to animal (cat) (dog) hair and dander: Secondary | ICD-10-CM | POA: Diagnosis not present

## 2019-10-26 ENCOUNTER — Other Ambulatory Visit: Payer: Self-pay

## 2019-10-26 ENCOUNTER — Encounter: Payer: Self-pay | Admitting: Internal Medicine

## 2019-10-26 ENCOUNTER — Ambulatory Visit: Payer: Medicare Other | Admitting: Internal Medicine

## 2019-10-26 DIAGNOSIS — N3941 Urge incontinence: Secondary | ICD-10-CM

## 2019-10-26 DIAGNOSIS — I5032 Chronic diastolic (congestive) heart failure: Secondary | ICD-10-CM

## 2019-10-26 DIAGNOSIS — M8949 Other hypertrophic osteoarthropathy, multiple sites: Secondary | ICD-10-CM

## 2019-10-26 DIAGNOSIS — R198 Other specified symptoms and signs involving the digestive system and abdomen: Secondary | ICD-10-CM | POA: Diagnosis not present

## 2019-10-26 DIAGNOSIS — J301 Allergic rhinitis due to pollen: Secondary | ICD-10-CM | POA: Diagnosis not present

## 2019-10-26 DIAGNOSIS — M159 Polyosteoarthritis, unspecified: Secondary | ICD-10-CM

## 2019-10-26 NOTE — Assessment & Plan Note (Signed)
Doing some better at night with lidoderm patch Tylenol and celebrex Discussed tramadol or norco---she prefers to hold off

## 2019-10-26 NOTE — Assessment & Plan Note (Signed)
No sig problems on her current regimen

## 2019-10-26 NOTE — Assessment & Plan Note (Signed)
Gets easy DO[place] Discussed interval training----to increase stamina No acute exacerbation

## 2019-10-26 NOTE — Assessment & Plan Note (Signed)
myrbetriq really seems to be helping and she is tolerating it well

## 2019-10-26 NOTE — Progress Notes (Signed)
Subjective:    Patient ID: [name] [name], female    DOB: 10-25-1923, 86 y.o.   MRN: [id number]  HPI Visit in assisted living apartment for follow up of chronic health conditions Reviewed status with Luellen Pucker RN  Recent vaginal bleeding ----saw gyn Had ultrasound 2 days ago---reviewed report, doesn't seem to be concerning The bleeding has stopped  Both shoulders are hurting The right one despite the replacement Had some left hand swelling---needed to have rings cut off On celebrex/tylenol/lidoderm 9using at night and it helps her sleep) Also some back   Now on mybetriq for urge incontinence She finds this helps a lot---generally makes it now, but still wears a pad  Bowels still irregular On miralax and probiotic---seems to be helping Needed proctofoam last week for some bleeding  No chest pain Gets easy DOE--walking around the pond. Rests and gets better She does push it though Some mild edema in ankles--they feel tight at times No palpitations  Current Outpatient Medications on File Prior to Visit  Medication Sig Dispense Refill  . acetaminophen (TYLENOL) 650 MG CR tablet Take 650 mg by mouth 3 (three) times daily.    . celecoxib (CELEBREX) 200 MG capsule TAKE 1 CAPSULE DAILY 90 capsule 3  . [employer] VITAMIN D3 1000 UNITS capsule TAKE ONE CAPSULE BY MOUTH DAILY 30 capsule 0  . dorzolamide-timolol (COSOPT) 22.3-6.8 MG/ML ophthalmic solution Apply to eye.    Marland Kitchen EPIPEN 2-PAK 0.3 MG/0.3ML SOAJ injection Inject into the muscle once.    . fluticasone (FLONASE) 50 MCG/ACT nasal spray Place 1 spray into both nostrils 2 (two) times a day.    . latanoprost (XALATAN) 0.005 % ophthalmic solution     . levocetirizine (XYZAL) 5 MG tablet Take 5 mg by mouth every evening.    . lidocaine (LIDODERM) 5 % Place 1 patch onto the skin daily. Remove & Discard patch within 12 hours or as directed by MD 30 patch 0  . Melatonin 10 MG TABS Take by mouth.    . Multiple Vitamin (MULTIVITAMIN) tablet  Take 1 tablet by mouth daily.    Marland Kitchen MYRBETRIQ 25 MG TB24 tablet     . omeprazole (PRILOSEC) 20 MG capsule Take 1 capsule (20 mg total) by mouth daily. 30 capsule 3  . polyethylene glycol powder (GLYCOLAX/MIRALAX) 17 GM/SCOOP powder Take by mouth.    . Probiotic Product (PROBIOTIC ADVANCED PO) Take 1 capsule by mouth daily.    . Simethicone (GAS-X PO) Take by mouth 2 (two) times daily.     No current facility-administered medications on file prior to visit.    Allergies  Allergen Reactions  . Demerol [Meperidine]     dizzy  . Shellfish Allergy   . Avelox [Moxifloxacin Hcl In Nacl] Rash    Past Medical History:  Diagnosis Date  . Allergic rhinitis due to pollen    some shellfish also  . Aortic sclerosis 5/12   On echo. no stenosis  . Arthritis   . Diastolic dysfunction    stress echo otherwise normal 1/04. Repeat 5/11 also negative  . History of seasonal allergies   . Hx of basal cell carcinoma 1990   multiple sites  . Hx of squamous cell carcinoma of skin 2000   R nasolabial fold  . Hyperlipidemia   . Osteoarthritis, multiple sites   . Osteoporosis   . Overactive bladder   . Urge incontinence   . Vaginal odor 04/2018   aerobic vaginitis on One Swab culture    Past  Surgical History:  Procedure Laterality Date  . CYSTOCELE REPAIR  2007   and rectocele  . DILATION AND CURETTAGE OF UTERUS    . INGUINAL HERNIA REPAIR  2012   left side with mesh  . REVERSE SHOULDER ARTHROPLASTY Right 04/27/2017   Procedure: REVERSE SHOULDER ARTHROPLASTY;  Surgeon: Corky Mull, MD;  Location: [facility];  Service: Orthopedics;  Laterality: Right;  . TONSILLECTOMY AND ADENOIDECTOMY     as child  . TOTAL HIP ARTHROPLASTY Right 05/23/2015   Procedure: TOTAL HIP ARTHROPLASTY ANTERIOR APPROACH;  Surgeon: Hessie Knows, MD;  Location: [facility];  Service: Orthopedics;  Laterality: Right;  . UMBILICAL HERNIA REPAIR  02/04/06    Family History  Problem Relation Age of Onset  . Cancer Father 59         colon cancer  . Heart disease Father     Social History   Socioeconomic History  . Marital status: Widowed    Spouse name: Not on file  . Number of children: 3  . Years of education: Not on file  . Highest education level: Not on file  Occupational History  . Occupation: Pharmacist, hospital    Comment: short time  . Occupation: Herbalist    Comment: most of career  Tobacco Use  . Smoking status: Former Research scientist (life sciences)  . Smokeless tobacco: Never Used  Substance and Sexual Activity  . Alcohol use: Yes    Comment: wine before dinner  . Drug use: No  . Sexual activity: Not Currently    Birth control/protection: Post-menopausal  Other Topics Concern  . Not on file  Social History Narrative   Widowed 2019   Has living will   Daughter [name], is health care POA   Would accept trial of resuscitation   Would probably accept feeding tube   Social Determinants of Health   Financial Resource Strain:   . Difficulty of Paying Living Expenses:   Food Insecurity:   . Worried About Charity fundraiser in the Last Year:   . Arboriculturist in the Last Year:   Transportation Needs:   . Film/video editor (Medical):   Marland Kitchen Lack of Transportation (Non-Medical):   Physical Activity:   . Days of Exercise per Week:   . Minutes of Exercise per Session:   Stress:   . Feeling of Stress :   Social Connections:   . Frequency of Communication with Friends and Family:   . Frequency of Social Gatherings with Friends and Family:   . Attends Religious Services:   . Active Member of Clubs or Organizations:   . Attends Archivist Meetings:   Marland Kitchen Marital Status:   Intimate Partner Violence:   . Fear of Current or Ex-Partner:   . Emotionally Abused:   Marland Kitchen Physically Abused:   . Sexually Abused:    Review of Systems  Appetite is good Weight is now. Had gained when she moved here (around 20#) Now sleeping better since using patch on  Gets a lot of gas---still intermittent LLQ  discomfort---never persistent      Objective:   Physical Exam  Constitutional: She is oriented to person, place, and time. She appears well-developed. No distress.  Neck: No thyromegaly present.  Cardiovascular: Normal rate and regular rhythm. Exam reveals no gallop.  Faint systolic murmur along left sternal border  Respiratory: Effort normal and breath sounds normal. No respiratory distress. She has no wheezes. She has no rales.  GI: Soft. There is no abdominal tenderness.  Musculoskeletal:     Comments: No sig edema  Lymphadenopathy:    She has no cervical adenopathy.  Neurological: She is alert and oriented to person, place, and time.  Psychiatric: She has a normal mood and affect. Her behavior is normal.           Assessment & Plan:

## 2019-10-26 NOTE — Assessment & Plan Note (Signed)
Seems to be better now with miralax and probiotic Still gassy and gets pain at times No reason for change in meds now

## 2019-11-02 DIAGNOSIS — Z96611 Presence of right artificial shoulder joint: Secondary | ICD-10-CM | POA: Diagnosis not present

## 2019-11-02 DIAGNOSIS — M19012 Primary osteoarthritis, left shoulder: Secondary | ICD-10-CM | POA: Diagnosis not present

## 2019-11-02 DIAGNOSIS — M25512 Pain in left shoulder: Secondary | ICD-10-CM | POA: Diagnosis not present

## 2019-11-02 DIAGNOSIS — M6281 Muscle weakness (generalized): Secondary | ICD-10-CM | POA: Diagnosis not present

## 2019-11-02 DIAGNOSIS — M19011 Primary osteoarthritis, right shoulder: Secondary | ICD-10-CM | POA: Diagnosis not present

## 2019-11-02 DIAGNOSIS — M25511 Pain in right shoulder: Secondary | ICD-10-CM | POA: Diagnosis not present

## 2019-11-08 DIAGNOSIS — M6281 Muscle weakness (generalized): Secondary | ICD-10-CM | POA: Diagnosis not present

## 2019-11-08 DIAGNOSIS — M25511 Pain in right shoulder: Secondary | ICD-10-CM | POA: Diagnosis not present

## 2019-11-08 DIAGNOSIS — Z96611 Presence of right artificial shoulder joint: Secondary | ICD-10-CM | POA: Diagnosis not present

## 2019-11-08 DIAGNOSIS — M19012 Primary osteoarthritis, left shoulder: Secondary | ICD-10-CM | POA: Diagnosis not present

## 2019-11-08 DIAGNOSIS — M19011 Primary osteoarthritis, right shoulder: Secondary | ICD-10-CM | POA: Diagnosis not present

## 2019-11-08 DIAGNOSIS — M25512 Pain in left shoulder: Secondary | ICD-10-CM | POA: Diagnosis not present

## 2019-11-09 DIAGNOSIS — M19012 Primary osteoarthritis, left shoulder: Secondary | ICD-10-CM | POA: Diagnosis not present

## 2019-11-09 DIAGNOSIS — M6281 Muscle weakness (generalized): Secondary | ICD-10-CM | POA: Diagnosis not present

## 2019-11-09 DIAGNOSIS — M25512 Pain in left shoulder: Secondary | ICD-10-CM | POA: Diagnosis not present

## 2019-11-09 DIAGNOSIS — M25511 Pain in right shoulder: Secondary | ICD-10-CM | POA: Diagnosis not present

## 2019-11-09 DIAGNOSIS — Z96611 Presence of right artificial shoulder joint: Secondary | ICD-10-CM | POA: Diagnosis not present

## 2019-11-09 DIAGNOSIS — M19011 Primary osteoarthritis, right shoulder: Secondary | ICD-10-CM | POA: Diagnosis not present

## 2019-11-13 DIAGNOSIS — Z96611 Presence of right artificial shoulder joint: Secondary | ICD-10-CM | POA: Diagnosis not present

## 2019-11-13 DIAGNOSIS — M6281 Muscle weakness (generalized): Secondary | ICD-10-CM | POA: Diagnosis not present

## 2019-11-13 DIAGNOSIS — M19011 Primary osteoarthritis, right shoulder: Secondary | ICD-10-CM | POA: Diagnosis not present

## 2019-11-13 DIAGNOSIS — M25511 Pain in right shoulder: Secondary | ICD-10-CM | POA: Diagnosis not present

## 2019-11-13 DIAGNOSIS — M25512 Pain in left shoulder: Secondary | ICD-10-CM | POA: Diagnosis not present

## 2019-11-13 DIAGNOSIS — M19012 Primary osteoarthritis, left shoulder: Secondary | ICD-10-CM | POA: Diagnosis not present

## 2019-11-15 DIAGNOSIS — M19012 Primary osteoarthritis, left shoulder: Secondary | ICD-10-CM | POA: Diagnosis not present

## 2019-11-15 DIAGNOSIS — Z96611 Presence of right artificial shoulder joint: Secondary | ICD-10-CM | POA: Diagnosis not present

## 2019-11-15 DIAGNOSIS — M6281 Muscle weakness (generalized): Secondary | ICD-10-CM | POA: Diagnosis not present

## 2019-11-15 DIAGNOSIS — M25511 Pain in right shoulder: Secondary | ICD-10-CM | POA: Diagnosis not present

## 2019-11-15 DIAGNOSIS — M25512 Pain in left shoulder: Secondary | ICD-10-CM | POA: Diagnosis not present

## 2019-11-15 DIAGNOSIS — M19011 Primary osteoarthritis, right shoulder: Secondary | ICD-10-CM | POA: Diagnosis not present

## 2019-11-17 DIAGNOSIS — Z96611 Presence of right artificial shoulder joint: Secondary | ICD-10-CM | POA: Diagnosis not present

## 2019-11-17 DIAGNOSIS — M6281 Muscle weakness (generalized): Secondary | ICD-10-CM | POA: Diagnosis not present

## 2019-11-17 DIAGNOSIS — M25512 Pain in left shoulder: Secondary | ICD-10-CM | POA: Diagnosis not present

## 2019-11-17 DIAGNOSIS — M25511 Pain in right shoulder: Secondary | ICD-10-CM | POA: Diagnosis not present

## 2019-11-17 DIAGNOSIS — M19011 Primary osteoarthritis, right shoulder: Secondary | ICD-10-CM | POA: Diagnosis not present

## 2019-11-17 DIAGNOSIS — M19012 Primary osteoarthritis, left shoulder: Secondary | ICD-10-CM | POA: Diagnosis not present

## 2019-11-20 DIAGNOSIS — M25512 Pain in left shoulder: Secondary | ICD-10-CM | POA: Diagnosis not present

## 2019-11-20 DIAGNOSIS — M19011 Primary osteoarthritis, right shoulder: Secondary | ICD-10-CM | POA: Diagnosis not present

## 2019-11-20 DIAGNOSIS — M6281 Muscle weakness (generalized): Secondary | ICD-10-CM | POA: Diagnosis not present

## 2019-11-20 DIAGNOSIS — M19012 Primary osteoarthritis, left shoulder: Secondary | ICD-10-CM | POA: Diagnosis not present

## 2019-11-20 DIAGNOSIS — Z96611 Presence of right artificial shoulder joint: Secondary | ICD-10-CM | POA: Diagnosis not present

## 2019-11-20 DIAGNOSIS — M25511 Pain in right shoulder: Secondary | ICD-10-CM | POA: Diagnosis not present

## 2019-11-21 DIAGNOSIS — M19012 Primary osteoarthritis, left shoulder: Secondary | ICD-10-CM | POA: Diagnosis not present

## 2019-11-21 DIAGNOSIS — M25511 Pain in right shoulder: Secondary | ICD-10-CM | POA: Diagnosis not present

## 2019-11-21 DIAGNOSIS — Z96611 Presence of right artificial shoulder joint: Secondary | ICD-10-CM | POA: Diagnosis not present

## 2019-11-21 DIAGNOSIS — M25512 Pain in left shoulder: Secondary | ICD-10-CM | POA: Diagnosis not present

## 2019-11-21 DIAGNOSIS — M19011 Primary osteoarthritis, right shoulder: Secondary | ICD-10-CM | POA: Diagnosis not present

## 2019-11-21 DIAGNOSIS — M6281 Muscle weakness (generalized): Secondary | ICD-10-CM | POA: Diagnosis not present

## 2019-11-23 DIAGNOSIS — M6281 Muscle weakness (generalized): Secondary | ICD-10-CM | POA: Diagnosis not present

## 2019-11-23 DIAGNOSIS — M19011 Primary osteoarthritis, right shoulder: Secondary | ICD-10-CM | POA: Diagnosis not present

## 2019-11-23 DIAGNOSIS — Z96611 Presence of right artificial shoulder joint: Secondary | ICD-10-CM | POA: Diagnosis not present

## 2019-11-23 DIAGNOSIS — M19012 Primary osteoarthritis, left shoulder: Secondary | ICD-10-CM | POA: Diagnosis not present

## 2019-11-23 DIAGNOSIS — M25511 Pain in right shoulder: Secondary | ICD-10-CM | POA: Diagnosis not present

## 2019-11-23 DIAGNOSIS — M25512 Pain in left shoulder: Secondary | ICD-10-CM | POA: Diagnosis not present

## 2019-11-28 DIAGNOSIS — M6281 Muscle weakness (generalized): Secondary | ICD-10-CM | POA: Diagnosis not present

## 2019-11-28 DIAGNOSIS — M25511 Pain in right shoulder: Secondary | ICD-10-CM | POA: Diagnosis not present

## 2019-11-28 DIAGNOSIS — Z96611 Presence of right artificial shoulder joint: Secondary | ICD-10-CM | POA: Diagnosis not present

## 2019-11-28 DIAGNOSIS — M19012 Primary osteoarthritis, left shoulder: Secondary | ICD-10-CM | POA: Diagnosis not present

## 2019-11-28 DIAGNOSIS — M19011 Primary osteoarthritis, right shoulder: Secondary | ICD-10-CM | POA: Diagnosis not present

## 2019-11-28 DIAGNOSIS — M25512 Pain in left shoulder: Secondary | ICD-10-CM | POA: Diagnosis not present

## 2019-11-30 DIAGNOSIS — M6281 Muscle weakness (generalized): Secondary | ICD-10-CM | POA: Diagnosis not present

## 2019-11-30 DIAGNOSIS — M19011 Primary osteoarthritis, right shoulder: Secondary | ICD-10-CM | POA: Diagnosis not present

## 2019-11-30 DIAGNOSIS — M19012 Primary osteoarthritis, left shoulder: Secondary | ICD-10-CM | POA: Diagnosis not present

## 2019-11-30 DIAGNOSIS — M25511 Pain in right shoulder: Secondary | ICD-10-CM | POA: Diagnosis not present

## 2019-11-30 DIAGNOSIS — Z96611 Presence of right artificial shoulder joint: Secondary | ICD-10-CM | POA: Diagnosis not present

## 2019-11-30 DIAGNOSIS — M25512 Pain in left shoulder: Secondary | ICD-10-CM | POA: Diagnosis not present

## 2019-12-01 DIAGNOSIS — M25512 Pain in left shoulder: Secondary | ICD-10-CM | POA: Diagnosis not present

## 2019-12-01 DIAGNOSIS — M19011 Primary osteoarthritis, right shoulder: Secondary | ICD-10-CM | POA: Diagnosis not present

## 2019-12-01 DIAGNOSIS — Z96611 Presence of right artificial shoulder joint: Secondary | ICD-10-CM | POA: Diagnosis not present

## 2019-12-01 DIAGNOSIS — M6281 Muscle weakness (generalized): Secondary | ICD-10-CM | POA: Diagnosis not present

## 2019-12-01 DIAGNOSIS — M25511 Pain in right shoulder: Secondary | ICD-10-CM | POA: Diagnosis not present

## 2019-12-01 DIAGNOSIS — M19012 Primary osteoarthritis, left shoulder: Secondary | ICD-10-CM | POA: Diagnosis not present

## 2019-12-05 DIAGNOSIS — J3081 Allergic rhinitis due to animal (cat) (dog) hair and dander: Secondary | ICD-10-CM | POA: Diagnosis not present

## 2019-12-05 DIAGNOSIS — J3089 Other allergic rhinitis: Secondary | ICD-10-CM | POA: Diagnosis not present

## 2019-12-05 DIAGNOSIS — J301 Allergic rhinitis due to pollen: Secondary | ICD-10-CM | POA: Diagnosis not present

## 2019-12-05 DIAGNOSIS — H1045 Other chronic allergic conjunctivitis: Secondary | ICD-10-CM | POA: Diagnosis not present

## 2019-12-07 DIAGNOSIS — M25511 Pain in right shoulder: Secondary | ICD-10-CM | POA: Diagnosis not present

## 2019-12-07 DIAGNOSIS — M19011 Primary osteoarthritis, right shoulder: Secondary | ICD-10-CM | POA: Diagnosis not present

## 2019-12-07 DIAGNOSIS — Z96611 Presence of right artificial shoulder joint: Secondary | ICD-10-CM | POA: Diagnosis not present

## 2019-12-07 DIAGNOSIS — M6281 Muscle weakness (generalized): Secondary | ICD-10-CM | POA: Diagnosis not present

## 2019-12-07 DIAGNOSIS — M19012 Primary osteoarthritis, left shoulder: Secondary | ICD-10-CM | POA: Diagnosis not present

## 2019-12-07 DIAGNOSIS — M25512 Pain in left shoulder: Secondary | ICD-10-CM | POA: Diagnosis not present

## 2019-12-08 DIAGNOSIS — M19012 Primary osteoarthritis, left shoulder: Secondary | ICD-10-CM | POA: Diagnosis not present

## 2019-12-08 DIAGNOSIS — M19011 Primary osteoarthritis, right shoulder: Secondary | ICD-10-CM | POA: Diagnosis not present

## 2019-12-08 DIAGNOSIS — Z96611 Presence of right artificial shoulder joint: Secondary | ICD-10-CM | POA: Diagnosis not present

## 2019-12-08 DIAGNOSIS — M25512 Pain in left shoulder: Secondary | ICD-10-CM | POA: Diagnosis not present

## 2019-12-08 DIAGNOSIS — M25511 Pain in right shoulder: Secondary | ICD-10-CM | POA: Diagnosis not present

## 2019-12-08 DIAGNOSIS — M6281 Muscle weakness (generalized): Secondary | ICD-10-CM | POA: Diagnosis not present

## 2019-12-11 DIAGNOSIS — M25511 Pain in right shoulder: Secondary | ICD-10-CM | POA: Diagnosis not present

## 2019-12-11 DIAGNOSIS — Z96611 Presence of right artificial shoulder joint: Secondary | ICD-10-CM | POA: Diagnosis not present

## 2019-12-11 DIAGNOSIS — M25512 Pain in left shoulder: Secondary | ICD-10-CM | POA: Diagnosis not present

## 2019-12-11 DIAGNOSIS — M6281 Muscle weakness (generalized): Secondary | ICD-10-CM | POA: Diagnosis not present

## 2019-12-11 DIAGNOSIS — M19012 Primary osteoarthritis, left shoulder: Secondary | ICD-10-CM | POA: Diagnosis not present

## 2019-12-11 DIAGNOSIS — M19011 Primary osteoarthritis, right shoulder: Secondary | ICD-10-CM | POA: Diagnosis not present

## 2019-12-14 DIAGNOSIS — M25512 Pain in left shoulder: Secondary | ICD-10-CM | POA: Diagnosis not present

## 2019-12-14 DIAGNOSIS — M25511 Pain in right shoulder: Secondary | ICD-10-CM | POA: Diagnosis not present

## 2019-12-14 DIAGNOSIS — Z96611 Presence of right artificial shoulder joint: Secondary | ICD-10-CM | POA: Diagnosis not present

## 2019-12-14 DIAGNOSIS — M19012 Primary osteoarthritis, left shoulder: Secondary | ICD-10-CM | POA: Diagnosis not present

## 2019-12-14 DIAGNOSIS — M19011 Primary osteoarthritis, right shoulder: Secondary | ICD-10-CM | POA: Diagnosis not present

## 2019-12-14 DIAGNOSIS — M6281 Muscle weakness (generalized): Secondary | ICD-10-CM | POA: Diagnosis not present

## 2019-12-18 DIAGNOSIS — M19012 Primary osteoarthritis, left shoulder: Secondary | ICD-10-CM | POA: Diagnosis not present

## 2019-12-18 DIAGNOSIS — M6281 Muscle weakness (generalized): Secondary | ICD-10-CM | POA: Diagnosis not present

## 2019-12-18 DIAGNOSIS — M25511 Pain in right shoulder: Secondary | ICD-10-CM | POA: Diagnosis not present

## 2019-12-18 DIAGNOSIS — M19011 Primary osteoarthritis, right shoulder: Secondary | ICD-10-CM | POA: Diagnosis not present

## 2019-12-18 DIAGNOSIS — M25512 Pain in left shoulder: Secondary | ICD-10-CM | POA: Diagnosis not present

## 2019-12-18 DIAGNOSIS — Z96611 Presence of right artificial shoulder joint: Secondary | ICD-10-CM | POA: Diagnosis not present

## 2019-12-20 ENCOUNTER — Other Ambulatory Visit: Payer: Self-pay

## 2019-12-20 ENCOUNTER — Ambulatory Visit: Payer: Medicare Other | Admitting: Internal Medicine

## 2019-12-20 ENCOUNTER — Encounter: Payer: Self-pay | Admitting: Internal Medicine

## 2019-12-20 VITALS — BP 128/68 | HR 76

## 2019-12-20 DIAGNOSIS — K644 Residual hemorrhoidal skin tags: Secondary | ICD-10-CM | POA: Diagnosis not present

## 2019-12-20 DIAGNOSIS — R159 Full incontinence of feces: Secondary | ICD-10-CM | POA: Diagnosis not present

## 2019-12-20 NOTE — Progress Notes (Signed)
Subjective:    Patient ID: [name] [name], female    DOB: [birth year], 86 y.o.   MRN: [id number]  HPI  Asked to see resident in [address] Resident reports she has had rectal leakage for the last 4 months. She reports if she stands up to walk or passes gas, she will have some leakage from her rectum. She reports this is not diarrhea. She has a BM daily, denies constipation. She occasionally has blood in her stool but has external hemorrhoids. She denies rectal pain or itching. She denies abdominal pain. She wears pads every day but reports the leakage is noticeable every time she goes to the restroom to change her pad. She has not tried any medications for this.  Review of Systems      Past Medical History:  Diagnosis Date  . Allergic rhinitis due to pollen    some shellfish also  . Aortic sclerosis 5/12   On echo. no stenosis  . Arthritis   . Diastolic dysfunction    stress echo otherwise normal 1/04. Repeat 5/11 also negative  . History of seasonal allergies   . Hx of basal cell carcinoma 1990   multiple sites  . Hx of squamous cell carcinoma of skin 2000   R nasolabial fold  . Hyperlipidemia   . Osteoarthritis, multiple sites   . Osteoporosis   . Overactive bladder   . Urge incontinence   . Vaginal odor 04/2018   aerobic vaginitis on One Swab culture    Current Outpatient Medications  Medication Sig Dispense Refill  . acetaminophen (TYLENOL) 650 MG CR tablet Take 650 mg by mouth 3 (three) times daily.    . celecoxib (CELEBREX) 200 MG capsule TAKE 1 CAPSULE DAILY 90 capsule 3  . CVS VITAMIN D3 1000 UNITS capsule TAKE ONE CAPSULE BY MOUTH DAILY 30 capsule 0  . dorzolamide-timolol (COSOPT) 22.3-6.8 MG/ML ophthalmic solution Apply to eye.    Marland Kitchen EPIPEN 2-PAK 0.3 MG/0.3ML SOAJ injection Inject into the muscle once.    . fluticasone (FLONASE) 50 MCG/ACT nasal spray Place 1 spray into both nostrils 2 (two) times a day.    . latanoprost (XALATAN) 0.005 % ophthalmic solution       . levocetirizine (XYZAL) 5 MG tablet Take 5 mg by mouth every evening.    . lidocaine (LIDODERM) 5 % Place 1 patch onto the skin daily. Remove & Discard patch within 12 hours or as directed by MD 30 patch 0  . Melatonin 10 MG TABS Take by mouth.    . Multiple Vitamin (MULTIVITAMIN) tablet Take 1 tablet by mouth daily.    Marland Kitchen MYRBETRIQ 25 MG TB24 tablet     . omeprazole (PRILOSEC) 20 MG capsule Take 1 capsule (20 mg total) by mouth daily. 30 capsule 3  . polyethylene glycol powder (GLYCOLAX/MIRALAX) 17 GM/SCOOP powder Take by mouth.    . Probiotic Product (PROBIOTIC ADVANCED PO) Take 1 capsule by mouth daily.    . Simethicone (GAS-X PO) Take by mouth 2 (two) times daily.     No current facility-administered medications for this visit.    Allergies  Allergen Reactions  . Demerol [Meperidine]     dizzy  . Shellfish Allergy   . Avelox [Moxifloxacin Hcl In Nacl] Rash    Family History  Problem Relation Age of Onset  . Cancer Father 83       colon cancer  . Heart disease Father     Social History   Socioeconomic History  .  Marital status: Widowed    Spouse name: Not on file  . Number of children: 3  . Years of education: Not on file  . Highest education level: Not on file  Occupational History  . Occupation: Pharmacist, hospital    Comment: short time  . Occupation: Herbalist    Comment: most of career  Tobacco Use  . Smoking status: Former Research scientist (life sciences)  . Smokeless tobacco: Never Used  Substance and Sexual Activity  . Alcohol use: Yes    Comment: wine before dinner  . Drug use: No  . Sexual activity: Not Currently    Birth control/protection: Post-menopausal  Other Topics Concern  . Not on file  Social History Narrative   Widowed 2019   Has living will   Daughter [name], is health care POA   Would accept trial of resuscitation   Would probably accept feeding tube   Social Determinants of Health   Financial Resource Strain:   . Difficulty of Paying Living Expenses:   Food  Insecurity:   . Worried About Charity fundraiser in the Last Year:   . Arboriculturist in the Last Year:   Transportation Needs:   . Film/video editor (Medical):   Marland Kitchen Lack of Transportation (Non-Medical):   Physical Activity:   . Days of Exercise per Week:   . Minutes of Exercise per Session:   Stress:   . Feeling of Stress :   Social Connections:   . Frequency of Communication with Friends and Family:   . Frequency of Social Gatherings with Friends and Family:   . Attends Religious Services:   . Active Member of Clubs or Organizations:   . Attends Archivist Meetings:   Marland Kitchen Marital Status:   Intimate Partner Violence:   . Fear of Current or Ex-Partner:   . Emotionally Abused:   Marland Kitchen Physically Abused:   . Sexually Abused:      Constitutional: Denies fever, malaise, fatigue, headache or abrupt weight changes.  Respiratory: Denies difficulty breathing, shortness of breath, cough or sputum production.   Cardiovascular: Denies chest pain, chest tightness, palpitations or swelling in the hands or feet.  Gastrointestinal: Pt reports rectal leakage. Denies abdominal pain, bloating, constipation, diarrhea or blood in the stool.   No other specific complaints in a complete review of systems (except as listed in HPI above).  Objective:   Physical Exam    BP 128/68   Pulse 76  Wt Readings from Last 3 Encounters:  10/26/19 158 lb (71.7 kg)  10/24/19 160 lb (72.6 kg)  10/09/19 161 lb (73 kg)    General: Appears her stated age, well developed, well nourished in NAD. Cardiovascular: Normal rate. Pulmonary/Chest: Normal effort.  Abdomen: Soft and nontender. Normal bowel sounds. No distention or masses noted.  Rectal: Normal rectal tone. External hemorrhoids noted- non bleeding or thrombosed. Clear, gelatinous mucous noted around the anus.  BMET    Component Value Date/Time   NA 133 (L) 03/07/2018 1604   K 4.7 03/07/2018 1604   CL 99 03/07/2018 1604   CO2 29  03/07/2018 1604   GLUCOSE 102 (H) 03/07/2018 1604   BUN 18 03/07/2018 1604   CREATININE 0.77 03/07/2018 1604   CALCIUM 9.5 03/07/2018 1604   GFRNONAA >60 04/28/2017 0412   GFRAA >60 04/28/2017 0412    Lipid Panel     Component Value Date/Time   CHOL 232 (H) 02/06/2013 1108   TRIG 72.0 02/06/2013 1108   HDL 77.50 02/06/2013 1108  CHOLHDL 3 02/06/2013 1108   VLDL 14.4 02/06/2013 1108    CBC    Component Value Date/Time   WBC 8.4 03/07/2018 1604   RBC 4.07 03/07/2018 1604   HGB 13.0 03/07/2018 1604   HCT 38.6 03/07/2018 1604   PLT 288.0 03/07/2018 1604   MCV 94.9 03/07/2018 1604   MCH 31.4 04/28/2017 0412   MCHC 33.6 03/07/2018 1604   RDW 14.0 03/07/2018 1604   LYMPHSABS 4.2 (H) 04/28/2017 0412   MONOABS 1.1 (H) 04/28/2017 0412   EOSABS 0.0 04/28/2017 0412   BASOSABS 0.0 04/28/2017 0412    Hgb A1C No results found for: HGBA1C        Assessment & Plan:   Rectal Leakage, External Hemorrhoids:  Will d/w Dr. Silvio Pate re: further evaluation/treatment plan  Will follow up with Laurence Aly, after discussing with PCP Webb Silversmith, NP This visit occurred during the SARS-CoV-2 public health emergency.  Safety protocols were in place, including screening questions prior to the visit, additional usage of staff PPE, and extensive cleaning of exam room while observing appropriate contact time as indicated for disinfecting solutions.

## 2019-12-20 NOTE — Patient Instructions (Signed)
Hemorrhoids Hemorrhoids are swollen veins that may develop:  In the butt (rectum). These are called internal hemorrhoids.  Around the opening of the butt (anus). These are called external hemorrhoids. Hemorrhoids can cause pain, itching, or bleeding. Most of the time, they do not cause serious problems. They usually get better with diet changes, lifestyle changes, and other home treatments. What are the causes? This condition may be caused by:  Having trouble pooping (constipation).  Pushing hard (straining) to poop.  Watery poop (diarrhea).  Pregnancy.  Being very overweight (obese).  Sitting for long periods of time.  Heavy lifting or other activity that causes you to strain.  Anal sex.  Riding a bike for a long period of time. What are the signs or symptoms? Symptoms of this condition include:  Pain.  Itching or soreness in the butt.  Bleeding from the butt.  Leaking poop.  Swelling in the area.  One or more lumps around the opening of your butt. How is this diagnosed? A doctor can often diagnose this condition by looking at the affected area. The doctor may also:  Do an exam that involves feeling the area with a gloved hand (digital rectal exam).  Examine the area inside your butt using a small tube (anoscope).  Order blood tests. This may be done if you have lost a lot of blood.  Have you get a test that involves looking inside the colon using a flexible tube with a camera on the end (sigmoidoscopy or colonoscopy). How is this treated? This condition can usually be treated at home. Your doctor may tell you to change what you eat, make lifestyle changes, or try home treatments. If these do not help, procedures can be done to remove the hemorrhoids or make them smaller. These may involve:  Placing rubber bands at the base of the hemorrhoids to cut off their blood supply.  Injecting medicine into the hemorrhoids to shrink them.  Shining a type of light  energy onto the hemorrhoids to cause them to fall off.  Doing surgery to remove the hemorrhoids or cut off their blood supply. Follow these instructions at home: Eating and drinking   Eat foods that have a lot of fiber in them. These include whole grains, beans, nuts, fruits, and vegetables.  Ask your doctor about taking products that have added fiber (fibersupplements).  Reduce the amount of fat in your diet. You can do this by: ? Eating low-fat dairy products. ? Eating less red meat. ? Avoiding processed foods.  Drink enough fluid to keep your pee (urine) pale yellow. Managing pain and swelling   Take a warm-water bath (sitz bath) for 20 minutes to ease pain. Do this 3-4 times a day. You may do this in a bathtub or using a portable sitz bath that fits over the toilet.  If told, put ice on the painful area. It may be helpful to use ice between your warm baths. ? Put ice in a plastic bag. ? Place a towel between your skin and the bag. ? Leave the ice on for 20 minutes, 2-3 times a day. General instructions  Take over-the-counter and prescription medicines only as told by your doctor. ? Medicated creams and medicines may be used as told.  Exercise often. Ask your doctor how much and what kind of exercise is best for you.  Go to the bathroom when you have the urge to poop. Do not wait.  Avoid pushing too hard when you poop.  Keep your   butt dry and clean. Use wet toilet paper or moist towelettes after pooping.  Do not sit on the toilet for a long time.  Keep all follow-up visits as told by your doctor. This is important. Contact a doctor if you:  Have pain and swelling that do not get better with treatment or medicine.  Have trouble pooping.  Cannot poop.  Have pain or swelling outside the area of the hemorrhoids. Get help right away if you have:  Bleeding that will not stop. Summary  Hemorrhoids are swollen veins in the butt or around the opening of the  butt.  They can cause pain, itching, or bleeding.  Eat foods that have a lot of fiber in them. These include whole grains, beans, nuts, fruits, and vegetables.  Take a warm-water bath (sitz bath) for 20 minutes to ease pain. Do this 3-4 times a day. This information is not intended to replace advice given to you by your health care provider. Make sure you discuss any questions you have with your health care provider. Document Revised: 07/28/2018 Document Reviewed: 12/09/2017 Elsevier Patient Education  2020 [employer] Inc[employer]  

## 2019-12-21 DIAGNOSIS — M6281 Muscle weakness (generalized): Secondary | ICD-10-CM | POA: Diagnosis not present

## 2019-12-21 DIAGNOSIS — Z96611 Presence of right artificial shoulder joint: Secondary | ICD-10-CM | POA: Diagnosis not present

## 2019-12-21 DIAGNOSIS — M19011 Primary osteoarthritis, right shoulder: Secondary | ICD-10-CM | POA: Diagnosis not present

## 2019-12-21 DIAGNOSIS — M25512 Pain in left shoulder: Secondary | ICD-10-CM | POA: Diagnosis not present

## 2019-12-21 DIAGNOSIS — M25511 Pain in right shoulder: Secondary | ICD-10-CM | POA: Diagnosis not present

## 2019-12-21 DIAGNOSIS — M19012 Primary osteoarthritis, left shoulder: Secondary | ICD-10-CM | POA: Diagnosis not present

## 2019-12-22 DIAGNOSIS — M19011 Primary osteoarthritis, right shoulder: Secondary | ICD-10-CM | POA: Diagnosis not present

## 2019-12-22 DIAGNOSIS — M25512 Pain in left shoulder: Secondary | ICD-10-CM | POA: Diagnosis not present

## 2019-12-22 DIAGNOSIS — Z96611 Presence of right artificial shoulder joint: Secondary | ICD-10-CM | POA: Diagnosis not present

## 2019-12-22 DIAGNOSIS — M19012 Primary osteoarthritis, left shoulder: Secondary | ICD-10-CM | POA: Diagnosis not present

## 2019-12-22 DIAGNOSIS — M6281 Muscle weakness (generalized): Secondary | ICD-10-CM | POA: Diagnosis not present

## 2019-12-22 DIAGNOSIS — M25511 Pain in right shoulder: Secondary | ICD-10-CM | POA: Diagnosis not present

## 2019-12-26 DIAGNOSIS — M25511 Pain in right shoulder: Secondary | ICD-10-CM | POA: Diagnosis not present

## 2019-12-26 DIAGNOSIS — M25512 Pain in left shoulder: Secondary | ICD-10-CM | POA: Diagnosis not present

## 2019-12-26 DIAGNOSIS — M19012 Primary osteoarthritis, left shoulder: Secondary | ICD-10-CM | POA: Diagnosis not present

## 2019-12-26 DIAGNOSIS — M19011 Primary osteoarthritis, right shoulder: Secondary | ICD-10-CM | POA: Diagnosis not present

## 2019-12-26 DIAGNOSIS — M6281 Muscle weakness (generalized): Secondary | ICD-10-CM | POA: Diagnosis not present

## 2019-12-26 DIAGNOSIS — Z96611 Presence of right artificial shoulder joint: Secondary | ICD-10-CM | POA: Diagnosis not present

## 2019-12-28 ENCOUNTER — Encounter: Payer: Self-pay | Admitting: Internal Medicine

## 2019-12-28 DIAGNOSIS — M25512 Pain in left shoulder: Secondary | ICD-10-CM | POA: Diagnosis not present

## 2019-12-28 DIAGNOSIS — I5030 Unspecified diastolic (congestive) heart failure: Secondary | ICD-10-CM | POA: Diagnosis not present

## 2019-12-28 DIAGNOSIS — M19012 Primary osteoarthritis, left shoulder: Secondary | ICD-10-CM | POA: Diagnosis not present

## 2019-12-28 DIAGNOSIS — N3941 Urge incontinence: Secondary | ICD-10-CM | POA: Diagnosis not present

## 2019-12-28 DIAGNOSIS — M25511 Pain in right shoulder: Secondary | ICD-10-CM | POA: Diagnosis not present

## 2019-12-28 DIAGNOSIS — M6281 Muscle weakness (generalized): Secondary | ICD-10-CM | POA: Diagnosis not present

## 2019-12-28 DIAGNOSIS — Z96611 Presence of right artificial shoulder joint: Secondary | ICD-10-CM | POA: Diagnosis not present

## 2019-12-28 DIAGNOSIS — I10 Essential (primary) hypertension: Secondary | ICD-10-CM | POA: Diagnosis not present

## 2019-12-28 DIAGNOSIS — M1990 Unspecified osteoarthritis, unspecified site: Secondary | ICD-10-CM | POA: Diagnosis not present

## 2019-12-28 DIAGNOSIS — M19011 Primary osteoarthritis, right shoulder: Secondary | ICD-10-CM | POA: Diagnosis not present

## 2020-01-15 DIAGNOSIS — M75122 Complete rotator cuff tear or rupture of left shoulder, not specified as traumatic: Secondary | ICD-10-CM | POA: Insufficient documentation

## 2020-01-24 ENCOUNTER — Ambulatory Visit: Payer: Medicare Other | Admitting: Internal Medicine

## 2020-01-24 DIAGNOSIS — I5032 Chronic diastolic (congestive) heart failure: Secondary | ICD-10-CM

## 2020-01-24 DIAGNOSIS — M8949 Other hypertrophic osteoarthropathy, multiple sites: Secondary | ICD-10-CM

## 2020-01-24 DIAGNOSIS — K219 Gastro-esophageal reflux disease without esophagitis: Secondary | ICD-10-CM

## 2020-01-24 DIAGNOSIS — M159 Polyosteoarthritis, unspecified: Secondary | ICD-10-CM

## 2020-01-24 DIAGNOSIS — N3941 Urge incontinence: Secondary | ICD-10-CM | POA: Diagnosis not present

## 2020-01-24 DIAGNOSIS — G4700 Insomnia, unspecified: Secondary | ICD-10-CM

## 2020-01-27 ENCOUNTER — Encounter: Payer: Self-pay | Admitting: Internal Medicine

## 2020-01-27 DIAGNOSIS — G4700 Insomnia, unspecified: Secondary | ICD-10-CM | POA: Insufficient documentation

## 2020-01-27 DIAGNOSIS — K219 Gastro-esophageal reflux disease without esophagitis: Secondary | ICD-10-CM | POA: Insufficient documentation

## 2020-01-27 NOTE — Assessment & Plan Note (Signed)
Continue Myrbetriq 

## 2020-01-27 NOTE — Patient Instructions (Signed)
Barrett's Esophagus ° °Barrett's esophagus occurs when the tissue that lines the esophagus changes or becomes damaged. The esophagus is the tube that carries food from the throat to the stomach. With Barrett's esophagus, the cells that line the esophagus are replaced by cells that are similar to the lining of the intestines (intestinal metaplasia). °Barrett's esophagus itself may not cause any symptoms. However, many people who have Barrett's esophagus also have gastroesophageal reflux disease (GERD), which may cause symptoms such as heartburn. Over time, a few people with this condition may develop cancer of the esophagus. Treatment may include medicines, procedures to destroy the abnormal cells, or surgery. °What are the causes? °The exact cause of this condition is not known. In some cases, the condition develops from damage to the lining of the esophagus caused by GERD. GERD occurs when stomach acids flow up from the stomach into the esophagus. Frequent symptoms of GERD may cause intestinal metaplasia or cause cell changes (dysplasia). °What increases the risk? °You are more likely to develop this condition if you: °· Have GERD. °· Are female. °· Are Caucasian. °· Are obese. °· Are older than 50. °· Have a hiatal hernia. This is a condition in which part of your stomach bulges into your chest. °· Smoke. °What are the signs or symptoms? °People with Barrett's esophagus often have no symptoms. However, many people with this condition also have GERD. Symptoms of GERD may include: °· Heartburn. °· Difficulty swallowing. °· Dry cough. °How is this diagnosed? °This condition may be diagnosed based on: °· Results of an upper gastrointestinal endoscopy. For this exam, a thin, flexible tube with a light and a camera on the end (endoscope) is passed down your esophagus. Your health care provider can view the inside of your esophagus during this procedure. °· Results of a biopsy. For this procedure, several tissue samples  are removed (biopsy) from your esophagus. They are then checked for intestinal metaplasia or dysplasia. °How is this treated? °Treatment for this condition may include: °· Medicines (proton pump inhibitors, or PPIs) to decrease or stop GERD. °· Periodic endoscopic exams to make sure that cancer is not developing. °· A procedure or surgery for dysplasia. This may include: °? Removal or destruction of abnormal cells. °? Removal of part of the esophagus (esophagectomy). °Follow these instructions at home: °Eating and drinking °· Eat more fruits and vegetables. °· Avoid fatty foods. °· Eat small, frequent meals instead of large meals. °· Avoid foods that cause heartburn. These foods include: °? Coffee and alcoholic drinks. °? Tomatoes and foods made with tomatoes. °? Greasy or spicy foods. °? Chocolate and peppermint. °· Do not drink alcohol. °General instructions °· Take over-the-counter and prescription medicines only as told by your health care provider. °· Do not use any products that contain nicotine or tobacco, such as cigarettes and e-cigarettes. If you need help quitting, ask your health care provider. °· If you are being treated for GERD, make sure you take medicines and follow all instructions as told by your health care provider. °· Keep all follow-up visits as told by your health care provider. This is important. °Contact a health care provider if: °· You have heartburn or GERD symptoms. °· You have difficulty swallowing. °Get help right away if: °· You have chest pain. °· You are unable to swallow. °· You vomit blood or material that looks like coffee grounds. °· Your stool (feces) is bright red or dark. °Summary °· Barrett's esophagus occurs when the tissue that lines   the esophagus changes or becomes damaged. °· Barrett's esophagus may be diagnosed with an upper gastrointestinal endoscopy and a biopsy. °· Treatment may include medicines, procedures to remove abnormal cells, or surgery. °· Follow your  health care provider's instructions about what to eat and drink, what medicines to take, and when to call for help. °This information is not intended to replace advice given to you by your health care provider. Make sure you discuss any questions you have with your health care provider. °Document Revised: 11/15/2017 Document Reviewed: 11/15/2017 °Elsevier Patient Education © 2020 [employer] Inc. ° °

## 2020-01-27 NOTE — Assessment & Plan Note (Signed)
Compensated Will monitor

## 2020-01-27 NOTE — Assessment & Plan Note (Signed)
Continue Melatonin 

## 2020-01-27 NOTE — Assessment & Plan Note (Signed)
Continue Omeprazole ?

## 2020-01-27 NOTE — Assessment & Plan Note (Signed)
Continue Celebrex, Tylenol and Lidoderm patches

## 2020-01-27 NOTE — Progress Notes (Signed)
Subjective:    Patient ID: [name] [name], female    DOB: 1924/05/28, 86 y.o.   MRN: [id number]  HPI  Resident seen in a [address] for routine follow-up No new concerns from staff.  Resident concerned about ongoing anal leakage.  Some improvement with cholecystyremine. She sleeps well for the most part, wakes up intermittently.  She is independent with ADLs.  She walks with a walker, denies recent falls.  Her weight is up 4 pounds.  She has persistent abdominal issues, controlled with a probiotic, simethicone, MiraLAX.  She denies chest pain or reflux.  She has some shortness of breath.  OA: Managed on Celebrex and Tylenol.  She uses Lidoderm Derm patches with good relief of symptoms.  Insomnia: She reports she is sleeping well with the use of Melatonin.  Urge Incontinence: Managed on Myrbetriq.  CHF, Diastolic: She has mild SOB with exertion, stable- no edema.  GERD: She denies breakthrough on Omeprazole.  Review of Systems      Past Medical History:  Diagnosis Date  . Allergic rhinitis due to pollen    some shellfish also  . Aortic sclerosis 5/12   On echo. no stenosis  . Arthritis   . Diastolic dysfunction    stress echo otherwise normal 1/04. Repeat 5/11 also negative  . History of seasonal allergies   . Hx of basal cell carcinoma 1990   multiple sites  . Hx of squamous cell carcinoma of skin 2000   R nasolabial fold  . Hyperlipidemia   . Osteoarthritis, multiple sites   . Osteoporosis   . Overactive bladder   . Urge incontinence   . Vaginal odor 04/2018   aerobic vaginitis on One Swab culture    Current Outpatient Medications  Medication Sig Dispense Refill  . acetaminophen (TYLENOL) 650 MG CR tablet Take 650 mg by mouth 3 (three) times daily.    . celecoxib (CELEBREX) 200 MG capsule TAKE 1 CAPSULE DAILY 90 capsule 3  . CVS VITAMIN D3 1000 UNITS capsule TAKE ONE CAPSULE BY MOUTH DAILY 30 capsule 0  . dorzolamide-timolol (COSOPT) 22.3-6.8 MG/ML ophthalmic  solution Apply to eye.    Marland Kitchen EPIPEN 2-PAK 0.3 MG/0.3ML SOAJ injection Inject into the muscle once.    . fluticasone (FLONASE) 50 MCG/ACT nasal spray Place 1 spray into both nostrils 2 (two) times a day.    . latanoprost (XALATAN) 0.005 % ophthalmic solution     . levocetirizine (XYZAL) 5 MG tablet Take 5 mg by mouth every evening.    . lidocaine (LIDODERM) 5 % Place 1 patch onto the skin daily. Remove & Discard patch within 12 hours or as directed by MD 30 patch 0  . Melatonin 10 MG TABS Take by mouth.    . Multiple Vitamin (MULTIVITAMIN) tablet Take 1 tablet by mouth daily.    Marland Kitchen MYRBETRIQ 25 MG TB24 tablet     . omeprazole (PRILOSEC) 20 MG capsule Take 1 capsule (20 mg total) by mouth daily. 30 capsule 3  . polyethylene glycol powder (GLYCOLAX/MIRALAX) 17 GM/SCOOP powder Take by mouth.    . Probiotic Product (PROBIOTIC ADVANCED PO) Take 1 capsule by mouth daily.    . Simethicone (GAS-X PO) Take by mouth 2 (two) times daily.     No current facility-administered medications for this visit.    Allergies  Allergen Reactions  . Demerol [Meperidine]     dizzy  . Shellfish Allergy   . Avelox [Moxifloxacin Hcl In Nacl] Rash    Family  History  Problem Relation Age of Onset  . Cancer Father 55       colon cancer  . Heart disease Father     Social History   Socioeconomic History  . Marital status: Widowed    Spouse name: Not on file  . Number of children: 3  . Years of education: Not on file  . Highest education level: Not on file  Occupational History  . Occupation: Pharmacist, hospital    Comment: short time  . Occupation: Herbalist    Comment: most of career  Tobacco Use  . Smoking status: Former Research scientist (life sciences)  . Smokeless tobacco: Never Used  Vaping Use  . Vaping Use: Never used  Substance and Sexual Activity  . Alcohol use: Yes    Comment: wine before dinner  . Drug use: No  . Sexual activity: Not Currently    Birth control/protection: Post-menopausal  Other Topics Concern  . Not  on file  Social History Narrative   Widowed 2019   Has living will   Daughter [name], is health care POA   Would accept trial of resuscitation   Would probably accept feeding tube   Social Determinants of Health   Financial Resource Strain:   . Difficulty of Paying Living Expenses:   Food Insecurity:   . Worried About Charity fundraiser in the Last Year:   . Arboriculturist in the Last Year:   Transportation Needs:   . Film/video editor (Medical):   Marland Kitchen Lack of Transportation (Non-Medical):   Physical Activity:   . Days of Exercise per Week:   . Minutes of Exercise per Session:   Stress:   . Feeling of Stress :   Social Connections:   . Frequency of Communication with Friends and Family:   . Frequency of Social Gatherings with Friends and Family:   . Attends Religious Services:   . Active Member of Clubs or Organizations:   . Attends Archivist Meetings:   Marland Kitchen Marital Status:   Intimate Partner Violence:   . Fear of Current or Ex-Partner:   . Emotionally Abused:   Marland Kitchen Physically Abused:   . Sexually Abused:      Constitutional: Denies fever, malaise, fatigue, headache or abrupt weight changes.  HEENT: Denies eye pain, eye redness, ear pain, ringing in the ears, wax buildup, runny nose, nasal congestion, bloody nose, or sore throat. Respiratory: Pt reports intermittent shortness of breath with exertion. Denies difficulty breathing, cough or sputum production.   Cardiovascular: Denies chest pain, chest tightness, palpitations or swelling in the hands or feet.  Gastrointestinal: Denies abdominal pain, bloating, constipation, diarrhea or blood in the stool.  GU: Pt reports urinary urgency. Denies frequency, pain with urination, burning sensation, blood in urine, odor or discharge. Musculoskeletal: Pt reports intermittent joint pain. Denies decrease in range of motion, difficulty with gait, muscle pain or joint swelling.  Skin: Denies redness, rashes, lesions or  ulcercations.  Neurological: Denies dizziness, difficulty with memory, difficulty with speech or problems with balance and coordination.  Psych: Denies anxiety, depression, SI/HI.  No other specific complaints in a complete review of systems (except as listed in HPI above).  Objective:   Physical Exam    BP 132/62   Pulse (!) 56   Temp 97.9 F (36.6 C)   Resp 17   Wt 159 lb 6.4 oz (72.3 kg)   BMI 26.53 kg/m  Wt Readings from Last 3 Encounters:  01/27/20 159 lb 6.4 oz (72.3  kg)  10/26/19 158 lb (71.7 kg)  10/24/19 160 lb (72.6 kg)    General: Appears her stated age, well developed, well nourished in NAD. Skin: Warm, dry and intact. No ulcerations noted. HEENT: Head: normal shape and size;  Neck:  Neck supple, trachea midline. No masses, lumps or thyromegaly present.  Cardiovascular: Normal rate and rhythm. S1,S2 noted.  No murmur, rubs or gallops noted. No JVD or BLE edema.  Pulmonary/Chest: Normal effort and positive vesicular breath sounds. No respiratory distress. No wheezes, rales or ronchi noted.  Abdomen: Soft and nontender. Normal bowel sounds.  Neurological: Alert and oriented.  Psychiatric: Mood and affect normal. Behavior is normal. Judgment and thought content normal.     BMET    Component Value Date/Time   NA 133 (L) 03/07/2018 1604   K 4.7 03/07/2018 1604   CL 99 03/07/2018 1604   CO2 29 03/07/2018 1604   GLUCOSE 102 (H) 03/07/2018 1604   BUN 18 03/07/2018 1604   CREATININE 0.77 03/07/2018 1604   CALCIUM 9.5 03/07/2018 1604   GFRNONAA >60 04/28/2017 0412   GFRAA >60 04/28/2017 0412    Lipid Panel     Component Value Date/Time   CHOL 232 (H) 02/06/2013 1108   TRIG 72.0 02/06/2013 1108   HDL 77.50 02/06/2013 1108   CHOLHDL 3 02/06/2013 1108   VLDL 14.4 02/06/2013 1108    CBC    Component Value Date/Time   WBC 8.4 03/07/2018 1604   RBC 4.07 03/07/2018 1604   HGB 13.0 03/07/2018 1604   HCT 38.6 03/07/2018 1604   PLT 288.0 03/07/2018 1604    MCV 94.9 03/07/2018 1604   MCH 31.4 04/28/2017 0412   MCHC 33.6 03/07/2018 1604   RDW 14.0 03/07/2018 1604   LYMPHSABS 4.2 (H) 04/28/2017 0412   MONOABS 1.1 (H) 04/28/2017 0412   EOSABS 0.0 04/28/2017 0412   BASOSABS 0.0 04/28/2017 0412    Hgb A1C No results found for: HGBA1C        Assessment & Plan:

## 2020-03-20 ENCOUNTER — Other Ambulatory Visit: Payer: Self-pay | Admitting: Gastroenterology

## 2020-03-20 DIAGNOSIS — R1032 Left lower quadrant pain: Secondary | ICD-10-CM

## 2020-03-29 ENCOUNTER — Ambulatory Visit
Admission: RE | Admit: 2020-03-29 | Discharge: 2020-03-29 | Disposition: A | Discharge status: Home / Self Care | Payer: Medicare Other | Source: Ambulatory Visit | Attending: Gastroenterology | Admitting: Gastroenterology

## 2020-03-29 ENCOUNTER — Other Ambulatory Visit: Payer: Self-pay

## 2020-03-29 DIAGNOSIS — N281 Cyst of kidney, acquired: Secondary | ICD-10-CM | POA: Diagnosis not present

## 2020-03-29 DIAGNOSIS — K439 Ventral hernia without obstruction or gangrene: Secondary | ICD-10-CM | POA: Diagnosis not present

## 2020-03-29 DIAGNOSIS — K469 Unspecified abdominal hernia without obstruction or gangrene: Secondary | ICD-10-CM | POA: Diagnosis not present

## 2020-03-29 DIAGNOSIS — R1032 Left lower quadrant pain: Secondary | ICD-10-CM | POA: Insufficient documentation

## 2020-03-29 DIAGNOSIS — I700 Atherosclerosis of aorta: Secondary | ICD-10-CM | POA: Diagnosis not present

## 2020-03-29 LAB — POCT I-STAT CREATININE: Creatinine, Ser: 0.9 mg/dL (ref 0.44–1.00)

## 2020-03-29 MED ORDER — IOHEXOL 300 MG/ML  SOLN
100.0000 mL | Freq: Once | INTRAMUSCULAR | Status: AC | PRN
  Administered 2020-03-29: 85 mL via INTRAVENOUS

## 2020-04-25 ENCOUNTER — Other Ambulatory Visit: Payer: Self-pay

## 2020-04-25 ENCOUNTER — Ambulatory Visit: Payer: Medicare Other | Admitting: Internal Medicine

## 2020-04-25 ENCOUNTER — Encounter: Payer: Self-pay | Admitting: Internal Medicine

## 2020-04-25 DIAGNOSIS — M159 Polyosteoarthritis, unspecified: Secondary | ICD-10-CM

## 2020-04-25 DIAGNOSIS — I700 Atherosclerosis of aorta: Secondary | ICD-10-CM | POA: Diagnosis not present

## 2020-04-25 DIAGNOSIS — M8949 Other hypertrophic osteoarthropathy, multiple sites: Secondary | ICD-10-CM | POA: Diagnosis not present

## 2020-04-25 DIAGNOSIS — F5101 Primary insomnia: Secondary | ICD-10-CM | POA: Diagnosis not present

## 2020-04-25 DIAGNOSIS — I5032 Chronic diastolic (congestive) heart failure: Secondary | ICD-10-CM | POA: Diagnosis not present

## 2020-04-25 DIAGNOSIS — N3941 Urge incontinence: Secondary | ICD-10-CM | POA: Diagnosis not present

## 2020-04-25 DIAGNOSIS — K5909 Other constipation: Secondary | ICD-10-CM

## 2020-04-25 NOTE — Assessment & Plan Note (Signed)
Likely the cause of her gas pains Bowels more regular and pain is gone now Has non obstructive incarcerated hernia--no action

## 2020-04-25 NOTE — Assessment & Plan Note (Signed)
Still not sleeping great---but melatonin seems to help

## 2020-04-25 NOTE — Assessment & Plan Note (Signed)
Neutral fluid status Still has stable DOE No meds still

## 2020-04-25 NOTE — Progress Notes (Signed)
Subjective:    Patient ID: [name] [name], female    DOB: 1923-10-21, 86 y.o.   MRN: [id number]  HPI Visit in assisted living apartment for follow up of chronic health conditions Reviewed status with Luellen Pucker RN Reviewed recent GI evaluation  Recent GI evaluation--due to ongoing abdominal gas pains,etc CT showed small hernia---but not causing obstruction Also has aortic atherosclerosis noted Bowels are more regular now and her gas is better  Hasn't been walking outside as much Will still have DOE---but [place] a [place] Going [place] No chest pain No dizziness or syncope No edema Sleeps with 1 thick pillow---no PND  No heartburn on omeprazole No dysphagia  Ongoing terrible shoulder pains Gets relief with the celebrex  Didn't tolerate the lidocaine patches  Current Outpatient Medications on File Prior to Visit  Medication Sig Dispense Refill   acetaminophen (TYLENOL) 650 MG CR tablet Take 650 mg by mouth 3 (three) times daily.     aspirin EC 81 MG tablet Take 81 mg by mouth daily. Swallow whole.     celecoxib (CELEBREX) 200 MG capsule TAKE 1 CAPSULE DAILY 90 capsule 3   CVS VITAMIN D3 1000 UNITS capsule TAKE ONE CAPSULE BY MOUTH DAILY 30 capsule 0   dorzolamide-timolol (COSOPT) 22.3-6.8 MG/ML ophthalmic solution Apply to eye.     EPIPEN 2-PAK 0.3 MG/0.3ML SOAJ injection Inject into the muscle once.     fluticasone (FLONASE) 50 MCG/ACT nasal spray Place 1 spray into both nostrils 2 (two) times a day.     latanoprost (XALATAN) 0.005 % ophthalmic solution      levocetirizine (XYZAL) 5 MG tablet Take 5 mg by mouth every evening.     melatonin 3 MG TABS tablet Take 3 mg by mouth at bedtime.     Multiple Vitamin (MULTIVITAMIN) tablet Take 1 tablet by mouth daily.     MYRBETRIQ 25 MG TB24 tablet      omeprazole (PRILOSEC) 20 MG capsule Take 1 capsule (20 mg total) by mouth daily. 30 capsule 3   polyethylene glycol powder (GLYCOLAX/MIRALAX)  17 GM/SCOOP powder Take by mouth.     Probiotic Product (PROBIOTIC ADVANCED PO) Take 1 capsule by mouth daily.     Simethicone (GAS-X PO) Take by mouth 2 (two) times daily.     No current facility-administered medications on file prior to visit.    Allergies  Allergen Reactions   Demerol [Meperidine]     dizzy   Shellfish Allergy    Avelox [Moxifloxacin Hcl In Nacl] Rash    Past Medical History:  Diagnosis Date   Allergic rhinitis due to pollen    some shellfish also   Aortic sclerosis 5/12   On echo. no stenosis   Arthritis    Diastolic dysfunction    stress echo otherwise normal 1/04. Repeat 5/11 also negative   History of seasonal allergies    Hx of basal cell carcinoma 1990   multiple sites   Hx of squamous cell carcinoma of skin 2000   R nasolabial fold   Hyperlipidemia    Osteoarthritis, multiple sites    Osteoporosis    Overactive bladder    Urge incontinence    Vaginal odor 04/2018   aerobic vaginitis on One Swab culture    Past Surgical History:  Procedure Laterality Date   CYSTOCELE REPAIR  2007   and rectocele   DILATION AND CURETTAGE OF UTERUS     INGUINAL HERNIA REPAIR  2012   left side  with mesh   REVERSE SHOULDER ARTHROPLASTY Right 04/27/2017   Procedure: REVERSE SHOULDER ARTHROPLASTY;  Surgeon: Corky Mull, MD;  Location: [facility];  Service: Orthopedics;  Laterality: Right;   TONSILLECTOMY AND ADENOIDECTOMY     as child   TOTAL HIP ARTHROPLASTY Right 05/23/2015   Procedure: TOTAL HIP ARTHROPLASTY ANTERIOR APPROACH;  Surgeon: Hessie Knows, MD;  Location: [facility];  Service: Orthopedics;  Laterality: Right;   UMBILICAL HERNIA REPAIR  02/04/06    Family History  Problem Relation Age of Onset   Cancer Father 20       colon cancer   Heart disease Father     Social History   Socioeconomic History   Marital status: Widowed    Spouse name: Not on file   Number of children: 3   Years of education: Not on file    Highest education level: Not on file  Occupational History   Occupation: Pharmacist, hospital    Comment: short time   Occupation: Herbalist    Comment: most of career  Tobacco Use   Smoking status: Former Smoker   Smokeless tobacco: Never Used  Scientific laboratory technician Use: Never used  Substance and Sexual Activity   Alcohol use: Yes    Comment: wine before dinner   Drug use: No   Sexual activity: Not Currently    Birth control/protection: Post-menopausal  Other Topics Concern   Not on file  Social History Narrative   Widowed 2019   Has living will   Daughter [name], is health care POA   Would accept trial of resuscitation   Would probably accept feeding tube   Social Determinants of Health   Financial Resource Strain:    Difficulty of Paying Living Expenses: Not on file  Food Insecurity:    Worried About Charity fundraiser in the Last Year: Not on file   [employer] of Food in the Last Year: Not on file  Transportation Needs:    Lack of Transportation (Medical): Not on file   Lack of Transportation (Non-Medical): Not on file  Physical Activity:    Days of Exercise per Week: Not on file   Minutes of Exercise per Session: Not on file  Stress:    Feeling of Stress : Not on file  Social Connections:    Frequency of Communication with Friends and Family: Not on file   Frequency of Social Gatherings with Friends and Family: Not on file   Attends Religious Services: Not on file   Active Member of Clubs or Organizations: Not on file   Attends Archivist Meetings: Not on file   Marital Status: Not on file  Intimate Partner Violence:    Fear of Current or Ex-Partner: Not on file   Emotionally Abused: Not on file   Physically Abused: Not on file   Sexually Abused: Not on file   Review of Systems Sleep is intermittent--some daytime tiredness--but doesn't nap. Uses melatonin Appetite is good Weight  Voids okay No skin problems    Objective:    Physical Exam Constitutional:      Appearance: Normal appearance.  Cardiovascular:     Rate and Rhythm: Normal rate and regular rhythm.     Heart sounds: No murmur heard.  No gallop.   Pulmonary:     Breath sounds: Normal breath sounds. No wheezing or rales.  Abdominal:     Palpations: Abdomen is soft.     Tenderness: There is no abdominal tenderness.  Musculoskeletal:  Cervical back: Neck supple.     Right lower leg: No edema.     Left lower leg: No edema.  Lymphadenopathy:     Cervical: No cervical adenopathy.  Neurological:     General: No focal deficit present.     Mental Status: She is alert and oriented to person, place, and time.  Psychiatric:        Mood and Affect: Mood normal.        Behavior: Behavior normal.            Assessment & Plan:

## 2020-04-25 NOTE — Assessment & Plan Note (Signed)
Does well with the myrbetriq

## 2020-04-25 NOTE — Assessment & Plan Note (Signed)
Found on recent CT scan Given her age, I would not consider statin

## 2020-04-25 NOTE — Assessment & Plan Note (Signed)
Shoulders are especially bad The celebrex really helps---normal GFR and on PPI Discussed trying to hold doses if abdominal symptoms return---in case it could be related to this

## 2020-05-03 DIAGNOSIS — M7582 Other shoulder lesions, left shoulder: Secondary | ICD-10-CM | POA: Diagnosis not present

## 2020-05-03 DIAGNOSIS — M12812 Other specific arthropathies, not elsewhere classified, left shoulder: Secondary | ICD-10-CM | POA: Diagnosis not present

## 2020-05-03 DIAGNOSIS — M7581 Other shoulder lesions, right shoulder: Secondary | ICD-10-CM | POA: Diagnosis not present

## 2020-05-03 DIAGNOSIS — Z96611 Presence of right artificial shoulder joint: Secondary | ICD-10-CM | POA: Diagnosis not present

## 2020-05-03 DIAGNOSIS — M75122 Complete rotator cuff tear or rupture of left shoulder, not specified as traumatic: Secondary | ICD-10-CM | POA: Diagnosis not present

## 2020-05-12 ENCOUNTER — Other Ambulatory Visit: Payer: Self-pay | Admitting: Internal Medicine

## 2020-05-21 DIAGNOSIS — Z23 Encounter for immunization: Secondary | ICD-10-CM | POA: Diagnosis not present

## 2020-06-13 DIAGNOSIS — J301 Allergic rhinitis due to pollen: Secondary | ICD-10-CM | POA: Diagnosis not present

## 2020-06-13 DIAGNOSIS — J3089 Other allergic rhinitis: Secondary | ICD-10-CM | POA: Diagnosis not present

## 2020-06-13 DIAGNOSIS — J3081 Allergic rhinitis due to animal (cat) (dog) hair and dander: Secondary | ICD-10-CM | POA: Diagnosis not present

## 2020-06-28 DIAGNOSIS — Z96611 Presence of right artificial shoulder joint: Secondary | ICD-10-CM | POA: Diagnosis not present

## 2020-06-28 DIAGNOSIS — M25512 Pain in left shoulder: Secondary | ICD-10-CM | POA: Diagnosis not present

## 2020-06-28 DIAGNOSIS — M19012 Primary osteoarthritis, left shoulder: Secondary | ICD-10-CM | POA: Diagnosis not present

## 2020-06-28 DIAGNOSIS — M19011 Primary osteoarthritis, right shoulder: Secondary | ICD-10-CM | POA: Diagnosis not present

## 2020-06-28 DIAGNOSIS — M25511 Pain in right shoulder: Secondary | ICD-10-CM | POA: Diagnosis not present

## 2020-06-28 DIAGNOSIS — M6281 Muscle weakness (generalized): Secondary | ICD-10-CM | POA: Diagnosis not present

## 2020-07-02 ENCOUNTER — Telehealth: Payer: Self-pay | Admitting: Internal Medicine

## 2020-07-02 DIAGNOSIS — M19011 Primary osteoarthritis, right shoulder: Secondary | ICD-10-CM | POA: Diagnosis not present

## 2020-07-02 DIAGNOSIS — M6281 Muscle weakness (generalized): Secondary | ICD-10-CM | POA: Diagnosis not present

## 2020-07-02 DIAGNOSIS — Z96611 Presence of right artificial shoulder joint: Secondary | ICD-10-CM | POA: Diagnosis not present

## 2020-07-02 DIAGNOSIS — M25511 Pain in right shoulder: Secondary | ICD-10-CM | POA: Diagnosis not present

## 2020-07-02 DIAGNOSIS — M19012 Primary osteoarthritis, left shoulder: Secondary | ICD-10-CM | POA: Diagnosis not present

## 2020-07-02 DIAGNOSIS — M25512 Pain in left shoulder: Secondary | ICD-10-CM | POA: Diagnosis not present

## 2020-07-02 NOTE — Telephone Encounter (Signed)
Notified by Luellen Pucker RN about ongoing concerns with shoulder pain Keeping her up at night Okay to try tramadol 25mg  at bedtime and increase to 50mg  if not effective in first few nights.

## 2020-07-04 DIAGNOSIS — Z741 Need for assistance with personal care: Secondary | ICD-10-CM | POA: Diagnosis not present

## 2020-07-04 DIAGNOSIS — I5030 Unspecified diastolic (congestive) heart failure: Secondary | ICD-10-CM | POA: Diagnosis not present

## 2020-07-04 DIAGNOSIS — M19012 Primary osteoarthritis, left shoulder: Secondary | ICD-10-CM | POA: Diagnosis not present

## 2020-07-04 DIAGNOSIS — R262 Difficulty in walking, not elsewhere classified: Secondary | ICD-10-CM | POA: Diagnosis not present

## 2020-07-04 DIAGNOSIS — R278 Other lack of coordination: Secondary | ICD-10-CM | POA: Diagnosis not present

## 2020-07-04 DIAGNOSIS — R296 Repeated falls: Secondary | ICD-10-CM | POA: Diagnosis not present

## 2020-07-04 DIAGNOSIS — R2681 Unsteadiness on feet: Secondary | ICD-10-CM | POA: Diagnosis not present

## 2020-07-04 DIAGNOSIS — R4189 Other symptoms and signs involving cognitive functions and awareness: Secondary | ICD-10-CM | POA: Diagnosis not present

## 2020-07-05 DIAGNOSIS — M19012 Primary osteoarthritis, left shoulder: Secondary | ICD-10-CM | POA: Diagnosis not present

## 2020-07-05 DIAGNOSIS — R278 Other lack of coordination: Secondary | ICD-10-CM | POA: Diagnosis not present

## 2020-07-05 DIAGNOSIS — R262 Difficulty in walking, not elsewhere classified: Secondary | ICD-10-CM | POA: Diagnosis not present

## 2020-07-05 DIAGNOSIS — R2681 Unsteadiness on feet: Secondary | ICD-10-CM | POA: Diagnosis not present

## 2020-07-05 DIAGNOSIS — I5030 Unspecified diastolic (congestive) heart failure: Secondary | ICD-10-CM | POA: Diagnosis not present

## 2020-07-05 DIAGNOSIS — R296 Repeated falls: Secondary | ICD-10-CM | POA: Diagnosis not present

## 2020-07-08 DIAGNOSIS — M19012 Primary osteoarthritis, left shoulder: Secondary | ICD-10-CM | POA: Diagnosis not present

## 2020-07-08 DIAGNOSIS — I5030 Unspecified diastolic (congestive) heart failure: Secondary | ICD-10-CM | POA: Diagnosis not present

## 2020-07-08 DIAGNOSIS — R2681 Unsteadiness on feet: Secondary | ICD-10-CM | POA: Diagnosis not present

## 2020-07-08 DIAGNOSIS — R278 Other lack of coordination: Secondary | ICD-10-CM | POA: Diagnosis not present

## 2020-07-08 DIAGNOSIS — R262 Difficulty in walking, not elsewhere classified: Secondary | ICD-10-CM | POA: Diagnosis not present

## 2020-07-08 DIAGNOSIS — R296 Repeated falls: Secondary | ICD-10-CM | POA: Diagnosis not present

## 2020-07-11 DIAGNOSIS — I5030 Unspecified diastolic (congestive) heart failure: Secondary | ICD-10-CM | POA: Diagnosis not present

## 2020-07-11 DIAGNOSIS — R278 Other lack of coordination: Secondary | ICD-10-CM | POA: Diagnosis not present

## 2020-07-11 DIAGNOSIS — R2681 Unsteadiness on feet: Secondary | ICD-10-CM | POA: Diagnosis not present

## 2020-07-11 DIAGNOSIS — R262 Difficulty in walking, not elsewhere classified: Secondary | ICD-10-CM | POA: Diagnosis not present

## 2020-07-11 DIAGNOSIS — R296 Repeated falls: Secondary | ICD-10-CM | POA: Diagnosis not present

## 2020-07-11 DIAGNOSIS — M19012 Primary osteoarthritis, left shoulder: Secondary | ICD-10-CM | POA: Diagnosis not present

## 2020-07-12 ENCOUNTER — Other Ambulatory Visit: Payer: Self-pay

## 2020-07-12 ENCOUNTER — Ambulatory Visit: Payer: Medicare Other | Admitting: Internal Medicine

## 2020-07-12 VITALS — BP 151/71 | HR 78 | Temp 97.7°F | Resp 18 | Wt 155.8 lb

## 2020-07-12 DIAGNOSIS — I5030 Unspecified diastolic (congestive) heart failure: Secondary | ICD-10-CM | POA: Diagnosis not present

## 2020-07-12 DIAGNOSIS — L03032 Cellulitis of left toe: Secondary | ICD-10-CM | POA: Diagnosis not present

## 2020-07-12 DIAGNOSIS — M19012 Primary osteoarthritis, left shoulder: Secondary | ICD-10-CM | POA: Diagnosis not present

## 2020-07-12 DIAGNOSIS — R296 Repeated falls: Secondary | ICD-10-CM | POA: Diagnosis not present

## 2020-07-12 DIAGNOSIS — R2681 Unsteadiness on feet: Secondary | ICD-10-CM | POA: Diagnosis not present

## 2020-07-12 DIAGNOSIS — R278 Other lack of coordination: Secondary | ICD-10-CM | POA: Diagnosis not present

## 2020-07-12 DIAGNOSIS — R262 Difficulty in walking, not elsewhere classified: Secondary | ICD-10-CM | POA: Diagnosis not present

## 2020-07-15 DIAGNOSIS — I5030 Unspecified diastolic (congestive) heart failure: Secondary | ICD-10-CM | POA: Diagnosis not present

## 2020-07-15 DIAGNOSIS — R262 Difficulty in walking, not elsewhere classified: Secondary | ICD-10-CM | POA: Diagnosis not present

## 2020-07-15 DIAGNOSIS — R2681 Unsteadiness on feet: Secondary | ICD-10-CM | POA: Diagnosis not present

## 2020-07-15 DIAGNOSIS — M19012 Primary osteoarthritis, left shoulder: Secondary | ICD-10-CM | POA: Diagnosis not present

## 2020-07-15 DIAGNOSIS — R296 Repeated falls: Secondary | ICD-10-CM | POA: Diagnosis not present

## 2020-07-15 DIAGNOSIS — R278 Other lack of coordination: Secondary | ICD-10-CM | POA: Diagnosis not present

## 2020-07-25 ENCOUNTER — Encounter: Payer: Self-pay | Admitting: Internal Medicine

## 2020-07-25 NOTE — Patient Instructions (Signed)
Paronychia Paronychia is an infection of the skin. It happens near a fingernail or toenail. It may cause pain and swelling around the nail. In some cases, a fluid-filled bump (abscess) can form near or under the nail. Usually, this condition is not serious, and it clears up with treatment. Follow these instructions at home: Wound care  Keep the affected area clean.  Soak the fingers or toes in warm water as told by your doctor. You may be told to do this for 20 minutes, 2-3 times a day.  Keep the area dry when you are not soaking it.  Do not try to drain a fluid-filled bump on your own.  Follow instructions from your doctor about how to take care of the affected area. Make sure you: ? Wash your hands with soap and water before you change your bandage (dressing). If you cannot use soap and water, use hand sanitizer. ? Change your bandage as told by your doctor.  If you had a fluid-filled bump and your doctor drained it, check the area every day for signs of infection. Check for: ? Redness, swelling, or pain. ? Fluid or blood. ? Warmth. ? Pus or a bad smell. Medicines   Take over-the-counter and prescription medicines only as told by your doctor.  If you were prescribed an antibiotic medicine, take it as told by your doctor. Do not stop taking it even if you start to feel better. General instructions  Avoid touching any chemicals.  Do not pick at the affected area. Prevention  To prevent this condition from happening again: ? Wear rubber gloves when putting your hands in water for washing dishes or other tasks. ? Wear gloves if your hands might touch cleaners or chemicals. ? Avoid injuring your nails or fingertips. ? Do not bite your nails or tear hangnails. ? Do not cut your nails very short. ? Do not cut the skin at the base and sides of the nail (cuticles). ? Use clean nail clippers or scissors when trimming nails. Contact a doctor if:  You feel worse.  You do not get  better.  You have more fluid, blood, or pus coming from the affected area.  Your finger or knuckle is swollen or is hard to move. Get help right away if you have:  A fever or chills.  Redness spreading from the affected area.  Pain in a joint or muscle. Summary  Paronychia is an infection of the skin. It happens near a fingernail or toenail.  This condition may cause pain and swelling around the nail.  Soak the fingers or toes in warm water as told by your doctor.  Usually, this condition is not serious, and it clears up with treatment. This information is not intended to replace advice given to you by your health care provider. Make sure you discuss any questions you have with your health care provider. Document Revised: 08/06/2017 Document Reviewed: 08/02/2017 Elsevier Patient Education  2020 Elsevier Inc.  

## 2020-07-25 NOTE — Progress Notes (Signed)
Subjective:    Patient ID: [name] [name], female    DOB: [birth year], 86 y.o.   MRN: [id number]  HPI  Asked to check resident in [place] left great toe pain, redness, with bleeding and drainage No fevers No known injury  Review of Systems      Past Medical History:  Diagnosis Date  . Allergic rhinitis due to pollen    some shellfish also  . Aortic sclerosis 5/12   On echo. no stenosis  . Arthritis   . Diastolic dysfunction    stress echo otherwise normal 1/04. Repeat 5/11 also negative  . History of seasonal allergies   . Hx of basal cell carcinoma 1990   multiple sites  . Hx of squamous cell carcinoma of skin 2000   R nasolabial fold  . Hyperlipidemia   . Osteoarthritis, multiple sites   . Osteoporosis   . Overactive bladder   . Urge incontinence   . Vaginal odor 04/2018   aerobic vaginitis on One Swab culture    Current Outpatient Medications  Medication Sig Dispense Refill  . acetaminophen (TYLENOL) 650 MG CR tablet Take 650 mg by mouth 3 (three) times daily.    Marland Kitchen aspirin EC 81 MG tablet Take 81 mg by mouth daily. Swallow whole.    . celecoxib (CELEBREX) 200 MG capsule TAKE 1 CAPSULE DAILY 90 capsule 3  . CVS VITAMIN D3 1000 UNITS capsule TAKE ONE CAPSULE BY MOUTH DAILY 30 capsule 0  . dorzolamide-timolol (COSOPT) 22.3-6.8 MG/ML ophthalmic solution Apply to eye.    Marland Kitchen EPIPEN 2-PAK 0.3 MG/0.3ML SOAJ injection Inject into the muscle once.    . fluticasone (FLONASE) 50 MCG/ACT nasal spray Place 1 spray into both nostrils 2 (two) times a day.    . latanoprost (XALATAN) 0.005 % ophthalmic solution     . levocetirizine (XYZAL) 5 MG tablet Take 5 mg by mouth every evening.    . melatonin 3 MG TABS tablet Take 3 mg by mouth at bedtime.    . Multiple Vitamin (MULTIVITAMIN) tablet Take 1 tablet by mouth daily.    Marland Kitchen MYRBETRIQ 25 MG TB24 tablet     . omeprazole (PRILOSEC) 20 MG capsule Take 1 capsule (20 mg total) by mouth daily. 30 capsule 3  . polyethylene glycol  powder (GLYCOLAX/MIRALAX) 17 GM/SCOOP powder Take by mouth.    . Probiotic Product (PROBIOTIC ADVANCED PO) Take 1 capsule by mouth daily.    . Simethicone (GAS-X PO) Take by mouth 2 (two) times daily.     No current facility-administered medications for this visit.    Allergies  Allergen Reactions  . Demerol [Meperidine]     dizzy  . Shellfish Allergy   . Avelox [Moxifloxacin Hcl In Nacl] Rash    Family History  Problem Relation Age of Onset  . Cancer Father 9       colon cancer  . Heart disease Father     Social History   Socioeconomic History  . Marital status: Widowed    Spouse name: Not on file  . Number of children: 3  . Years of education: Not on file  . Highest education level: Not on file  Occupational History  . Occupation: Pharmacist, hospital    Comment: short time  . Occupation: Herbalist    Comment: most of career  Tobacco Use  . Smoking status: Former Research scientist (life sciences)  . Smokeless tobacco: Never Used  Vaping Use  . Vaping Use: Never used  Substance and Sexual Activity  .  Alcohol use: Yes    Comment: wine before dinner  . Drug use: No  . Sexual activity: Not Currently    Birth control/protection: Post-menopausal  Other Topics Concern  . Not on file  Social History Narrative   Widowed 2019   Has living will   Daughter [name], is health care POA   Would accept trial of resuscitation   Would probably accept feeding tube   Social Determinants of Health   Financial Resource Strain: Not on file  Food Insecurity: Not on file  Transportation Needs: Not on file  Physical Activity: Not on file  Stress: Not on file  Social Connections: Not on file  Intimate Partner Violence: Not on file     Constitutional: Denies fever, malaise, fatigue, headache or abrupt weight changes.  Skin: Pt reports left great toe pain, redness, and drainage. Denies  rashes, lesions or ulcercations.    No other specific complaints in a complete review of systems (except as listed in HPI  above).  Objective:   Physical Exam  There were no vitals taken for this visit. Wt Readings from Last 3 Encounters:  01/27/20 159 lb 6.4 oz (72.3 kg)  10/26/19 158 lb (71.7 kg)  10/24/19 160 lb (72.6 kg)    General: Appears her stated age, well developed, well nourished in NAD. Skin: Paronychia noted of lateral edge of left great toenail with surrounding redness and swelling of the left great toe. Cardiovascular: Normal rate. Pulmonary/Chest: Normal effort. Neurological: Alert and oriented.   BMET    Component Value Date/Time   NA 133 (L) 03/07/2018 1604   K 4.7 03/07/2018 1604   CL 99 03/07/2018 1604   CO2 29 03/07/2018 1604   GLUCOSE 102 (H) 03/07/2018 1604   BUN 18 03/07/2018 1604   CREATININE 0.90 03/29/2020 1114   CALCIUM 9.5 03/07/2018 1604   GFRNONAA >60 04/28/2017 0412   GFRAA >60 04/28/2017 0412    Lipid Panel     Component Value Date/Time   CHOL 232 (H) 02/06/2013 1108   TRIG 72.0 02/06/2013 1108   HDL 77.50 02/06/2013 1108   CHOLHDL 3 02/06/2013 1108   VLDL 14.4 02/06/2013 1108    CBC    Component Value Date/Time   WBC 8.4 03/07/2018 1604   RBC 4.07 03/07/2018 1604   HGB 13.0 03/07/2018 1604   HCT 38.6 03/07/2018 1604   PLT 288.0 03/07/2018 1604   MCV 94.9 03/07/2018 1604   MCH 31.4 04/28/2017 0412   MCHC 33.6 03/07/2018 1604   RDW 14.0 03/07/2018 1604   LYMPHSABS 4.2 (H) 04/28/2017 0412   MONOABS 1.1 (H) 04/28/2017 0412   EOSABS 0.0 04/28/2017 0412   BASOSABS 0.0 04/28/2017 0412    Hgb A1C No results found for: HGBA1C          Assessment & Plan:   Paronychia of Left Great Toe:  RX for Keflex 500 mg TID x 7 days Epsom salt soaks daily  RN to notify me if I need to reassess

## 2020-07-31 ENCOUNTER — Other Ambulatory Visit: Payer: Self-pay

## 2020-07-31 ENCOUNTER — Ambulatory Visit: Payer: Medicare Other | Admitting: Internal Medicine

## 2020-07-31 DIAGNOSIS — K219 Gastro-esophageal reflux disease without esophagitis: Secondary | ICD-10-CM

## 2020-07-31 DIAGNOSIS — F5101 Primary insomnia: Secondary | ICD-10-CM

## 2020-07-31 DIAGNOSIS — M8949 Other hypertrophic osteoarthropathy, multiple sites: Secondary | ICD-10-CM

## 2020-07-31 DIAGNOSIS — I5032 Chronic diastolic (congestive) heart failure: Secondary | ICD-10-CM

## 2020-07-31 DIAGNOSIS — N3941 Urge incontinence: Secondary | ICD-10-CM | POA: Diagnosis not present

## 2020-07-31 DIAGNOSIS — M159 Polyosteoarthritis, unspecified: Secondary | ICD-10-CM

## 2020-08-01 ENCOUNTER — Encounter: Payer: Self-pay | Admitting: Internal Medicine

## 2020-08-01 NOTE — Assessment & Plan Note (Signed)
Continue Melatonin, Celebrex, Voltaren Gel and Tramadol (insomnia pain related)

## 2020-08-01 NOTE — Assessment & Plan Note (Signed)
Continue Omeprazole ?

## 2020-08-01 NOTE — Assessment & Plan Note (Signed)
Euvolemic on exam Not medicated Monitor weights

## 2020-08-01 NOTE — Progress Notes (Signed)
Subjective:    Patient ID: [name] [name], female    DOB: 05/06/1924, 86 y.o.   MRN: [id number]  HPI  Resident seen in [address] for routine followup Reviewed with RN, no new concerns Paronychia of great toe resolved Resident does reports persistent left shoulder pain, has upcoming appt with Dr. Joice Lofts- possible injection, may consider surgery per resident Otherwise, she is doing well. Sleep varies due to her shoulder pain. Her appetite is good, weight is stable. She walks with a rolling walker. She has loose bowels, but this is chronic and has been manageable lately. She does have some urinary incontinence. Her mood is good. She denies chest pain, reflux of SOB.  OA: Managed on Celebrex, Voltaren Gel and Tramadol.  Mixed Incontinence: Managed on Myrbetriq.  GERD: She denies breakthrough on Omeprazole.   CHF: Compensated, off meds.  Insomnia: She is taking Melatonin. Sleep issues mostly related to pain.  Review of Systems      Past Medical History:  Diagnosis Date  . Allergic rhinitis due to pollen    some shellfish also  . Aortic sclerosis 5/12   On echo. no stenosis  . Arthritis   . Diastolic dysfunction    stress echo otherwise normal 1/04. Repeat 5/11 also negative  . History of seasonal allergies   . Hx of basal cell carcinoma 1990   multiple sites  . Hx of squamous cell carcinoma of skin 2000   R nasolabial fold  . Hyperlipidemia   . Osteoarthritis, multiple sites   . Osteoporosis   . Overactive bladder   . Urge incontinence   . Vaginal odor 04/2018   aerobic vaginitis on One Swab culture    Current Outpatient Medications  Medication Sig Dispense Refill  . acetaminophen (TYLENOL) 650 MG CR tablet Take 650 mg by mouth 3 (three) times daily.    Marland Kitchen aspirin EC 81 MG tablet Take 81 mg by mouth daily. Swallow whole.    . celecoxib (CELEBREX) 200 MG capsule TAKE 1 CAPSULE DAILY 90 capsule 3  . [employer] VITAMIN D3 1000 UNITS capsule TAKE ONE CAPSULE BY MOUTH DAILY 30  capsule 0  . dorzolamide-timolol (COSOPT) 22.3-6.8 MG/ML ophthalmic solution Apply to eye.    Marland Kitchen EPIPEN 2-PAK 0.3 MG/0.3ML SOAJ injection Inject into the muscle once.    . fluticasone (FLONASE) 50 MCG/ACT nasal spray Place 1 spray into both nostrils 2 (two) times a day.    . latanoprost (XALATAN) 0.005 % ophthalmic solution     . levocetirizine (XYZAL) 5 MG tablet Take 5 mg by mouth every evening.    . melatonin 3 MG TABS tablet Take 3 mg by mouth at bedtime.    . Multiple Vitamin (MULTIVITAMIN) tablet Take 1 tablet by mouth daily.    Marland Kitchen MYRBETRIQ 25 MG TB24 tablet     . omeprazole (PRILOSEC) 20 MG capsule Take 1 capsule (20 mg total) by mouth daily. 30 capsule 3  . polyethylene glycol powder (GLYCOLAX/MIRALAX) 17 GM/SCOOP powder Take by mouth.    . Probiotic Product (PROBIOTIC ADVANCED PO) Take 1 capsule by mouth daily.    . Simethicone (GAS-X PO) Take by mouth 2 (two) times daily.     No current facility-administered medications for this visit.    Allergies  Allergen Reactions  . Demerol [Meperidine]     dizzy  . Shellfish Allergy   . Avelox [Moxifloxacin Hcl In Nacl] Rash    Family History  Problem Relation Age of Onset  . Cancer Father 1  colon cancer  . Heart disease Father     Social History   Socioeconomic History  . Marital status: Widowed    Spouse name: Not on file  . Number of children: 3  . Years of education: Not on file  . Highest education level: Not on file  Occupational History  . Occupation: Pharmacist, hospital    Comment: short time  . Occupation: Herbalist    Comment: most of career  Tobacco Use  . Smoking status: Former Research scientist (life sciences)  . Smokeless tobacco: Never Used  Vaping Use  . Vaping Use: Never used  Substance and Sexual Activity  . Alcohol use: Yes    Comment: wine before dinner  . Drug use: No  . Sexual activity: Not Currently    Birth control/protection: Post-menopausal  Other Topics Concern  . Not on file  Social History Narrative    Widowed 2019   Has living will   Daughter [name], is health care POA   Would accept trial of resuscitation   Would probably accept feeding tube   Social Determinants of Health   Financial Resource Strain: Not on file  Food Insecurity: Not on file  Transportation Needs: Not on file  Physical Activity: Not on file  Stress: Not on file  Social Connections: Not on file  Intimate Partner Violence: Not on file     Constitutional: Denies fever, malaise, fatigue, headache or abrupt weight changes.  HEENT: Denies eye pain, eye redness, ear pain, ringing in the ears, wax buildup, runny nose, nasal congestion, bloody nose, or sore throat. Respiratory: Denies difficulty breathing, shortness of breath, cough or sputum production.   Cardiovascular: Denies chest pain, chest tightness, palpitations or swelling in the hands or feet.  Gastrointestinal: Pt reports loose stools. Denies abdominal pain, bloating, constipation, or blood in the stool.  GU: Pt reports mixed incontinence. Denies pain with urination, burning sensation, blood in urine, odor or discharge. Musculoskeletal: Pt reports chronic left shoulder pain. Denies decrease in range of motion, difficulty with gait, muscle pain or joint swelling.  Skin: Denies redness, rashes, lesions or ulcercations.  Neurological: Denies dizziness, difficulty with memory, difficulty with speech or problems with balance and coordination.  Psych: Denies anxiety, depression, SI/HI.  No other specific complaints in a complete review of systems (except as listed in HPI above).  Objective:   Physical Exam   BP (!) 146/66   Pulse 64   Temp 97.9 F (36.6 C)   Resp 18   Wt 155 lb 9.6 oz (70.6 kg)   BMI 25.89 kg/m  Wt Readings from Last 3 Encounters:  08/01/20 155 lb 9.6 oz (70.6 kg)  07/25/20 155 lb 12.8 oz (70.7 kg)  01/27/20 159 lb 6.4 oz (72.3 kg)    General: Appears her stated age, well developed, well nourished in NAD. Skin: Warm, dry and intact.  No ulcerations noted. Neck:  No adenopathy noted. Cardiovascular: Normal rate and rhythm. S1,S2 noted.  No murmur, rubs or gallops noted. No JVD or BLE edema.  Pulmonary/Chest: Normal effort and positive vesicular breath sounds. No respiratory distress. No wheezes, rales or ronchi noted.  Abdomen: Soft and nontender. Normal bowel sounds.  Neurological: Alert and oriented.  Psychiatric: Mood and affect normal.   BMET    Component Value Date/Time   NA 133 (L) 03/07/2018 1604   K 4.7 03/07/2018 1604   CL 99 03/07/2018 1604   CO2 29 03/07/2018 1604   GLUCOSE 102 (H) 03/07/2018 1604   BUN 18 03/07/2018 1604  CREATININE 0.90 03/29/2020 1114   CALCIUM 9.5 03/07/2018 1604   GFRNONAA >60 04/28/2017 0412   GFRAA >60 04/28/2017 0412    Lipid Panel     Component Value Date/Time   CHOL 232 (H) 02/06/2013 1108   TRIG 72.0 02/06/2013 1108   HDL 77.50 02/06/2013 1108   CHOLHDL 3 02/06/2013 1108   VLDL 14.4 02/06/2013 1108    CBC    Component Value Date/Time   WBC 8.4 03/07/2018 1604   RBC 4.07 03/07/2018 1604   HGB 13.0 03/07/2018 1604   HCT 38.6 03/07/2018 1604   PLT 288.0 03/07/2018 1604   MCV 94.9 03/07/2018 1604   MCH 31.4 04/28/2017 0412   MCHC 33.6 03/07/2018 1604   RDW 14.0 03/07/2018 1604   LYMPHSABS 4.2 (H) 04/28/2017 0412   MONOABS 1.1 (H) 04/28/2017 0412   EOSABS 0.0 04/28/2017 0412   BASOSABS 0.0 04/28/2017 0412    Hgb A1C No results found for: HGBA1C         Assessment & Plan:

## 2020-08-01 NOTE — Assessment & Plan Note (Signed)
Continue Myrbetriq 

## 2020-08-01 NOTE — Assessment & Plan Note (Signed)
Continue Celebrex, Voltaren Gel and Tramadol She has an upcoming appt with Dr. Joice Lofts

## 2020-08-09 DIAGNOSIS — M75122 Complete rotator cuff tear or rupture of left shoulder, not specified as traumatic: Secondary | ICD-10-CM | POA: Diagnosis not present

## 2020-08-09 DIAGNOSIS — Z96611 Presence of right artificial shoulder joint: Secondary | ICD-10-CM | POA: Diagnosis not present

## 2020-08-09 DIAGNOSIS — M7582 Other shoulder lesions, left shoulder: Secondary | ICD-10-CM | POA: Diagnosis not present

## 2020-08-09 DIAGNOSIS — M25511 Pain in right shoulder: Secondary | ICD-10-CM | POA: Diagnosis not present

## 2020-08-09 DIAGNOSIS — M12812 Other specific arthropathies, not elsewhere classified, left shoulder: Secondary | ICD-10-CM | POA: Diagnosis not present

## 2020-08-09 DIAGNOSIS — M7581 Other shoulder lesions, right shoulder: Secondary | ICD-10-CM | POA: Diagnosis not present

## 2020-08-12 ENCOUNTER — Other Ambulatory Visit: Payer: Self-pay | Admitting: Internal Medicine

## 2020-08-29 DIAGNOSIS — R419 Unspecified symptoms and signs involving cognitive functions and awareness: Secondary | ICD-10-CM | POA: Diagnosis not present

## 2020-10-24 ENCOUNTER — Other Ambulatory Visit: Payer: Self-pay

## 2020-10-24 ENCOUNTER — Ambulatory Visit: Payer: Medicare Other | Admitting: Internal Medicine

## 2020-10-24 ENCOUNTER — Encounter: Payer: Self-pay | Admitting: Internal Medicine

## 2020-10-24 VITALS — BP 139/76 | HR 82 | Resp 18 | Wt 152.0 lb

## 2020-10-24 DIAGNOSIS — K5909 Other constipation: Secondary | ICD-10-CM

## 2020-10-24 DIAGNOSIS — I5032 Chronic diastolic (congestive) heart failure: Secondary | ICD-10-CM

## 2020-10-24 DIAGNOSIS — R5383 Other fatigue: Secondary | ICD-10-CM | POA: Diagnosis not present

## 2020-10-24 DIAGNOSIS — M8949 Other hypertrophic osteoarthropathy, multiple sites: Secondary | ICD-10-CM | POA: Diagnosis not present

## 2020-10-24 DIAGNOSIS — M159 Polyosteoarthritis, unspecified: Secondary | ICD-10-CM

## 2020-10-24 DIAGNOSIS — I700 Atherosclerosis of aorta: Secondary | ICD-10-CM

## 2020-10-24 DIAGNOSIS — N3941 Urge incontinence: Secondary | ICD-10-CM

## 2020-10-24 NOTE — Assessment & Plan Note (Signed)
Compensated without meds ASA daily

## 2020-10-24 NOTE — Progress Notes (Signed)
Subjective:    Patient ID: [name] [name], female    DOB: 1924/07/31, 87 y.o.   MRN: [id number]  HPI Visit in assisted living apartment for review of chronic health conditions Reviewed status with Luellen Pucker RN  "I feel tired all the time" Will sit down and just fall sleep in chair No problems with staying awake when engaged Tries to walk daily---[place] (and does the exercise classes)  No chest pain No SOB---but stable DOE if walking up a hill No edema No palpitations  Bowels are moving more regularly Gets some gas and loose stools at times Stopped the miralax  Still on myrbetriq Hard to tell if it is helping Mostly urge incontinence firs thing in AM (getting out of bed) Wears liners for protection  Still has shoulder pain Going back to Dr Poggi---injections didn't help On celebrex and uses tylenol as well occ wakes with shoulder pain  Known aortic atherosclerosis seen on CT scan  Current Outpatient Medications on File Prior to Visit  Medication Sig Dispense Refill  . acetaminophen (TYLENOL) 650 MG CR tablet Take 650 mg by mouth 3 (three) times daily.    Marland Kitchen aspirin EC 81 MG tablet Take 81 mg by mouth daily. Swallow whole.    . celecoxib (CELEBREX) 200 MG capsule TAKE 1 CAPSULE DAILY 90 capsule 3  . dorzolamide-timolol (COSOPT) 22.3-6.8 MG/ML ophthalmic solution Apply to eye.    Marland Kitchen EPIPEN 2-PAK 0.3 MG/0.3ML SOAJ injection Inject into the muscle once.    . fluticasone (FLONASE) 50 MCG/ACT nasal spray Place 1 spray into both nostrils 2 (two) times a day.    . latanoprost (XALATAN) 0.005 % ophthalmic solution     . levocetirizine (XYZAL) 5 MG tablet Take 5 mg by mouth every evening.    . melatonin 3 MG TABS tablet Take 3 mg by mouth at bedtime.    . Multiple Vitamin (MULTIVITAMIN) tablet Take 1 tablet by mouth daily.    Marland Kitchen MYRBETRIQ 25 MG TB24 tablet     . omeprazole (PRILOSEC) 20 MG capsule Take 1 capsule (20 mg total) by mouth daily. 30 capsule 3  .  Probiotic Product (PROBIOTIC ADVANCED PO) Take 1 capsule by mouth daily.    . Simethicone (GAS-X PO) Take by mouth 2 (two) times daily.     No current facility-administered medications on file prior to visit.    Allergies  Allergen Reactions  . Demerol [Meperidine]     dizzy  . Shellfish Allergy   . Avelox [Moxifloxacin Hcl In Nacl] Rash    Past Medical History:  Diagnosis Date  . Allergic rhinitis due to pollen    some shellfish also  . Aortic sclerosis 5/12   On echo. no stenosis  . Arthritis   . Diastolic dysfunction    stress echo otherwise normal 1/04. Repeat 5/11 also negative  . History of seasonal allergies   . Hx of basal cell carcinoma 1990   multiple sites  . Hx of squamous cell carcinoma of skin 2000   R nasolabial fold  . Hyperlipidemia   . Osteoarthritis, multiple sites   . Osteoporosis   . Overactive bladder   . Urge incontinence   . Vaginal odor 04/2018   aerobic vaginitis on One Swab culture    Past Surgical History:  Procedure Laterality Date  . CYSTOCELE REPAIR  2007   and rectocele  . DILATION AND CURETTAGE OF UTERUS    . INGUINAL HERNIA REPAIR  2012   left  side with mesh  . REVERSE SHOULDER ARTHROPLASTY Right 04/27/2017   Procedure: REVERSE SHOULDER ARTHROPLASTY;  Surgeon: Corky Mull, MD;  Location: [facility] OR[facility];  Service: Orthopedics;  Laterality: Right;  . TONSILLECTOMY AND ADENOIDECTOMY     as child  . TOTAL HIP ARTHROPLASTY Right 05/23/2015   Procedure: TOTAL HIP ARTHROPLASTY ANTERIOR APPROACH;  Surgeon: Hessie Knows, MD;  Location: [facility];  Service: Orthopedics;  Laterality: Right;  . UMBILICAL HERNIA REPAIR  02/04/06    Family History  Problem Relation Age of Onset  . Cancer Father 50       colon cancer  . Heart disease Father     Social History   Socioeconomic History  . Marital status: Widowed    Spouse name: Not on file  . Number of children: 3  . Years of education: Not on file  . Highest education level: Not on file   Occupational History  . Occupation: Pharmacist, hospital    Comment: short time  . Occupation: Herbalist    Comment: most of career  Tobacco Use  . Smoking status: Former Research scientist (life sciences)  . Smokeless tobacco: Never Used  Vaping Use  . Vaping Use: Never used  Substance and Sexual Activity  . Alcohol use: Yes    Comment: wine before dinner  . Drug use: No  . Sexual activity: Not Currently    Birth control/protection: Post-menopausal  Other Topics Concern  . Not on file  Social History Narrative   Widowed 2019   Has living will   Daughter [name], is health care POA   Would accept trial of resuscitation   Would probably accept feeding tube   Social Determinants of Health   Financial Resource Strain: Not on file  Food Insecurity: Not on file  Transportation Needs: Not on file  Physical Activity: Not on file  Stress: Not on file  Social Connections: Not on file  Intimate Partner Violence: Not on file   Review of Systems Sleeps fine at night Appetite is good Weight is stable    Objective:   Physical Exam Constitutional:      Appearance: Normal appearance.  Cardiovascular:     Rate and Rhythm: Normal rate and regular rhythm.     Heart sounds: No murmur heard. No gallop.   Abdominal:     Palpations: Abdomen is soft.     Tenderness: There is no abdominal tenderness.  Musculoskeletal:     Cervical back: Neck supple.     Right lower leg: No edema.     Left lower leg: No edema.  Lymphadenopathy:     Cervical: No cervical adenopathy.  Skin:    Findings: No rash.  Neurological:     General: No focal deficit present.     Mental Status: She is alert.  Psychiatric:        Mood and Affect: Mood normal.        Behavior: Behavior normal.            Assessment & Plan:

## 2020-10-24 NOTE — Assessment & Plan Note (Signed)
Doesn't sound pathologic Will check routine labs in May though

## 2020-10-24 NOTE — Assessment & Plan Note (Signed)
Mostly shoulders and back celebrex and tylenol help some

## 2020-10-24 NOTE — Assessment & Plan Note (Signed)
myrbetriq does seem to help this

## 2020-10-24 NOTE — Assessment & Plan Note (Signed)
Incidental finding on CT scan No Rx at her age and lack of vascular symptoms

## 2020-10-24 NOTE — Assessment & Plan Note (Signed)
Seems better Will stop the miralax---she generally wasn't taking it

## 2021-01-24 ENCOUNTER — Other Ambulatory Visit: Payer: Self-pay

## 2021-01-24 ENCOUNTER — Non-Acute Institutional Stay: Payer: Medicare Other | Admitting: Internal Medicine

## 2021-01-24 DIAGNOSIS — I5032 Chronic diastolic (congestive) heart failure: Secondary | ICD-10-CM

## 2021-01-24 DIAGNOSIS — I700 Atherosclerosis of aorta: Secondary | ICD-10-CM

## 2021-01-24 DIAGNOSIS — M810 Age-related osteoporosis without current pathological fracture: Secondary | ICD-10-CM

## 2021-01-24 DIAGNOSIS — K219 Gastro-esophageal reflux disease without esophagitis: Secondary | ICD-10-CM

## 2021-01-24 DIAGNOSIS — F5101 Primary insomnia: Secondary | ICD-10-CM

## 2021-01-24 DIAGNOSIS — M159 Polyosteoarthritis, unspecified: Secondary | ICD-10-CM

## 2021-01-24 DIAGNOSIS — N3941 Urge incontinence: Secondary | ICD-10-CM

## 2021-01-24 DIAGNOSIS — M8949 Other hypertrophic osteoarthropathy, multiple sites: Secondary | ICD-10-CM

## 2021-01-29 ENCOUNTER — Encounter: Payer: Self-pay | Admitting: Internal Medicine

## 2021-01-29 NOTE — Patient Instructions (Signed)
Hypersomnia Hypersomnia is a condition in which a person feels very tired during the day even though he or she gets plenty of sleep at night. A person with this condition may take naps during the day and may find it very difficult to wake up from sleep. Hypersomnia may affect a person's ability to think, concentrate,drive, or remember things. What are the causes? The cause of this condition may not be known. Possible causes include: Certain medicines. Sleep disorders, such as narcolepsy and sleep apnea. Injury to the head, brain, or spinal cord. Drug or alcohol use. Gastroesophageal reflux disease (GERD). Tumors. Certain medical conditions, such as depression, diabetes, or an underactive thyroid gland (hypothyroidism). What are the signs or symptoms? The main symptoms of hypersomnia include: Feeling very tired throughout the day, regardless of how much sleep you got the night before. Having trouble waking up. Others may find it difficult to wake you up when you are sleeping. Sleeping for longer and longer periods at a time. Taking naps throughout the day. Other symptoms may include: Feeling restless, anxious, or annoyed. Lacking energy. Having trouble with: Remembering. Speaking. Thinking. Loss of appetite. Seeing, hearing, tasting, smelling, or feeling things that are not real (hallucinations). How is this diagnosed? This condition may be diagnosed based on: Your symptoms and medical history. Your sleeping habits. Your health care provider may ask you to write down your sleeping habits in a daily sleep log, along with any symptoms you have. A series of tests that are done while you sleep (sleep study or polysomnogram). A test that measures how quickly you can fall asleep during the day (daytime nap study or multiple sleep latency test). How is this treated? Treatment can help you manage your condition. Treatment may include: Following a regular sleep routine. Lifestyle changes,  such as changing your eating habits, getting regular exercise, and avoiding alcohol or caffeinated beverages. Taking medicines to make you more alert (stimulants) during the day. Treating any underlying medical causes of hypersomnia. Follow these instructions at home: Sleep routine  Schedule the same bedtime and wake-up time each day. Practice a relaxing bedtime routine. This may include reading, meditation, deep breathing, or taking a warm bath before going to sleep. Get regular exercise each day. Avoid strenuous exercise in the evening hours. Keep your sleep environment at a cooler temperature, darkened, and quiet. Sleep with pillows and a mattress that are comfortable and supportive. Schedule short 20-minute naps for when you feel sleepiest during the day. Talk with your employer or teachers about your hypersomnia. If possible, adjust your schedule so that: You have a regular daytime work schedule. You can take a scheduled nap during the day. You do not have to work or be active at night. Do not eat a heavy meal for a few hours before bedtime. Eat your meals at about the same times every day. Avoid drinking alcohol or caffeinated beverages.  Safety  Do not drive or use heavy machinery if you are sleepy. Ask your health care provider if it is safe for you to drive. Wear a life jacket when swimming or spending time near water.  General instructions Take supplements and over-the-counter and prescription medicines only as told by your health care provider. Keep a sleep log that will help your doctor manage your condition. This may include information about: What time you go to bed each night. How often you wake up at night. How many hours you sleep at night. How often and for how long you nap during the   day. Any observations from others, such as leg movements during sleep, sleep walking, or snoring. Keep all follow-up visits as told by your health care provider. This is  important. Contact a health care provider if: You have new symptoms. Your symptoms get worse. Get help right away if: You have serious thoughts about hurting yourself or someone else. If you ever feel like you may hurt yourself or others, or have thoughts about taking your own life, get help right away. You can go to your nearest emergency department or call: Your local emergency services (911 in the [place] A suicide crisis helpline, such as the National Suicide Prevention Lifeline at 1-800-273-8255. This is open 24 hours a day. Summary Hypersomnia refers to a condition in which you feel very tired during the day even though you get plenty of sleep at night. A person with this condition may take naps during the day and may find it very difficult to wake up from sleep. Hypersomnia may affect a person's ability to think, concentrate, drive, or remember things. Treatment, such as following a regular sleep routine and making some lifestyle changes, can help you manage your condition. This information is not intended to replace advice given to you by your health care provider. Make sure you discuss any questions you have with your healthcare provider. Document Revised: 05/30/2020 Document Reviewed: 05/30/2020 Elsevier Patient Education  2022 [employer]  

## 2021-01-29 NOTE — Progress Notes (Signed)
Subjective:    Patient ID: [name] [name], female    DOB: January 26, 1924, 87 y.o.   MRN: [id number]  HPI  Resident seen in a [facility] RN and resident report that she seemed excessively sleepy throughout the day, otherwise no new concerns She is sleeping well at night and often naps through the day.  Her appetite is good and her weight has been stable.  She does have some shortness of breath with exertion but denies chest pain.  She walks with a walker but is independent with ADLs.  Her bowels are okay.  She does have some urinary incontinence.  She reports her shoulder pain is controlled with her current pain regimen.  She reports her mood is good.  OA: Mainly in her shoulders.  Managed on Celebrex, Tylenol and Tramadol as prescribed.  OAB: With urinary incontinence.  Managed on Myrbetriq.  GERD: She denies breakthrough on Omeprazole.  Osteoporosis: She is taking a multivitamin OTC.  She gets weightbearing exercise and daily.  Insomnia: Managed with Melatonin.  RN reports she has been more sleepy than usual.  Aortic atherosclerosis/CHF: Compensated.  She is not on any diuretics at this time.  She is taking aspirin as prescribed.  Review of Systems     Past Medical History:  Diagnosis Date   Allergic rhinitis due to pollen    some shellfish also   Aortic sclerosis 5/12   On echo. no stenosis   Arthritis    Diastolic dysfunction    stress echo otherwise normal 1/04. Repeat 5/11 also negative   History of seasonal allergies    Hx of basal cell carcinoma 1990   multiple sites   Hx of squamous cell carcinoma of skin 2000   R nasolabial fold   Hyperlipidemia    Osteoarthritis, multiple sites    Osteoporosis    Overactive bladder    Urge incontinence    Vaginal odor 04/2018   aerobic vaginitis on One Swab culture    Current Outpatient Medications  Medication Sig Dispense Refill   acetaminophen (TYLENOL) 650 MG CR tablet Take 650 mg by mouth 3 (three) times daily.      aspirin EC 81 MG tablet Take 81 mg by mouth daily. Swallow whole.     celecoxib (CELEBREX) 200 MG capsule TAKE 1 CAPSULE DAILY 90 capsule 3   dorzolamide-timolol (COSOPT) 22.3-6.8 MG/ML ophthalmic solution Apply to eye.     EPIPEN 2-PAK 0.3 MG/0.3ML SOAJ injection Inject into the muscle once.     fluticasone (FLONASE) 50 MCG/ACT nasal spray Place 1 spray into both nostrils 2 (two) times a day.     latanoprost (XALATAN) 0.005 % ophthalmic solution      levocetirizine (XYZAL) 5 MG tablet Take 5 mg by mouth every evening.     melatonin 3 MG TABS tablet Take 3 mg by mouth at bedtime.     Multiple Vitamin (MULTIVITAMIN) tablet Take 1 tablet by mouth daily.     MYRBETRIQ 25 MG TB24 tablet      omeprazole (PRILOSEC) 20 MG capsule Take 1 capsule (20 mg total) by mouth daily. 30 capsule 3   Probiotic Product (PROBIOTIC ADVANCED PO) Take 1 capsule by mouth daily.     Simethicone (GAS-X PO) Take by mouth 2 (two) times daily.     No current facility-administered medications for this visit.    Allergies  Allergen Reactions   Demerol [Meperidine]     dizzy   Shellfish Allergy    Avelox [Moxifloxacin Hcl In  Nacl] Rash    Family History  Problem Relation Age of Onset   Cancer Father 59       colon cancer   Heart disease Father     Social History   Socioeconomic History   Marital status: Widowed    Spouse name: Not on file   Number of children: 3   Years of education: Not on file   Highest education level: Not on file  Occupational History   Occupation: Pharmacist, hospital    Comment: short time   Occupation: Herbalist    Comment: most of career  Tobacco Use   Smoking status: Former    Pack years: 0.00   Smokeless tobacco: Never  Vaping Use   Vaping Use: Never used  Substance and Sexual Activity   Alcohol use: Yes    Comment: wine before dinner   Drug use: No   Sexual activity: Not Currently    Birth control/protection: Post-menopausal  Other Topics Concern   Not on file  Social  History Narrative   Widowed 2019   Has living will   Daughter [name], is health care POA   Would accept trial of resuscitation   Would probably accept feeding tube   Social Determinants of Health   Financial Resource Strain: Not on file  Food Insecurity: Not on file  Transportation Needs: Not on file  Physical Activity: Not on file  Stress: Not on file  Social Connections: Not on file  Intimate Partner Violence: Not on file     Constitutional: Denies fever, malaise, fatigue, headache or abrupt weight changes.  HEENT: Denies eye pain, eye redness, ear pain, ringing in the ears, wax buildup, runny nose, nasal congestion, bloody nose, or sore throat. Respiratory: Patient reports shortness of breath with exertion.  Denies difficulty breathing, cough or sputum production.   Cardiovascular: Denies chest pain, chest tightness, palpitations or swelling in the hands or feet.  Gastrointestinal: Denies abdominal pain, bloating, constipation, diarrhea or blood in the stool.  GU: Patient reports urinary leakage.  Denies frequency, pain with urination, burning sensation, blood in urine, odor or discharge. Musculoskeletal: Patient reports right shoulder pain.  Denies decrease in range of motion, difficulty with gait, muscle pain or joint swelling.  Skin: Denies redness, rashes, lesions or ulcercations.  Neurological: Patient reports excessive sleepiness.  Denies dizziness, difficulty with memory, difficulty with speech or problems with balance and coordination.  Psych: Denies anxiety, depression, SI/HI.  No other specific complaints in a complete review of systems (except as listed in HPI above).  Objective:   Physical Exam   BP 138/72   Pulse 66   Temp 98.4 F (36.9 C)   Resp 20   Wt 151 lb 12.8 oz (68.9 kg)   BMI 25.26 kg/m  Wt Readings from Last 3 Encounters:  01/29/21 151 lb 12.8 oz (68.9 kg)  10/24/20 152 lb (68.9 kg)  08/01/20 155 lb 9.6 oz (70.6 kg)    General: Appears her  stated age, well developed, well nourished in NAD. HEENT: Head: normal shape and size; Eyes: sclera white EOMs intact;  Neck:  Neck supple, trachea midline. No masses, lumps or thyromegaly present.  Cardiovascular: Normal rate and rhythm. S1,S2 noted.  No murmur, rubs or gallops noted. No JVD or BLE edema.  Pulmonary/Chest: Normal effort and positive vesicular breath sounds. No respiratory distress. No wheezes, rales or ronchi noted.  Abdomen: Soft and nontender. Normal bowel sounds. No distention or masses noted.  Neurological: Alert and oriented.  Psychiatric: Mood  and affect normal.   BMET    Component Value Date/Time   NA 133 (L) 03/07/2018 1604   K 4.7 03/07/2018 1604   CL 99 03/07/2018 1604   CO2 29 03/07/2018 1604   GLUCOSE 102 (H) 03/07/2018 1604   BUN 18 03/07/2018 1604   CREATININE 0.90 03/29/2020 1114   CALCIUM 9.5 03/07/2018 1604   GFRNONAA >60 04/28/2017 0412   GFRAA >60 04/28/2017 0412    Lipid Panel     Component Value Date/Time   CHOL 232 (H) 02/06/2013 1108   TRIG 72.0 02/06/2013 1108   HDL 77.50 02/06/2013 1108   CHOLHDL 3 02/06/2013 1108   VLDL 14.4 02/06/2013 1108    CBC    Component Value Date/Time   WBC 8.4 03/07/2018 1604   RBC 4.07 03/07/2018 1604   HGB 13.0 03/07/2018 1604   HCT 38.6 03/07/2018 1604   PLT 288.0 03/07/2018 1604   MCV 94.9 03/07/2018 1604   MCH 31.4 04/28/2017 0412   MCHC 33.6 03/07/2018 1604   RDW 14.0 03/07/2018 1604   LYMPHSABS 4.2 (H) 04/28/2017 0412   MONOABS 1.1 (H) 04/28/2017 0412   EOSABS 0.0 04/28/2017 0412   BASOSABS 0.0 04/28/2017 0412    Hgb A1C No results found for: HGBA1C         Assessment & Plan:     Webb Silversmith, NP This visit occurred during the SARS-CoV-2 public health emergency.  Safety protocols were in place, including screening questions prior to the visit, additional usage of staff PPE, and extensive cleaning of exam room while observing appropriate contact time as indicated for  disinfecting solutions.

## 2021-01-29 NOTE — Assessment & Plan Note (Signed)
Continue Omeprazole for now

## 2021-01-29 NOTE — Assessment & Plan Note (Signed)
Will D/C Melatonin secondary to excessive daytime sleepiness

## 2021-01-29 NOTE — Assessment & Plan Note (Signed)
Continue Myrbetriq 

## 2021-01-29 NOTE — Assessment & Plan Note (Signed)
Continue aspirin 

## 2021-01-29 NOTE — Assessment & Plan Note (Signed)
Compensated off meds Will monitor

## 2021-01-29 NOTE — Assessment & Plan Note (Signed)
Managed on Celebrex, Tylenol and Tramadol Will monitor

## 2021-01-29 NOTE — Assessment & Plan Note (Signed)
Encouraged daily weightbearing exercise Continue daily multivitamin

## 2021-04-17 ENCOUNTER — Encounter: Payer: Self-pay | Admitting: Internal Medicine

## 2021-04-17 ENCOUNTER — Other Ambulatory Visit: Payer: Self-pay

## 2021-04-17 ENCOUNTER — Ambulatory Visit: Payer: Medicare Other | Admitting: Internal Medicine

## 2021-04-17 DIAGNOSIS — M159 Polyosteoarthritis, unspecified: Secondary | ICD-10-CM

## 2021-04-17 DIAGNOSIS — M8949 Other hypertrophic osteoarthropathy, multiple sites: Secondary | ICD-10-CM

## 2021-04-17 DIAGNOSIS — I872 Venous insufficiency (chronic) (peripheral): Secondary | ICD-10-CM

## 2021-04-17 DIAGNOSIS — K219 Gastro-esophageal reflux disease without esophagitis: Secondary | ICD-10-CM

## 2021-04-17 DIAGNOSIS — I5032 Chronic diastolic (congestive) heart failure: Secondary | ICD-10-CM

## 2021-04-17 DIAGNOSIS — N3941 Urge incontinence: Secondary | ICD-10-CM

## 2021-04-17 NOTE — Progress Notes (Signed)
Subjective:    Patient ID: [name] [name], female    DOB: 1923-10-05, 87 y.o.   MRN: [id number]  HPI Visit in assisted living apartment for review of chronic health conditions Reviewed status with Luellen Pucker RN  Has been active---travelling and going places with daughter Still feels tired still--sleeps easily Does have reasonable energy when she travels She just took a day trip to beach with a group from here Visited family in [place] area (flew into [place])  Ongoing shoulder pain Continues on the celebrex Has seen the orthopedist---occasional injections without much help Has arm weakness Tramadol at bedtime has helped her sleep most nights  No chest pain--other than gas pains in stomach Does use the simethicone at times No SOB Some edema----can be all day Some low calf soreness Sleeps on 1 pillow--no PND No dizziness or syncope  Still has occasional urge urinary incontinence Still feels the myrbetriq helps most of the time  No heartburn  No dysphagia  Current Outpatient Medications on File Prior to Visit  Medication Sig Dispense Refill   acetaminophen (TYLENOL) 650 MG CR tablet Take 650 mg by mouth 3 (three) times daily.     aspirin EC 81 MG tablet Take 81 mg by mouth daily. Swallow whole.     celecoxib (CELEBREX) 200 MG capsule TAKE 1 CAPSULE DAILY 90 capsule 3   dorzolamide-timolol (COSOPT) 22.3-6.8 MG/ML ophthalmic solution Apply to eye.     EPIPEN 2-PAK 0.3 MG/0.3ML SOAJ injection Inject into the muscle once.     fluticasone (FLONASE) 50 MCG/ACT nasal spray Place 1 spray into both nostrils 2 (two) times a day.     latanoprost (XALATAN) 0.005 % ophthalmic solution      levocetirizine (XYZAL) 5 MG tablet Take 5 mg by mouth every evening.     melatonin 3 MG TABS tablet Take 3 mg by mouth at bedtime.     Multiple Vitamin (MULTIVITAMIN) tablet Take 1 tablet by mouth daily.     MYRBETRIQ 25 MG TB24 tablet      omeprazole (PRILOSEC) 20 MG capsule Take 1 capsule  (20 mg total) by mouth daily. 30 capsule 3   Probiotic Product (PROBIOTIC ADVANCED PO) Take 1 capsule by mouth daily.     Simethicone (GAS-X PO) Take by mouth 2 (two) times daily.     traMADol (ULTRAM) 50 MG tablet Take 50 mg by mouth at bedtime.     No current facility-administered medications on file prior to visit.    Allergies  Allergen Reactions   Demerol [Meperidine]     dizzy   Shellfish Allergy    Avelox [Moxifloxacin Hcl In Nacl] Rash    Past Medical History:  Diagnosis Date   Allergic rhinitis due to pollen    some shellfish also   Aortic sclerosis 5/12   On echo. no stenosis   Arthritis    Diastolic dysfunction    stress echo otherwise normal 1/04. Repeat 5/11 also negative   History of seasonal allergies    Hx of basal cell carcinoma 1990   multiple sites   Hx of squamous cell carcinoma of skin 2000   R nasolabial fold   Hyperlipidemia    Osteoarthritis, multiple sites    Osteoporosis    Overactive bladder    Urge incontinence    Vaginal odor 04/2018   aerobic vaginitis on One Swab culture    Past Surgical History:  Procedure Laterality Date   CYSTOCELE REPAIR  2007   and rectocele   DILATION  AND CURETTAGE OF UTERUS     INGUINAL HERNIA REPAIR  2012   left side with mesh   REVERSE SHOULDER ARTHROPLASTY Right 04/27/2017   Procedure: REVERSE SHOULDER ARTHROPLASTY;  Surgeon: Corky Mull, MD;  Location: [facility];  Service: Orthopedics;  Laterality: Right;   TONSILLECTOMY AND ADENOIDECTOMY     as child   TOTAL HIP ARTHROPLASTY Right 05/23/2015   Procedure: TOTAL HIP ARTHROPLASTY ANTERIOR APPROACH;  Surgeon: Hessie Knows, MD;  Location: [facility];  Service: Orthopedics;  Laterality: Right;   UMBILICAL HERNIA REPAIR  02/04/06    Family History  Problem Relation Age of Onset   Cancer Father 5       colon cancer   Heart disease Father     Social History   Socioeconomic History   Marital status: Widowed    Spouse name: Not on file   Number of  children: 3   Years of education: Not on file   Highest education level: Not on file  Occupational History   Occupation: Pharmacist, hospital    Comment: short time   Occupation: Herbalist    Comment: most of career  Tobacco Use   Smoking status: Former   Smokeless tobacco: Never  Scientific laboratory technician Use: Never used  Substance and Sexual Activity   Alcohol use: Yes    Comment: wine before dinner   Drug use: No   Sexual activity: Not Currently    Birth control/protection: Post-menopausal  Other Topics Concern   Not on file  Social History Narrative   Widowed 2019   Has living will   Daughter [name], is health care POA   Would accept trial of resuscitation   Would probably accept feeding tube   Social Determinants of Health   Financial Resource Strain: Not on file  Food Insecurity: Not on file  Transportation Needs: Not on file  Physical Activity: Not on file  Stress: Not on file  Social Connections: Not on file  Intimate Partner Violence: Not on file   Review of Systems Appetite "not very good but I eat" Weight stable Some left calf cramps--if sitting too long     Objective:   Physical Exam Constitutional:      Appearance: Normal appearance.  Cardiovascular:     Rate and Rhythm: Normal rate and regular rhythm.     Heart sounds: No murmur heard.   No gallop.  Pulmonary:     Effort: Pulmonary effort is normal.     Breath sounds: Normal breath sounds. No wheezing or rales.  Abdominal:     Palpations: Abdomen is soft.     Tenderness: There is no abdominal tenderness.  Musculoskeletal:     Cervical back: Neck supple.     Comments: Calves are tight and mildly tender --but not really swollen  Lymphadenopathy:     Cervical: No cervical adenopathy.  Neurological:     General: No focal deficit present.     Mental Status: She is alert.           Assessment & Plan:

## 2021-04-17 NOTE — Assessment & Plan Note (Signed)
myrbetriq helps quite a bit---will continue BP has been okay

## 2021-04-17 NOTE — Assessment & Plan Note (Signed)
No sig edema but does have tightness Will try support hose to see if that helps her discomfort

## 2021-04-17 NOTE — Assessment & Plan Note (Signed)
No decompensation despite no meds No action

## 2021-04-17 NOTE — Assessment & Plan Note (Signed)
Okay with PPI Has gasiness as well---simethicone helps

## 2021-04-17 NOTE — Assessment & Plan Note (Signed)
Shoulders are the worst On celebrex Sleeps better now with nightly tramadol

## 2021-05-29 ENCOUNTER — Other Ambulatory Visit: Payer: Self-pay | Admitting: Internal Medicine

## 2021-05-29 MED ORDER — TRAMADOL HCL 50 MG PO TABS
50.0000 mg | ORAL_TABLET | Freq: Every day | ORAL | 0 refills | Status: DC
Start: 2021-05-29 — End: 2021-06-30

## 2021-06-19 ENCOUNTER — Other Ambulatory Visit: Payer: Self-pay | Admitting: Internal Medicine

## 2021-06-19 MED ORDER — MYRBETRIQ 25 MG PO TB24: 25.0000 mg | ORAL_TABLET | Freq: Every day | ORAL | 1 refills | Status: DC

## 2021-06-24 ENCOUNTER — Other Ambulatory Visit: Payer: Self-pay | Admitting: Internal Medicine

## 2021-06-24 NOTE — Telephone Encounter (Signed)
Requested medications are due for refill today.  yes  Requested medications are on the active medications list.  yes  Last refill. 05/29/2021  Future visit scheduled.   no  Notes to clinic.  Medication not delegated.PCP listed as Dr. Silvio Pate,

## 2021-07-06 ENCOUNTER — Other Ambulatory Visit: Payer: Self-pay

## 2021-07-06 ENCOUNTER — Emergency Department: Payer: Medicare Other

## 2021-07-06 ENCOUNTER — Inpatient Hospital Stay
Admission: EM | Admit: 2021-07-06 | Discharge: 2021-07-08 | DRG: 872 | Disposition: A | Discharge status: Home Health | Payer: Medicare Other | Source: Skilled Nursing Facility | Attending: Internal Medicine | Admitting: Internal Medicine

## 2021-07-06 DIAGNOSIS — E871 Hypo-osmolality and hyponatremia: Secondary | ICD-10-CM | POA: Diagnosis present

## 2021-07-06 DIAGNOSIS — Z20822 Contact with and (suspected) exposure to covid-19: Secondary | ICD-10-CM | POA: Diagnosis present

## 2021-07-06 DIAGNOSIS — E869 Volume depletion, unspecified: Secondary | ICD-10-CM | POA: Diagnosis present

## 2021-07-06 DIAGNOSIS — Z79899 Other long term (current) drug therapy: Secondary | ICD-10-CM

## 2021-07-06 DIAGNOSIS — Z8249 Family history of ischemic heart disease and other diseases of the circulatory system: Secondary | ICD-10-CM | POA: Diagnosis not present

## 2021-07-06 DIAGNOSIS — I5032 Chronic diastolic (congestive) heart failure: Secondary | ICD-10-CM

## 2021-07-06 DIAGNOSIS — A419 Sepsis, unspecified organism: Principal | ICD-10-CM | POA: Diagnosis present

## 2021-07-06 DIAGNOSIS — K529 Noninfective gastroenteritis and colitis, unspecified: Secondary | ICD-10-CM | POA: Diagnosis present

## 2021-07-06 DIAGNOSIS — R55 Syncope and collapse: Secondary | ICD-10-CM

## 2021-07-06 DIAGNOSIS — A09 Infectious gastroenteritis and colitis, unspecified: Secondary | ICD-10-CM | POA: Diagnosis present

## 2021-07-06 DIAGNOSIS — K5900 Constipation, unspecified: Secondary | ICD-10-CM | POA: Diagnosis present

## 2021-07-06 DIAGNOSIS — Z96611 Presence of right artificial shoulder joint: Secondary | ICD-10-CM | POA: Diagnosis present

## 2021-07-06 DIAGNOSIS — Z888 Allergy status to other drugs, medicaments and biological substances status: Secondary | ICD-10-CM

## 2021-07-06 DIAGNOSIS — Z7982 Long term (current) use of aspirin: Secondary | ICD-10-CM

## 2021-07-06 DIAGNOSIS — J301 Allergic rhinitis due to pollen: Secondary | ICD-10-CM | POA: Diagnosis present

## 2021-07-06 DIAGNOSIS — Z96641 Presence of right artificial hip joint: Secondary | ICD-10-CM | POA: Diagnosis present

## 2021-07-06 DIAGNOSIS — Z91013 Allergy to seafood: Secondary | ICD-10-CM | POA: Diagnosis not present

## 2021-07-06 DIAGNOSIS — Z800 Family history of malignant neoplasm of digestive organs: Secondary | ICD-10-CM | POA: Diagnosis not present

## 2021-07-06 DIAGNOSIS — K219 Gastro-esophageal reflux disease without esophagitis: Secondary | ICD-10-CM | POA: Diagnosis present

## 2021-07-06 DIAGNOSIS — Z87891 Personal history of nicotine dependence: Secondary | ICD-10-CM

## 2021-07-06 LAB — TROPONIN I (HIGH SENSITIVITY)
Troponin I (High Sensitivity): 9 ng/L (ref <18)
Troponin I (High Sensitivity): 6 ng/L (ref <18)

## 2021-07-06 LAB — LACTIC ACID, PLASMA
Lactic Acid, Venous: 1.2 mmol/L (ref 0.5–1.9)
Lactic Acid, Venous: 0.6 mmol/L (ref 0.5–1.9)

## 2021-07-06 LAB — CBC WITH DIFFERENTIAL/PLATELET
Abs Immature Granulocytes: 0.07 10*3/uL (ref 0.00–0.07)
Basophils Absolute: 0.1 10*3/uL (ref 0.0–0.1)
Basophils Relative: 0 %
Eosinophils Absolute: 0.3 10*3/uL (ref 0.0–0.5)
Eosinophils Relative: 2 %
HCT: 34.6 % — ABNORMAL LOW (ref 36.0–46.0)
Hemoglobin: 11.2 g/dL — ABNORMAL LOW (ref 12.0–15.0)
Immature Granulocytes: 1 %
Lymphocytes Relative: 37 %
Lymphs Abs: 5.6 10*3/uL — ABNORMAL HIGH (ref 0.7–4.0)
MCH: 33.5 pg (ref 26.0–34.0)
MCHC: 32.4 g/dL (ref 30.0–36.0)
MCV: 103.6 fL — ABNORMAL HIGH (ref 80.0–100.0)
Monocytes Absolute: 0.8 10*3/uL (ref 0.1–1.0)
Monocytes Relative: 5 %
Neutro Abs: 8.3 10*3/uL — ABNORMAL HIGH (ref 1.7–7.7)
Neutrophils Relative %: 55 %
Platelets: 298 10*3/uL (ref 150–400)
RBC: 3.34 MIL/uL — ABNORMAL LOW (ref 3.87–5.11)
RDW: 13.8 % (ref 11.5–15.5)
WBC: 15.2 10*3/uL — ABNORMAL HIGH (ref 4.0–10.5)
nRBC: 0 % (ref 0.0–0.2)

## 2021-07-06 LAB — GASTROINTESTINAL PANEL BY PCR, STOOL (REPLACES STOOL CULTURE)

## 2021-07-06 LAB — PROTIME-INR
INR: 1 (ref 0.8–1.2)
Prothrombin Time: 13.3 seconds (ref 11.4–15.2)

## 2021-07-06 LAB — COMPREHENSIVE METABOLIC PANEL
ALT: 14 U/L (ref 0–44)
AST: 25 U/L (ref 15–41)
Albumin: 3.6 g/dL (ref 3.5–5.0)
Alkaline Phosphatase: 80 U/L (ref 38–126)
Anion gap: 9 (ref 5–15)
BUN: 39 mg/dL — ABNORMAL HIGH (ref 8–23)
CO2: 20 mmol/L — ABNORMAL LOW (ref 22–32)
Calcium: 8.7 mg/dL — ABNORMAL LOW (ref 8.9–10.3)
Chloride: 101 mmol/L (ref 98–111)
Creatinine, Ser: 0.99 mg/dL (ref 0.44–1.00)
GFR, Estimated: 53 mL/min — ABNORMAL LOW (ref >60)
Glucose, Bld: 126 mg/dL — ABNORMAL HIGH (ref 70–99)
Potassium: 4.7 mmol/L (ref 3.5–5.1)
Sodium: 130 mmol/L — ABNORMAL LOW (ref 135–145)
Total Bilirubin: 0.9 mg/dL (ref 0.3–1.2)
Total Protein: 6.3 g/dL — ABNORMAL LOW (ref 6.5–8.1)

## 2021-07-06 LAB — URINALYSIS, COMPLETE (UACMP) WITH MICROSCOPIC
Bacteria, UA: NONE SEEN
Glucose, UA: NEGATIVE mg/dL
Leukocytes,Ua: NEGATIVE
Nitrite: NEGATIVE
Specific Gravity, Urine: 1.015 (ref 1.005–1.030)
pH: 5.5 (ref 5.0–8.0)

## 2021-07-06 LAB — PROCALCITONIN: Procalcitonin: 0.86 ng/mL

## 2021-07-06 LAB — C DIFFICILE QUICK SCREEN W PCR REFLEX
C Diff antigen: NEGATIVE
C Diff interpretation: NOT DETECTED
C Diff toxin: NEGATIVE

## 2021-07-06 LAB — RESP PANEL BY RT-PCR (FLU A&B, COVID) ARPGX2
Influenza A by PCR: NEGATIVE
Influenza B by PCR: NEGATIVE
SARS Coronavirus 2 by RT PCR: NEGATIVE

## 2021-07-06 LAB — BRAIN NATRIURETIC PEPTIDE: B Natriuretic Peptide: 83.7 pg/mL (ref 0.0–100.0)

## 2021-07-06 LAB — LIPASE, BLOOD: Lipase: 33 U/L (ref 11–51)

## 2021-07-06 MED ORDER — ASPIRIN EC 81 MG PO TBEC
81.0000 mg | DELAYED_RELEASE_TABLET | Freq: Every day | ORAL | Status: DC
  Administered 2021-07-06 – 2021-07-08 (×3): 81 mg via ORAL
  Filled 2021-07-06 (×3): qty 1

## 2021-07-06 MED ORDER — ENOXAPARIN SODIUM 40 MG/0.4ML IJ SOSY
40.0000 mg | PREFILLED_SYRINGE | INTRAMUSCULAR | Status: DC
  Administered 2021-07-06 – 2021-07-08 (×3): 40 mg via SUBCUTANEOUS
  Filled 2021-07-06 (×3): qty 0.4

## 2021-07-06 MED ORDER — PIPERACILLIN-TAZOBACTAM 3.375 G IVPB 30 MIN
3.3750 g | Freq: Once | INTRAVENOUS | Status: AC
  Administered 2021-07-06: 04:00:00 3.375 g via INTRAVENOUS
  Filled 2021-07-06: qty 50

## 2021-07-06 MED ORDER — LORATADINE 10 MG PO TABS
10.0000 mg | ORAL_TABLET | Freq: Every evening | ORAL | Status: DC
  Administered 2021-07-06 – 2021-07-07 (×2): 10 mg via ORAL
  Filled 2021-07-06 (×2): qty 1

## 2021-07-06 MED ORDER — PANTOPRAZOLE SODIUM 40 MG PO TBEC
40.0000 mg | DELAYED_RELEASE_TABLET | Freq: Every day | ORAL | Status: DC
  Administered 2021-07-06 – 2021-07-08 (×3): 40 mg via ORAL
  Filled 2021-07-06 (×3): qty 1

## 2021-07-06 MED ORDER — LATANOPROST 0.005 % OP SOLN
1.0000 [drp] | Freq: Every day | OPHTHALMIC | Status: DC
  Administered 2021-07-06 – 2021-07-07 (×2): 1 [drp] via OPHTHALMIC
  Filled 2021-07-06: qty 2.5

## 2021-07-06 MED ORDER — HYDRALAZINE HCL 20 MG/ML IJ SOLN: 5.0000 mg | INTRAMUSCULAR | Status: DC | PRN

## 2021-07-06 MED ORDER — ACETAMINOPHEN 325 MG PO TABS
650.0000 mg | ORAL_TABLET | Freq: Four times a day (QID) | ORAL | Status: DC | PRN
  Administered 2021-07-06 (×2): 650 mg via ORAL
  Filled 2021-07-06 (×3): qty 2

## 2021-07-06 MED ORDER — SIMETHICONE 80 MG PO CHEW
80.0000 mg | CHEWABLE_TABLET | Freq: Two times a day (BID) | ORAL | Status: DC
  Administered 2021-07-06 – 2021-07-08 (×5): 80 mg via ORAL
  Filled 2021-07-06 (×8): qty 1

## 2021-07-06 MED ORDER — PIPERACILLIN-TAZOBACTAM 3.375 G IVPB
3.3750 g | Freq: Three times a day (TID) | INTRAVENOUS | Status: DC
  Administered 2021-07-06 – 2021-07-07 (×3): 3.375 g via INTRAVENOUS
  Filled 2021-07-06 (×3): qty 50

## 2021-07-06 MED ORDER — FLUTICASONE PROPIONATE 50 MCG/ACT NA SUSP
1.0000 | Freq: Every day | NASAL | Status: DC
  Administered 2021-07-06 – 2021-07-08 (×3): 1 via NASAL
  Filled 2021-07-06: qty 16

## 2021-07-06 MED ORDER — MIRABEGRON ER 25 MG PO TB24
25.0000 mg | ORAL_TABLET | Freq: Every day | ORAL | Status: DC
  Administered 2021-07-06 – 2021-07-08 (×3): 25 mg via ORAL
  Filled 2021-07-06 (×3): qty 1

## 2021-07-06 MED ORDER — RISAQUAD PO CAPS
1.0000 | ORAL_CAPSULE | Freq: Every day | ORAL | Status: DC
  Administered 2021-07-06 – 2021-07-08 (×3): 1 via ORAL
  Filled 2021-07-06 (×3): qty 1

## 2021-07-06 MED ORDER — ADULT MULTIVITAMIN W/MINERALS CH
1.0000 | ORAL_TABLET | Freq: Every day | ORAL | Status: DC
  Administered 2021-07-06 – 2021-07-08 (×3): 1 via ORAL
  Filled 2021-07-06 (×3): qty 1

## 2021-07-06 MED ORDER — MORPHINE SULFATE (PF) 2 MG/ML IV SOLN: 0.5000 mg | INTRAVENOUS | Status: DC | PRN

## 2021-07-06 MED ORDER — IOHEXOL 300 MG/ML  SOLN
80.0000 mL | Freq: Once | INTRAMUSCULAR | Status: AC | PRN
  Administered 2021-07-06: 03:00:00 80 mL via INTRAVENOUS

## 2021-07-06 MED ORDER — DORZOLAMIDE HCL-TIMOLOL MAL 2-0.5 % OP SOLN
1.0000 [drp] | Freq: Two times a day (BID) | OPHTHALMIC | Status: DC
  Administered 2021-07-06 – 2021-07-08 (×5): 1 [drp] via OPHTHALMIC
  Filled 2021-07-06: qty 10

## 2021-07-06 MED ORDER — ONDANSETRON HCL 4 MG/2ML IJ SOLN: 4.0000 mg | Freq: Three times a day (TID) | INTRAMUSCULAR | Status: DC | PRN

## 2021-07-06 MED ORDER — SODIUM CHLORIDE 0.9 % IV BOLUS
1000.0000 mL | Freq: Once | INTRAVENOUS | Status: AC
  Administered 2021-07-06: 03:00:00 1000 mL via INTRAVENOUS

## 2021-07-06 MED ORDER — TRAMADOL HCL 50 MG PO TABS
50.0000 mg | ORAL_TABLET | Freq: Every evening | ORAL | Status: DC
  Administered 2021-07-06 – 2021-07-07 (×2): 50 mg via ORAL
  Filled 2021-07-06 (×2): qty 1

## 2021-07-06 MED ORDER — MELATONIN 5 MG PO TABS
5.0000 mg | ORAL_TABLET | Freq: Every day | ORAL | Status: DC
  Administered 2021-07-06: 21:00:00 5 mg via ORAL
  Filled 2021-07-06 (×3): qty 1

## 2021-07-06 NOTE — Progress Notes (Signed)
Pharmacy Antibiotic Note  [name] [name] is a 87 y.o. female w/ PMH of OA, d CHF, HLD admitted on 07/06/2021 with  acute colitis .  Pharmacy has been consulted for Zosyn dosing.  Plan: start Zosyn 3.375g IV q8h (4 hour infusion).  Height: 5\' 6"  (167.6 cm) Weight: 73.1 kg (161 lb 2.5 oz) IBW/kg (Calculated) : 59.3  Temp (24hrs), Avg:97.8 F (36.6 C), Min:97.8 F (36.6 C), Max:97.8 F (36.6 C)  Recent Labs  Lab 07/06/21 0134  WBC 15.2*  CREATININE 0.99    Estimated Creatinine Clearance: 34.8 mL/min (by C-G formula based on SCr of 0.99 mg/dL).    Allergies  Allergen Reactions   Demerol [Meperidine]     dizzy   Shellfish Allergy    Avelox [Moxifloxacin Hcl In Nacl] Rash    Antimicrobials this admission: 12/04 Zosyn >>   Microbiology results: 12/04 BCx: pending 12/04 SARS CoV-2: negative 12/04 influenza A/B: negative  Thank you for allowing pharmacy to be a part of this patient's care.  Dallie Piles 07/06/2021 8:00 AM

## 2021-07-06 NOTE — ED Notes (Signed)
Med list provided to pharm tech for med rec.

## 2021-07-06 NOTE — ED Notes (Signed)
Rn to bedside to introduce self to pt. Pt CAO and in no distress. Family at bedside.

## 2021-07-06 NOTE — ED Notes (Signed)
Rn and tech changed brief and placed new purewick. Pt had episode of diarrhea.

## 2021-07-06 NOTE — ED Notes (Signed)
Pt asking for tylenol. Will ask MD>

## 2021-07-06 NOTE — ED Provider Notes (Signed)
[facility] Emergency Department Provider Note  ____________________________________________  Time seen: Approximately 5:50 AM  I have reviewed the triage vital signs and the nursing notes.   HISTORY  Chief Complaint Loss of Consciousness   HPI [name] [name] is a 87 y.o. female with a history of osteoarthritis, osteoporosis, diastolic CHF, hyperlipidemia who presents for syncopal event.  Patient reports that she had a stomach upset most of the day yesterday with some diarrhea.  This morning she got up to go to the bathroom and she felt lightheaded like she was going to pass out.  She assisted herself to the floor but never really syncopized.  She called for help.  EMS was called and when they try to stand her up she had a syncopal event but was held by them.  She has never collapsed or hit her head.  After she regained consciousness patient started vomiting.  She is complaining of diffuse crampy abdominal pain since yesterday morning.  She denies chest pain or shortness of breath, cough or congestion, fever or chills, dysuria or hematuria.  Patient has no history of dementia  Past Medical History:  Diagnosis Date   Allergic rhinitis due to pollen    some shellfish also   Aortic sclerosis 5/12   On echo. no stenosis   Arthritis    Diastolic dysfunction    stress echo otherwise normal 1/04. Repeat 5/11 also negative   History of seasonal allergies    Hx of basal cell carcinoma 1990   multiple sites   Hx of squamous cell carcinoma of skin 2000   R nasolabial fold   Hyperlipidemia    Osteoarthritis, multiple sites    Osteoporosis    Overactive bladder    Urge incontinence    Vaginal odor 04/2018   aerobic vaginitis on One Swab culture    Patient Active Problem List   Diagnosis Date Noted   Peripheral venous insufficiency 04/17/2021   Atherosclerosis of aorta ([place]) 04/25/2020   Insomnia 01/27/2020   GERD (gastroesophageal reflux disease)  01/27/2020   Chronic constipation 08/18/2018   Allergic rhinitis due to pollen    Osteoporosis    Osteoarthritis, multiple sites    Urge incontinence    Chronic diastolic heart failure [facility])     Past Surgical History:  Procedure Laterality Date   CYSTOCELE REPAIR  2007   and rectocele   DILATION AND CURETTAGE OF UTERUS     INGUINAL HERNIA REPAIR  2012   left side with mesh   REVERSE SHOULDER ARTHROPLASTY Right 04/27/2017   Procedure: REVERSE SHOULDER ARTHROPLASTY;  Surgeon: Corky Mull, MD;  Location: [facility] ORS;  Service: Orthopedics;  Laterality: Right;   TONSILLECTOMY AND ADENOIDECTOMY     as child   TOTAL HIP ARTHROPLASTY Right 05/23/2015   Procedure: TOTAL HIP ARTHROPLASTY ANTERIOR APPROACH;  Surgeon: Hessie Knows, MD;  Location: [facility] ORS;  Service: Orthopedics;  Laterality: Right;   UMBILICAL HERNIA REPAIR  02/04/06    Prior to Admission medications   Medication Sig Start Date End Date Taking? Authorizing Provider  acetaminophen (TYLENOL) 650 MG CR tablet Take 650 mg by mouth 3 (three) times daily.    [provider]  aspirin EC 81 MG tablet Take 81 mg by mouth daily. Swallow whole.    [provider]  celecoxib (CELEBREX) 200 MG capsule TAKE 1 CAPSULE DAILY 08/12/20   Venia Carbon, MD  dorzolamide-timolol (COSOPT) 22.3-6.8 MG/ML ophthalmic solution Apply to eye. 02/27/15   [provider]  EPIPEN 2-PAK 0.3 MG/0.3ML SOAJ injection Inject into the muscle once. 06/08/19   [provider]  fluticasone (FLONASE) 50 MCG/ACT nasal spray Place 1 spray into both nostrils 2 (two) times a day.    [provider]  latanoprost (XALATAN) 0.005 % ophthalmic solution  02/28/18   [provider]  levocetirizine (XYZAL) 5 MG tablet Take 5 mg by mouth every evening.    [provider]  melatonin 3 MG TABS tablet Take 3 mg by mouth at bedtime.    [provider]  Multiple Vitamin (MULTIVITAMIN) tablet Take 1 tablet by  mouth daily.    [provider]  MYRBETRIQ 25 MG TB24 tablet Take 1 tablet (25 mg total) by mouth daily. 06/19/21   Jearld Fenton, NP  omeprazole (PRILOSEC) 20 MG capsule Take 1 capsule (20 mg total) by mouth daily. 06/21/19   Jearld Fenton, NP  Probiotic Product (PROBIOTIC ADVANCED PO) Take 1 capsule by mouth daily.    [provider]  Simethicone (GAS-X PO) Take by mouth 2 (two) times daily.    [provider]  traMADol (ULTRAM) 50 MG tablet TAKE ONE TABLET BY MOUTH AT BEDTIME 06/30/21   Jearld Fenton, NP    Allergies Demerol [meperidine], Shellfish allergy, and Avelox [moxifloxacin hcl in nacl]  Family History  Problem Relation Age of Onset   Cancer Father 19       colon cancer   Heart disease Father     Social History Social History   Tobacco Use   Smoking status: Former   Smokeless tobacco: Never  Scientific laboratory technician Use: Never used  Substance Use Topics   Alcohol use: Yes    Comment: wine before dinner   Drug use: No    Review of Systems  Constitutional: Negative for fever. + syncope Eyes: Negative for visual changes. ENT: Negative for sore throat. Neck: No neck pain  Cardiovascular: Negative for chest pain. Respiratory: Negative for shortness of breath. Gastrointestinal: + abdominal pain, vomiting and diarrhea. Genitourinary: Negative for dysuria. Musculoskeletal: Negative for back pain. Skin: Negative for rash. Neurological: Negative for headaches, weakness or numbness. Psych: No SI or HI  ____________________________________________   PHYSICAL EXAM:  VITAL SIGNS: ED Triage Vitals  Enc Vitals Group     BP 07/06/21 0117 (!) 145/69     Pulse Rate 07/06/21 0117 63     Resp 07/06/21 0117 (!) 22     Temp 07/06/21 0123 97.8 F (36.6 C)     Temp Source 07/06/21 0123 Oral     SpO2 07/06/21 0117 95 %     Weight 07/06/21 0117 161 lb 2.5 oz (73.1 kg)     Height 07/06/21 0117 5\' 6"  (1.676 m)     Head Circumference --       Peak Flow --      Pain Score 07/06/21 0117 3     Pain Loc --      Pain Edu? --      Excl. in [place]? --     Constitutional: Alert and oriented. Well appearing and in no apparent distress. HEENT:      Head: Normocephalic and atraumatic.         Eyes: Conjunctivae are normal. Sclera is non-icteric.       Mouth/Throat: Mucous membranes are moist.       Neck: Supple with no signs of meningismus. Cardiovascular: Regular rate and rhythm. No murmurs, gallops, or rubs. 2+ symmetrical distal pulses  are present in all extremities. No JVD. Respiratory: Normal respiratory effort. Lungs are clear to auscultation bilaterally.  Gastrointestinal: Soft, nondistended, mild diffuse tenderness with no localized tenderness, rebound or guarding. Genitourinary: No CVA tenderness. Musculoskeletal:  No edema, cyanosis, or erythema of extremities. Neurologic: Normal speech and language. Face is symmetric. Moving all extremities. No gross focal neurologic deficits are appreciated. Skin: Skin is warm, dry and intact. No rash noted. Psychiatric: Mood and affect are normal. Speech and behavior are normal.  ____________________________________________   LABS (all labs ordered are listed, but only abnormal results are displayed)  Labs Reviewed  CBC WITH DIFFERENTIAL/PLATELET - Abnormal; Notable for the following components:      Result Value   WBC 15.2 (*)    RBC 3.34 (*)    Hemoglobin 11.2 (*)    HCT 34.6 (*)    MCV 103.6 (*)    Neutro Abs 8.3 (*)    Lymphs Abs 5.6 (*)    All other components within normal limits  COMPREHENSIVE METABOLIC PANEL - Abnormal; Notable for the following components:   Sodium 130 (*)    CO2 20 (*)    Glucose, Bld 126 (*)    BUN 39 (*)    Calcium 8.7 (*)    Total Protein 6.3 (*)    GFR, Estimated 53 (*)    All other components within normal limits  RESP PANEL BY RT-PCR (FLU A&B, COVID) ARPGX2  LIPASE, BLOOD  URINALYSIS, COMPLETE (UACMP) WITH MICROSCOPIC  TROPONIN I (HIGH  SENSITIVITY)  TROPONIN I (HIGH SENSITIVITY)   ____________________________________________  EKG  ED ECG REPORT I, Rudene Re, the attending physician, personally viewed and interpreted this ECG.  Sinus rhythm with a left bundle branch block, borderline prolonged QTC with no ST elevations or depressions.  Unchanged when compared to prior from 2018 ____________________________________________  RADIOLOGY  I have personally reviewed the images performed during this visit and I agree with the Radiologist's read.   Interpretation by Radiologist:  CT ABDOMEN PELVIS W CONTRAST  Result Date: 07/06/2021 CLINICAL DATA:  Acute nonlocalized abdominal pain.  Found down. EXAM: CT ABDOMEN AND PELVIS WITH CONTRAST TECHNIQUE: Multidetector CT imaging of the abdomen and pelvis was performed using the standard protocol following bolus administration of intravenous contrast. CONTRAST:  77mL OMNIPAQUE IOHEXOL 300 MG/ML  SOLN COMPARISON:  03/29/2020 FINDINGS: LOWER CHEST: Bibasilar atelectasis. HEPATOBILIARY: Normal hepatic contours. No intra- or extrahepatic biliary dilatation. Distended gallbladder. PANCREAS: Normal pancreas. No ductal dilatation or peripancreatic fluid collection. SPLEEN: Normal. ADRENALS/URINARY TRACT: The adrenal glands are normal. No hydronephrosis, nephroureterolithiasis or solid renal mass. The urinary bladder is normal for degree of distention STOMACH/BOWEL: Small sliding hiatal hernia. Normal duodenal course and caliber. No small bowel dilatation or inflammation. Large amount of stool throughout the colon. Wall thickening of the proximal descending colon. VASCULAR/LYMPHATIC: There is calcific atherosclerosis of the abdominal aorta. No lymphadenopathy. REPRODUCTIVE: Normal uterus. No adnexal mass. MUSCULOSKELETAL. No bony spinal canal stenosis or focal osseous abnormality. OTHER: Small left lower quadrant abdominal hernia that contains small bowel without evidence of obstruction.  IMPRESSION: 1. Wall thickening of the proximal descending colon may indicate mild colitis. 2. Large amount of stool throughout the colon. 3. Small sliding hiatal hernia. Aortic Atherosclerosis (ICD10-I70.0). Electronically Signed   By: Ulyses Jarred M.D.   On: 07/06/2021 03:21     ____________________________________________   PROCEDURES  Procedure(s) performed:yes .1-3 Lead EKG Interpretation Performed by: Rudene Re, MD Authorized by: Rudene Re, MD     Interpretation: abnormal  ECG rate assessment: normal     Rhythm: sinus rhythm     Ectopy: none     Conduction: abnormal     Critical Care performed:  None ____________________________________________   INITIAL IMPRESSION / ASSESSMENT AND PLAN / ED COURSE  87 y.o. female with a history of osteoarthritis, osteoporosis, diastolic CHF, hyperlipidemia who presents for syncopal event in the setting of 1 day of diffuse crampy abdominal pain, vomiting and diarrhea.  Patient is well-appearing on arrival and in no distress, abdomen is mildly diffuse tender with no localized tenderness, rebound or guarding, no distention, normal vital signs.  EKG showing left bundle branch block which is old for patient.  COVID and flu negative.  CT abdomen pelvis consistent with colitis.  Patient did not have any further episodes of vomiting since receiving Zofran per EMS but had several bowel movements in the emergency room consistent with a diagnosis.  Syncope most likely vasovagal in the setting of colitis.  2 high-sensitivity troponins were negative.  She does have an elevated white count consistent with infection.  Mild hyponatremia with a sodium of 130, normal kidney function, no other significant electrolyte derangements.  Patient given IV fluids, IV Zosyn. Based on patient's age, hyponatremia, syncope, and colitis will admit for hydration, antibiotics, and syncope evaluation.  She is on telemetry for close monitoring of cardiorespiratory  status. Noticed to drop sats to 88-91% while sleeping. Sats return to normal once awake. Denies CP or SOB. CXR pending.       _____________________________________________ Please note:  Patient was evaluated in Emergency Department today for the symptoms described in the history of present illness. Patient was evaluated in the context of the global COVID-19 pandemic, which necessitated consideration that the patient might be at risk for infection with the SARS-CoV-2 virus that causes COVID-19. Institutional protocols and algorithms that pertain to the evaluation of patients at risk for COVID-19 are in a state of rapid change based on information released by regulatory bodies including the CDC and federal and state organizations. These policies and algorithms were followed during the patient's care in the ED.  Some ED evaluations and interventions may be delayed as a result of limited staffing during the pandemic.   [place] Controlled Substance Database was reviewed by me. ____________________________________________   FINAL CLINICAL IMPRESSION(S) / ED DIAGNOSES   Final diagnoses:  Syncope, unspecified syncope type  Colitis  Hyponatremia      NEW MEDICATIONS STARTED DURING THIS VISIT:  ED Discharge Orders     None        Note:  This document was prepared using Dragon voice recognition software and may include unintentional dictation errors.    Rudene Re, MD 07/06/21 [phone]

## 2021-07-06 NOTE — ED Triage Notes (Signed)
Pt arrives via [facility] from [facility] where pt was found on the ground in room. Staff reports that it looked like pt slid self down the bed and into the floor. When attempting to get pt up, pt had syncopal episode. EMS was called and EMS reports when assisting pt into a sitting position pt began vomiting a large amount of emesis. Zofran given by EMS through IV.

## 2021-07-06 NOTE — ED Notes (Signed)
Patient transported to CT 

## 2021-07-06 NOTE — H&P (Signed)
History and Physical    [name] [name] KXF:[id number] DOB: 07-30-1924 DOA: 07/06/2021  Referring MD/NP/PA:   PCP: Venia Carbon, MD   Patient coming from:  The patient is coming from ALF  Chief Complaint: Nausea, vomiting, diarrhea and abdominal pain, syncope  HPI: [name] [name] is a 87 y.o. female with medical history significant of hyperlipidemia, GERD, dCHF, skin cancer, osteoporosis, osteoarthritis, who presents with nausea vomiting, diarrhea and abdominal pain.  Per her daughter, her symptoms started since yesterday, including nausea, vomiting, diarrhea and abdominal pain.  She has a few times of nonbilious and nonbloody vomiting.  She has had 2-3 times of watery diarrhea.  Her abdominal pain is located in the middle abdomen, constant, mild to moderate, cramping, nonradiating.  Patient has some shortness breath on exertion only, no cough, chest pain.  No symptoms of UTI.  Patient has chronic back pain per her daughter. Per EMS report, when they try to stand her up she had a syncopal event but was held by them. No injury.   ED Course: pt was found to have WBC 15.2, troponin level 9, 6, negative COVID PCR, negative C. difficile, negative GI pathogen panel, sodium 130, creatinine 0.99, BUN 39, temperature normal, blood pressure 154/63, heart rate 71, RR 22, oxygen saturation 91-95% on room air.  Chest x-ray showed mild cardiomegaly in the low volume.  CT abdomen/pelvis that showed mild descending colitis.  Patient is admitted to [place] bed as inpatient  Review of Systems:   General: no fevers, chills, no body weight gain, has poor appetite, has fatigue HEENT: no blurry vision, hearing changes or sore throat Respiratory: no dyspnea, coughing, wheezing CV: no chest pain, no palpitations GI: has nausea, vomiting, abdominal pain, diarrhea, no constipation GU: no dysuria, burning on urination, increased urinary frequency, hematuria  Ext: no leg edema Neuro: no unilateral  weakness, numbness, or tingling, no vision change or hearing loss. Has syncope Skin: no rash, no skin tear. MSK: No muscle spasm, no deformity, no limitation of range of movement in spin Heme: No easy bruising.  Travel history: No recent long distant travel.  Allergy:  Allergies  Allergen Reactions   Demerol [Meperidine]     dizzy   Shellfish Allergy    Avelox [Moxifloxacin Hcl In Nacl] Rash    Past Medical History:  Diagnosis Date   Allergic rhinitis due to pollen    some shellfish also   Aortic sclerosis 5/12   On echo. no stenosis   Arthritis    Diastolic dysfunction    stress echo otherwise normal 1/04. Repeat 5/11 also negative   History of seasonal allergies    Hx of basal cell carcinoma 1990   multiple sites   Hx of squamous cell carcinoma of skin 2000   R nasolabial fold   Hyperlipidemia    Osteoarthritis, multiple sites    Osteoporosis    Overactive bladder    Urge incontinence    Vaginal odor 04/2018   aerobic vaginitis on One Swab culture    Past Surgical History:  Procedure Laterality Date   CYSTOCELE REPAIR  2007   and rectocele   DILATION AND CURETTAGE OF UTERUS     INGUINAL HERNIA REPAIR  2012   left side with mesh   REVERSE SHOULDER ARTHROPLASTY Right 04/27/2017   Procedure: REVERSE SHOULDER ARTHROPLASTY;  Surgeon: Corky Mull, MD;  Location: [facility] ORS;  Service: Orthopedics;  Laterality: Right;   TONSILLECTOMY AND ADENOIDECTOMY     as child  TOTAL HIP ARTHROPLASTY Right 05/23/2015   Procedure: TOTAL HIP ARTHROPLASTY ANTERIOR APPROACH;  Surgeon: Hessie Knows, MD;  Location: [facility] ORS;  Service: Orthopedics;  Laterality: Right;   UMBILICAL HERNIA REPAIR  02/04/06    Social History:  reports that she has quit smoking. She has never used smokeless tobacco. She reports current alcohol use. She reports that she does not use drugs.  Family History:  Family History  Problem Relation Age of Onset   Cancer Father 36       colon cancer   Heart  disease Father      Prior to Admission medications   Medication Sig Start Date End Date Taking? Authorizing Provider  acetaminophen (TYLENOL) 650 MG CR tablet Take 650 mg by mouth 3 (three) times daily.    [provider]  aspirin EC 81 MG tablet Take 81 mg by mouth daily. Swallow whole.    [provider]  celecoxib (CELEBREX) 200 MG capsule TAKE 1 CAPSULE DAILY 08/12/20   Viviana Simpler I, MD  dorzolamide-timolol (COSOPT) 22.3-6.8 MG/ML ophthalmic solution Apply to eye. 02/27/15   [provider]  EPIPEN 2-PAK 0.3 MG/0.3ML SOAJ injection Inject into the muscle once. 06/08/19   [provider]  fluticasone (FLONASE) 50 MCG/ACT nasal spray Place 1 spray into both nostrils 2 (two) times a day.    [provider]  latanoprost (XALATAN) 0.005 % ophthalmic solution  02/28/18   [provider]  levocetirizine (XYZAL) 5 MG tablet Take 5 mg by mouth every evening.    [provider]  melatonin 3 MG TABS tablet Take 3 mg by mouth at bedtime.    [provider]  Multiple Vitamin (MULTIVITAMIN) tablet Take 1 tablet by mouth daily.    [provider]  MYRBETRIQ 25 MG TB24 tablet Take 1 tablet (25 mg total) by mouth daily. 06/19/21   Jearld Fenton, NP  omeprazole (PRILOSEC) 20 MG capsule Take 1 capsule (20 mg total) by mouth daily. 06/21/19   Jearld Fenton, NP  Probiotic Product (PROBIOTIC ADVANCED PO) Take 1 capsule by mouth daily.    [provider]  Simethicone (GAS-X PO) Take by mouth 2 (two) times daily.    [provider]  traMADol (ULTRAM) 50 MG tablet TAKE ONE TABLET BY MOUTH AT BEDTIME 06/30/21   Jearld Fenton, NP    Physical Exam: Vitals:   07/06/21 1300 07/06/21 1400 07/06/21 1455 07/06/21 1702  BP: 133/71 (!) 134/57 131/71 136/71  Pulse: 94 90 91 95  Resp: 16 15  16   Temp:  98 F (36.7 C) 98.4 F (36.9 C) 98.2 F (36.8 C)  TempSrc:  Oral Oral   SpO2: 95% 93% 94% 96%  Weight:       Height:       General: Not in acute distress.  Dry mucous membrane HEENT:       Eyes: PERRL, EOMI, no scleral icterus.       ENT: No discharge from the ears and nose, no pharynx injection, no tonsillar enlargement.        Neck: No JVD, no bruit, no mass felt. Heme: No neck lymph node enlargement. Cardiac: S1/S2, RRR, No murmurs, No gallops or rubs. Respiratory: No rales, wheezing, rhonchi or rubs. GI: Soft, nondistended, has tenderness in the central abdomen, no rebound pain, no organomegaly, BS present. GU: No hematuria Ext: No pitting leg edema bilaterally. 1+DP/PT pulse bilaterally. Musculoskeletal: No joint deformities, No joint redness or warmth, no limitation of ROM in  spin. Skin: No rashes.  Neuro: Alert, oriented X3, cranial nerves II-XII grossly intact, moves all extremities normally. Psych: Patient is not psychotic, no suicidal or hemocidal ideation.  Labs on Admission: I have personally reviewed following labs and imaging studies  CBC: Recent Labs  Lab 07/06/21 0134  WBC 15.2*  NEUTROABS 8.3*  HGB 11.2*  HCT 34.6*  MCV 103.6*  PLT 390   Basic Metabolic Panel: Recent Labs  Lab 07/06/21 0134  NA 130*  K 4.7  CL 101  CO2 20*  GLUCOSE 126*  BUN 39*  CREATININE 0.99  CALCIUM 8.7*   GFR: Estimated Creatinine Clearance: 34.8 mL/min (by C-G formula based on SCr of 0.99 mg/dL). Liver Function Tests: Recent Labs  Lab 07/06/21 0134  AST 25  ALT 14  ALKPHOS 80  BILITOT 0.9  PROT 6.3*  ALBUMIN 3.6   Recent Labs  Lab 07/06/21 0134  LIPASE 33   No results for input(s): AMMONIA in the last 168 hours. Coagulation Profile: Recent Labs  Lab 07/06/21 1748  INR 1.0   Cardiac Enzymes: No results for input(s): CKTOTAL, CKMB, CKMBINDEX, TROPONINI in the last 168 hours. BNP (last 3 results) No results for input(s): PROBNP in the last 8760 hours. HbA1C: No results for input(s): HGBA1C in the last 72 hours. CBG: No results for input(s): GLUCAP in the last  168 hours. Lipid Profile: No results for input(s): CHOL, HDL, LDLCALC, TRIG, CHOLHDL, LDLDIRECT in the last 72 hours. Thyroid Function Tests: No results for input(s): TSH, T4TOTAL, FREET4, T3FREE, THYROIDAB in the last 72 hours. Anemia Panel: No results for input(s): VITAMINB12, FOLATE, FERRITIN, TIBC, IRON, RETICCTPCT in the last 72 hours. Urine analysis:    Component Value Date/Time   COLORURINE YELLOW 07/06/2021 0538   APPEARANCEUR CLEAR (A) 07/06/2021 0538   LABSPEC 1.015 07/06/2021 0538   PHURINE 5.5 07/06/2021 0538   GLUCOSEU NEGATIVE 07/06/2021 0538   HGBUR MODERATE (A) 07/06/2021 0538   BILIRUBINUR MODERATE (A) 07/06/2021 0538   KETONESUR TRACE (A) 07/06/2021 0538   PROTEINUR TRACE (A) 07/06/2021 0538   NITRITE NEGATIVE 07/06/2021 0538   LEUKOCYTESUR NEGATIVE 07/06/2021 0538   Sepsis Labs: @LABRCNTIP (procalcitonin:4,lacticidven:4) ) Recent Results (from the past 240 hour(s))  Resp Panel by RT-PCR (Flu A&B, Covid) Nasopharyngeal Swab     Status: None   Collection Time: 07/06/21  3:00 AM   Specimen: Nasopharyngeal Swab; Nasopharyngeal(NP) swabs in vial transport medium  Result Value Ref Range Status   SARS Coronavirus 2 by RT PCR NEGATIVE NEGATIVE Final    Comment: (NOTE) SARS-CoV-2 target nucleic acids are NOT DETECTED.  The SARS-CoV-2 RNA is generally detectable in upper respiratory specimens during the acute phase of infection. The lowest concentration of SARS-CoV-2 viral copies this assay can detect is 138 copies/mL. A negative result does not preclude SARS-Cov-2 infection and should not be used as the sole basis for treatment or other patient management decisions. A negative result may occur with  improper specimen collection/handling, submission of specimen other than nasopharyngeal swab, presence of viral mutation(s) within the areas targeted by this assay, and inadequate number of viral copies(<138 copies/mL). A negative result must be combined with clinical  observations, patient history, and epidemiological information. The expected result is Negative.  Fact Sheet for Patients:  EntrepreneurPulse.com.au  Fact Sheet for Healthcare Providers:  IncredibleEmployment.be  This test is no t yet approved or cleared by the [place] FDA and  has been authorized for detection and/or diagnosis of SARS-CoV-2 by FDA under an Emergency Use Authorization (EUA).  This EUA will remain  in effect (meaning this test can be used) for the duration of the COVID-19 declaration under Section 564(b)(1) of the Act, 21 U.S.C.section 360bbb-3(b)(1), unless the authorization is terminated  or revoked sooner.       Influenza A by PCR NEGATIVE NEGATIVE Final   Influenza B by PCR NEGATIVE NEGATIVE Final    Comment: (NOTE) The Xpert Xpress SARS-CoV-2/FLU/RSV plus assay is intended as an aid in the diagnosis of influenza from Nasopharyngeal swab specimens and should not be used as a sole basis for treatment. Nasal washings and aspirates are unacceptable for Xpert Xpress SARS-CoV-2/FLU/RSV testing.  Fact Sheet for Patients: EntrepreneurPulse.com.au  Fact Sheet for Healthcare Providers: IncredibleEmployment.be  This test is not yet approved or cleared by the [place] FDA and has been authorized for detection and/or diagnosis of SARS-CoV-2 by FDA under an Emergency Use Authorization (EUA). This EUA will remain in effect (meaning this test can be used) for the duration of the COVID-19 declaration under Section 564(b)(1) of the Act, 21 U.S.C. section 360bbb-3(b)(1), unless the authorization is terminated or revoked.  Performed at [facility], [place], [place] [address]   C Difficile Quick Screen w PCR reflex     Status: None   Collection Time: 07/06/21  3:41 PM   Specimen: STOOL  Result Value Ref Range Status   C Diff antigen NEGATIVE NEGATIVE Final   C  Diff toxin NEGATIVE NEGATIVE Final   C Diff interpretation No C. difficile detected.  Final    Comment: Performed at [facility], [address] [place], [place] [address]  Gastrointestinal Panel by PCR , Stool     Status: None   Collection Time: 07/06/21  3:41 PM   Specimen: Stool  Result Value Ref Range Status   Campylobacter species NOT DETECTED NOT DETECTED Final   Plesimonas shigelloides NOT DETECTED NOT DETECTED Final   Salmonella species NOT DETECTED NOT DETECTED Final   Yersinia enterocolitica NOT DETECTED NOT DETECTED Final   Vibrio species NOT DETECTED NOT DETECTED Final   Vibrio cholerae NOT DETECTED NOT DETECTED Final   Enteroaggregative E coli (EAEC) NOT DETECTED NOT DETECTED Final   Enteropathogenic E coli (EPEC) NOT DETECTED NOT DETECTED Final   Enterotoxigenic E coli (ETEC) NOT DETECTED NOT DETECTED Final   Shiga like toxin producing E coli (STEC) NOT DETECTED NOT DETECTED Final   Shigella/Enteroinvasive E coli (EIEC) NOT DETECTED NOT DETECTED Final   Cryptosporidium NOT DETECTED NOT DETECTED Final   Cyclospora cayetanensis NOT DETECTED NOT DETECTED Final   Entamoeba histolytica NOT DETECTED NOT DETECTED Final   Giardia lamblia NOT DETECTED NOT DETECTED Final   Adenovirus F40/41 NOT DETECTED NOT DETECTED Final   Astrovirus NOT DETECTED NOT DETECTED Final   Norovirus GI/GII NOT DETECTED NOT DETECTED Final   Rotavirus A NOT DETECTED NOT DETECTED Final   Sapovirus (I, II, IV, and V) NOT DETECTED NOT DETECTED Final    Comment: Performed at [facility], [address] [place], [place] [address]     Radiological Exams on Admission: CT ABDOMEN PELVIS W CONTRAST  Result Date: 07/06/2021 CLINICAL DATA:  Acute nonlocalized abdominal pain.  Found down. EXAM: CT ABDOMEN AND PELVIS WITH CONTRAST TECHNIQUE: Multidetector CT imaging of the abdomen and pelvis was performed using the standard protocol following bolus administration of intravenous contrast.  CONTRAST:  75mL OMNIPAQUE IOHEXOL 300 MG/ML  SOLN COMPARISON:  03/29/2020 FINDINGS: LOWER CHEST: Bibasilar atelectasis. HEPATOBILIARY: Normal hepatic contours. No intra- or  extrahepatic biliary dilatation. Distended gallbladder. PANCREAS: Normal pancreas. No ductal dilatation or peripancreatic fluid collection. SPLEEN: Normal. ADRENALS/URINARY TRACT: The adrenal glands are normal. No hydronephrosis, nephroureterolithiasis or solid renal mass. The urinary bladder is normal for degree of distention STOMACH/BOWEL: Small sliding hiatal hernia. Normal duodenal course and caliber. No small bowel dilatation or inflammation. Large amount of stool throughout the colon. Wall thickening of the proximal descending colon. VASCULAR/LYMPHATIC: There is calcific atherosclerosis of the abdominal aorta. No lymphadenopathy. REPRODUCTIVE: Normal uterus. No adnexal mass. MUSCULOSKELETAL. No bony spinal canal stenosis or focal osseous abnormality. OTHER: Small left lower quadrant abdominal hernia that contains small bowel without evidence of obstruction. IMPRESSION: 1. Wall thickening of the proximal descending colon may indicate mild colitis. 2. Large amount of stool throughout the colon. 3. Small sliding hiatal hernia. Aortic Atherosclerosis (ICD10-I70.0). Electronically Signed   By: Ulyses Jarred M.D.   On: 07/06/2021 03:21   DG Chest Portable 1 View  Result Date: 07/06/2021 CLINICAL DATA:  87 year old female with hypoxia.  Syncope. EXAM: PORTABLE CHEST 1 VIEW COMPARISON:  CT Abdomen and Pelvis 0243 hours today. FINDINGS: Portable AP upright view at 0608 hours. Stable somewhat low lung volumes. Stable cardiac size and mediastinal contours. Mild cardiomegaly. Mild bibasilar atelectasis as demonstrated by CT. No pneumothorax, pulmonary edema or consolidation. Right shoulder arthroplasty. No acute osseous abnormality identified. Negative visible bowel gas. IMPRESSION: Mild cardiomegaly and low lung volumes with basilar atelectasis.  Electronically Signed   By: Genevie Ann M.D.   On: 07/06/2021 06:32     EKG: I have personally reviewed.  Sinus rhythm, QTC 490, LAD, left bundle blockage with old, poor R wave progression  Assessment/Plan Principal Problem:   Acute colitis Active Problems:   Chronic diastolic heart failure (HCC)   GERD (gastroesophageal reflux disease)   Sepsis ([place])   Syncope   Hyponatremia   Sepsis due to acute colitis: Patient meets criteria for sepsis with WBC 15.2, tachypnea with RR 22.  Currently hemodynamically stable.  Lactic acid is normal 1.2.  -Admitted to MedSurg bed as inpatient -IV Zosyn -Follow-up of blood culture -As needed morphine for pain, Zofran for nausea --will get Procalcitonin -IVF: 1L of NS bolus   Chronic diastolic heart failure ([place]): 2D echo on 12/03/2010 which showed EF of 55 to 60%.  Patient does not have leg edema or JVD.  CHF seem to be compensated.  Patient is not taking diuretics currently -Check BNP  GERD (gastroesophageal reflux disease) -Protonix  Syncope: Likely due to volume depletion and orthostatic status.  No focal neurodeficit on physical examination.  No injury per report. -Frequent neuro check -Check orthostatic vital sign -IV fluid as above -PT/OT  Hyponatremia: Sodium 130 -IV fluid as above      DVT ppx:  SQ Lovenox Code Status: Full code Family Communication: Yes, patient's daughter at bed side.   Disposition Plan:  Anticipate discharge back to previous environment, assisted living facility Consults called: None Admission status and Level of care: Telemetry Medical:     as inpt       Status is: Inpatient  Remains inpatient appropriate because: Patient has multiple comorbidities, now presents with acute colitis and sepsis.  Patient also has syncope. Given her older age, patient is at high risk of deteriorating.  Will need to be treated in hospital for at least 2 days.          Date of Service 07/06/2021    Ivor Costa [facility]   If 7PM-7AM, please contact night-coverage  [email or web address] 07/06/2021, 6:30 PM

## 2021-07-07 LAB — BASIC METABOLIC PANEL
Anion gap: 6 (ref 5–15)
BUN: 31 mg/dL — ABNORMAL HIGH (ref 8–23)
CO2: 23 mmol/L (ref 22–32)
Calcium: 8 mg/dL — ABNORMAL LOW (ref 8.9–10.3)
Chloride: 106 mmol/L (ref 98–111)
Creatinine, Ser: 0.92 mg/dL (ref 0.44–1.00)
GFR, Estimated: 57 mL/min — ABNORMAL LOW (ref >60)
Glucose, Bld: 99 mg/dL (ref 70–99)
Potassium: 4 mmol/L (ref 3.5–5.1)
Sodium: 135 mmol/L (ref 135–145)

## 2021-07-07 LAB — CBC
HCT: 29.2 % — ABNORMAL LOW (ref 36.0–46.0)
Hemoglobin: 9.9 g/dL — ABNORMAL LOW (ref 12.0–15.0)
MCH: 33.8 pg (ref 26.0–34.0)
MCHC: 33.9 g/dL (ref 30.0–36.0)
MCV: 99.7 fL (ref 80.0–100.0)
Platelets: 254 10*3/uL (ref 150–400)
RBC: 2.93 MIL/uL — ABNORMAL LOW (ref 3.87–5.11)
RDW: 14.1 % (ref 11.5–15.5)
WBC: 12.1 10*3/uL — ABNORMAL HIGH (ref 4.0–10.5)
nRBC: 0 % (ref 0.0–0.2)

## 2021-07-07 MED ORDER — POLYETHYLENE GLYCOL 3350 17 G PO PACK
17.0000 g | PACK | Freq: Two times a day (BID) | ORAL | Status: DC
  Administered 2021-07-07 (×2): 17 g via ORAL
  Filled 2021-07-07 (×3): qty 1

## 2021-07-07 MED ORDER — AMOXICILLIN-POT CLAVULANATE 875-125 MG PO TABS
1.0000 | ORAL_TABLET | Freq: Two times a day (BID) | ORAL | Status: DC
  Administered 2021-07-07 – 2021-07-08 (×2): 1 via ORAL
  Filled 2021-07-07 (×2): qty 1

## 2021-07-07 MED ORDER — SODIUM CHLORIDE 0.9 % IV SOLN
3.0000 g | Freq: Four times a day (QID) | INTRAVENOUS | Status: DC
  Administered 2021-07-07: 13:00:00 3 g via INTRAVENOUS
  Filled 2021-07-07 (×2): qty 8
  Filled 2021-07-07: qty 3

## 2021-07-07 NOTE — NC FL2 (Signed)
[place] LEVEL OF CARE SCREENING TOOL     IDENTIFICATION  Patient Name: MEMORY HEINRICHS Birthdate: [birth year] Sex: female Admission Date (Current Location): 07/06/2021  [facility] and [place] Number:  Engineering geologist and Address:  [facility], [address] [place], [place] [address]      Provider Number: [id number]  Attending Physician Name and Address:  Fritzi Mandes, MD  Relative Name and Phone Number:  Robinette Haines (Daughter)   [phone]    Current Level of Care: Hospital Recommended Level of Care: [place] Prior Approval Number:    Date Approved/Denied:   PASRR Number:    Discharge Plan: Other (Comment) (ALF [place])    Current Diagnoses: Patient Active Problem List   Diagnosis Date Noted   Acute colitis 07/06/2021   Sepsis ([place]) 07/06/2021   Syncope 07/06/2021   Hyponatremia 07/06/2021   Peripheral venous insufficiency 04/17/2021   Atherosclerosis of aorta ([place]) 04/25/2020   Insomnia 01/27/2020   GERD (gastroesophageal reflux disease) 01/27/2020   Chronic constipation 08/18/2018   Allergic rhinitis due to pollen    Osteoporosis    Osteoarthritis, multiple sites    Urge incontinence    Chronic diastolic heart failure ([place])     Orientation RESPIRATION BLADDER Height & Weight        Normal Continent Weight: 73.1 kg Height:  5\' 6"  (167.6 cm)  BEHAVIORAL SYMPTOMS/MOOD NEUROLOGICAL BOWEL NUTRITION STATUS      Continent Diet  AMBULATORY STATUS COMMUNICATION OF NEEDS Skin   Limited Assist Verbally Normal                       Personal Care Assistance Level of Assistance  Bathing, Feeding, Dressing Bathing Assistance: Limited assistance Feeding assistance: Limited assistance Dressing Assistance: Limited assistance     Functional Limitations Info  Sight, Hearing, Speech Sight Info: Adequate Hearing Info: Impaired (hard of hearing) Speech Info: Adequate    SPECIAL CARE FACTORS  FREQUENCY  PT (By licensed PT), OT (By licensed OT)     PT Frequency: 5X WEEK OT Frequency: 5X WEEK            Contractures Contractures Info: Not present    Additional Factors Info  Code Status, Allergies Code Status Info: Full Allergies Info: Demerol, Shellfish, Avelox           Current Medications (07/07/2021):  This is the current hospital active medication list Current Facility-Administered Medications  Medication Dose Route Frequency Provider Last Rate Last Admin   acetaminophen (TYLENOL) tablet 650 mg  650 mg Oral Q6H PRN Ivor Costa, MD   650 mg at 07/06/21 2122   acidophilus (RISAQUAD) capsule 1 capsule  1 capsule Oral Daily Ivor Costa, MD   1 capsule at 07/07/21 0931   Ampicillin-Sulbactam (UNASYN) 3 g in sodium chloride 0.9 % 100 mL IVPB  3 g Intravenous Q6H Fritzi Mandes, MD 200 mL/hr at 07/07/21 1309 3 g at 07/07/21 1309   aspirin EC tablet 81 mg  81 mg Oral Daily Ivor Costa, MD   81 mg at 07/07/21 0931   dorzolamide-timolol (COSOPT) 22.3-6.8 MG/ML ophthalmic solution 1 drop  1 drop Both Eyes BID Ivor Costa, MD   1 drop at 07/07/21 0932   enoxaparin (LOVENOX) injection 40 mg  40 mg Subcutaneous Q24H Ivor Costa, MD   40 mg at 07/07/21 0931   fluticasone (FLONASE) 50 MCG/ACT nasal spray 1 spray  1 spray Each Nare Daily Ivor Costa, MD  1 spray at 07/07/21 0932   hydrALAZINE (APRESOLINE) injection 5 mg  5 mg Intravenous Q2H PRN Ivor Costa, MD       latanoprost (XALATAN) 0.005 % ophthalmic solution 1 drop  1 drop Both Eyes QHS Ivor Costa, MD   1 drop at 07/06/21 2122   loratadine (CLARITIN) tablet 10 mg  10 mg Oral QPM Ivor Costa, MD   10 mg at 07/06/21 1702   melatonin tablet 5 mg  5 mg Oral QHS Ivor Costa, MD   5 mg at 07/06/21 2123   mirabegron ER (MYRBETRIQ) tablet 25 mg  25 mg Oral Daily Ivor Costa, MD   25 mg at 07/07/21 0931   morphine 2 MG/ML injection 0.5 mg  0.5 mg Intravenous Q4H PRN Ivor Costa, MD       multivitamin with minerals tablet 1 tablet  1 tablet Oral  Daily Ivor Costa, MD   1 tablet at 07/07/21 0931   ondansetron (ZOFRAN) injection 4 mg  4 mg Intravenous Q8H PRN Ivor Costa, MD       pantoprazole (PROTONIX) EC tablet 40 mg  40 mg Oral Daily Ivor Costa, MD   40 mg at 07/07/21 0931   polyethylene glycol (MIRALAX / GLYCOLAX) packet 17 g  17 g Oral BID Fritzi Mandes, MD   17 g at 07/07/21 0931   simethicone (MYLICON) chewable tablet 80 mg  80 mg Oral BID Ivor Costa, MD   80 mg at 07/07/21 0931   traMADol (ULTRAM) tablet 50 mg  50 mg Oral QPM Ivor Costa, MD   50 mg at 07/06/21 1702     Discharge Medications: Please see discharge summary for a list of discharge medications.  Relevant Imaging Results:  Relevant Lab Results:   Additional Information SS# [id number]  Kerin Salen, RN

## 2021-07-07 NOTE — Evaluation (Signed)
Physical Therapy Evaluation Patient Details Name: [name] [name] MRN: [id number] DOB: 21-Nov-1923 Today's Date: 07/07/2021  History of Present Illness  Patient is a 87 year old female coming from ALF with nausea, vomiting, diarrhea, abdominal pain, and syncope. Found to have sepsis due to acute colitis, hyponatremia. Medical history significant of hyperlipidemia, GERD, dCHF, skin cancer, osteoporosis, osteoarthritis  Clinical Impression  Patient is agreeable to PT evaluation. Daugther at the bedside during session. Orthostatic vitals taken (see floweheet) without decrease in blood pressure. No dizziness is reported during activity. Patient required minimal assistance for bed mobility. Min guard for standing and ambulation in hallway using rolling walker. Patient needs occasional cues for safety during mobility but overall is doing well. She was mildly short of breath after walking with Sp02 95% on room air. Recommend PT follow up to maximize independence and facilitate return to prior level of function. Anticipate patient will be able to return to her ALF with home health PT.      Recommendations for follow up therapy are one component of a multi-disciplinary discharge planning process, led by the attending physician.  Recommendations may be updated based on patient status, additional functional criteria and insurance authorization.  Follow Up Recommendations Home health PT    Assistance Recommended at Discharge Intermittent Supervision/Assistance  Functional Status Assessment Patient has had a recent decline in their functional status and demonstrates the ability to make significant improvements in function in a reasonable and predictable amount of time.  Equipment Recommendations  None recommended by PT    Recommendations for Other Services       Precautions / Restrictions Precautions Precautions: Fall Restrictions Weight Bearing Restrictions: No      Mobility  Bed  Mobility Overal bed mobility: Needs Assistance Bed Mobility: Supine to Sit;Sit to Supine     Supine to sit: Min assist Sit to supine: Min assist   General bed mobility comments: assistance for trunk support to sit upright and assistance for LE support to return to bed    Transfers Overall transfer level: Needs assistance Equipment used: Rolling walker (2 wheels) Transfers: Sit to/from Stand Sit to Stand: Min guard           General transfer comment: verbal cues for hand placement for safety    Ambulation/Gait Ambulation/Gait assistance: Min guard Gait Distance (Feet): 75 Feet Assistive device: Rolling walker (2 wheels) Gait Pattern/deviations: Step-through pattern;Trunk flexed Gait velocity: decreased     General Gait Details: verbal cues for technique using 2 wheeled walker. no loss of balance noted. Min guard for safety. patient was mildly short of breath after walking with Sp02 95% on room air and heart rate in the 60's.  Stairs            Wheelchair Mobility    Modified Rankin (Stroke Patients Only)       Balance Overall balance assessment: Needs assistance Sitting-balance support: Feet supported Sitting balance-Leahy Scale: Good     Standing balance support: During functional activity;Reliant on assistive device for balance Standing balance-Leahy Scale: Fair Standing balance comment: rolling walker for UE support in standing. no loss of balance noted                             Pertinent Vitals/Pain Pain Assessment: No/denies pain    Home Living Family/patient expects to be discharged to:: Assisted living                 Home Equipment:  Rollator (4 wheels);Grab bars - toilet Additional Comments: has assistance with meals and medication at ALF    Prior Function Prior Level of Function : History of Falls (last six months);Independent/Modified Independent             Mobility Comments: using rollator, she can ambulate to  the dining room at ALF ADLs Comments: patient is Mod I with ADLs per daughter. she has assistance for an hour in the morning at ALF for whatever she might need around the home     Hand Dominance   Dominant Hand: Right    Extremity/Trunk Assessment   Upper Extremity Assessment Upper Extremity Assessment: Overall WFL for tasks assessed    Lower Extremity Assessment Lower Extremity Assessment: Generalized weakness       Communication   Communication: HOH  Cognition Arousal/Alertness: Awake/alert Behavior During Therapy: WFL for tasks assessed/performed Overall Cognitive Status: Within Functional Limits for tasks assessed                                 General Comments: patient able to follow all commands with extra time        General Comments      Exercises     Assessment/Plan    PT Assessment Patient needs continued PT services  PT Problem List Decreased strength;Decreased activity tolerance;Decreased balance;Decreased mobility;Decreased safety awareness       PT Treatment Interventions DME instruction;Functional mobility training;Gait training;Therapeutic activities;Therapeutic exercise;Balance training;Neuromuscular re-education;Cognitive remediation;Patient/family education    PT Goals (Current goals can be found in the Care Plan section)  Acute Rehab PT Goals Patient Stated Goal: to return to ALF PT Goal Formulation: With patient/family Time For Goal Achievement: 07/21/21 Potential to Achieve Goals: Good    Frequency Min 2X/week   Barriers to discharge        Co-evaluation               AM-PAC PT "6 Clicks" Mobility  Outcome Measure Help needed turning from your back to your side while in a flat bed without using bedrails?: None Help needed moving from lying on your back to sitting on the side of a flat bed without using bedrails?: A Little Help needed moving to and from a bed to a chair (including a wheelchair)?: A Little Help  needed standing up from a chair using your arms (e.g., wheelchair or bedside chair)?: A Little Help needed to walk in hospital room?: A Little Help needed climbing 3-5 steps with a railing? : A Lot 6 Click Score: 18    End of Session Equipment Utilized During Treatment: Gait belt Activity Tolerance: Patient tolerated treatment well Patient left: in bed;with call bell/phone within reach;with bed alarm set;with family/visitor present (daughter present during session) Nurse Communication: Mobility status PT Visit Diagnosis: Unsteadiness on feet (R26.81);Muscle weakness (generalized) (M62.81)    Time: 2992-4268 PT Time Calculation (min) (ACUTE ONLY): 36 min   Charges:   PT Evaluation $PT Eval Low Complexity: 1 Low PT Treatments $Therapeutic Activity: 8-22 mins      Minna Merritts, PT, MPT   Percell Locus 07/07/2021, 12:30 PM

## 2021-07-07 NOTE — Progress Notes (Signed)
[facility] NAME: [name] [name]    MR#:  [id number]  DATE OF BIRTH:  June 16, 1924  SUBJECTIVE:  patient has history of constipation. Came in with not feeling well for last few days. Started having nausea vomiting. Found to be constipated with workup in the ER including acute mild colitis. Patient much alert and feeling better. Daughter at bedside. No vomiting. Tolerating PO diet.  REVIEW OF SYSTEMS:   Review of Systems  Constitutional:  Negative for chills, fever and weight loss.  HENT:  Negative for ear discharge, ear pain and nosebleeds.   Eyes:  Negative for blurred vision, pain and discharge.  Respiratory:  Negative for sputum production, shortness of breath, wheezing and stridor.   Cardiovascular:  Negative for chest pain, palpitations, orthopnea and PND.  Gastrointestinal:  Negative for abdominal pain, diarrhea, nausea and vomiting.  Genitourinary:  Negative for frequency and urgency.  Musculoskeletal:  Negative for back pain and joint pain.  Neurological:  Positive for weakness. Negative for sensory change, speech change and focal weakness.  Psychiatric/Behavioral:  Negative for depression and hallucinations. The patient is not nervous/anxious.   Tolerating Diet:yes Tolerating PT: HH  DRUG ALLERGIES:   Allergies  Allergen Reactions   Demerol [Meperidine]     dizzy   Shellfish Allergy    Avelox [Moxifloxacin Hcl In Nacl] Rash    VITALS:  Blood pressure (!) 134/58, pulse 81, temperature 98.2 F (36.8 C), temperature source Oral, resp. rate 17, height 5\' 6"  (1.676 m), weight 73.1 kg, SpO2 95 %.  PHYSICAL EXAMINATION:   Physical Exam  GENERAL:  87 y.o.-year-old patient lying in the bed with no acute distress. Obese HEENT: Head atraumatic, normocephalic. Oropharynx and nasopharynx clear.  LUNGS: Normal breath sounds bilaterally, no wheezing, rales, rhonchi. No use of accessory muscles of respiration.  CARDIOVASCULAR:  S1, S2 normal. No murmurs, rubs, or gallops.  ABDOMEN: Soft, nontender, nondistended. Bowel sounds present. No organomegaly or mass.  EXTREMITIES: No cyanosis, clubbing or edema b/l.    NEUROLOGIC: nonfocal PSYCHIATRIC:  patient is alert and oriented x 3.  SKIN: No obvious rash, lesion, or ulcer.   LABORATORY PANEL:  CBC Recent Labs  Lab 07/07/21 0427  WBC 12.1*  HGB 9.9*  HCT 29.2*  PLT 254    Chemistries  Recent Labs  Lab 07/06/21 0134 07/07/21 0427  NA 130* 135  K 4.7 4.0  CL 101 106  CO2 20* 23  GLUCOSE 126* 99  BUN 39* 31*  CREATININE 0.99 0.92  CALCIUM 8.7* 8.0*  AST 25  --   ALT 14  --   ALKPHOS 80  --   BILITOT 0.9  --    Cardiac Enzymes No results for input(s): TROPONINI in the last 168 hours. RADIOLOGY:  CT ABDOMEN PELVIS W CONTRAST  Result Date: 07/06/2021 CLINICAL DATA:  Acute nonlocalized abdominal pain.  Found down. EXAM: CT ABDOMEN AND PELVIS WITH CONTRAST TECHNIQUE: Multidetector CT imaging of the abdomen and pelvis was performed using the standard protocol following bolus administration of intravenous contrast. CONTRAST:  53mL OMNIPAQUE IOHEXOL 300 MG/ML  SOLN COMPARISON:  03/29/2020 FINDINGS: LOWER CHEST: Bibasilar atelectasis. HEPATOBILIARY: Normal hepatic contours. No intra- or extrahepatic biliary dilatation. Distended gallbladder. PANCREAS: Normal pancreas. No ductal dilatation or peripancreatic fluid collection. SPLEEN: Normal. ADRENALS/URINARY TRACT: The adrenal glands are normal. No hydronephrosis, nephroureterolithiasis or solid renal mass. The urinary bladder is normal for degree of distention STOMACH/BOWEL: Small sliding hiatal hernia. Normal duodenal course and caliber.  No small bowel dilatation or inflammation. Large amount of stool throughout the colon. Wall thickening of the proximal descending colon. VASCULAR/LYMPHATIC: There is calcific atherosclerosis of the abdominal aorta. No lymphadenopathy. REPRODUCTIVE: Normal uterus. No adnexal mass.  MUSCULOSKELETAL. No bony spinal canal stenosis or focal osseous abnormality. OTHER: Small left lower quadrant abdominal hernia that contains small bowel without evidence of obstruction. IMPRESSION: 1. Wall thickening of the proximal descending colon may indicate mild colitis. 2. Large amount of stool throughout the colon. 3. Small sliding hiatal hernia. Aortic Atherosclerosis (ICD10-I70.0). Electronically Signed   By: Ulyses Jarred M.D.   On: 07/06/2021 03:21   DG Chest Portable 1 View  Result Date: 07/06/2021 CLINICAL DATA:  87 year old female with hypoxia.  Syncope. EXAM: PORTABLE CHEST 1 VIEW COMPARISON:  CT Abdomen and Pelvis 0243 hours today. FINDINGS: Portable AP upright view at 0608 hours. Stable somewhat low lung volumes. Stable cardiac size and mediastinal contours. Mild cardiomegaly. Mild bibasilar atelectasis as demonstrated by CT. No pneumothorax, pulmonary edema or consolidation. Right shoulder arthroplasty. No acute osseous abnormality identified. Negative visible bowel gas. IMPRESSION: Mild cardiomegaly and low lung volumes with basilar atelectasis. Electronically Signed   By: Genevie Ann M.D.   On: 07/06/2021 06:32   ASSESSMENT AND PLAN:  [name] [name] is a 87 y.o. female with medical history significant of hyperlipidemia, GERD, dCHF, skin cancer, osteoporosis, osteoarthritis, who presents with nausea vomiting, diarrhea and abdominal pain.  Per her daughter, her symptoms started since yesterday, including nausea, vomiting, diarrhea and abdominal pain.    Sepsis due to acute colitis: Patient meets criteria for sepsis with WBC 15.2, tachypnea with RR 22.  Currently hemodynamically stable.  Lactic acid is normal 1.2.  --IV Zosyn -blood culture no growth -As needed morphine for pain, Zofran for nausea --Procalcitonin 0.86 -received IVF --sepsis improved    Chronic diastolic heart failure ([place]):  --2D echo on 12/03/2010 which showed EF of 55 to 60%.  Patient does not have leg edema or  JVD.  CHF seem to be compensated.  Patient is not taking diuretics currently -BNP-- 85   GERD (gastroesophageal reflux disease) -Protonix   Syncope: Likely due to volume depletion and orthostatic status.  No focal neurodeficit on physical examination.  No injury per report. --PT/OT--recommends HH -- patient near baseline. Hemodynamically improving.   Hyponatremia: Sodium 130--135 -IV fluid as above -- suspected dehydration from G.I. loss      Procedures: Family communication : daughter in the room Consults : none CODE STATUS: full DVT Prophylaxis : enoxaparin Level of care: Telemetry Medical Status is: Inpatient  Remains inpatient appropriate because: acute colitis   anticipate discharge tomorrow to [facility]. Daughter and patient in agreement     TOTAL TIME TAKING CARE OF THIS PATIENT: 25 minutes.  >50% time spent on counselling and coordination of care  Note: This dictation was prepared with Dragon dictation along with smaller phrase technology. Any transcriptional errors that result from this process are unintentional.  Fritzi Mandes M.D    [facility]   CC: Primary care physician; Venia Carbon, MD Patient ID: [name] [name], female   DOB: 1923-10-12, 87 y.o.   MRN: [id number]

## 2021-07-07 NOTE — TOC Initial Note (Signed)
Transition of Care [facility]) - Initial/Assessment Note    Patient Details  Name: [name] [name] MRN: [id number] Date of Birth: 1923/08/12  [facility]) CM/SW Contact:    Kerin Salen, RN Phone Number: 07/07/2021, 1:18 PM  Clinical Narrative: Geanie Logan spoke with [place], [phone] who indicated that patient lives in ALF can return when medically stable. Call report to [phone] ask to speak to Luellen Pucker and also let Luellen Pucker know if transportation is needed if family is not able to provide it. TOC to continue to track for discharge needs.            Expected Discharge Plan: Assisted Living [facility]) Barriers to Discharge: Continued Medical Work up   Patient Goals and CMS Choice Patient states their goals for this hospitalization and ongoing recovery are:: To return to [place]      Expected Discharge Plan and Services Expected Discharge Plan: Assisted Living [facility])       Living arrangements for the past 2 months: Assisted Living Facility                           HH Arranged: PT, OT [place] Agency: Other - See comment (To be arranged by [facility])        Prior Living Arrangements/Services Living arrangements for the past 2 months: [place] Lives with:: Facility Resident          Need for Family Participation in Patient Care: Yes (Comment) Care giver support system in place?: Yes (comment)   Criminal Activity/Legal Involvement Pertinent to Current Situation/Hospitalization: No - Comment as needed  Activities of Daily Living Home Assistive Devices/Equipment: Hearing aid, Walker (specify type) (walker with chair) ADL Screening (condition at time of admission) Patient's cognitive ability adequate to safely complete daily activities?: Yes Is the patient deaf or have difficulty hearing?: Yes Does the patient have difficulty seeing, even when wearing glasses/contacts?: No Does the patient have difficulty  concentrating, remembering, or making decisions?: No Patient able to express need for assistance with ADLs?: Yes Does the patient have difficulty dressing or bathing?: Yes Independently performs ADLs?: No Communication: Independent Dressing (OT): Needs assistance Is this a change from baseline?: Pre-admission baseline Grooming: Independent Feeding: Independent Bathing: Needs assistance Is this a change from baseline?: Pre-admission baseline Toileting: Needs assistance Is this a change from baseline?: Pre-admission baseline In/Out Bed: Needs assistance Is this a change from baseline?: Pre-admission baseline Walks in Home: Independent with device (comment) (walker) Does the patient have difficulty walking or climbing stairs?: Yes Weakness of Legs: Both Weakness of Arms/Hands: Both  Permission Sought/Granted                  Emotional Assessment Appearance:: Appears stated age       Alcohol / Substance Use: Not Applicable Psych Involvement: No (comment)  Admission diagnosis:  Colitis [K52.9] Hyponatremia [E87.1] Acute colitis [K52.9] Syncope, unspecified syncope type [R55] Patient Active Problem List   Diagnosis Date Noted   Acute colitis 07/06/2021   Sepsis ([place]) 07/06/2021   Syncope 07/06/2021   Hyponatremia 07/06/2021   Peripheral venous insufficiency 04/17/2021   Atherosclerosis of aorta ([place]) 04/25/2020   Insomnia 01/27/2020   GERD (gastroesophageal reflux disease) 01/27/2020   Chronic constipation 08/18/2018   Allergic rhinitis due to pollen    Osteoporosis    Osteoarthritis, multiple sites    Urge incontinence    Chronic diastolic heart failure ([place])  PCP:  Venia Carbon, MD Pharmacy:   [place], [place] [address] [place] [address] Phone: [phone] Fax: [phone]  [id number] - [place], [place] - [place] AT [place] [address] [place] [address] Phone: [phone] Fax: [phone]  [place], [place] - [place] [address] Phone: [phone] Fax: [phone]     Social Determinants of Health (SDOH) Interventions    Readmission Risk Interventions No flowsheet data found.

## 2021-07-07 NOTE — Progress Notes (Addendum)
Nutrition Brief Note  Patient identified on the Malnutrition Screening Tool (MST) Report  Wt Readings from Last 15 Encounters:  07/06/21 73.1 kg  04/17/21 70.6 kg  01/29/21 68.9 kg  10/24/20 68.9 kg  08/01/20 70.6 kg  07/25/20 70.7 kg  01/27/20 72.3 kg  10/26/19 71.7 kg  10/24/19 72.6 kg  10/09/19 73 kg  06/21/19 70.7 kg  02/23/19 69.2 kg  09/30/18 66.2 kg  08/18/18 64.4 kg  06/06/18 61.7 kg   [name] [name] is a 87 y.o. female with medical history significant of hyperlipidemia, GERD, dCHF, skin cancer, osteoporosis, osteoarthritis, who presents with nausea vomiting, diarrhea and abdominal pain.  Pt admitted with sepsis secondary to acute colitis.   Reviewed I/O's: +73 ml x 24 hours  UOP: 500 ml x 24 hours   Spoke with pt and daughter at bedside. Pt daughter reports decreased oral intake in the past 1-2 months, which she suspects was related to dehydration and constipation. She typically consumes 3 meals per day in the dining room at [facility]. Pt reports she didn't eat much of her tray today; noted meal completion 50-100%. She denies any difficulty swallowing.   Per daughter, UBW is around 140#. Pt had gained weight initially due to eating more sweets in the dining area and got up to 160#, but has slowly lost some of that weight.   Nutrition-Focused physical exam completed. Findings are mild fat depletion, mild muscle depletion, and no edema. Suspect muscle depletions are related to advanced age. Pt reports that she feels more "lazy" than normal and has not been as active lately.     Plan to d/c back to SNF tomorrow.  Medications reviewed and include melatonin and miralax.   Labs reviewed.   Current diet order is dysphagia 3 diet, patient is consuming approximately 50-100% of meals at this time. Labs and medications reviewed.   No nutrition interventions warranted at this time. If nutrition issues arise, please consult RD.   Loistine Chance, RD, LDN, [place] Registered Dietitian  II Certified Diabetes Care and Education Specialist Please refer to [facility] for RD and/or RD on-call/weekend/after hours pager

## 2021-07-07 NOTE — Evaluation (Signed)
Occupational Therapy Evaluation Patient Details Name: [name] [name] MRN: [id number] DOB: 1924/02/15 Today's Date: 07/07/2021   History of Present Illness Patient is a 87 year old female coming from ALF with nausea, vomiting, diarrhea, abdominal pain, and syncope. Found to have sepsis due to acute colitis, hyponatremia. Medical history significant of hyperlipidemia, GERD, dCHF, skin cancer, osteoporosis, osteoarthritis   Clinical Impression   Pt seen for OT evaluation this date. Upon arrival to room, pt awake and seated upright in bed with daughter present. Prior to admission, pt was residing in an ALF, independently performing toileting and grooming, receiving MIN A for dressing and bathing, and using a rollator for functional mobility. Pt currently presents with decreased balance, strength, and activity tolerance. Due to these functional impairments, pt requires SUPERVISION for functional mobility of short household distances with RW, MIN GUARD for toilet transfers, MIN A for peri-care, and SUPERVISION for standing grooming tasks. Pt denying dizziness throughout. Pt would benefit from additional skilled OT services to maximize return to PLOF and minimize risk of future falls, injury, caregiver burden, and readmission. Upon discharge, recommend [facility] services.          Recommendations for follow up therapy are one component of a multi-disciplinary discharge planning process, led by the attending physician.  Recommendations may be updated based on patient status, additional functional criteria and insurance authorization.   Follow Up Recommendations  Home health OT    Assistance Recommended at Discharge Intermittent Supervision/Assistance  Functional Status Assessment  Patient has had a recent decline in their functional status and demonstrates the ability to make significant improvements in function in a reasonable and predictable amount of time.  Equipment Recommendations  None recommended  by OT       Precautions / Restrictions Precautions Precautions: Fall Restrictions Weight Bearing Restrictions: No      Mobility Bed Mobility Overal bed mobility: Needs Assistance Bed Mobility: Supine to Sit     Supine to sit: Min guard;HOB elevated Sit to supine: Min assist   General bed mobility comments: Required only increase time/effort    Transfers Overall transfer level: Needs assistance Equipment used: Rolling walker (2 wheels) Transfers: Sit to/from Stand Sit to Stand: Min guard           General transfer comment: verbal cues for hand placement for safety      Balance Overall balance assessment: Needs assistance Sitting-balance support: No upper extremity supported;Feet supported Sitting balance-Leahy Scale: Good Sitting balance - Comments: Good sitting balance reaching within BOS   Standing balance support: No upper extremity supported;During functional activity Standing balance-Leahy Scale: Fair Standing balance comment: Requires supervision for standing hand hygiene                           ADL either performed or assessed with clinical judgement   ADL Overall ADL's : Needs assistance/impaired     Grooming: Wash/dry hands;Supervision/safety;Standing               Lower Body Dressing: Min guard;Sitting/lateral leans Lower Body Dressing Details (indicate cue type and reason): to don socks Toilet Transfer: Min guard;Regular Toilet;Grab bars;Rolling walker (2 wheels) Toilet Transfer Details (indicate cue type and reason): MIN GUARD d/t low toilet height (pt reporting toilet at home is taller). Requires verbal cues for using grab bars to steady Toileting- Clothing Manipulation and Hygiene: Minimal assistance;Sit to/from stand Toileting - Clothing Manipulation Details (indicate cue type and reason): Requires MIN A for thoroughness  Functional mobility during ADLs: Supervision/safety;Rolling walker (2 wheels)       Vision  Ability to See in Adequate Light: 0 Adequate Patient Visual Report: No change from baseline              Pertinent Vitals/Pain Pain Assessment: No/denies pain     Hand Dominance Right   Extremity/Trunk Assessment Upper Extremity Assessment Upper Extremity Assessment: Generalized weakness (grossly 3+/5 in all movements)   Lower Extremity Assessment Lower Extremity Assessment: Generalized weakness       Communication Communication Communication: HOH   Cognition Arousal/Alertness: Awake/alert Behavior During Therapy: WFL for tasks assessed/performed Overall Cognitive Status: Within Functional Limits for tasks assessed                                 General Comments: patient able to follow all commands with extra time                Home Living Family/patient expects to be discharged to:: Assisted living ([facility])                             Home Equipment: Rollator (4 wheels);Grab bars - toilet   Additional Comments: has assistance with meals and medication at ALF      Prior Functioning/Environment Prior Level of Function : History of Falls (last six months);Independent/Modified Independent             Mobility Comments: using rollator, she can ambulate to the dining room at ALF ADLs Comments: Pt independent with toileting and grooming. Per daughter, pt requires MIN A for dressing and bathing. Pt has assistance for an hour in the morning at ALF        OT Problem List: Decreased strength;Decreased activity tolerance;Impaired balance (sitting and/or standing)      OT Treatment/Interventions: Self-care/ADL training;Therapeutic exercise;Energy conservation;DME and/or AE instruction;Therapeutic activities;Patient/family education;Balance training    OT Goals(Current goals can be found in the care plan section) Acute Rehab OT Goals Patient Stated Goal: to return home OT Goal Formulation: With patient Time For Goal Achievement:  07/21/21 Potential to Achieve Goals: Good ADL Goals Pt Will Perform Grooming: with modified independence;standing Pt Will Transfer to Toilet: with modified independence;ambulating;regular height toilet;grab bars Pt Will Perform Toileting - Clothing Manipulation and hygiene: with supervision;sit to/from stand  OT Frequency: Min 2X/week    AM-PAC OT "6 Clicks" Daily Activity     Outcome Measure Help from another person eating meals?: None Help from another person taking care of personal grooming?: A Little Help from another person toileting, which includes using toliet, bedpan, or urinal?: A Little Help from another person bathing (including washing, rinsing, drying)?: A Little Help from another person to put on and taking off regular upper body clothing?: A Little Help from another person to put on and taking off regular lower body clothing?: A Little 6 Click Score: 19   End of Session Equipment Utilized During Treatment: Gait belt;Rolling walker (2 wheels) Nurse Communication: Mobility status  Activity Tolerance: Patient tolerated treatment well Patient left: in chair;with call bell/phone within reach;with family/visitor present  OT Visit Diagnosis: Unsteadiness on feet (R26.81);History of falling (Z91.81)                Time: 8295-6213 OT Time Calculation (min): 35 min Charges:  OT General Charges $OT Visit: 1 Visit OT Evaluation $OT Eval Moderate Complexity: 1 Mod OT  Treatments $Self Care/Home Management : 23-37 mins  Fredirick Maudlin, OTR/L [place]

## 2021-07-08 MED ORDER — AMOXICILLIN-POT CLAVULANATE 875-125 MG PO TABS: 1.0000 | ORAL_TABLET | Freq: Two times a day (BID) | ORAL | 0 refills | Status: AC

## 2021-07-08 MED ORDER — POLYETHYLENE GLYCOL 3350 17 G PO PACK: 17.0000 g | PACK | Freq: Every day | ORAL | 2 refills | Status: AC

## 2021-07-08 NOTE — Discharge Summary (Signed)
[facility] NAME: Shima Compere    MR#:  [id number]  DATE OF BIRTH:  [birth year]  DATE OF ADMISSION:  07/06/2021 ADMITTING PHYSICIAN: Ivor Costa, MD  DATE OF DISCHARGE: 07/08/2021  PRIMARY CARE PHYSICIAN: Venia Carbon, MD    ADMISSION DIAGNOSIS:  Colitis [K52.9] Hyponatremia [E87.1] Acute colitis [K52.9] Syncope, unspecified syncope type [R55]  DISCHARGE DIAGNOSIS:  Acute sigmoid colitis--mild constipation  SECONDARY DIAGNOSIS:   Past Medical History:  Diagnosis Date   Allergic rhinitis due to pollen    some shellfish also   Aortic sclerosis 5/12   On echo. no stenosis   Arthritis    Diastolic dysfunction    stress echo otherwise normal 1/04. Repeat 5/11 also negative   History of seasonal allergies    Hx of basal cell carcinoma 1990   multiple sites   Hx of squamous cell carcinoma of skin 2000   R nasolabial fold   Hyperlipidemia    Osteoarthritis, multiple sites    Osteoporosis    Overactive bladder    Urge incontinence    Vaginal odor 04/2018   aerobic vaginitis on One Swab culture    HOSPITAL COURSE:  BRILEE PORT is a 87 y.o. female with medical history significant of hyperlipidemia, GERD, dCHF, skin cancer, osteoporosis, osteoarthritis, who presents with nausea vomiting, diarrhea and abdominal pain.  Per her daughter, her symptoms started since yesterday, including nausea, vomiting, diarrhea and abdominal pain.     Sepsis due to acute colitis: Patient meets criteria for sepsis with WBC 15.2, tachypnea with RR 22.  Currently hemodynamically stable.  Lactic acid is normal 1.2.  --IV Zosyn -blood culture no growth -As needed morphine for pain, Zofran for nausea --Procalcitonin 0.86 -received IVF --sepsis improved, BC neg -- change to po Augmentin   Chronic diastolic heart failure ([place]):  --2D echo on 12/03/2010 which showed EF of 55 to 60%.  Patient does not have leg edema or JVD.  CHF seem  to be compensated.  Patient is not taking diuretics currently -BNP-- 85   GERD (gastroesophageal reflux disease) -Protonix   Syncope: Likely due to volume depletion and orthostatic status.  No focal neurodeficit on physical examination.  No injury per report. --PT/OT--recommends HH -- patient near baseline. Hemodynamically improving.   Hyponatremia: Sodium 130--135 -IV fluid as above -- suspected dehydration from G.I. loss   Constipation -- miralax daily prn   Procedures:none Family communication : daughter on the phone--left msg Consults : none CODE STATUS: full DVT Prophylaxis : enoxaparin Level of care: Telemetry Medical Status is: Inpatient   Remains inpatient appropriate because: acute colitis      discharge to [facility]    CONSULTS OBTAINED:    DRUG ALLERGIES:   Allergies  Allergen Reactions   Demerol [Meperidine]     dizzy   Shellfish Allergy    Avelox [Moxifloxacin Hcl In Nacl] Rash    DISCHARGE MEDICATIONS:   Allergies as of 07/08/2021       Reactions   Demerol [meperidine]    dizzy   Shellfish Allergy    Avelox [moxifloxacin Hcl In Nacl] Rash        Medication List     TAKE these medications    acetaminophen 650 MG CR tablet Commonly known as: TYLENOL Take 650 mg by mouth 3 (three) times daily.   amoxicillin-clavulanate 875-125 MG tablet Commonly known as: AUGMENTIN Take 1 tablet by mouth every 12 (twelve) hours for 10 doses.  aspirin EC 81 MG tablet Take 81 mg by mouth daily. Swallow whole.   celecoxib 200 MG capsule Commonly known as: CELEBREX TAKE 1 CAPSULE DAILY   dorzolamide-timolol 22.3-6.8 MG/ML ophthalmic solution Commonly known as: COSOPT Apply 1 drop to eye 2 (two) times daily.   EpiPen 2-Pak 0.3 mg/0.3 mL Soaj injection Generic drug: EPINEPHrine Inject into the muscle once.   fluticasone 50 MCG/ACT nasal spray Commonly known as: FLONASE Place 1 spray into both nostrils 2 (two) times a day.   GAS-X PO Take  by mouth 2 (two) times daily.   latanoprost 0.005 % ophthalmic solution Commonly known as: XALATAN Place 1 drop into both eyes at bedtime.   levocetirizine 5 MG tablet Commonly known as: XYZAL Take 5 mg by mouth every evening.   melatonin 3 MG Tabs tablet Take 3 mg by mouth at bedtime.   multivitamin tablet Take 1 tablet by mouth daily.   Myrbetriq 25 MG Tb24 tablet Generic drug: mirabegron ER Take 1 tablet (25 mg total) by mouth daily.   omeprazole 20 MG capsule Commonly known as: PRILOSEC Take 1 capsule (20 mg total) by mouth daily.   polyethylene glycol 17 g packet Commonly known as: MIRALAX / GLYCOLAX Take 17 g by mouth daily.   PROBIOTIC ADVANCED PO Take 1 capsule by mouth daily.   traMADol 50 MG tablet Commonly known as: ULTRAM TAKE ONE TABLET BY MOUTH AT BEDTIME What changed: when to take this        If you experience worsening of your admission symptoms, develop shortness of breath, life threatening emergency, suicidal or homicidal thoughts you must seek medical attention immediately by calling 911 or calling your MD immediately  if symptoms less severe.  You Must read complete instructions/literature along with all the possible adverse reactions/side effects for all the Medicines you take and that have been prescribed to you. Take any new Medicines after you have completely understood and accept all the possible adverse reactions/side effects.   Please note  You were cared for by a hospitalist during your hospital stay. If you have any questions about your discharge medications or the care you received while you were in the hospital after you are discharged, you can call the unit and asked to speak with the hospitalist on call if the hospitalist that took care of you is not available. Once you are discharged, your primary care physician will handle any further medical issues. Please note that NO REFILLS for any discharge medications will be authorized once you  are discharged, as it is imperative that you return to your primary care physician (or establish a relationship with a primary care physician if you do not have one) for your aftercare needs so that they can reassess your need for medications and monitor your lab values. Today   SUBJECTIVE  No new complaints No N/V/diarrhea   VITAL SIGNS:  Blood pressure (!) 167/65, pulse 74, temperature 98 F (36.7 C), temperature source Oral, resp. rate 18, height 5\' 6"  (1.676 m), weight 73.1 kg, SpO2 94 %.  I/O:   Intake/Output Summary (Last 24 hours) at 07/08/2021 0817 Last data filed at 07/07/2021 1909 Gross per 24 hour  Intake 580 ml  Output 500 ml  Net 80 ml    PHYSICAL EXAMINATION:  GENERAL:  87 y.o.-year-old patient lying in the bed with no acute distress.  LUNGS: Normal breath sounds bilaterally, no wheezing, rales,rhonchi or crepitation. No use of accessory muscles of respiration.  CARDIOVASCULAR: S1, S2 normal. No  murmurs, rubs, or gallops.  ABDOMEN: Soft, non-tender, non-distended. Bowel sounds present. No organomegaly or mass.  EXTREMITIES: No pedal edema, cyanosis, or clubbing.  NEUROLOGIC: non-focal PSYCHIATRIC:  patient is alert and oriented x 3.  SKIN: No obvious rash, lesion, or ulcer.   DATA REVIEW:   CBC  Recent Labs  Lab 07/07/21 0427  WBC 12.1*  HGB 9.9*  HCT 29.2*  PLT 254    Chemistries  Recent Labs  Lab 07/06/21 0134 07/07/21 0427  NA 130* 135  K 4.7 4.0  CL 101 106  CO2 20* 23  GLUCOSE 126* 99  BUN 39* 31*  CREATININE 0.99 0.92  CALCIUM 8.7* 8.0*  AST 25  --   ALT 14  --   ALKPHOS 80  --   BILITOT 0.9  --     Microbiology Results   Recent Results (from the past 240 hour(s))  Resp Panel by RT-PCR (Flu A&B, Covid) Nasopharyngeal Swab     Status: None   Collection Time: 07/06/21  3:00 AM   Specimen: Nasopharyngeal Swab; Nasopharyngeal(NP) swabs in vial transport medium  Result Value Ref Range Status   SARS Coronavirus 2 by RT PCR NEGATIVE  NEGATIVE Final    Comment: (NOTE) SARS-CoV-2 target nucleic acids are NOT DETECTED.  The SARS-CoV-2 RNA is generally detectable in upper respiratory specimens during the acute phase of infection. The lowest concentration of SARS-CoV-2 viral copies this assay can detect is 138 copies/mL. A negative result does not preclude SARS-Cov-2 infection and should not be used as the sole basis for treatment or other patient management decisions. A negative result may occur with  improper specimen collection/handling, submission of specimen other than nasopharyngeal swab, presence of viral mutation(s) within the areas targeted by this assay, and inadequate number of viral copies(<138 copies/mL). A negative result must be combined with clinical observations, patient history, and epidemiological information. The expected result is Negative.  Fact Sheet for Patients:  EntrepreneurPulse.com.au  Fact Sheet for Healthcare Providers:  IncredibleEmployment.be  This test is no t yet approved or cleared by the [place] FDA and  has been authorized for detection and/or diagnosis of SARS-CoV-2 by FDA under an Emergency Use Authorization (EUA). This EUA will remain  in effect (meaning this test can be used) for the duration of the COVID-19 declaration under Section 564(b)(1) of the Act, 21 U.S.C.section 360bbb-3(b)(1), unless the authorization is terminated  or revoked sooner.       Influenza A by PCR NEGATIVE NEGATIVE Final   Influenza B by PCR NEGATIVE NEGATIVE Final    Comment: (NOTE) The Xpert Xpress SARS-CoV-2/FLU/RSV plus assay is intended as an aid in the diagnosis of influenza from Nasopharyngeal swab specimens and should not be used as a sole basis for treatment. Nasal washings and aspirates are unacceptable for Xpert Xpress SARS-CoV-2/FLU/RSV testing.  Fact Sheet for Patients: EntrepreneurPulse.com.au  Fact Sheet for Healthcare  Providers: IncredibleEmployment.be  This test is not yet approved or cleared by the [place] FDA and has been authorized for detection and/or diagnosis of SARS-CoV-2 by FDA under an Emergency Use Authorization (EUA). This EUA will remain in effect (meaning this test can be used) for the duration of the COVID-19 declaration under Section 564(b)(1) of the Act, 21 U.S.C. section 360bbb-3(b)(1), unless the authorization is terminated or revoked.  Performed at [facility], [address], [place], [place] [address]   C Difficile Quick Screen w PCR reflex     Status: None   Collection Time: 07/06/21  3:41  PM   Specimen: STOOL  Result Value Ref Range Status   C Diff antigen NEGATIVE NEGATIVE Final   C Diff toxin NEGATIVE NEGATIVE Final   C Diff interpretation No C. difficile detected.  Final    Comment: Performed at [facility], [address] [place], [place] [address]  Gastrointestinal Panel by PCR , Stool     Status: None   Collection Time: 07/06/21  3:41 PM   Specimen: Stool  Result Value Ref Range Status   Campylobacter species NOT DETECTED NOT DETECTED Final   Plesimonas shigelloides NOT DETECTED NOT DETECTED Final   Salmonella species NOT DETECTED NOT DETECTED Final   Yersinia enterocolitica NOT DETECTED NOT DETECTED Final   Vibrio species NOT DETECTED NOT DETECTED Final   Vibrio cholerae NOT DETECTED NOT DETECTED Final   Enteroaggregative E coli (EAEC) NOT DETECTED NOT DETECTED Final   Enteropathogenic E coli (EPEC) NOT DETECTED NOT DETECTED Final   Enterotoxigenic E coli (ETEC) NOT DETECTED NOT DETECTED Final   Shiga like toxin producing E coli (STEC) NOT DETECTED NOT DETECTED Final   Shigella/Enteroinvasive E coli (EIEC) NOT DETECTED NOT DETECTED Final   Cryptosporidium NOT DETECTED NOT DETECTED Final   Cyclospora cayetanensis NOT DETECTED NOT DETECTED Final   Entamoeba histolytica NOT DETECTED NOT DETECTED Final   Giardia  lamblia NOT DETECTED NOT DETECTED Final   Adenovirus F40/41 NOT DETECTED NOT DETECTED Final   Astrovirus NOT DETECTED NOT DETECTED Final   Norovirus GI/GII NOT DETECTED NOT DETECTED Final   Rotavirus A NOT DETECTED NOT DETECTED Final   Sapovirus (I, II, IV, and V) NOT DETECTED NOT DETECTED Final    Comment: Performed at [facility], [address] [place], [place] [address]  Culture, blood (x 2)     Status: None (Preliminary result)   Collection Time: 07/06/21  5:49 PM   Specimen: BLOOD  Result Value Ref Range Status   Specimen Description BLOOD LEFT HAND  Final   Special Requests   Final    BOTTLES DRAWN AEROBIC AND ANAEROBIC Blood Culture adequate volume   Culture   Final    NO GROWTH < 24 HOURS Performed at [facility], [address] [place], [place] [address]    Report Status PENDING  Incomplete  Culture, blood (x 2)     Status: None (Preliminary result)   Collection Time: 07/06/21  6:57 PM   Specimen: BLOOD  Result Value Ref Range Status   Specimen Description BLOOD [facility] Xxx  Final   Special Requests   Final    BOTTLES DRAWN AEROBIC AND ANAEROBIC Blood Culture adequate volume   Culture   Final    NO GROWTH < 12 HOURS Performed at [facility], [address] [place],  [address]    Report Status PENDING  Incomplete    RADIOLOGY:  No results found.   CODE STATUS:     Code Status Orders  (From admission, onward)           Start     Ordered   07/06/21 0756  Full code  Continuous        07/06/21 0755           Code Status History     Date Active Date Inactive Code Status Order ID Comments User Context   04/27/2017 1425 04/28/2017 1644 Full Code [id number]  Corky Mull, MD Inpatient   05/23/2015 1359 05/26/2015 1752 Full Code [id number]  Hessie Knows, MD Inpatient  Advance Directive Documentation    Flowsheet Row Most Recent Value  Type of Advance Directive Healthcare Power of Attorney, Living will   Pre-existing out of facility DNR order (yellow form or pink MOST form) --  "MOST" Form in Place? --        TOTAL TIME TAKING CARE OF THIS PATIENT: 35 minutes.    Fritzi Mandes M.D  [facility]    CC: Primary care physician; Venia Carbon, MD

## 2021-07-08 NOTE — TOC Transition Note (Signed)
Transition of Care [facility]) - CM/SW Discharge Note   Patient Details  Name: [name] [name] MRN: [id number] Date of Birth: 1924/06/10  [facility]) CM/SW Contact:  Candie Chroman, LCSW Phone Number: 07/08/2021, 10:37 AM   Clinical Narrative:   Patient has orders to discharge back to [name] today. Faxed FL2 and discharge summary to [name]. They will set up home health PT and OT through their own therapy services. Patient has already left the hospital. No further concerns. CSW signing off.  Final next level of care: Assisted Living Barriers to Discharge: Barriers Resolved   Patient Goals and CMS Choice Patient states their goals for this hospitalization and ongoing recovery are:: To return to [name]   Choice offered to / list presented to : NA  Discharge Placement                Patient to be transferred to facility by: Family will transport   Patient and family notified of of transfer: 07/08/21  Discharge Plan and Services                          HH Arranged: PT, OT [place] Agency: Other - See comment Hessie Knows home health services) Date HH Agency Contacted: 07/08/21   Representative spoke with at [place]: Luellen Pucker at [name] ([place]) Interventions     Readmission Risk Interventions No flowsheet data found.

## 2021-07-08 NOTE — NC FL2 (Signed)
[place] LEVEL OF CARE SCREENING TOOL     IDENTIFICATION  Patient Name: [name] [name] Birthdate: Nov 13, 1923 Sex: female Admission Date (Current Location): 07/06/2021  [facility] and [place] Number:  Engineering geologist and Address:  [facility], [address] [place], [place] [address]      Provider Number: [id number]  Attending Physician Name and Address:  Fritzi Mandes, MD  Relative Name and Phone Number:  Robinette Haines (Daughter)   [phone]    Current Level of Care: Hospital Recommended Level of Care: [place] (with home health PT and OT) Prior Approval Number:    Date Approved/Denied:   PASRR Number:    Discharge Plan: Other (Comment) (ALF with home health PT and OT)    Current Diagnoses: Patient Active Problem List   Diagnosis Date Noted   Acute colitis 07/06/2021   Sepsis ([place]) 07/06/2021   Syncope 07/06/2021   Hyponatremia 07/06/2021   Peripheral venous insufficiency 04/17/2021   Atherosclerosis of aorta ([place]) 04/25/2020   Insomnia 01/27/2020   GERD (gastroesophageal reflux disease) 01/27/2020   Chronic constipation 08/18/2018   Allergic rhinitis due to pollen    Osteoporosis    Osteoarthritis, multiple sites    Urge incontinence    Chronic diastolic heart failure ([place])     Orientation RESPIRATION BLADDER Height & Weight     Self, Time, Situation, Place  Normal Continent Weight: 161 lb 2.5 oz (73.1 kg) Height:  5\' 6"  (167.6 cm)  BEHAVIORAL SYMPTOMS/MOOD NEUROLOGICAL BOWEL NUTRITION STATUS   (None)  (None) Continent Diet (DYS 3)  AMBULATORY STATUS COMMUNICATION OF NEEDS Skin   Limited Assist Verbally Bruising                       Personal Care Assistance Level of Assistance  Feeding, Dressing, Bathing Bathing Assistance: Limited assistance Feeding assistance: Limited assistance Dressing Assistance: Limited assistance     Functional Limitations Info  Sight, Hearing, Speech  Sight Info: Adequate Hearing Info: Adequate Speech Info: Adequate    SPECIAL CARE FACTORS FREQUENCY  PT (By licensed PT), OT (By licensed OT)     PT Frequency: 3 x week OT Frequency: 3 x week            Contractures Contractures Info: Not present    Additional Factors Info  Code Status, Allergies Code Status Info: Full code Allergies Info: Demerol (Meperidine), Shellfish Allergy, Avelox (Moxfloxacin Hcl in Nacl)           Current Medications (07/08/2021):  This is the current hospital active medication list Current Facility-Administered Medications  Medication Dose Route Frequency Provider Last Rate Last Admin   acetaminophen (TYLENOL) tablet 650 mg  650 mg Oral Q6H PRN Ivor Costa, MD   650 mg at 07/06/21 2122   acidophilus (RISAQUAD) capsule 1 capsule  1 capsule Oral Daily Ivor Costa, MD   1 capsule at 07/08/21 0954   amoxicillin-clavulanate (AUGMENTIN) 875-125 MG per tablet 1 tablet  1 tablet Oral Q12H Fritzi Mandes, MD   1 tablet at 07/08/21 0954   aspirin EC tablet 81 mg  81 mg Oral Daily Ivor Costa, MD   81 mg at 07/08/21 0954   dorzolamide-timolol (COSOPT) 22.3-6.8 MG/ML ophthalmic solution 1 drop  1 drop Both Eyes BID Ivor Costa, MD   1 drop at 07/08/21 0954   enoxaparin (LOVENOX) injection 40 mg  40 mg Subcutaneous Q24H Ivor Costa, MD   40 mg at 07/08/21 0952   fluticasone (FLONASE)  50 MCG/ACT nasal spray 1 spray  1 spray Each Nare Daily Ivor Costa, MD   1 spray at 07/08/21 0953   hydrALAZINE (APRESOLINE) injection 5 mg  5 mg Intravenous Q2H PRN Ivor Costa, MD       latanoprost (XALATAN) 0.005 % ophthalmic solution 1 drop  1 drop Both Eyes QHS Ivor Costa, MD   1 drop at 07/07/21 2006   loratadine (CLARITIN) tablet 10 mg  10 mg Oral QPM Ivor Costa, MD   10 mg at 07/07/21 1756   melatonin tablet 5 mg  5 mg Oral QHS Ivor Costa, MD   5 mg at 07/06/21 2123   mirabegron ER (MYRBETRIQ) tablet 25 mg  25 mg Oral Daily Ivor Costa, MD   25 mg at 07/08/21 8413   morphine 2 MG/ML  injection 0.5 mg  0.5 mg Intravenous Q4H PRN Ivor Costa, MD       multivitamin with minerals tablet 1 tablet  1 tablet Oral Daily Ivor Costa, MD   1 tablet at 07/08/21 0954   ondansetron (ZOFRAN) injection 4 mg  4 mg Intravenous Q8H PRN Ivor Costa, MD       pantoprazole (PROTONIX) EC tablet 40 mg  40 mg Oral Daily Ivor Costa, MD   40 mg at 07/08/21 0954   polyethylene glycol (MIRALAX / GLYCOLAX) packet 17 g  17 g Oral BID Fritzi Mandes, MD   17 g at 07/07/21 2004   simethicone (MYLICON) chewable tablet 80 mg  80 mg Oral BID Ivor Costa, MD   80 mg at 07/08/21 0953   traMADol (ULTRAM) tablet 50 mg  50 mg Oral QPM Ivor Costa, MD   50 mg at 07/07/21 1756     Discharge Medications: TAKE these medications     acetaminophen 650 MG CR tablet Commonly known as: TYLENOL Take 650 mg by mouth 3 (three) times daily.    amoxicillin-clavulanate 875-125 MG tablet Commonly known as: AUGMENTIN Take 1 tablet by mouth every 12 (twelve) hours for 10 doses.    aspirin EC 81 MG tablet Take 81 mg by mouth daily. Swallow whole.    celecoxib 200 MG capsule Commonly known as: CELEBREX TAKE 1 CAPSULE DAILY    dorzolamide-timolol 22.3-6.8 MG/ML ophthalmic solution Commonly known as: COSOPT Apply 1 drop to eye 2 (two) times daily.    EpiPen 2-Pak 0.3 mg/0.3 mL Soaj injection Generic drug: EPINEPHrine Inject into the muscle once.    fluticasone 50 MCG/ACT nasal spray Commonly known as: FLONASE Place 1 spray into both nostrils 2 (two) times a day.    GAS-X PO Take by mouth 2 (two) times daily.    latanoprost 0.005 % ophthalmic solution Commonly known as: XALATAN Place 1 drop into both eyes at bedtime.    levocetirizine 5 MG tablet Commonly known as: XYZAL Take 5 mg by mouth every evening.    melatonin 3 MG Tabs tablet Take 3 mg by mouth at bedtime.    multivitamin tablet Take 1 tablet by mouth daily.    Myrbetriq 25 MG Tb24 tablet Generic drug: mirabegron ER Take 1 tablet (25 mg total) by  mouth daily.    omeprazole 20 MG capsule Commonly known as: PRILOSEC Take 1 capsule (20 mg total) by mouth daily.    polyethylene glycol 17 g packet Commonly known as: MIRALAX / GLYCOLAX Take 17 g by mouth daily.    PROBIOTIC ADVANCED PO Take 1 capsule by mouth daily.    traMADol 50 MG tablet Commonly known as: Veatrice Bourbon  TAKE ONE TABLET BY MOUTH AT BEDTIME What changed: when to take this    Relevant Imaging Results:  Relevant Lab Results:   Additional Information SS#: [id number]  Candie Chroman, LCSW

## 2021-07-10 ENCOUNTER — Telehealth: Payer: Self-pay | Admitting: Internal Medicine

## 2021-07-10 ENCOUNTER — Other Ambulatory Visit: Payer: Self-pay

## 2021-07-10 ENCOUNTER — Ambulatory Visit: Payer: Medicare Other | Admitting: Internal Medicine

## 2021-07-10 ENCOUNTER — Encounter: Payer: Self-pay | Admitting: Internal Medicine

## 2021-07-10 DIAGNOSIS — I5032 Chronic diastolic (congestive) heart failure: Secondary | ICD-10-CM

## 2021-07-10 DIAGNOSIS — M159 Polyosteoarthritis, unspecified: Secondary | ICD-10-CM

## 2021-07-10 DIAGNOSIS — N3941 Urge incontinence: Secondary | ICD-10-CM | POA: Diagnosis not present

## 2021-07-10 DIAGNOSIS — M150 Primary generalized (osteo)arthritis: Secondary | ICD-10-CM

## 2021-07-10 DIAGNOSIS — K529 Noninfective gastroenteritis and colitis, unspecified: Secondary | ICD-10-CM

## 2021-07-10 DIAGNOSIS — E441 Mild protein-calorie malnutrition: Secondary | ICD-10-CM

## 2021-07-10 NOTE — Telephone Encounter (Signed)
Spoke to pt's daughter. Advised her Dr Silvio Pate saw her this morning. Advised if she had any questions to contact [facility].

## 2021-07-10 NOTE — Assessment & Plan Note (Signed)
Will try off the myrbetriq--just in case If really bad incontinence, can restart

## 2021-07-10 NOTE — Assessment & Plan Note (Signed)
Compensated  Just on ASA

## 2021-07-10 NOTE — Assessment & Plan Note (Addendum)
Pain is less of an issue Try off the celebrex  Is on tylenol regularly and tramadol at bedtime

## 2021-07-10 NOTE — Telephone Encounter (Signed)
Pt daughter called stating that pt is back from the hospital. Pt daughter also states that she would like for Dr Silvio Pate to check on pt at [facility].

## 2021-07-10 NOTE — Assessment & Plan Note (Addendum)
Ongoing mild symptoms and anorexia Finishing course of augmentin Was taken off naproxen but I am especially concerned that the celebrex could be implicated also---will stop Also try off myrbetriq (rarely can cause diarrhea/intestinal problems)

## 2021-07-10 NOTE — Assessment & Plan Note (Signed)
Has lost 10-15# and still has anorexia Discussed being more carb based for eating till her intestines have calmed down

## 2021-07-10 NOTE — Addendum Note (Signed)
Addended by: Viviana Simpler I on: 07/10/2021 12:52 PM   Modules accepted: Orders

## 2021-07-10 NOTE — Progress Notes (Signed)
Subjective:    Patient ID: [name] [name], female    DOB: 1923-09-15, 87 y.o.   MRN: [id number]  HPI Visit in assisted living apartment for hospital follow up Reviewed status with Luellen Pucker RN  She doesn't remember what happened She felt weak and passed out Some abdominal pain--but this was not new ER reported nausea, vomiting and diarrhea She does remember anorexia due to nausea No fever CT scan showed wall thickening in proximal descending colon perhaps indicative of colitis Treated with parenteral antibiotics and transitioned to augmentin the day before discharge (on 12/6)  She notes fatigue--even before the hospitalization Ongoing now anorexia and occasional nausea and abdominal pain No fever Slight loose stool--but only gone twice since coming back  Has aide in the morning--past PM care (but she states not now) Help with dressing. Wears pad for incontinence  Current Outpatient Medications on File Prior to Visit  Medication Sig Dispense Refill   acetaminophen (TYLENOL) 650 MG CR tablet Take 650 mg by mouth 3 (three) times daily.     amoxicillin-clavulanate (AUGMENTIN) 875-125 MG tablet Take 1 tablet by mouth every 12 (twelve) hours for 10 doses. 10 tablet 0   aspirin EC 81 MG tablet Take 81 mg by mouth daily. Swallow whole.     celecoxib (CELEBREX) 200 MG capsule TAKE 1 CAPSULE DAILY (Patient taking differently: Take 200 mg by mouth daily.) 90 capsule 3   dorzolamide-timolol (COSOPT) 22.3-6.8 MG/ML ophthalmic solution Apply 1 drop to eye 2 (two) times daily.     EPIPEN 2-PAK 0.3 MG/0.3ML SOAJ injection Inject into the muscle once.     fluticasone (FLONASE) 50 MCG/ACT nasal spray Place 1 spray into both nostrils 2 (two) times a day.     latanoprost (XALATAN) 0.005 % ophthalmic solution Place 1 drop into both eyes at bedtime.     levocetirizine (XYZAL) 5 MG tablet Take 5 mg by mouth every evening.     melatonin 3 MG TABS tablet Take 3 mg by mouth at bedtime.     Multiple  Vitamin (MULTIVITAMIN) tablet Take 1 tablet by mouth daily.     MYRBETRIQ 25 MG TB24 tablet Take 1 tablet (25 mg total) by mouth daily. 90 tablet 1   omeprazole (PRILOSEC) 20 MG capsule Take 1 capsule (20 mg total) by mouth daily. 30 capsule 3   polyethylene glycol (MIRALAX / GLYCOLAX) 17 g packet Take 17 g by mouth daily. 30 each 2   Probiotic Product (PROBIOTIC ADVANCED PO) Take 1 capsule by mouth daily.     Simethicone (GAS-X PO) Take by mouth 2 (two) times daily.     traMADol (ULTRAM) 50 MG tablet TAKE ONE TABLET BY MOUTH AT BEDTIME (Patient taking differently: Take 50 mg by mouth every evening.) 90 tablet 0   No current facility-administered medications on file prior to visit.    Allergies  Allergen Reactions   Demerol [Meperidine]     dizzy   Shellfish Allergy    Avelox [Moxifloxacin Hcl In Nacl] Rash    Past Medical History:  Diagnosis Date   Allergic rhinitis due to pollen    some shellfish also   Aortic sclerosis 5/12   On echo. no stenosis   Arthritis    Diastolic dysfunction    stress echo otherwise normal 1/04. Repeat 5/11 also negative   History of seasonal allergies    Hx of basal cell carcinoma 1990   multiple sites   Hx of squamous cell carcinoma of skin 2000   R nasolabial  fold   Hyperlipidemia    Osteoarthritis, multiple sites    Osteoporosis    Overactive bladder    Urge incontinence    Vaginal odor 04/2018   aerobic vaginitis on One Swab culture    Past Surgical History:  Procedure Laterality Date   CYSTOCELE REPAIR  2007   and rectocele   DILATION AND CURETTAGE OF UTERUS     INGUINAL HERNIA REPAIR  2012   left side with mesh   REVERSE SHOULDER ARTHROPLASTY Right 04/27/2017   Procedure: REVERSE SHOULDER ARTHROPLASTY;  Surgeon: Corky Mull, MD;  Location: [facility] ORS;  Service: Orthopedics;  Laterality: Right;   TONSILLECTOMY AND ADENOIDECTOMY     as child   TOTAL HIP ARTHROPLASTY Right 05/23/2015   Procedure: TOTAL HIP ARTHROPLASTY ANTERIOR  APPROACH;  Surgeon: Hessie Knows, MD;  Location: [facility] ORS;  Service: Orthopedics;  Laterality: Right;   UMBILICAL HERNIA REPAIR  02/04/06    Family History  Problem Relation Age of Onset   Cancer Father 23       colon cancer   Heart disease Father     Social History   Socioeconomic History   Marital status: Widowed    Spouse name: Not on file   Number of children: 3   Years of education: Not on file   Highest education level: Not on file  Occupational History   Occupation: Pharmacist, hospital    Comment: short time   Occupation: Herbalist    Comment: most of career  Tobacco Use   Smoking status: Former   Smokeless tobacco: Never  Scientific laboratory technician Use: Never used  Substance and Sexual Activity   Alcohol use: Yes    Comment: wine before dinner   Drug use: No   Sexual activity: Not Currently    Birth control/protection: Post-menopausal  Other Topics Concern   Not on file  Social History Narrative   Widowed 2019   Has living will   Daughter [name], is health care POA   Would accept trial of resuscitation   Would probably accept feeding tube   Social Determinants of Health   Financial Resource Strain: Not on file  Food Insecurity: Not on file  Transportation Needs: Not on file  Physical Activity: Not on file  Stress: Not on file  Social Connections: Not on file  Intimate Partner Violence: Not on file   Review of Systems No sig cough Feels SOB quite a bit Still has urge incontinence ---myrbetriq may be some help Not much pain    Objective:   Physical Exam Constitutional:      Comments: Looks washed out with thinning of face  Cardiovascular:     Rate and Rhythm: Normal rate and regular rhythm.     Heart sounds: No murmur heard.   No gallop.  Pulmonary:     Effort: Pulmonary effort is normal.     Breath sounds: Normal breath sounds. No wheezing or rales.  Abdominal:     General: Bowel sounds are normal.     Palpations: Abdomen is soft.     Tenderness:  There is no guarding or rebound.     Comments: Slight distention Mild generalized tenderness  Musculoskeletal:     Cervical back: Neck supple.     Right lower leg: No edema.     Left lower leg: No edema.  Lymphadenopathy:     Cervical: No cervical adenopathy.  Skin:    Findings: No rash.  Neurological:     Mental Status:  She is alert.  Psychiatric:        Mood and Affect: Mood normal.        Behavior: Behavior normal.           Assessment & Plan:

## 2021-07-11 LAB — CULTURE, BLOOD (ROUTINE X 2)
Culture: NO GROWTH
Culture: NO GROWTH
Special Requests: ADEQUATE
Special Requests: ADEQUATE

## 2021-07-15 DIAGNOSIS — K529 Noninfective gastroenteritis and colitis, unspecified: Secondary | ICD-10-CM | POA: Diagnosis not present

## 2021-07-15 DIAGNOSIS — I5032 Chronic diastolic (congestive) heart failure: Secondary | ICD-10-CM | POA: Diagnosis not present

## 2021-07-15 DIAGNOSIS — R5381 Other malaise: Secondary | ICD-10-CM | POA: Diagnosis not present

## 2021-07-15 DIAGNOSIS — E441 Mild protein-calorie malnutrition: Secondary | ICD-10-CM | POA: Diagnosis not present

## 2021-07-15 DIAGNOSIS — M159 Polyosteoarthritis, unspecified: Secondary | ICD-10-CM

## 2021-08-08 ENCOUNTER — Non-Acute Institutional Stay: Payer: Medicare Other | Admitting: Internal Medicine

## 2021-08-08 VITALS — BP 145/79 | HR 76 | Temp 98.1°F | Resp 18 | Wt 143.4 lb

## 2021-08-08 DIAGNOSIS — R531 Weakness: Secondary | ICD-10-CM | POA: Diagnosis not present

## 2021-08-12 ENCOUNTER — Encounter: Payer: Self-pay | Admitting: Internal Medicine

## 2021-08-12 NOTE — Progress Notes (Signed)
Subjective:    Patient ID: [name] [name], female    DOB: 10/28/1923, 88 y.o.   MRN: [id number]  HPI  Resident seen in [address] for SNF followup Reviewed with RN, she reports she is doing much better. She was sent to SNF for weakness, decline in status. She worked with therapy and has improved significantly. Resident reports she has been sleeping well. She walks with her walker, feels strong and denies recent fall. Her appetite has been good, she has not lost any more weight. She denies chest pain or shortness of breath. She reports her bowels are moving okay, she does have some urinary incontinence, wears pads. She reports her mood has been good.  Review of Systems     Past Medical History:  Diagnosis Date   Allergic rhinitis due to pollen    some shellfish also   Aortic sclerosis 5/12   On echo. no stenosis   Arthritis    Diastolic dysfunction    stress echo otherwise normal 1/04. Repeat 5/11 also negative   History of seasonal allergies    Hx of basal cell carcinoma 1990   multiple sites   Hx of squamous cell carcinoma of skin 2000   R nasolabial fold   Hyperlipidemia    Osteoarthritis, multiple sites    Osteoporosis    Overactive bladder    Urge incontinence    Vaginal odor 04/2018   aerobic vaginitis on One Swab culture    Current Outpatient Medications  Medication Sig Dispense Refill   acetaminophen (TYLENOL) 650 MG CR tablet Take 650 mg by mouth 3 (three) times daily.     aspirin EC 81 MG tablet Take 81 mg by mouth daily. Swallow whole.     dorzolamide-timolol (COSOPT) 22.3-6.8 MG/ML ophthalmic solution Apply 1 drop to eye 2 (two) times daily.     EPIPEN 2-PAK 0.3 MG/0.3ML SOAJ injection Inject into the muscle once.     fluticasone (FLONASE) 50 MCG/ACT nasal spray Place 1 spray into both nostrils 2 (two) times a day.     latanoprost (XALATAN) 0.005 % ophthalmic solution Place 1 drop into both eyes at bedtime.     levocetirizine (XYZAL) 5 MG tablet Take 5 mg by  mouth every evening.     melatonin 3 MG TABS tablet Take 3 mg by mouth at bedtime.     Multiple Vitamin (MULTIVITAMIN) tablet Take 1 tablet by mouth daily.     omeprazole (PRILOSEC) 20 MG capsule Take 1 capsule (20 mg total) by mouth daily. 30 capsule 3   polyethylene glycol (MIRALAX / GLYCOLAX) 17 g packet Take 17 g by mouth daily. 30 each 2   Probiotic Product (PROBIOTIC ADVANCED PO) Take 1 capsule by mouth daily.     Simethicone (GAS-X PO) Take by mouth 2 (two) times daily.     traMADol (ULTRAM) 50 MG tablet TAKE ONE TABLET BY MOUTH AT BEDTIME (Patient taking differently: Take 50 mg by mouth every evening.) 90 tablet 0   No current facility-administered medications for this visit.    Allergies  Allergen Reactions   Demerol [Meperidine]     dizzy   Shellfish Allergy    Avelox [Moxifloxacin Hcl In Nacl] Rash    Family History  Problem Relation Age of Onset   Cancer Father 53       colon cancer   Heart disease Father     Social History   Socioeconomic History   Marital status: Widowed    Spouse name: Not on  file   Number of children: 3   Years of education: Not on file   Highest education level: Not on file  Occupational History   Occupation: Pharmacist, hospital    Comment: short time   Occupation: Herbalist    Comment: most of career  Tobacco Use   Smoking status: Former   Smokeless tobacco: Never  Scientific laboratory technician Use: Never used  Substance and Sexual Activity   Alcohol use: Yes    Comment: wine before dinner   Drug use: No   Sexual activity: Not Currently    Birth control/protection: Post-menopausal  Other Topics Concern   Not on file  Social History Narrative   Widowed 2019   Has living will   Daughter [name], is health care POA   Would accept trial of resuscitation   Would probably accept feeding tube   Social Determinants of Health   Financial Resource Strain: Not on file  Food Insecurity: Not on file  Transportation Needs: Not on file  Physical  Activity: Not on file  Stress: Not on file  Social Connections: Not on file  Intimate Partner Violence: Not on file     Constitutional: Pt reports chronic fatigue. Denies fever, malaise, headache or abrupt weight changes.  HEENT: Pt reports difficulty hearing. Denies eye pain, eye redness, ear pain, ringing in the ears, wax buildup, runny nose, nasal congestion, bloody nose, or sore throat. Respiratory: Denies difficulty breathing, shortness of breath, cough or sputum production.   Cardiovascular: Pt reports lower extremity edema. Denies chest pain, chest tightness, palpitations or swelling in the hands.  Gastrointestinal: Denies abdominal pain, bloating, constipation, diarrhea or blood in the stool.  GU: Pt reports urinary incontinence. Denies pain with urination, burning sensation, blood in urine, odor or discharge. Musculoskeletal: Denies decrease in range of motion, difficulty with gait, muscle pain or joint pain and swelling.  Skin: Denies redness, rashes, lesions or ulcercations.  Neurological: Pt has difficulty with memory. Denies dizziness, difficulty with speech or problems with balance and coordination.    No other specific complaints in a complete review of systems (except as listed in HPI above).  Objective:   Physical Exam  BP (!) 145/79    Pulse 76    Temp 98.1 F (36.7 C)    Resp 18    Wt 143 lb 6.4 oz (65 kg)    SpO2 97%    BMI 23.15 kg/m  Wt Readings from Last 3 Encounters:  08/12/21 143 lb 6.4 oz (65 kg)  07/10/21 144 lb 9.6 oz (65.6 kg)  07/06/21 161 lb 2.5 oz (73.1 kg)    General: Appears her stated age, well appearing, in NAD. Skin: Warm, dry and intact.  Cardiovascular: Normal rate and rhythm. S1,S2 noted.  No murmur, rubs or gallops noted. Thick calves but no appreciable BLE edema.  Pulmonary/Chest: Normal effort and positive vesicular breath sounds.  Abdomen: Soft and nontender.  Musculoskeletal: Gait slow and steady with use of rolling  walker. Neurological: Alert and oriented.   BMET    Component Value Date/Time   NA 135 07/07/2021 0427   K 4.0 07/07/2021 0427   CL 106 07/07/2021 0427   CO2 23 07/07/2021 0427   GLUCOSE 99 07/07/2021 0427   BUN 31 (H) 07/07/2021 0427   CREATININE 0.92 07/07/2021 0427   CALCIUM 8.0 (L) 07/07/2021 0427   GFRNONAA 57 (L) 07/07/2021 0427   GFRAA >60 04/28/2017 0412    Lipid Panel     Component Value Date/Time  CHOL 232 (H) 02/06/2013 1108   TRIG 72.0 02/06/2013 1108   HDL 77.50 02/06/2013 1108   CHOLHDL 3 02/06/2013 1108   VLDL 14.4 02/06/2013 1108    CBC    Component Value Date/Time   WBC 12.1 (H) 07/07/2021 0427   RBC 2.93 (L) 07/07/2021 0427   HGB 9.9 (L) 07/07/2021 0427   HCT 29.2 (L) 07/07/2021 0427   PLT 254 07/07/2021 0427   MCV 99.7 07/07/2021 0427   MCH 33.8 07/07/2021 0427   MCHC 33.9 07/07/2021 0427   RDW 14.1 07/07/2021 0427   LYMPHSABS 5.6 (H) 07/06/2021 0134   MONOABS 0.8 07/06/2021 0134   EOSABS 0.3 07/06/2021 0134   BASOSABS 0.1 07/06/2021 0134    Hgb A1C No results found for: HGBA1C          Assessment & Plan:   SNF Follow Up for Generalized Weakness:  Reviewed with RN and resident, she is feeling much better She is going down for meals She has an aide that comes in in the morning for personal care No medication changes at this time Will monitor  Will reassess as needed Webb Silversmith, NP This visit occurred during the SARS-CoV-2 public health emergency.  Safety protocols were in place, including screening questions prior to the visit, additional usage of staff PPE, and extensive cleaning of exam room while observing appropriate contact time as indicated for disinfecting solutions.

## 2021-08-12 NOTE — Patient Instructions (Signed)
Weakness Weakness is a lack of strength. You may feel weak all over your body (generalized), or you may feel weak in one part of your body (focal). There are many potential causes of weakness. Sometimes, the cause of your weakness may not be known. Some causes of weakness can be serious, so it isimportant to see your doctor. Follow these instructions at home: Activity Rest as needed. Try to get enough sleep. Most adults need 7-8 hours of sleep each night. Talk to your doctor about how much sleep you need each night. Do exercises, such as arm curls and leg raises, for 30 minutes at least 2 days a week or as told by your doctor. Think about working with a physical therapist or trainer to help you get stronger. General instructions  Take over-the-counter and prescription medicines only as told by your doctor. Eat a healthy, well-balanced diet. This includes: Proteins to build muscles, such as lean meats and fish. Fresh fruits and vegetables. Carbohydrates to boost energy, such as whole grains. Drink enough fluid to keep your pee (urine) pale yellow. Keep all follow-up visits as told by your doctor. This is important.  Contact a doctor if: Your weakness does not get better or it gets worse. Your weakness affects your ability to: Think clearly. Do your normal daily activities. Get help right away if you: Have sudden weakness on one side of your face or body. Have chest pain. Have trouble breathing or shortness of breath. Have problems with your vision. Have trouble talking or swallowing. Have trouble standing or walking. Are light-headed. Pass out (lose consciousness). Summary Weakness is a lack of strength. You may feel weak all over your body or just in one part of your body. There are many potential causes of weakness. Sometimes, the cause of your weakness may not be known. Rest as needed, and try to get enough sleep. Most adults need 7-8 hours of sleep each night. Eat a healthy,  well-balanced diet. This information is not intended to replace advice given to you by your health care provider. Make sure you discuss any questions you have with your healthcare provider. Document Revised: 02/23/2018 Document Reviewed: 02/23/2018 Elsevier Patient Education  2022 [employer]  

## 2021-08-13 ENCOUNTER — Emergency Department
Admission: EM | Admit: 2021-08-13 | Discharge: 2021-08-13 | Disposition: A | Discharge status: Home / Self Care | Payer: Medicare Other | Attending: Emergency Medicine | Admitting: Emergency Medicine

## 2021-08-13 ENCOUNTER — Emergency Department: Payer: Medicare Other

## 2021-08-13 ENCOUNTER — Encounter: Payer: Self-pay | Admitting: Emergency Medicine

## 2021-08-13 ENCOUNTER — Other Ambulatory Visit: Payer: Self-pay

## 2021-08-13 DIAGNOSIS — W1839XA Other fall on same level, initial encounter: Secondary | ICD-10-CM | POA: Insufficient documentation

## 2021-08-13 DIAGNOSIS — S79912A Unspecified injury of left hip, initial encounter: Secondary | ICD-10-CM | POA: Diagnosis present

## 2021-08-13 DIAGNOSIS — S7002XA Contusion of left hip, initial encounter: Secondary | ICD-10-CM | POA: Insufficient documentation

## 2021-08-13 DIAGNOSIS — Z85828 Personal history of other malignant neoplasm of skin: Secondary | ICD-10-CM | POA: Insufficient documentation

## 2021-08-13 LAB — URINALYSIS, ROUTINE W REFLEX MICROSCOPIC
Bilirubin Urine: NEGATIVE
Glucose, UA: NEGATIVE mg/dL
Hgb urine dipstick: NEGATIVE
Ketones, ur: NEGATIVE mg/dL
Nitrite: NEGATIVE
Protein, ur: NEGATIVE mg/dL
Specific Gravity, Urine: 1.013 (ref 1.005–1.030)
Squamous Epithelial / HPF: NONE SEEN (ref 0–5)
pH: 6 (ref 5.0–8.0)

## 2021-08-13 LAB — BASIC METABOLIC PANEL
Anion gap: 8 (ref 5–15)
BUN: 27 mg/dL — ABNORMAL HIGH (ref 8–23)
CO2: 24 mmol/L (ref 22–32)
Calcium: 9 mg/dL (ref 8.9–10.3)
Chloride: 103 mmol/L (ref 98–111)
Creatinine, Ser: 0.88 mg/dL (ref 0.44–1.00)
GFR, Estimated: >60 mL/min (ref >60)
Glucose, Bld: 111 mg/dL — ABNORMAL HIGH (ref 70–99)
Potassium: 4.4 mmol/L (ref 3.5–5.1)
Sodium: 135 mmol/L (ref 135–145)

## 2021-08-13 LAB — CBC WITH DIFFERENTIAL/PLATELET
Abs Immature Granulocytes: 0.02 10*3/uL (ref 0.00–0.07)
Basophils Absolute: 0 10*3/uL (ref 0.0–0.1)
Basophils Relative: 1 %
Eosinophils Absolute: 0.1 10*3/uL (ref 0.0–0.5)
Eosinophils Relative: 1 %
HCT: 36.1 % (ref 36.0–46.0)
Hemoglobin: 12 g/dL (ref 12.0–15.0)
Immature Granulocytes: 0 %
Lymphocytes Relative: 52 %
Lymphs Abs: 3.4 10*3/uL (ref 0.7–4.0)
MCH: 33.4 pg (ref 26.0–34.0)
MCHC: 33.2 g/dL (ref 30.0–36.0)
MCV: 100.6 fL — ABNORMAL HIGH (ref 80.0–100.0)
Monocytes Absolute: 0.6 10*3/uL (ref 0.1–1.0)
Monocytes Relative: 9 %
Neutro Abs: 2.4 10*3/uL (ref 1.7–7.7)
Neutrophils Relative %: 37 %
Platelets: 293 10*3/uL (ref 150–400)
RBC: 3.59 MIL/uL — ABNORMAL LOW (ref 3.87–5.11)
RDW: 13.2 % (ref 11.5–15.5)
WBC: 6.5 10*3/uL (ref 4.0–10.5)
nRBC: 0 % (ref 0.0–0.2)

## 2021-08-13 MED ORDER — ACETAMINOPHEN 500 MG PO TABS
1000.0000 mg | ORAL_TABLET | Freq: Once | ORAL | Status: AC
  Administered 2021-08-13: 1000 mg via ORAL
  Filled 2021-08-13: qty 2

## 2021-08-13 NOTE — ED Provider Notes (Signed)
[facility] Provider Note    Event Date/Time   First MD Initiated Contact with Patient 08/13/21 [phone]     (approximate)   History   Fall   HPI  [name] [name] is a 88 y.o. female with a history of hyperlipidemia, overactive bladder who comes to the ED after a fall.  Patient comes [facility], normally uses a walker to walk.  She denies any preceding chest pain shortness of breath abdominal pain back pain dizziness or headache.  She does not think she passed out or hit her head.  No neck pain or back pain.  Denies dysuria.  No cough.  No fever.  She does complain of pain at the left hip.  Past Medical History:  Diagnosis Date   Allergic rhinitis due to pollen    some shellfish also   Aortic sclerosis 5/12   On echo. no stenosis   Arthritis    Diastolic dysfunction    stress echo otherwise normal 1/04. Repeat 5/11 also negative   History of seasonal allergies    Hx of basal cell carcinoma 1990   multiple sites   Hx of squamous cell carcinoma of skin 2000   R nasolabial fold   Hyperlipidemia    Osteoarthritis, multiple sites    Osteoporosis    Overactive bladder    Urge incontinence    Vaginal odor 04/2018   aerobic vaginitis on One Swab culture        Physical Exam   Triage Vital Signs: ED Triage Vitals  Enc Vitals Group     BP 08/13/21 0625 (!) 148/97     Pulse Rate 08/13/21 0625 85     Resp 08/13/21 0625 20     Temp 08/13/21 0625 97.8 F (36.6 C)     Temp Source 08/13/21 0625 Oral     SpO2 08/13/21 0625 94 %     Weight 08/13/21 0628 125 lb (56.7 kg)     Height 08/13/21 0628 5\' 6"  (1.676 m)     Head Circumference --      Peak Flow --      Pain Score 08/13/21 0628 3     Pain Loc --      Pain Edu? --      Excl. in [place]? --     Most recent vital signs: Vitals:   08/13/21 0625  BP: (!) 148/97  Pulse: 85  Resp: 20  Temp: 97.8 F (36.6 C)  SpO2: 94%     General: Awake, no distress.  CV:  Good peripheral  perfusion.  Regular rate and rhythm Resp:  Normal effort.  Clear to auscultation bilaterally Abd:  No distention.  Mild suprapubic tenderness Other:  Moist mucosa.  Mild tenderness at the left hip, no deformity.  No pain with passive range of motion of the left hip.  Lower extremities otherwise nontender with no deformities.  Patient able to stand and bear weight, able to take small steps with assistance, denies pain with standing and walking.   ED Results / Procedures / Treatments   Labs (all labs ordered are listed, but only abnormal results are displayed) Labs Reviewed  URINALYSIS, ROUTINE W REFLEX MICROSCOPIC  BASIC METABOLIC PANEL  CBC WITH DIFFERENTIAL/PLATELET     EKG  Interpreted by me Normal sinus rhythm rate of 83.  Left axis.  Poor R wave progression.  Nonspecific intraventricular block.  No acute ischemic changes.   RADIOLOGY X-ray left femur reviewed and interpreted by me,  negative for fracture or dislocation.  Radiology report reviewed.    PROCEDURES:  Critical Care performed: No  Procedures   MEDICATIONS ORDERED IN ED: Medications - No data to display   IMPRESSION / MDM / [place] / ED COURSE  I reviewed the triage vital signs and the nursing notes.                              Differential diagnosis includes, but is not limited to, femur fracture, dehydration, electrolyte abnormality, anemia, UTI     Patient presents with left hip pain after likely mechanical fall.  Patient seems somewhat unsure about nature of the fall, so out of abundance of caution, will obtain EKG and labs.  Suprapubic tenderness concerning for possible UTI.    ----------------------------------------- 3:27 PM on 08/26/2021 ----------------------------------------- Late entry note.  Labs unremarkable.        FINAL CLINICAL IMPRESSION(S) / ED DIAGNOSES   Final diagnoses:  Contusion of left hip, initial encounter     Rx / DC Orders   ED Discharge  Orders     None        Note:  This document was prepared using Dragon voice recognition software and may include unintentional dictation errors.   Carrie Mew, MD 08/26/21 1527

## 2021-08-13 NOTE — ED Triage Notes (Signed)
Pt to triage via w/c with no distress noted, brought in by EMS from [facility]; pt reports that she was walking with her walker and fell on the elevator; c/o pain to left thigh; pt reports that she was able to weight bear after fall; pt denies any other c/o or injuries, denies hitting head

## 2021-08-13 NOTE — ED Notes (Signed)
See triage note  presents s/p fall  states she tripped   fell  landed on left hip/leg

## 2021-08-13 NOTE — Discharge Instructions (Addendum)
Your lab test today are okay.  Your x-ray does not show any fractures.  Take Tylenol as needed and follow-up with your primary care doctor for further monitoring of your symptoms.

## 2021-08-18 DIAGNOSIS — K219 Gastro-esophageal reflux disease without esophagitis: Secondary | ICD-10-CM | POA: Diagnosis not present

## 2021-08-18 DIAGNOSIS — J3089 Other allergic rhinitis: Secondary | ICD-10-CM

## 2021-08-18 DIAGNOSIS — R5381 Other malaise: Secondary | ICD-10-CM | POA: Diagnosis not present

## 2021-08-18 DIAGNOSIS — M4716 Other spondylosis with myelopathy, lumbar region: Secondary | ICD-10-CM

## 2021-08-18 DIAGNOSIS — I5043 Acute on chronic combined systolic (congestive) and diastolic (congestive) heart failure: Secondary | ICD-10-CM

## 2021-08-18 DIAGNOSIS — M159 Polyosteoarthritis, unspecified: Secondary | ICD-10-CM | POA: Diagnosis not present

## 2021-08-18 DIAGNOSIS — F0150 Vascular dementia without behavioral disturbance: Secondary | ICD-10-CM | POA: Diagnosis not present

## 2021-08-22 DIAGNOSIS — B351 Tinea unguium: Secondary | ICD-10-CM

## 2021-09-16 DIAGNOSIS — K219 Gastro-esophageal reflux disease without esophagitis: Secondary | ICD-10-CM

## 2021-09-16 DIAGNOSIS — F0150 Vascular dementia without behavioral disturbance: Secondary | ICD-10-CM | POA: Diagnosis not present

## 2021-09-16 DIAGNOSIS — R5381 Other malaise: Secondary | ICD-10-CM | POA: Diagnosis not present

## 2021-09-16 DIAGNOSIS — M159 Polyosteoarthritis, unspecified: Secondary | ICD-10-CM

## 2021-09-16 DIAGNOSIS — I5032 Chronic diastolic (congestive) heart failure: Secondary | ICD-10-CM | POA: Diagnosis not present

## 2021-09-16 DIAGNOSIS — F39 Unspecified mood [affective] disorder: Secondary | ICD-10-CM | POA: Diagnosis not present

## 2021-10-08 DIAGNOSIS — R531 Weakness: Secondary | ICD-10-CM | POA: Diagnosis not present

## 2021-10-08 DIAGNOSIS — I5030 Unspecified diastolic (congestive) heart failure: Secondary | ICD-10-CM | POA: Diagnosis not present

## 2021-10-08 DIAGNOSIS — K219 Gastro-esophageal reflux disease without esophagitis: Secondary | ICD-10-CM

## 2021-10-08 DIAGNOSIS — M1990 Unspecified osteoarthritis, unspecified site: Secondary | ICD-10-CM

## 2021-10-08 DIAGNOSIS — F39 Unspecified mood [affective] disorder: Secondary | ICD-10-CM | POA: Diagnosis not present

## 2021-10-08 DIAGNOSIS — N3946 Mixed incontinence: Secondary | ICD-10-CM

## 2021-10-08 DIAGNOSIS — F0150 Vascular dementia without behavioral disturbance: Secondary | ICD-10-CM | POA: Diagnosis not present

## 2021-11-13 DIAGNOSIS — H409 Unspecified glaucoma: Secondary | ICD-10-CM | POA: Insufficient documentation

## 2021-11-14 DIAGNOSIS — R531 Weakness: Secondary | ICD-10-CM

## 2021-11-14 DIAGNOSIS — N321 Vesicointestinal fistula: Secondary | ICD-10-CM

## 2021-11-14 DIAGNOSIS — M1990 Unspecified osteoarthritis, unspecified site: Secondary | ICD-10-CM | POA: Diagnosis not present

## 2021-11-14 DIAGNOSIS — F0150 Vascular dementia without behavioral disturbance: Secondary | ICD-10-CM | POA: Diagnosis not present

## 2021-11-14 DIAGNOSIS — I5030 Unspecified diastolic (congestive) heart failure: Secondary | ICD-10-CM | POA: Diagnosis not present

## 2021-11-14 DIAGNOSIS — F39 Unspecified mood [affective] disorder: Secondary | ICD-10-CM | POA: Diagnosis not present

## 2021-11-14 DIAGNOSIS — K219 Gastro-esophageal reflux disease without esophagitis: Secondary | ICD-10-CM

## 2021-12-04 DIAGNOSIS — F39 Unspecified mood [affective] disorder: Secondary | ICD-10-CM | POA: Diagnosis not present

## 2021-12-04 DIAGNOSIS — K219 Gastro-esophageal reflux disease without esophagitis: Secondary | ICD-10-CM

## 2021-12-04 DIAGNOSIS — F0150 Vascular dementia without behavioral disturbance: Secondary | ICD-10-CM | POA: Diagnosis not present

## 2021-12-04 DIAGNOSIS — R5381 Other malaise: Secondary | ICD-10-CM | POA: Diagnosis not present

## 2021-12-04 DIAGNOSIS — I5032 Chronic diastolic (congestive) heart failure: Secondary | ICD-10-CM | POA: Diagnosis not present

## 2021-12-04 DIAGNOSIS — M159 Polyosteoarthritis, unspecified: Secondary | ICD-10-CM

## 2021-12-25 DIAGNOSIS — B309 Viral conjunctivitis, unspecified: Secondary | ICD-10-CM

## 2022-01-23 DIAGNOSIS — M25562 Pain in left knee: Secondary | ICD-10-CM | POA: Diagnosis not present

## 2022-01-28 DIAGNOSIS — M25562 Pain in left knee: Secondary | ICD-10-CM

## 2022-02-02 DIAGNOSIS — M1712 Unilateral primary osteoarthritis, left knee: Secondary | ICD-10-CM | POA: Diagnosis not present

## 2022-02-18 DIAGNOSIS — K219 Gastro-esophageal reflux disease without esophagitis: Secondary | ICD-10-CM

## 2022-02-18 DIAGNOSIS — M1990 Unspecified osteoarthritis, unspecified site: Secondary | ICD-10-CM | POA: Diagnosis not present

## 2022-02-18 DIAGNOSIS — I5030 Unspecified diastolic (congestive) heart failure: Secondary | ICD-10-CM | POA: Diagnosis not present

## 2022-02-18 DIAGNOSIS — R531 Weakness: Secondary | ICD-10-CM | POA: Diagnosis not present

## 2022-02-18 DIAGNOSIS — F0150 Vascular dementia without behavioral disturbance: Secondary | ICD-10-CM | POA: Diagnosis not present

## 2022-03-26 ENCOUNTER — Encounter: Payer: Self-pay | Admitting: Student

## 2022-03-26 ENCOUNTER — Non-Acute Institutional Stay (SKILLED_NURSING_FACILITY): Payer: Medicare Other | Admitting: Student

## 2022-03-26 DIAGNOSIS — M1712 Unilateral primary osteoarthritis, left knee: Secondary | ICD-10-CM

## 2022-03-26 DIAGNOSIS — N3941 Urge incontinence: Secondary | ICD-10-CM | POA: Diagnosis not present

## 2022-03-26 DIAGNOSIS — F0150 Vascular dementia without behavioral disturbance: Secondary | ICD-10-CM | POA: Diagnosis not present

## 2022-03-26 NOTE — Progress Notes (Signed)
Location:    [place] of Service:   Nursing Home Provider:  Criss Alvine, MD  Patient Care Team: Venia Carbon, MD as PCP - General (Pediatrics)  Extended Emergency Contact Information Primary Emergency Contact: Dibens,Marcia  [place] Phone: [phone] Relation: Daughter  Code Status:  Full Code Goals of care: Advanced Directive information    08/13/2021    6:31 AM  Advanced Directives  Does Patient Have a Medical Advance Directive? No  Would patient like information on creating a medical advance directive? No - Patient declined     No chief complaint on file.   HPI:  Pt is a 88 y.o. female seen today for an acute visit for increased urinary incontinence. This is a follow up visit. She discussed concern with Dr. Silvio Pate ~1 week ago. At that time, a urinalysis was collected and was negative for infection. Today, she states she has no persistent signs of urinary tract infection. She is on myrbetriq for irritable bladder which was started in march. There was slight improvement, however, they are curious about increasing the dose or other options. She denies other symptoms such as dysuria, nausea, vomiting, fever. States she rarely makes it to the restroom. She drinks 1 cup of coffee in the morning and black tea each night. Denies spicy foods. Low water intake, maybe 30 oz per day.   Her daughter, [name], is at bedside and updated with the plan.    Past Medical History:  Diagnosis Date   Allergic rhinitis due to pollen    some shellfish also   Aortic sclerosis 5/12   On echo. no stenosis   Arthritis    Diastolic dysfunction    stress echo otherwise normal 1/04. Repeat 5/11 also negative   History of seasonal allergies    Hx of basal cell carcinoma 1990   multiple sites   Hx of squamous cell carcinoma of skin 2000   R nasolabial fold   Hyperlipidemia    Osteoarthritis,  multiple sites    Osteoporosis    Overactive bladder    Urge incontinence    Vaginal odor 04/2018   aerobic vaginitis on One Swab culture   Past Surgical History:  Procedure Laterality Date   CYSTOCELE REPAIR  2007   and rectocele   DILATION AND CURETTAGE OF UTERUS     INGUINAL HERNIA REPAIR  2012   left side with mesh   REVERSE SHOULDER ARTHROPLASTY Right 04/27/2017   Procedure: REVERSE SHOULDER ARTHROPLASTY;  Surgeon: Corky Mull, MD;  Location: [facility] ORS;  Service: Orthopedics;  Laterality: Right;   TONSILLECTOMY AND ADENOIDECTOMY     as child   TOTAL HIP ARTHROPLASTY Right 05/23/2015   Procedure: TOTAL HIP ARTHROPLASTY ANTERIOR APPROACH;  Surgeon: Hessie Knows, MD;  Location: [facility] ORS;  Service: Orthopedics;  Laterality: Right;   UMBILICAL HERNIA REPAIR  02/04/06    Allergies  Allergen Reactions   Demerol [Meperidine]     dizzy   Shellfish Allergy    Avelox [Moxifloxacin Hcl In Nacl] Rash    Outpatient Encounter Medications as of 03/26/2022  Medication Sig   acetaminophen (TYLENOL) 650 MG CR tablet Take 650 mg by mouth 3 (three) times daily.   aspirin EC 81 MG tablet Take 81 mg by mouth daily. Swallow whole.   dorzolamide-timolol (COSOPT) 22.3-6.8 MG/ML ophthalmic solution Apply 1 drop to eye 2 (two) times daily.   EPIPEN 2-PAK 0.3 MG/0.3ML SOAJ  injection Inject into the muscle once.   fluticasone (FLONASE) 50 MCG/ACT nasal spray Place 1 spray into both nostrils 2 (two) times a day.   latanoprost (XALATAN) 0.005 % ophthalmic solution Place 1 drop into both eyes at bedtime.   levocetirizine (XYZAL) 5 MG tablet Take 5 mg by mouth every evening.   melatonin 3 MG TABS tablet Take 3 mg by mouth at bedtime.   Multiple Vitamin (MULTIVITAMIN) tablet Take 1 tablet by mouth daily.   omeprazole (PRILOSEC) 20 MG capsule Take 1 capsule (20 mg total) by mouth daily.   polyethylene glycol (MIRALAX / GLYCOLAX) 17 g packet Take 17 g by mouth daily.   Probiotic Product (PROBIOTIC  ADVANCED PO) Take 1 capsule by mouth daily.   Simethicone (GAS-X PO) Take by mouth 2 (two) times daily.   traMADol (ULTRAM) 50 MG tablet TAKE ONE TABLET BY MOUTH AT BEDTIME (Patient taking differently: Take 50 mg by mouth every evening.)   No facility-administered encounter medications on file as of 03/26/2022.    Review of Systems  All other systems reviewed and are negative.   Immunization History  Administered Date(s) Administered   Influenza, High Dose Seasonal PF 04/28/2017, 05/16/2018   Influenza, Seasonal, Injecte, Preservative Fre 05/28/2016   Influenza,inj,Quad PF,6+ Mos 05/25/2013, 05/07/2015   Influenza-Unspecified 04/24/2014   Pneumococcal Conjugate-13 02/07/2014   Pneumococcal Polysaccharide-23 02/15/2012, 03/07/2018   Td 09/12/2002, 02/06/2013   Zoster, Live 02/19/2016   Pertinent  Health Maintenance Due  Topic Date Due   INFLUENZA VACCINE  03/03/2022   DEXA SCAN  Completed      07/06/2021    9:20 PM 07/07/2021    9:35 AM 07/07/2021    8:00 PM 07/08/2021    8:09 AM 08/13/2021    6:31 AM  Fall Risk  Patient Fall Risk Level Moderate fall risk High fall risk High fall risk High fall risk Moderate fall risk   Functional Status Survey:    There were no vitals filed for this visit. There is no height or weight on file to calculate BMI. Physical Exam Constitutional:      Appearance: Normal appearance.  Abdominal:     General: Abdomen is flat.     Palpations: Abdomen is soft.     Comments: No suprapubic tenderness.   Neurological:     Mental Status: She is alert.     Labs reviewed: Recent Labs    07/06/21 0134 07/07/21 0427 08/13/21 0804  NA 130* 135 135  K 4.7 4.0 4.4  CL 101 106 103  CO2 20* 23 24  GLUCOSE 126* 99 111*  BUN 39* 31* 27*  CREATININE 0.99 0.92 0.88  CALCIUM 8.7* 8.0* 9.0   Recent Labs    07/06/21 0134  AST 25  ALT 14  ALKPHOS 80  BILITOT 0.9  PROT 6.3*  ALBUMIN 3.6   Recent Labs    07/06/21 0134 07/07/21 0427  08/13/21 0804  WBC 15.2* 12.1* 6.5  NEUTROABS 8.3*  --  2.4  HGB 11.2* 9.9* 12.0  HCT 34.6* 29.2* 36.1  MCV 103.6* 99.7 100.6*  PLT 298 254 293   Lab Results  Component Value Date   TSH 1.27 02/06/2013   No results found for: "HGBA1C" Lab Results  Component Value Date   CHOL 232 (H) 02/06/2013   HDL 77.50 02/06/2013   LDLDIRECT 138.4 02/06/2013   TRIG 72.0 02/06/2013   CHOLHDL 3 02/06/2013    Significant Diagnostic Results in last 30 days:  No results found.  Assessment/Plan  1. Urge incontinence Patient has had symptoms for months. Most recent urinalysis 1 week ago shows 10,000 Gram positive organisms which is more consistent with bacteruria in the geriatric population. Treatment was deferred and agree with this plan. Discussed with family persistent symptoms. Discussed lifestyle changes such as scheduled toileting, increase daytime water intake as to decrease PM water intake, and decreasing caffeine intake as possible. Will continue to monitor for symptoms. [name] also plans to discuss with nursing to see if there are more absorbant briefs that could be used to prevent patient from changing clothes multiple times per day due to incontinence.  Will increase dose of mirabegron '25mg'$  to '50mg'$  daily to aid with symptoms of urge incontinence. Will follow up with patient in one month for improvement of symptoms. If no improvement, will consider decreasing back to 25 mg per day to prevent anticholinergic effects of urinary retention.   2. Localized osteoarthritis of left knee Stable, pain well-controlled on scheduled tylenol. PRN norco and tramodol. Will discuss at regulatory visit concern for combination of these medications.   3. Vascular dementia without behavioral disturbance [facility]) Patient continues to be alert, oriented and without behavioral concerns. Weight stable. Continue to monitor for decline in condition.    Family/ staff Communication: Discussed plan with daughter at bedside  and nurse.   Labs/tests ordered: Defer labs unless symptoms develop  Tomasa Rand, MD, [place] [phone]

## 2022-04-13 DIAGNOSIS — K219 Gastro-esophageal reflux disease without esophagitis: Secondary | ICD-10-CM

## 2022-04-13 DIAGNOSIS — N3281 Overactive bladder: Secondary | ICD-10-CM

## 2022-04-13 DIAGNOSIS — M159 Polyosteoarthritis, unspecified: Secondary | ICD-10-CM

## 2022-04-13 DIAGNOSIS — I5032 Chronic diastolic (congestive) heart failure: Secondary | ICD-10-CM

## 2022-04-13 DIAGNOSIS — F0150 Vascular dementia without behavioral disturbance: Secondary | ICD-10-CM

## 2022-05-04 ENCOUNTER — Telehealth: Payer: Self-pay | Admitting: Student

## 2022-05-04 NOTE — Telephone Encounter (Signed)
Called Daughter Robinette Haines [phone] and left a voicemail. Plan to discuss next steps regarding recent dermatology biopsy positive for squamous cell carcinoma. Will try again at a later date.   Tomasa Rand, MD, [facility] [phone]

## 2022-05-08 ENCOUNTER — Telehealth: Payer: Self-pay | Admitting: Student

## 2022-05-08 NOTE — Telephone Encounter (Signed)
Called - no voice mailbox set up. Will call again at a later date.   Tomasa Rand, MD, [facility] [phone]

## 2022-05-10 ENCOUNTER — Telehealth: Payer: Self-pay | Admitting: Nurse Practitioner

## 2022-05-10 NOTE — Telephone Encounter (Signed)
Called and reported urine culture showed no growth, the patient is afebrile, denied dysuria. Reported the patient is in her usual state of health.

## 2022-05-27 ENCOUNTER — Encounter: Payer: Self-pay | Admitting: Student

## 2022-05-27 ENCOUNTER — Non-Acute Institutional Stay (SKILLED_NURSING_FACILITY): Payer: Medicare Other | Admitting: Student

## 2022-05-27 DIAGNOSIS — R0781 Pleurodynia: Secondary | ICD-10-CM

## 2022-05-27 NOTE — Progress Notes (Signed)
Location:  Other [facility]) Nursing Home Room Number: 202-A Place of Service:  SNF [phone]) Provider:  Dewayne Shorter, MD  Patient Care Team: Dewayne Shorter, MD as PCP - General (Family Medicine)  Extended Emergency Contact Information Primary Emergency Contact: Dibens,Marcia  [place] Phone: [phone] Relation: Daughter  Code Status:  Full Code  Goals of care: Advanced Directive information    05/27/2022   10:04 AM  Advanced Directives  Does Patient Have a Medical Advance Directive? No  Does patient want to make changes to medical advance directive? No - Patient declined     Chief Complaint  Patient presents with   Acute Visit    Fall. Vitals and medications are a reflection of [facility] system, [facility]      HPI:  Pt is a 88 y.o. female seen today for an acute visit for Fall this morning. No LOC. She hit her left chest and feels like she hit it hard. She may have hit the shower chair -- her hed was in the shower. She denies symptoms at the time of fall. She has had neuro checks, but other than BP her VS have been stable. She has pain with deep inspiration. Took tramadol and norco today. States she continues to have issues with urination despite increased dose of the medication.    Past Medical History:  Diagnosis Date   Allergic rhinitis due to pollen    some shellfish also   Aortic sclerosis 5/12   On echo. no stenosis   Arthritis    Diastolic dysfunction    stress echo otherwise normal 1/04. Repeat 5/11 also negative   History of seasonal allergies    Hx of basal cell carcinoma 1990   multiple sites   Hx of squamous cell carcinoma of skin 2000   R nasolabial fold   Hyperlipidemia    Osteoarthritis, multiple sites    Osteoporosis    Overactive bladder    Urge incontinence    Vaginal odor 04/2018   aerobic vaginitis on One Swab culture   Past Surgical History:  Procedure Laterality Date   CYSTOCELE REPAIR  2007    and rectocele   DILATION AND CURETTAGE OF UTERUS     INGUINAL HERNIA REPAIR  2012   left side with mesh   REVERSE SHOULDER ARTHROPLASTY Right 04/27/2017   Procedure: REVERSE SHOULDER ARTHROPLASTY;  Surgeon: Corky Mull, MD;  Location: [facility] ORS;  Service: Orthopedics;  Laterality: Right;   TONSILLECTOMY AND ADENOIDECTOMY     as child   TOTAL HIP ARTHROPLASTY Right 05/23/2015   Procedure: TOTAL HIP ARTHROPLASTY ANTERIOR APPROACH;  Surgeon: Hessie Knows, MD;  Location: [facility] ORS;  Service: Orthopedics;  Laterality: Right;   UMBILICAL HERNIA REPAIR  02/04/06    Allergies  Allergen Reactions   Demerol [Meperidine]     dizzy   Shellfish Allergy    Avelox [Moxifloxacin Hcl In Nacl] Rash    Outpatient Encounter Medications as of 05/27/2022  Medication Sig   acetaminophen (TYLENOL) 325 MG tablet Take 650 mg by mouth every 6 (six) hours as needed.   aspirin EC 81 MG tablet Take 81 mg by mouth daily. Swallow whole.   diclofenac Sodium (VOLTAREN) 1 % GEL Apply 4 g topically 3 (three) times daily. To left knee see other listing for shoulder use   diclofenac Sodium (VOLTAREN) 1 % GEL Apply 2 g topically every 8 (eight) hours as needed (to right shoulder).   dorzolamide-timolol (COSOPT) 22.3-6.8 MG/ML  ophthalmic solution Apply 1 drop to eye 2 (two) times daily.   EPIPEN 2-PAK 0.3 MG/0.3ML SOAJ injection Inject into the muscle as needed for anaphylaxis (or allergic reaction).   fluorouracil (EFUDEX) 5 % cream as directed. Apply to left cheek lesion topically every day and evening shift 14 days on and 7 days off for Skin Cancer   fluticasone (FLONASE) 50 MCG/ACT nasal spray Place 1 spray into both nostrils 2 (two) times a day.   HYDROcodone-acetaminophen (NORCO/VICODIN) 5-325 MG tablet Take 0.5 tablets by mouth every 4 (four) hours as needed.   latanoprost (XALATAN) 0.005 % ophthalmic solution Place 1 drop into both eyes at bedtime.   levocetirizine (XYZAL) 5 MG tablet Take 5 mg by mouth every  evening.   melatonin 3 MG TABS tablet Take 3 mg by mouth at bedtime.   mirabegron ER (MYRBETRIQ) 50 MG TB24 tablet Take 50 mg by mouth daily.   Multiple Vitamin (MULTIVITAMIN) tablet Take 1 tablet by mouth daily.   nystatin [facility]) powder Apply to rash under left breast topically every 12 hours as needed for skin breakdown twice daily   omeprazole (PRILOSEC) 20 MG capsule Take 1 capsule (20 mg total) by mouth daily.   polyethylene glycol (MIRALAX / GLYCOLAX) 17 g packet Take 17 g by mouth daily.   Probiotic Product (PROBIOTIC ADVANCED PO) Take 1 capsule by mouth daily.   Simethicone (GAS-X PO) Take by mouth 2 (two) times daily.   traMADol (ULTRAM) 50 MG tablet TAKE ONE TABLET BY MOUTH AT BEDTIME   traMADol (ULTRAM) 50 MG tablet Take 50 mg by mouth every 8 (eight) hours as needed. See bedtime listing as well   [DISCONTINUED] acetaminophen (TYLENOL) 650 MG CR tablet Take 650 mg by mouth 3 (three) times daily.   [DISCONTINUED] MYRBETRIQ 25 MG TB24 tablet Take 50 mg by mouth daily.   No facility-administered encounter medications on file as of 05/27/2022.    Review of Systems  All other systems reviewed and are negative.   Immunization History  Administered Date(s) Administered   Influenza, High Dose Seasonal PF 04/28/2017, 05/16/2018, 05/19/2022   Influenza, Seasonal, Injecte, Preservative Fre 05/28/2016   Influenza,inj,Quad PF,6+ Mos 05/25/2013, 05/07/2015   Influenza-Unspecified 04/24/2014   Moderna SARS-COV2 Booster Vaccination 06/14/2020, 12/20/2020, 12/30/2021   Moderna Sars-Covid-2 Vaccination 08/14/2019, 09/11/2019   Pfizer Covid-19 Vaccine Bivalent Booster 63yr & up 04/25/2021   Pneumococcal Conjugate-13 02/07/2014   Pneumococcal Polysaccharide-23 02/15/2012, 03/07/2018   Td 09/12/2002, 02/06/2013   Zoster, Live 02/19/2016   Pertinent  Health Maintenance Due  Topic Date Due   INFLUENZA VACCINE  Completed   DEXA SCAN  Completed      07/06/2021    9:20 PM 07/07/2021     9:35 AM 07/07/2021    8:00 PM 07/08/2021    8:09 AM 08/13/2021    6:31 AM  Fall Risk  Patient Fall Risk Level Moderate fall risk High fall risk High fall risk High fall risk Moderate fall risk   Functional Status Survey:    Vitals:   05/27/22 0903  BP: 132/76  Pulse: 84  Weight: 149 lb 14.4 oz (68 kg)  Height: '5\' 6"'$  (1.676 m)   Body mass index is 24.19 kg/m. Physical Exam Cardiovascular:     Rate and Rhythm: Normal rate.     Pulses: Normal pulses.  Pulmonary:     Breath sounds: Normal breath sounds.     Comments: Inspiration limited secondary to pain Skin:    General: Skin is warm and dry.  Neurological:     Mental Status: She is alert. Mental status is at baseline.     Labs reviewed: Recent Labs    07/06/21 0134 07/07/21 0427 08/13/21 0804  NA 130* 135 135  K 4.7 4.0 4.4  CL 101 106 103  CO2 20* 23 24  GLUCOSE 126* 99 111*  BUN 39* 31* 27*  CREATININE 0.99 0.92 0.88  CALCIUM 8.7* 8.0* 9.0   Recent Labs    07/06/21 0134  AST 25  ALT 14  ALKPHOS 80  BILITOT 0.9  PROT 6.3*  ALBUMIN 3.6   Recent Labs    07/06/21 0134 07/07/21 0427 08/13/21 0804  WBC 15.2* 12.1* 6.5  NEUTROABS 8.3*  --  2.4  HGB 11.2* 9.9* 12.0  HCT 34.6* 29.2* 36.1  MCV 103.6* 99.7 100.6*  PLT 298 254 293   Lab Results  Component Value Date   TSH 1.27 02/06/2013   No results found for: "HGBA1C" Lab Results  Component Value Date   CHOL 232 (H) 02/06/2013   HDL 77.50 02/06/2013   LDLDIRECT 138.4 02/06/2013   TRIG 72.0 02/06/2013   CHOLHDL 3 02/06/2013    Significant Diagnostic Results in last 30 days:  No results found.  Assessment/Plan 1. Rib pain on left side Patient with left rib pain due to a fall last night. Neuro checks with nursing have been negative and will continue for 72 hours. Will only continue norco for pain, hold future dosing of tramadol. Discussed importance of deep insipation-- ordered incentive spirometer to bedside. CXR AP/Lat to assess for rib  fracture-- although this is sometimes inconclusive for fracture and discussed with patient. Patient has some difficulty remembering all of the details of the fall, will plan for ECG today as well to assess for any arrythmia.   Family/ staff Communication: nursing  Labs/tests ordered:  none  Tomasa Rand, MD, [place] [phone]

## 2022-06-02 ENCOUNTER — Other Ambulatory Visit: Payer: Self-pay | Admitting: Nurse Practitioner

## 2022-06-02 MED ORDER — TRAMADOL HCL 50 MG PO TABS: ORAL_TABLET | ORAL | 0 refills | Status: DC

## 2022-06-04 ENCOUNTER — Other Ambulatory Visit: Payer: Self-pay | Admitting: Nurse Practitioner

## 2022-06-04 MED ORDER — TRAMADOL HCL 50 MG PO TABS: ORAL_TABLET | ORAL | 0 refills | Status: DC

## 2022-06-15 ENCOUNTER — Non-Acute Institutional Stay (SKILLED_NURSING_FACILITY): Payer: Medicare Other | Admitting: Student

## 2022-06-15 ENCOUNTER — Encounter: Payer: Self-pay | Admitting: Student

## 2022-06-15 DIAGNOSIS — I872 Venous insufficiency (chronic) (peripheral): Secondary | ICD-10-CM

## 2022-06-15 DIAGNOSIS — M150 Primary generalized (osteo)arthritis: Secondary | ICD-10-CM

## 2022-06-15 DIAGNOSIS — I700 Atherosclerosis of aorta: Secondary | ICD-10-CM

## 2022-06-15 DIAGNOSIS — K219 Gastro-esophageal reflux disease without esophagitis: Secondary | ICD-10-CM

## 2022-06-15 DIAGNOSIS — I5032 Chronic diastolic (congestive) heart failure: Secondary | ICD-10-CM

## 2022-06-15 DIAGNOSIS — F0150 Vascular dementia without behavioral disturbance: Secondary | ICD-10-CM

## 2022-06-15 DIAGNOSIS — K5909 Other constipation: Secondary | ICD-10-CM

## 2022-06-15 DIAGNOSIS — F5101 Primary insomnia: Secondary | ICD-10-CM

## 2022-06-15 DIAGNOSIS — E441 Mild protein-calorie malnutrition: Secondary | ICD-10-CM

## 2022-06-15 DIAGNOSIS — J301 Allergic rhinitis due to pollen: Secondary | ICD-10-CM

## 2022-06-15 DIAGNOSIS — N3941 Urge incontinence: Secondary | ICD-10-CM

## 2022-06-15 DIAGNOSIS — M159 Polyosteoarthritis, unspecified: Secondary | ICD-10-CM

## 2022-06-15 NOTE — Progress Notes (Unsigned)
Location:  Other [facility] Room Number: [place] [address] Place of Service:  SNF [phone]) Provider:  Dr. Amada Kingfisher, MD  Patient Care Team: Dewayne Shorter, MD as PCP - General [facility] Medicine)  Extended Emergency Contact Information Primary Emergency Contact: Dibens,Marcia  [place] Phone: [phone] Relation: Daughter  Code Status:  Full Goals of care: Advanced Directive information    05/27/2022   10:04 AM  Advanced Directives  Does Patient Have a Medical Advance Directive? No  Does patient want to make changes to medical advance directive? No - Patient declined     Chief Complaint  Patient presents with   Medical Management of Chronic Issues    Medical Management of Chronic Issues.     HPI:  Pt is a 88 y.o. female seen today for medical management of chronic diseases.  Patient states she is doing well except for the continued chronic overactive bladder. Medication only mildly improves her symptoms.   She is oriented to place and situation, howeer, not the date or year. She gives her birthday and describes her family structure.   She denies chest pain, shortness of breath, diarrhea, constipation.    Past Medical History:  Diagnosis Date   Allergic rhinitis due to pollen    some shellfish also   Aortic sclerosis 5/12   On echo. no stenosis   Arthritis    Diastolic dysfunction    stress echo otherwise normal 1/04. Repeat 5/11 also negative   History of seasonal allergies    Hx of basal cell carcinoma 1990   multiple sites   Hx of squamous cell carcinoma of skin 2000   R nasolabial fold   Hyperlipidemia    Osteoarthritis, multiple sites    Osteoporosis    Overactive bladder    Urge incontinence    Vaginal odor 04/2018   aerobic vaginitis on One Swab culture   Past Surgical History:  Procedure Laterality Date   CYSTOCELE REPAIR  2007   and rectocele   DILATION AND CURETTAGE OF UTERUS     INGUINAL  HERNIA REPAIR  2012   left side with mesh   REVERSE SHOULDER ARTHROPLASTY Right 04/27/2017   Procedure: REVERSE SHOULDER ARTHROPLASTY;  Surgeon: Corky Mull, MD;  Location: [facility] ORS;  Service: Orthopedics;  Laterality: Right;   TONSILLECTOMY AND ADENOIDECTOMY     as child   TOTAL HIP ARTHROPLASTY Right 05/23/2015   Procedure: TOTAL HIP ARTHROPLASTY ANTERIOR APPROACH;  Surgeon: Hessie Knows, MD;  Location: [facility] ORS;  Service: Orthopedics;  Laterality: Right;   UMBILICAL HERNIA REPAIR  02/04/06    Allergies  Allergen Reactions   Demerol [Meperidine]     dizzy   Shellfish Allergy    Avelox [Moxifloxacin Hcl In Nacl] Rash    Outpatient Encounter Medications as of 06/15/2022  Medication Sig   acetaminophen (TYLENOL) 325 MG tablet Take 650 mg by mouth every 6 (six) hours as needed.   aspirin EC 81 MG tablet Take 81 mg by mouth daily. Swallow whole.   diclofenac Sodium (VOLTAREN) 1 % GEL Apply 4 g topically 3 (three) times daily. To left knee see other listing for shoulder use   diclofenac Sodium (VOLTAREN) 1 % GEL Apply 2 g topically every 8 (eight) hours as needed (to right shoulder).   dorzolamide-timolol (COSOPT) 22.3-6.8 MG/ML ophthalmic solution Apply 1 drop to eye 2 (two) times daily.   EPIPEN 2-PAK 0.3 MG/0.3ML SOAJ injection Inject into the muscle as needed for anaphylaxis (  or allergic reaction).   fluorouracil (EFUDEX) 5 % cream as directed. Apply to left cheek lesion topically every day and evening shift 14 days on and 7 days off for Skin Cancer   fluticasone (FLONASE) 50 MCG/ACT nasal spray Place 1 spray into both nostrils 2 (two) times a day.   latanoprost (XALATAN) 0.005 % ophthalmic solution Place 1 drop into both eyes at bedtime.   levocetirizine (XYZAL) 5 MG tablet Take 5 mg by mouth every evening.   melatonin 3 MG TABS tablet Take 3 mg by mouth at bedtime.   mirabegron ER (MYRBETRIQ) 50 MG TB24 tablet Take 50 mg by mouth daily.   Multiple Vitamin (MULTIVITAMIN) tablet  Take 1 tablet by mouth daily.   nystatin [facility]) powder Apply to rash under left breast topically every 12 hours as needed for skin breakdown twice daily   omeprazole (PRILOSEC) 20 MG capsule Take 1 capsule (20 mg total) by mouth daily.   polyethylene glycol (MIRALAX / GLYCOLAX) 17 g packet Take 17 g by mouth daily.   Probiotic Product (PROBIOTIC ADVANCED PO) Take 1 capsule by mouth daily.   Simethicone (GAS-X PO) Take by mouth 2 (two) times daily.   traMADol (ULTRAM) 50 MG tablet One tablet q hs and every 8 hours as needed   No facility-administered encounter medications on file as of 06/15/2022.    Review of Systems  All other systems reviewed and are negative.   Immunization History  Administered Date(s) Administered   Influenza, High Dose Seasonal PF 04/28/2017, 05/16/2018, 05/19/2022   Influenza, Seasonal, Injecte, Preservative Fre 05/28/2016   Influenza,inj,Quad PF,6+ Mos 05/25/2013, 05/07/2015   Influenza-Unspecified 04/24/2014   Moderna SARS-COV2 Booster Vaccination 06/14/2020, 12/20/2020, 12/30/2021   Moderna Sars-Covid-2 Vaccination 08/14/2019, 09/11/2019   Pfizer Covid-19 Vaccine Bivalent Booster 74yr & up 04/25/2021   Pneumococcal Conjugate-13 02/07/2014   Pneumococcal Polysaccharide-23 02/15/2012, 03/07/2018   Td 09/12/2002, 02/06/2013   Zoster, Live 02/19/2016   Pertinent  Health Maintenance Due  Topic Date Due   INFLUENZA VACCINE  Completed   DEXA SCAN  Completed      07/06/2021    9:20 PM 07/07/2021    9:35 AM 07/07/2021    8:00 PM 07/08/2021    8:09 AM 08/13/2021    6:31 AM  Fall Risk  Patient Fall Risk Level Moderate fall risk High fall risk High fall risk High fall risk Moderate fall risk   Functional Status Survey:    Vitals:   06/15/22 1033  BP: (!) 102/54  Pulse: 81  Resp: 18  Temp: 98.2 F (36.8 C)  Weight: 150 lb 3.2 oz (68.1 kg)  Height: '5\' 6"'$  (1.676 m)   Body mass index is 24.24 kg/m. Physical Exam Constitutional:      Appearance:  Normal appearance.  Cardiovascular:     Rate and Rhythm: Normal rate.     Pulses: Normal pulses.  Pulmonary:     Effort: Pulmonary effort is normal.     Breath sounds: Normal breath sounds.  Abdominal:     General: Abdomen is flat.     Palpations: Abdomen is soft.  Skin:    General: Skin is warm and dry.  Neurological:     General: No focal deficit present.     Mental Status: She is alert. Mental status is at baseline.     Labs reviewed: Recent Labs    07/06/21 0134 07/07/21 0427 08/13/21 0804  NA 130* 135 135  K 4.7 4.0 4.4  CL 101 106 103  CO2  20* 23 24  GLUCOSE 126* 99 111*  BUN 39* 31* 27*  CREATININE 0.99 0.92 0.88  CALCIUM 8.7* 8.0* 9.0   Recent Labs    07/06/21 0134  AST 25  ALT 14  ALKPHOS 80  BILITOT 0.9  PROT 6.3*  ALBUMIN 3.6   Recent Labs    07/06/21 0134 07/07/21 0427 08/13/21 0804  WBC 15.2* 12.1* 6.5  NEUTROABS 8.3*  --  2.4  HGB 11.2* 9.9* 12.0  HCT 34.6* 29.2* 36.1  MCV 103.6* 99.7 100.6*  PLT 298 254 293   Lab Results  Component Value Date   TSH 1.27 02/06/2013   No results found for: "HGBA1C" Lab Results  Component Value Date   CHOL 232 (H) 02/06/2013   HDL 77.50 02/06/2013   LDLDIRECT 138.4 02/06/2013   TRIG 72.0 02/06/2013   CHOLHDL 3 02/06/2013    Significant Diagnostic Results in last 30 days:  No results found.  Assessment/Plan Vascular dementia without behavioral disturbance [facility]) Patient maintains significant independence continues to feed herself indendently, ambulating with a rollator, continent of urine and stool. Weight has been stable for the last year.   Chronic diastolic heart failure ([place]) Patient appears to be euvolemic on exam. Minimal swelling bilateral lower extremities. Continue to monitor for weight gain or increase swelling.   Atherosclerosis of aorta (HCC) BP normal. Continue ASA 81 mg.   Peripheral venous insufficiency No swelling at this time. Continue compression as tolerated.   Seasonal  allergic rhinitis due to pollen Patient has persistent allergy symptoms. Continue Xyzal 5 mg nightly.   Chronic constipation Bowel movements normal with PRN miralax and gas controlled with simethicone.   Gastroesophageal reflux disease without esophagitis Patient's symptoms well-controlled with daily 20 mg. She is interested in dose reduction. Will plan for 3 weeks with medication every other day then discontinue. CTM for symptoms.   Primary osteoarthritis involving multiple joints Patient with chronic pain in her joints. Continue PRN tylenol, tramadol, and diclofenac gel.   Urge incontinence Patient's symptoms are difficult to manage even with current dose if mirabegron 50 mg. Will continue at this dose at this time and continue discussions regarding dose reduction.   Primary insomnia Improved with scheduled melatonin 3 mg nightly. Continue.   Malnutrition of mild degree (HCC) Weight is stable at this time. CTM.     Family/ staff Communication: nursing  Labs/tests ordered:  none, reviewed all labs outlined above.   Tomasa Rand, MD, [facility] [phone]

## 2022-07-20 ENCOUNTER — Non-Acute Institutional Stay (SKILLED_NURSING_FACILITY): Payer: Medicare Other | Admitting: Student

## 2022-07-20 ENCOUNTER — Encounter: Payer: Self-pay | Admitting: Student

## 2022-07-20 DIAGNOSIS — F0390 Unspecified dementia without behavioral disturbance: Secondary | ICD-10-CM | POA: Insufficient documentation

## 2022-07-20 DIAGNOSIS — F03B0 Unspecified dementia, moderate, without behavioral disturbance, psychotic disturbance, mood disturbance, and anxiety: Secondary | ICD-10-CM

## 2022-07-20 NOTE — Progress Notes (Signed)
Location:  Other [place].  Nursing Home Room Number: [place] of Service:  SNF [phone]) Provider:  Dr. Amada Kingfisher, MD  Patient Care Team: Dewayne Shorter, MD as PCP - General [facility] Medicine)  Extended Emergency Contact Information Primary Emergency Contact: Dibens,Marcia  [place] Phone: [phone] Relation: Daughter  Code Status:  Full Code.  Goals of care: Advanced Directive information    07/20/2022   11:29 AM  Advanced Directives  Does Patient Have a Medical Advance Directive? No  Does patient want to make changes to medical advance directive? No - Patient declined     Chief Complaint  Patient presents with   Acute Visit    Change in Behavior.     HPI:  Pt is a 88 y.o. female seen today for an acute visit for concern of change in behavior. Note left by weekend nurse states that patient has been going to different halls in the building rather than her own. Nurse also says that patient stood and walked during church and was distracting.  Patient states she has been more active because she is "trying to get more exercise." Patient gives her name, DOB, denies n/v/d/dysuria.   Nursing gives further history that patient has had to have a wander guard before because she tried to leave the healthcare center a few years ago.    Past Medical History:  Diagnosis Date   Allergic rhinitis due to pollen    some shellfish also   Aortic sclerosis 5/12   On echo. no stenosis   Arthritis    Diastolic dysfunction    stress echo otherwise normal 1/04. Repeat 5/11 also negative   History of seasonal allergies    Hx of basal cell carcinoma 1990   multiple sites   Hx of squamous cell carcinoma of skin 2000   R nasolabial fold   Hyperlipidemia    Osteoarthritis, multiple sites    Osteoporosis    Overactive bladder    Urge incontinence    Vaginal odor 04/2018   aerobic vaginitis on One Swab culture   Past  Surgical History:  Procedure Laterality Date   CYSTOCELE REPAIR  2007   and rectocele   DILATION AND CURETTAGE OF UTERUS     INGUINAL HERNIA REPAIR  2012   left side with mesh   REVERSE SHOULDER ARTHROPLASTY Right 04/27/2017   Procedure: REVERSE SHOULDER ARTHROPLASTY;  Surgeon: Corky Mull, MD;  Location: [facility] ORS;  Service: Orthopedics;  Laterality: Right;   TONSILLECTOMY AND ADENOIDECTOMY     as child   TOTAL HIP ARTHROPLASTY Right 05/23/2015   Procedure: TOTAL HIP ARTHROPLASTY ANTERIOR APPROACH;  Surgeon: Hessie Knows, MD;  Location: [facility] ORS;  Service: Orthopedics;  Laterality: Right;   UMBILICAL HERNIA REPAIR  02/04/06    Allergies  Allergen Reactions   Cat Hair Extract    Demerol [Meperidine]     dizzy   Dust Mite Extract    Shellfish Allergy    Avelox [Moxifloxacin Hcl In Nacl] Rash    Outpatient Encounter Medications as of 07/20/2022  Medication Sig   acetaminophen (TYLENOL) 325 MG tablet Take 650 mg by mouth every 6 (six) hours as needed.   aspirin EC 81 MG tablet Take 81 mg by mouth daily. Swallow whole.   diclofenac Sodium (VOLTAREN) 1 % GEL Apply 4 g topically 3 (three) times daily. To left knee see other listing for shoulder use   diclofenac Sodium (VOLTAREN) 1 % GEL Apply  2 g topically every 8 (eight) hours as needed (to right shoulder).   dorzolamide-timolol (COSOPT) 22.3-6.8 MG/ML ophthalmic solution Apply 1 drop to eye 2 (two) times daily.   EPIPEN 2-PAK 0.3 MG/0.3ML SOAJ injection Inject into the muscle as needed for anaphylaxis (or allergic reaction).   fluorouracil (EFUDEX) 5 % cream as directed. Apply to left cheek lesion topically every day and evening shift 14 days on and 7 days off for Skin Cancer   fluticasone (FLONASE) 50 MCG/ACT nasal spray Place 1 spray into both nostrils 2 (two) times a day.   latanoprost (XALATAN) 0.005 % ophthalmic solution Place 1 drop into both eyes at bedtime.   levocetirizine (XYZAL) 5 MG tablet Take 5 mg by mouth every  evening.   melatonin 3 MG TABS tablet Take 3 mg by mouth at bedtime.   mirabegron ER (MYRBETRIQ) 50 MG TB24 tablet Take 50 mg by mouth daily.   Multiple Vitamin (MULTIVITAMIN) tablet Take 1 tablet by mouth daily.   nystatin [facility]) powder Apply to rash under left breast topically every 12 hours as needed for skin breakdown twice daily   polyethylene glycol (MIRALAX / GLYCOLAX) 17 g packet Take 17 g by mouth daily.   Probiotic Product (PROBIOTIC ADVANCED PO) Take 1 capsule by mouth daily.   Simethicone (GAS-X PO) Take by mouth 2 (two) times daily.   traMADol (ULTRAM) 50 MG tablet One tablet q hs and every 8 hours as needed   [DISCONTINUED] omeprazole (PRILOSEC) 20 MG capsule Take 1 capsule (20 mg total) by mouth daily.   No facility-administered encounter medications on file as of 07/20/2022.    Review of Systems  All other systems reviewed and are negative.   Immunization History  Administered Date(s) Administered   Influenza, High Dose Seasonal PF 04/28/2017, 05/16/2018, 05/19/2022   Influenza, Seasonal, Injecte, Preservative Fre 05/28/2016   Influenza,inj,Quad PF,6+ Mos 05/25/2013, 05/07/2015   Influenza-Unspecified 04/24/2014   Moderna SARS-COV2 Booster Vaccination 06/14/2020, 12/20/2020, 12/30/2021   Moderna Sars-Covid-2 Vaccination 08/14/2019, 09/11/2019, 06/12/2022   Pfizer Covid-19 Vaccine Bivalent Booster 80yr & up 04/25/2021   Pneumococcal Conjugate-13 02/07/2014   Pneumococcal Polysaccharide-23 02/15/2012, 03/07/2018   Td 09/12/2002, 02/06/2013   Zoster, Live 02/19/2016   Pertinent  Health Maintenance Due  Topic Date Due   INFLUENZA VACCINE  Completed   DEXA SCAN  Completed      07/06/2021    9:20 PM 07/07/2021    9:35 AM 07/07/2021    8:00 PM 07/08/2021    8:09 AM 08/13/2021    6:31 AM  Fall Risk  Patient Fall Risk Level Moderate fall risk High fall risk High fall risk High fall risk Moderate fall risk   Functional Status Survey:    Vitals:   07/20/22 1121   BP: 136/74  Pulse: 74  Resp: 18  Temp: 97.6 F (36.4 C)  SpO2: 96%  Weight: 145 lb 9.6 oz (66 kg)  Height: '5\' 6"'$  (1.676 m)   Body mass index is 23.5 kg/m. Physical Exam Constitutional:      Appearance: Normal appearance.  Cardiovascular:     Rate and Rhythm: Normal rate.     Pulses: Normal pulses.  Pulmonary:     Effort: Pulmonary effort is normal.     Breath sounds: Normal breath sounds.  Abdominal:     General: Abdomen is flat. Bowel sounds are normal.     Palpations: Abdomen is soft.     Tenderness: There is no abdominal tenderness.  Skin:    General: Skin  is warm and dry.  Neurological:     Mental Status: She is alert and oriented to person, place, and time. Mental status is at baseline.     Labs reviewed: Recent Labs    08/13/21 0804  NA 135  K 4.4  CL 103  CO2 24  GLUCOSE 111*  BUN 27*  CREATININE 0.88  CALCIUM 9.0   No results for input(s): "AST", "ALT", "ALKPHOS", "BILITOT", "PROT", "ALBUMIN" in the last 8760 hours. Recent Labs    08/13/21 0804  WBC 6.5  NEUTROABS 2.4  HGB 12.0  HCT 36.1  MCV 100.6*  PLT 293   Lab Results  Component Value Date   TSH 1.27 02/06/2013   No results found for: "HGBA1C" Lab Results  Component Value Date   CHOL 232 (H) 02/06/2013   HDL 77.50 02/06/2013   LDLDIRECT 138.4 02/06/2013   TRIG 72.0 02/06/2013   CHOLHDL 3 02/06/2013    Significant Diagnostic Results in last 30 days:  No results found.  Assessment/Plan 1. Moderate dementia without behavioral disturbance, psychotic disturbance, mood disturbance, or anxiety, unspecified dementia type ([place]) Patient with increased activity, however, not a danger to her self or others. Discussed importance of redirection. Discussed concern for increased risk of urinary retention, PVR 28m today. Denies symptoms of UTI and is oriented. Nursing staff can consider replacing wander guard for patient safety.    Family/ staff Communication: nursing  Labs/tests ordered:   none

## 2022-07-31 ENCOUNTER — Encounter: Payer: Self-pay | Admitting: Student

## 2022-07-31 ENCOUNTER — Non-Acute Institutional Stay (SKILLED_NURSING_FACILITY): Payer: Medicare Other | Admitting: Student

## 2022-07-31 DIAGNOSIS — L600 Ingrowing nail: Secondary | ICD-10-CM | POA: Diagnosis not present

## 2022-07-31 NOTE — Progress Notes (Signed)
Location:  Other [place].  Nursing Home Room Number: [place] of Service:  SNF [phone]) Provider:  Dr. Amada Kingfisher, MD  Patient Care Team: Dewayne Shorter, MD as PCP - General [facility] Medicine)  Extended Emergency Contact Information Primary Emergency Contact: Dibens,Marcia  [place] Phone: [phone] Relation: Daughter  Code Status:  Full Code Goals of care: Advanced Directive information    07/20/2022   11:29 AM  Advanced Directives  Does Patient Have a Medical Advance Directive? No  Does patient want to make changes to medical advance directive? No - Patient declined     Chief Complaint  Patient presents with   Acute Visit    Toe Pain    HPI:  Pt is a 88 y.o. female seen today for an acute visit for toenail pain. Nursing cut toenails yesterday and she had some increased tenderness and unable to wear her shoe on the right foot. Patient states It hurts, but not that bad.    Past Medical History:  Diagnosis Date   Allergic rhinitis due to pollen    some shellfish also   Aortic sclerosis 5/12   On echo. no stenosis   Arthritis    Diastolic dysfunction    stress echo otherwise normal 1/04. Repeat 5/11 also negative   History of seasonal allergies    Hx of basal cell carcinoma 1990   multiple sites   Hx of squamous cell carcinoma of skin 2000   R nasolabial fold   Hyperlipidemia    Osteoarthritis, multiple sites    Osteoporosis    Overactive bladder    Urge incontinence    Vaginal odor 04/2018   aerobic vaginitis on One Swab culture   Past Surgical History:  Procedure Laterality Date   CYSTOCELE REPAIR  2007   and rectocele   DILATION AND CURETTAGE OF UTERUS     INGUINAL HERNIA REPAIR  2012   left side with mesh   REVERSE SHOULDER ARTHROPLASTY Right 04/27/2017   Procedure: REVERSE SHOULDER ARTHROPLASTY;  Surgeon: Corky Mull, MD;  Location: [facility] ORS;  Service: Orthopedics;  Laterality: Right;    TONSILLECTOMY AND ADENOIDECTOMY     as child   TOTAL HIP ARTHROPLASTY Right 05/23/2015   Procedure: TOTAL HIP ARTHROPLASTY ANTERIOR APPROACH;  Surgeon: Hessie Knows, MD;  Location: [facility] ORS;  Service: Orthopedics;  Laterality: Right;   UMBILICAL HERNIA REPAIR  02/04/06    Allergies  Allergen Reactions   Cat Hair Extract    Demerol [Meperidine]     dizzy   Dust Mite Extract    Shellfish Allergy    Avelox [Moxifloxacin Hcl In Nacl] Rash    Outpatient Encounter Medications as of 07/31/2022  Medication Sig   acetaminophen (TYLENOL) 325 MG tablet Take 650 mg by mouth every 6 (six) hours as needed.   aspirin EC 81 MG tablet Take 81 mg by mouth daily. Swallow whole.   diclofenac Sodium (VOLTAREN) 1 % GEL Apply 4 g topically 3 (three) times daily. To left knee see other listing for shoulder use   diclofenac Sodium (VOLTAREN) 1 % GEL Apply 2 g topically every 8 (eight) hours as needed (to right shoulder).   dorzolamide-timolol (COSOPT) 22.3-6.8 MG/ML ophthalmic solution Apply 1 drop to eye 2 (two) times daily.   EPIPEN 2-PAK 0.3 MG/0.3ML SOAJ injection Inject into the muscle as needed for anaphylaxis (or allergic reaction).   fluorouracil (EFUDEX) 5 % cream as directed. Apply to left cheek lesion topically  every day and evening shift 14 days on and 7 days off for Skin Cancer   fluticasone (FLONASE) 50 MCG/ACT nasal spray Place 1 spray into both nostrils 2 (two) times a day.   latanoprost (XALATAN) 0.005 % ophthalmic solution Place 1 drop into both eyes at bedtime.   levocetirizine (XYZAL) 5 MG tablet Take 5 mg by mouth every evening.   melatonin 3 MG TABS tablet Take 3 mg by mouth at bedtime.   mirabegron ER (MYRBETRIQ) 50 MG TB24 tablet Take 50 mg by mouth daily.   Multiple Vitamin (MULTIVITAMIN) tablet Take 1 tablet by mouth daily.   nystatin [facility]) powder Apply to rash under left breast topically every 12 hours as needed for skin breakdown twice daily   polyethylene glycol (MIRALAX  / GLYCOLAX) 17 g packet Take 17 g by mouth daily.   Probiotic Product (PROBIOTIC ADVANCED PO) Take 1 capsule by mouth daily.   Simethicone (GAS-X PO) Take by mouth 2 (two) times daily.   traMADol (ULTRAM) 50 MG tablet One tablet q hs and every 8 hours as needed   No facility-administered encounter medications on file as of 07/31/2022.    Review of Systems  All other systems reviewed and are negative.   Immunization History  Administered Date(s) Administered   Influenza, High Dose Seasonal PF 04/28/2017, 05/16/2018, 05/19/2022   Influenza, Seasonal, Injecte, Preservative Fre 05/28/2016   Influenza,inj,Quad PF,6+ Mos 05/25/2013, 05/07/2015   Influenza-Unspecified 04/24/2014   Moderna SARS-COV2 Booster Vaccination 06/14/2020, 12/20/2020, 12/30/2021   Moderna Sars-Covid-2 Vaccination 08/14/2019, 09/11/2019, 06/12/2022   Pfizer Covid-19 Vaccine Bivalent Booster 14yr & up 04/25/2021   Pneumococcal Conjugate-13 02/07/2014   Pneumococcal Polysaccharide-23 02/15/2012, 03/07/2018   Td 09/12/2002, 02/06/2013   Zoster, Live 02/19/2016   Pertinent  Health Maintenance Due  Topic Date Due   INFLUENZA VACCINE  Completed   DEXA SCAN  Completed      07/06/2021    9:20 PM 07/07/2021    9:35 AM 07/07/2021    8:00 PM 07/08/2021    8:09 AM 08/13/2021    6:31 AM  Fall Risk  Patient Fall Risk Level Moderate fall risk High fall risk High fall risk High fall risk Moderate fall risk   Functional Status Survey:    There were no vitals filed for this visit. There is no height or weight on file to calculate BMI. Physical Exam Constitutional:      Appearance: Normal appearance.  Skin:    Comments: Left great toe with small red area at the medial corner of the toenail bed. Right toenail with curvature into the skin no surrounding erythema. Bilateral TTP at the toenail edges.   Neurological:     Mental Status: She is alert.     Labs reviewed: Recent Labs    08/13/21 0804  NA 135  K 4.4  CL  103  CO2 24  GLUCOSE 111*  BUN 27*  CREATININE 0.88  CALCIUM 9.0   No results for input(s): "AST", "ALT", "ALKPHOS", "BILITOT", "PROT", "ALBUMIN" in the last 8760 hours. Recent Labs    08/13/21 0804  WBC 6.5  NEUTROABS 2.4  HGB 12.0  HCT 36.1  MCV 100.6*  PLT 293   Lab Results  Component Value Date   TSH 1.27 02/06/2013   No results found for: "HGBA1C" Lab Results  Component Value Date   CHOL 232 (H) 02/06/2013   HDL 77.50 02/06/2013   LDLDIRECT 138.4 02/06/2013   TRIG 72.0 02/06/2013   CHOLHDL 3 02/06/2013  Significant Diagnostic Results in last 30 days:  No results found.  Assessment/Plan 1. Ingrown toenail Patient with ingrown toenail bilateral great toes.  No evidence of infection at this time.  Recommend Epsom salt baths nightly to soak feet.  Will follow-up as needed.  Recommend follow-up with podiatry in upcoming months.  Family/ staff Communication: nursing  Labs/tests ordered:  none

## 2022-08-01 DIAGNOSIS — L600 Ingrowing nail: Secondary | ICD-10-CM

## 2022-08-01 HISTORY — DX: Ingrowing nail: L60.0

## 2022-08-13 ENCOUNTER — Non-Acute Institutional Stay (SKILLED_NURSING_FACILITY): Payer: Medicare Other | Admitting: Nurse Practitioner

## 2022-08-13 ENCOUNTER — Encounter: Payer: Self-pay | Admitting: Nurse Practitioner

## 2022-08-13 DIAGNOSIS — M159 Polyosteoarthritis, unspecified: Secondary | ICD-10-CM | POA: Diagnosis not present

## 2022-08-13 DIAGNOSIS — J301 Allergic rhinitis due to pollen: Secondary | ICD-10-CM | POA: Diagnosis not present

## 2022-08-13 DIAGNOSIS — F0150 Vascular dementia without behavioral disturbance: Secondary | ICD-10-CM

## 2022-08-13 DIAGNOSIS — I5032 Chronic diastolic (congestive) heart failure: Secondary | ICD-10-CM | POA: Diagnosis not present

## 2022-08-13 DIAGNOSIS — T148XXA Other injury of unspecified body region, initial encounter: Secondary | ICD-10-CM

## 2022-08-13 NOTE — Progress Notes (Signed)
Location:  Other [facility]) Nursing Home Room Number: 202 A Place of Service:  SNF (31)  Janayah Zavada K. Dewaine Oats, NP   Patient Care Team: Dewayne Shorter, MD as PCP - General [facility])  Extended Emergency Contact Information Primary Emergency Contact: Dibens,Marcia  [place] Phone: [phone] Relation: Daughter  Goals of care: Advanced Directive information    08/13/2022   12:55 PM  Advanced Directives  Does Patient Have a Medical Advance Directive? No  Does patient want to make changes to medical advance directive? No - Patient declined     Chief Complaint  Patient presents with   Medical Management of Chronic Issues    Discuss need for shingrix, AWV, and additional covid boosters or post pone if patient refuses. Vitals and medications are a reflection of [facility]MR system, [facility]     Acute Visit    Knee pain     HPI:  Pt is a 89 y.o. female seen today for medical management of chronic disease. Pt with hx of dementia, OA, overactive bladder, GERD.  Pt noted to have ongoing bruising to left leg.  She denies any pain to leg but has pain to left knee.    Past Medical History:  Diagnosis Date   Allergic rhinitis due to pollen    some shellfish also   Aortic sclerosis 5/12   On echo. no stenosis   Arthritis    Diastolic dysfunction    stress echo otherwise normal 1/04. Repeat 5/11 also negative   History of seasonal allergies    Hx of basal cell carcinoma 1990   multiple sites   Hx of squamous cell carcinoma of skin 2000   R nasolabial fold   Hyperlipidemia    Osteoarthritis, multiple sites    Osteoporosis    Overactive bladder    Urge incontinence    Vaginal odor 04/2018   aerobic vaginitis on One Swab culture   Past Surgical History:  Procedure Laterality Date   CYSTOCELE REPAIR  2007   and rectocele   DILATION AND CURETTAGE OF UTERUS     INGUINAL HERNIA REPAIR  2012   left side with mesh   REVERSE  SHOULDER ARTHROPLASTY Right 04/27/2017   Procedure: REVERSE SHOULDER ARTHROPLASTY;  Surgeon: Corky Mull, MD;  Location: [facility] ORS;  Service: Orthopedics;  Laterality: Right;   TONSILLECTOMY AND ADENOIDECTOMY     as child   TOTAL HIP ARTHROPLASTY Right 05/23/2015   Procedure: TOTAL HIP ARTHROPLASTY ANTERIOR APPROACH;  Surgeon: Hessie Knows, MD;  Location: [facility] ORS;  Service: Orthopedics;  Laterality: Right;   UMBILICAL HERNIA REPAIR  02/04/06    Allergies  Allergen Reactions   Cat Hair Extract    Demerol [Meperidine]     dizzy   Dust Mite Extract    Shellfish Allergy    Avelox [Moxifloxacin Hcl In Nacl] Rash    Outpatient Encounter Medications as of 08/13/2022  Medication Sig   acetaminophen (TYLENOL) 325 MG tablet Take 650 mg by mouth every 6 (six) hours as needed.   aspirin EC 81 MG tablet Take 81 mg by mouth daily. Swallow whole.   diclofenac Sodium (VOLTAREN) 1 % GEL Apply 4 g topically 3 (three) times daily. To left knee see other listing for shoulder use   diclofenac Sodium (VOLTAREN) 1 % GEL Apply 2 g topically every 8 (eight) hours as needed (to right shoulder).   dorzolamide-timolol (COSOPT) 22.3-6.8 MG/ML ophthalmic solution Apply 1 drop to eye 2 (two) times  daily.   EPIPEN 2-PAK 0.3 MG/0.3ML SOAJ injection Inject into the muscle as needed for anaphylaxis (or allergic reaction).   fluorouracil (EFUDEX) 5 % cream as directed. Apply to left cheek lesion topically every day and evening shift 14 days on and 7 days off for Skin Cancer   fluticasone (FLONASE) 50 MCG/ACT nasal spray Place 1 spray into both nostrils 2 (two) times a day.   latanoprost (XALATAN) 0.005 % ophthalmic solution Place 1 drop into both eyes at bedtime.   levocetirizine (XYZAL) 5 MG tablet Take 5 mg by mouth every evening.   melatonin 3 MG TABS tablet Take 3 mg by mouth at bedtime.   mirabegron ER (MYRBETRIQ) 50 MG TB24 tablet Take 50 mg by mouth daily.   Multiple Vitamin (MULTIVITAMIN) tablet Take 1 tablet  by mouth daily.   nystatin [facility]) powder Apply to rash under left breast topically every 12 hours as needed for skin breakdown twice daily   polyethylene glycol (MIRALAX / GLYCOLAX) 17 g packet Take 17 g by mouth daily.   Probiotic Product (PROBIOTIC ADVANCED PO) Take 1 capsule by mouth daily.   Simethicone (GAS-X PO) Take by mouth 2 (two) times daily.   traMADol (ULTRAM) 50 MG tablet One tablet q hs and every 8 hours as needed   No facility-administered encounter medications on file as of 08/13/2022.    Review of Systems  Constitutional:  Negative for activity change, appetite change, fatigue and unexpected weight change.  HENT:  Negative for congestion and hearing loss.   Eyes: Negative.   Respiratory:  Negative for cough and shortness of breath.   Cardiovascular:  Negative for chest pain, palpitations and leg swelling.  Gastrointestinal:  Negative for abdominal pain, constipation and diarrhea.  Genitourinary:  Negative for difficulty urinating and dysuria.  Musculoskeletal:  Positive for arthralgias.  Skin:  Negative for color change and wound.  Allergic/Immunologic: Positive for environmental allergies.  Neurological:  Negative for dizziness and weakness.  Psychiatric/Behavioral:  Positive for confusion. Negative for agitation and behavioral problems.      Immunization History  Administered Date(s) Administered   Influenza, High Dose Seasonal PF 04/28/2017, 05/16/2018, 05/19/2022   Influenza, Seasonal, Injecte, Preservative Fre 05/28/2016   Influenza,inj,Quad PF,6+ Mos 05/25/2013, 05/07/2015   Influenza-Unspecified 04/24/2014   Moderna SARS-COV2 Booster Vaccination 06/14/2020, 12/20/2020, 12/30/2021   Moderna Sars-Covid-2 Vaccination 08/14/2019, 09/11/2019, 06/12/2022   Pfizer Covid-19 Vaccine Bivalent Booster 45yr & up 04/25/2021   Pneumococcal Conjugate-13 02/07/2014   Pneumococcal Polysaccharide-23 02/15/2012, 03/07/2018   Td 09/12/2002, 02/06/2013   Zoster, Live  02/19/2016   Pertinent  Health Maintenance Due  Topic Date Due   INFLUENZA VACCINE  Completed   DEXA SCAN  Completed      07/06/2021    9:20 PM 07/07/2021    9:35 AM 07/07/2021    8:00 PM 07/08/2021    8:09 AM 08/13/2021    6:31 AM  Fall Risk  Patient Fall Risk Level Moderate fall risk High fall risk High fall risk High fall risk Moderate fall risk   Functional Status Survey:    Vitals:   08/13/22 1254  BP: (!) 158/77  Pulse: 75  Weight: 144 lb (65.3 kg)  Height: '5\' 6"'$  (1.676 m)   Body mass index is 23.24 kg/m. Physical Exam Constitutional:      General: She is not in acute distress.    Appearance: She is well-developed. She is not diaphoretic.  HENT:     Head: Normocephalic and atraumatic.     Mouth/Throat:  Pharynx: No oropharyngeal exudate.  Eyes:     Conjunctiva/sclera: Conjunctivae normal.     Pupils: Pupils are equal, round, and reactive to light.  Cardiovascular:     Rate and Rhythm: Normal rate and regular rhythm.     Heart sounds: Normal heart sounds.  Pulmonary:     Effort: Pulmonary effort is normal.     Breath sounds: Normal breath sounds.  Abdominal:     General: Bowel sounds are normal.     Palpations: Abdomen is soft.  Musculoskeletal:     Cervical back: Normal range of motion and neck supple.     Left knee: Swelling present. Tenderness present.     Right lower leg: No edema.     Left lower leg: No edema.  Skin:    General: Skin is warm and dry.     Findings: Bruising (noted to left lower leg and to upper lateral shin) present.  Neurological:     Mental Status: She is alert.  Psychiatric:        Mood and Affect: Mood normal.     Labs reviewed: No results for input(s): "NA", "K", "CL", "CO2", "GLUCOSE", "BUN", "CREATININE", "CALCIUM", "MG", "PHOS" in the last 8760 hours. No results for input(s): "AST", "ALT", "ALKPHOS", "BILITOT", "PROT", "ALBUMIN" in the last 8760 hours. No results for input(s): "WBC", "NEUTROABS", "HGB", "HCT", "MCV",  "PLT" in the last 8760 hours. Lab Results  Component Value Date   TSH 1.27 02/06/2013   No results found for: "HGBA1C" Lab Results  Component Value Date   CHOL 232 (H) 02/06/2013   HDL 77.50 02/06/2013   LDLDIRECT 138.4 02/06/2013   TRIG 72.0 02/06/2013   CHOLHDL 3 02/06/2013    Significant Diagnostic Results in last 30 days:  No results found.  Assessment/Plan 1. Primary osteoarthritis involving multiple joints -significant tenderness to left knee with swelling, previously has seen orthopedics. Will make follow up Tylenol 500 mg TID scheduled at this time.  Has tramadol PRN -follow up cmp  2. Chronic diastolic heart failure (HCC) -stable, no signs of fluid overload.   3. Seasonal allergic rhinitis due to pollen -followed by allergies, symptoms stable.   4. Vascular dementia without behavioral disturbance (HCC) -.Stable, no acute changes in cognitive or functional status, continue supportive care by family and staff.  5. Bruising -will follow up CBC at this time.    [name] [name]. [place], [facility] [phone]

## 2022-08-17 LAB — COMPREHENSIVE METABOLIC PANEL
Albumin: 3.6 (ref 3.5–5.0)
Calcium: 9 (ref 8.7–10.7)
Globulin: 2.6
eGFR: 53

## 2022-08-17 LAB — BASIC METABOLIC PANEL
BUN: 18 (ref 4–21)
CO2: 28 — AB (ref 13–22)
Chloride: 104 (ref 99–108)
Creatinine: 1 (ref 0.5–1.1)
Glucose: 87
Potassium: 4.6 mEq/L (ref 3.5–5.1)
Sodium: 137 (ref 137–147)

## 2022-08-17 LAB — CBC: RBC: 3.97 (ref 3.87–5.11)

## 2022-08-17 LAB — HEPATIC FUNCTION PANEL
ALT: 10 U/L (ref 7–35)
AST: 18 (ref 13–35)
Alkaline Phosphatase: 90 (ref 25–125)
Bilirubin, Total: 0.4

## 2022-08-17 LAB — CBC AND DIFFERENTIAL
HCT: 38 (ref 36–46)
Hemoglobin: 12.4 (ref 12.0–16.0)
Neutrophils Absolute: 2747
Platelets: 253 10*3/uL (ref 150–400)
WBC: 8.3

## 2022-08-25 ENCOUNTER — Other Ambulatory Visit: Payer: Self-pay | Admitting: Adult Health

## 2022-08-25 MED ORDER — TRAMADOL HCL 50 MG PO TABS: ORAL_TABLET | ORAL | 0 refills | Status: DC

## 2022-08-28 ENCOUNTER — Ambulatory Visit: Payer: Medicare Other | Admitting: Student

## 2022-08-28 ENCOUNTER — Encounter: Payer: Self-pay | Admitting: Student

## 2022-08-28 ENCOUNTER — Telehealth: Payer: Self-pay

## 2022-08-28 DIAGNOSIS — F0150 Vascular dementia without behavioral disturbance: Secondary | ICD-10-CM

## 2022-08-28 NOTE — Progress Notes (Unsigned)
Location:  Other [facility].  Nursing Home Room Number: [facility] 202A Place of Service:  SNF [phone]) Provider:  Earnestine Mealing, MD  Patient Care Team: Earnestine Mealing, MD as PCP - General [facility])  Extended Emergency Contact Information Primary Emergency Contact: Dibens,Marcia  [place] of [place] Home Phone: [phone] Relation: Daughter  Code Status:  Full Code Goals of care: Advanced Directive information    08/28/2022    2:02 PM  Advanced Directives  Does Patient Have a Medical Advance Directive? No  Does patient want to make changes to medical advance directive? No - Patient declined     Chief Complaint  Patient presents with   Medical Management of Chronic Issues    Medical Management of Chronic Issues.     HPI:  Pt is a 89 y.o. female seen today for medical management of chronic diseases.     Past Medical History:  Diagnosis Date   Allergic rhinitis due to pollen    some shellfish also   Aortic sclerosis 5/12   On echo. no stenosis   Arthritis    Diastolic dysfunction    stress echo otherwise normal 1/04. Repeat 5/11 also negative   History of seasonal allergies    Hx of basal cell carcinoma 1990   multiple sites   Hx of squamous cell carcinoma of skin 2000   R nasolabial fold   Hyperlipidemia    Osteoarthritis, multiple sites    Osteoporosis    Overactive bladder    Urge incontinence    Vaginal odor 04/2018   aerobic vaginitis on One Swab culture   Past Surgical History:  Procedure Laterality Date   CYSTOCELE REPAIR  2007   and rectocele   DILATION AND CURETTAGE OF UTERUS     INGUINAL HERNIA REPAIR  2012   left side with mesh   REVERSE SHOULDER ARTHROPLASTY Right 04/27/2017   Procedure: REVERSE SHOULDER ARTHROPLASTY;  Surgeon: Christena Flake, MD;  Location: [facility] ORS;  Service: Orthopedics;  Laterality: Right;   TONSILLECTOMY AND ADENOIDECTOMY     as child   TOTAL HIP ARTHROPLASTY Right 05/23/2015   Procedure: TOTAL HIP  ARTHROPLASTY ANTERIOR APPROACH;  Surgeon: Kennedy Bucker, MD;  Location: [facility] ORS;  Service: Orthopedics;  Laterality: Right;   UMBILICAL HERNIA REPAIR  02/04/06    Allergies  Allergen Reactions   Cat Hair Extract    Demerol [Meperidine]     dizzy   Dust Mite Extract    Shellfish Allergy    Avelox [Moxifloxacin Hcl In Nacl] Rash    Outpatient Encounter Medications as of 08/28/2022  Medication Sig   acetaminophen (TYLENOL) 325 MG tablet Take 650 mg by mouth every 6 (six) hours as needed.   acetaminophen (TYLENOL) 500 MG tablet Take 500 mg by mouth 3 (three) times daily.   aspirin EC 81 MG tablet Take 81 mg by mouth daily. Swallow whole.   brimonidine (ALPHAGAN) 0.2 % ophthalmic solution Place 1 drop into both eyes 2 (two) times daily.   diclofenac Sodium (VOLTAREN) 1 % GEL Apply 4 g topically 3 (three) times daily. To left knee see other listing for shoulder use   diclofenac Sodium (VOLTAREN) 1 % GEL Apply 2 g topically every 8 (eight) hours as needed (to right shoulder).   dorzolamide-timolol (COSOPT) 22.3-6.8 MG/ML ophthalmic solution Apply 1 drop to eye 2 (two) times daily.   EPIPEN 2-PAK 0.3 MG/0.3ML SOAJ injection Inject into the muscle as needed for anaphylaxis (or allergic reaction).   fluorouracil (EFUDEX) 5 %  cream as directed. Apply to left cheek lesion topically every day and evening shift 14 days on and 7 days off for Skin Cancer   fluticasone (FLONASE) 50 MCG/ACT nasal spray Place 1 spray into both nostrils 2 (two) times a day.   latanoprost (XALATAN) 0.005 % ophthalmic solution Place 1 drop into both eyes at bedtime.   levocetirizine (XYZAL) 5 MG tablet Take 5 mg by mouth every evening.   melatonin 3 MG TABS tablet Take 3 mg by mouth at bedtime.   mirabegron ER (MYRBETRIQ) 50 MG TB24 tablet Take 50 mg by mouth daily.   Multiple Vitamin (MULTIVITAMIN) tablet Take 1 tablet by mouth daily.   nystatin [facility]) powder Apply to rash under left breast topically every 12 hours as  needed for skin breakdown twice daily   polyethylene glycol (MIRALAX / GLYCOLAX) 17 g packet Take 17 g by mouth daily.   Probiotic Product (PROBIOTIC ADVANCED PO) Take 1 capsule by mouth daily.   Simethicone (GAS-X PO) Take by mouth 2 (two) times daily.   traMADol (ULTRAM) 50 MG tablet Take 50 mg by mouth at bedtime. Give one tablet by mouth every 8 hours as needed.   [DISCONTINUED] traMADol (ULTRAM) 50 MG tablet One tablet q hs and every 8 hours as needed (Patient taking differently: Take 50 mg by mouth at bedtime. One tablet q hs and every 8 hours as needed)   No facility-administered encounter medications on file as of 08/28/2022.    Review of Systems  Immunization History  Administered Date(s) Administered   Influenza, High Dose Seasonal PF 04/28/2017, 05/16/2018, 05/19/2022   Influenza, Seasonal, Injecte, Preservative Fre 05/28/2016   Influenza,inj,Quad PF,6+ Mos 05/25/2013, 05/07/2015   Influenza-Unspecified 04/24/2014, 05/20/2021   Moderna SARS-COV2 Booster Vaccination 06/14/2020, 12/20/2020, 12/30/2021   Moderna Sars-Covid-2 Vaccination 08/14/2019, 09/11/2019, 06/14/2020, 04/25/2021, 06/12/2022   Pfizer Covid-19 Vaccine Bivalent Booster 9yrs & up 04/25/2021   Pneumococcal Conjugate-13 02/07/2014   Pneumococcal Polysaccharide-23 02/15/2012, 03/07/2018   Td 09/12/2002, 02/06/2013   Zoster, Live 02/19/2016   Pertinent  Health Maintenance Due  Topic Date Due   INFLUENZA VACCINE  Completed   DEXA SCAN  Completed      07/06/2021    9:20 PM 07/07/2021    9:35 AM 07/07/2021    8:00 PM 07/08/2021    8:09 AM 08/13/2021    6:31 AM  Fall Risk  (RETIRED) Patient Fall Risk Level Moderate fall risk High fall risk High fall risk High fall risk Moderate fall risk   Functional Status Survey:    Vitals:   08/28/22 1312  Weight: 144 lb 12.8 oz (65.7 kg)  Height: 5\' 6"  (1.676 m)   Body mass index is 23.37 kg/m. Physical Exam  Labs reviewed: Recent Labs    08/17/22 0000  NA 137   K 4.6  CL 104  CO2 28*  BUN 18  CREATININE 1.0  CALCIUM 9.0   Recent Labs    08/17/22 0000  AST 18  ALT 10  ALKPHOS 90  ALBUMIN 3.6   Recent Labs    08/17/22 0000  WBC 8.3  NEUTROABS 2,747.00  HGB 12.4  HCT 38  PLT 253   Lab Results  Component Value Date   TSH 1.27 02/06/2013   No results found for: "HGBA1C" Lab Results  Component Value Date   CHOL 232 (H) 02/06/2013   HDL 77.50 02/06/2013   LDLDIRECT 138.4 02/06/2013   TRIG 72.0 02/06/2013   CHOLHDL 3 02/06/2013    Significant Diagnostic Results in  last 30 days:  No results found.  Assessment/Plan There are no diagnoses linked to this encounter.   Family/ staff Communication: ***  Labs/tests ordered:  ***

## 2022-08-28 NOTE — Telephone Encounter (Signed)
[name] called again while I was covering clinical intake and emphasized that she is not feeling well, lives in [place], and it would be difficult for her to bring her mother to the 1 pm appointment. [name] asked if Dr.Beamer could do the visit with her via video/facetime.   I informed [name] that due to legal and insurance regulations we are prohibited from performing visits under a patients name without the patient physically being present, as that would be consider insurance fraud. [name] verbalized understanding and asked that we cancel the appointment, however she would like for Dr.Beamer to call her to discuss the nature of the visit before it is discussed in front of her mother (may upset her). [name] plans to reschedule the visit after speaking with Dr.Beamer.   I called Dr.Beamer's medical assistant to inform her about canceled appointment and the details of this call.

## 2022-08-28 NOTE — Telephone Encounter (Signed)
Patient's daughter, [name] called and left message on clinical intake voicemail. She is wanting to know if face time can be done instead of coming into clinic. I tried calling daughter back to see if she was requesting visit to be a mychart video visit instead. There was no answer and message left for her to return call.

## 2022-09-14 ENCOUNTER — Non-Acute Institutional Stay (SKILLED_NURSING_FACILITY): Payer: Medicare Other | Admitting: Student

## 2022-09-14 DIAGNOSIS — I5032 Chronic diastolic (congestive) heart failure: Secondary | ICD-10-CM

## 2022-09-14 DIAGNOSIS — F03B0 Unspecified dementia, moderate, without behavioral disturbance, psychotic disturbance, mood disturbance, and anxiety: Secondary | ICD-10-CM | POA: Diagnosis not present

## 2022-09-14 DIAGNOSIS — N3941 Urge incontinence: Secondary | ICD-10-CM

## 2022-09-14 DIAGNOSIS — I872 Venous insufficiency (chronic) (peripheral): Secondary | ICD-10-CM

## 2022-09-14 DIAGNOSIS — I700 Atherosclerosis of aorta: Secondary | ICD-10-CM | POA: Diagnosis not present

## 2022-09-14 DIAGNOSIS — W19XXXA Unspecified fall, initial encounter: Secondary | ICD-10-CM | POA: Diagnosis not present

## 2022-09-22 ENCOUNTER — Encounter: Payer: Self-pay | Admitting: Student

## 2022-09-22 NOTE — Progress Notes (Signed)
Location:  Other Nursing Home Room Number: 202A Place of Service:  SNF (31) Provider:  Wynn Banker, Jordan Hawks, MD  Patient Care Team: Dewayne Shorter, MD as PCP - General [facility] Medicine)  Extended Emergency Contact Information Primary Emergency Contact: Dibens,Marcia  [place] Phone: [phone] Relation: Daughter  Code Status:  Full Code Goals of care: Advanced Directive information    08/28/2022    2:02 PM  Advanced Directives  Does Patient Have a Medical Advance Directive? No  Does patient want to make changes to medical advance directive? No - Patient declined     Chief Complaint  Patient presents with   Fall    HPI:  Pt is a 89 y.o. female seen today for an acute and routine visit for follow up after a fall. Patient had an unwitnessed fall. She states she was in the bathroom and fail. She hit her head, however, has had normal neuro checks for the last 48 hours per nursing. Discussed patient's current status. Patient has a full conversation regarding where she is from and old, historical information. Asked patient where she lives and she says here at [facility]. Asked patient, if you were to live alone, do you think you would have challenges, and she says no. I think I could live home alone. Asked patient how she would get food and pay bills and she could not answer this question.    Past Medical History:  Diagnosis Date   Allergic rhinitis due to pollen    some shellfish also   Aortic sclerosis 5/12   On echo. no stenosis   Arthritis    Diastolic dysfunction    stress echo otherwise normal 1/04. Repeat 5/11 also negative   History of seasonal allergies    Hx of basal cell carcinoma 1990   multiple sites   Hx of squamous cell carcinoma of skin 2000   R nasolabial fold   Hyperlipidemia    Osteoarthritis, multiple sites    Osteoporosis    Overactive bladder    Urge incontinence    Vaginal odor 04/2018   aerobic vaginitis on One  Swab culture   Past Surgical History:  Procedure Laterality Date   CYSTOCELE REPAIR  2007   and rectocele   DILATION AND CURETTAGE OF UTERUS     INGUINAL HERNIA REPAIR  2012   left side with mesh   REVERSE SHOULDER ARTHROPLASTY Right 04/27/2017   Procedure: REVERSE SHOULDER ARTHROPLASTY;  Surgeon: Corky Mull, MD;  Location: [facility] ORS;  Service: Orthopedics;  Laterality: Right;   TONSILLECTOMY AND ADENOIDECTOMY     as child   TOTAL HIP ARTHROPLASTY Right 05/23/2015   Procedure: TOTAL HIP ARTHROPLASTY ANTERIOR APPROACH;  Surgeon: Hessie Knows, MD;  Location: [facility] ORS;  Service: Orthopedics;  Laterality: Right;   UMBILICAL HERNIA REPAIR  02/04/06    Allergies  Allergen Reactions   Cat Hair Extract    Demerol [Meperidine]     dizzy   Dust Mite Extract    Shellfish Allergy    Avelox [Moxifloxacin Hcl In Nacl] Rash    Outpatient Encounter Medications as of 09/14/2022  Medication Sig   acetaminophen (TYLENOL) 325 MG tablet Take 650 mg by mouth every 6 (six) hours as needed.   acetaminophen (TYLENOL) 500 MG tablet Take 500 mg by mouth 3 (three) times daily.   aspirin EC 81 MG tablet Take 81 mg by mouth daily. Swallow whole.   brimonidine (ALPHAGAN) 0.2 % ophthalmic solution Place 1 drop  into both eyes 2 (two) times daily.   diclofenac Sodium (VOLTAREN) 1 % GEL Apply 4 g topically 3 (three) times daily. To left knee see other listing for shoulder use   diclofenac Sodium (VOLTAREN) 1 % GEL Apply 2 g topically every 8 (eight) hours as needed (to right shoulder).   dorzolamide-timolol (COSOPT) 22.3-6.8 MG/ML ophthalmic solution Apply 1 drop to eye 2 (two) times daily.   EPIPEN 2-PAK 0.3 MG/0.3ML SOAJ injection Inject into the muscle as needed for anaphylaxis (or allergic reaction).   fluorouracil (EFUDEX) 5 % cream as directed. Apply to left cheek lesion topically every day and evening shift 14 days on and 7 days off for Skin Cancer   fluticasone (FLONASE) 50 MCG/ACT nasal spray Place 1  spray into both nostrils 2 (two) times a day.   latanoprost (XALATAN) 0.005 % ophthalmic solution Place 1 drop into both eyes at bedtime.   levocetirizine (XYZAL) 5 MG tablet Take 5 mg by mouth every evening.   melatonin 3 MG TABS tablet Take 3 mg by mouth at bedtime.   mirabegron ER (MYRBETRIQ) 50 MG TB24 tablet Take 50 mg by mouth daily.   Multiple Vitamin (MULTIVITAMIN) tablet Take 1 tablet by mouth daily.   nystatin [facility]) powder Apply to rash under left breast topically every 12 hours as needed for skin breakdown twice daily   polyethylene glycol (MIRALAX / GLYCOLAX) 17 g packet Take 17 g by mouth daily.   Probiotic Product (PROBIOTIC ADVANCED PO) Take 1 capsule by mouth daily.   Simethicone (GAS-X PO) Take by mouth 2 (two) times daily.   traMADol (ULTRAM) 50 MG tablet Take 50 mg by mouth at bedtime. Give one tablet by mouth every 8 hours as needed.   No facility-administered encounter medications on file as of 09/14/2022.    Review of Systems  Immunization History  Administered Date(s) Administered   Influenza, High Dose Seasonal PF 04/28/2017, 05/16/2018, 05/19/2022   Influenza, Seasonal, Injecte, Preservative Fre 05/28/2016   Influenza,inj,Quad PF,6+ Mos 05/25/2013, 05/07/2015   Influenza-Unspecified 04/24/2014, 05/20/2021   Moderna SARS-COV2 Booster Vaccination 06/14/2020, 12/20/2020, 12/30/2021   Moderna Sars-Covid-2 Vaccination 08/14/2019, 09/11/2019, 06/14/2020, 04/25/2021, 06/12/2022   Pfizer Covid-19 Vaccine Bivalent Booster 81yr & up 04/25/2021   Pneumococcal Conjugate-13 02/07/2014   Pneumococcal Polysaccharide-23 02/15/2012, 03/07/2018   Td 09/12/2002, 02/06/2013   Zoster, Live 02/19/2016   Pertinent  Health Maintenance Due  Topic Date Due   INFLUENZA VACCINE  Completed   DEXA SCAN  Completed      07/06/2021    9:20 PM 07/07/2021    9:35 AM 07/07/2021    8:00 PM 07/08/2021    8:09 AM 08/13/2021    6:31 AM  Fall Risk  (RETIRED) Patient Fall Risk Level Moderate  fall risk High fall risk High fall risk High fall risk Moderate fall risk   Functional Status Survey:    Vitals:   09/14/22 0836  BP: 107/63  Pulse: 90  Resp: 18  SpO2: 93%  Weight: 146 lb 3.2 oz (66.3 kg)   Body mass index is 23.6 kg/m. Physical Exam Constitutional:      Appearance: Normal appearance.  Cardiovascular:     Rate and Rhythm: Normal rate.     Pulses: Normal pulses.  Pulmonary:     Effort: Pulmonary effort is normal.  Abdominal:     General: Abdomen is flat.  Skin:    Comments: 2cm bruise on posterior scalp  Neurological:     Mental Status: She is alert. Mental status  is at baseline.     Comments: 4/5 strength bilateral upper and lower extremities. Fluent speech. CN 2-12 grossly intact and symmetric.   Psychiatric:        Mood and Affect: Mood normal.        Thought Content: Thought content normal.     Labs reviewed: Recent Labs    08/17/22 0000  NA 137  K 4.6  CL 104  CO2 28*  BUN 18  CREATININE 1.0  CALCIUM 9.0   Recent Labs    08/17/22 0000  AST 18  ALT 10  ALKPHOS 90  ALBUMIN 3.6   Recent Labs    08/17/22 0000  WBC 8.3  NEUTROABS 2,747.00  HGB 12.4  HCT 38  PLT 253   Lab Results  Component Value Date   TSH 1.27 02/06/2013   No results found for: "HGBA1C" Lab Results  Component Value Date   CHOL 232 (H) 02/06/2013   HDL 77.50 02/06/2013   LDLDIRECT 138.4 02/06/2013   TRIG 72.0 02/06/2013   CHOLHDL 3 02/06/2013    Significant Diagnostic Results in last 30 days:  No results found.  Assessment/Plan Moderate dementia without behavioral disturbance, psychotic disturbance, mood disturbance, or anxiety, unspecified dementia type ([place])  Fall, initial encounter  Chronic diastolic heart failure ([place])  Atherosclerosis of aorta ([place])  Peripheral venous insufficiency  Urge incontinence Patient's dementia continues to decline. Oriented to self, however, difficult time describing location. She also has lost 10 lbs over the  course of the last year. BMI normal range. Encouraged use of call bell to help with fall prevention. PT/OT. Continue ASA for atherosclerosis. Continue flonase for rhinorrhea. Continue myrbetriq for urge incontinence. Continue Tramadol nightly for pain. Daughter discussed concern that patient's code status should be updated to DNR, will continue [place] conversations as patient's dementia has progressed to the point that she is unable to define the harms and benefits of treatment, and should defer to [facility].    Family/ staff Communication: nursing  Labs/tests ordered:  none

## 2022-10-13 LAB — HM DIABETES FOOT EXAM

## 2022-10-16 ENCOUNTER — Encounter: Payer: Self-pay | Admitting: Student

## 2022-10-16 ENCOUNTER — Non-Acute Institutional Stay (SKILLED_NURSING_FACILITY): Payer: Medicare Other | Admitting: Student

## 2022-10-16 DIAGNOSIS — F03B0 Unspecified dementia, moderate, without behavioral disturbance, psychotic disturbance, mood disturbance, and anxiety: Secondary | ICD-10-CM | POA: Diagnosis not present

## 2022-10-16 DIAGNOSIS — E441 Mild protein-calorie malnutrition: Secondary | ICD-10-CM

## 2022-10-16 NOTE — Progress Notes (Signed)
Provider:  Dr. Dewayne Shorter Location:  Other [place].  Nursing Home Room Number: [place] of Service:  SNF (31)  PCP: Dewayne Shorter, MD Patient Care Team: Dewayne Shorter, MD as PCP - General [facility] Medicine)  Extended Emergency Contact Information Primary Emergency Contact: Dibens,Marcia  [place] Phone: [phone] Relation: Daughter  Code Status: DNR Goals of Care: Advanced Directive information    10/16/2022   10:14 AM  Advanced Directives  Does Patient Have a Medical Advance Directive? No  Does patient want to make changes to medical advance directive? No - Patient declined    Chief Complaint  Patient presents with   Acute Visit    Dementia    HPI: Patient is a 89 y.o. female seen today for acute visit for weight loss. Patient has had notablly she weight 155 lbs 10/2021 and this year weighs 140 lbs 10/2022.   Patient is alert, oriented, however, lacks insight to the situation. She states, I just don't want to eat. She denies poor sleep or mood.   Advance Care Planning  Called her daughter and Jarold Song. Discussed concern that patient no longer has the ability to make decisions regarding her health due to lack of insight in the setting of progressing dementia. Daughter states she understands her mother is declining and she wants to make sure we don't do anything to expedite her mother's death, however, she knows that if she were to get to the point of needing CPR, she would not survive. Discussed concern that given patient's weight loss in the last year of >10% she is nearing the point of a Hospice consultation, however, she declined this service at this time. Patient will have an updated code status of DNR at this time.       Past Medical History:  Diagnosis Date   Allergic rhinitis due to pollen    some shellfish also   Aortic sclerosis 5/12   On echo. no stenosis   Arthritis    Diastolic dysfunction    stress  echo otherwise normal 1/04. Repeat 5/11 also negative   History of seasonal allergies    Hx of basal cell carcinoma 1990   multiple sites   Hx of squamous cell carcinoma of skin 2000   R nasolabial fold   Hyperlipidemia    Osteoarthritis, multiple sites    Osteoporosis    Overactive bladder    Urge incontinence    Vaginal odor 04/2018   aerobic vaginitis on One Swab culture   Past Surgical History:  Procedure Laterality Date   CYSTOCELE REPAIR  2007   and rectocele   DILATION AND CURETTAGE OF UTERUS     INGUINAL HERNIA REPAIR  2012   left side with mesh   REVERSE SHOULDER ARTHROPLASTY Right 04/27/2017   Procedure: REVERSE SHOULDER ARTHROPLASTY;  Surgeon: Corky Mull, MD;  Location: [facility] ORS;  Service: Orthopedics;  Laterality: Right;   TONSILLECTOMY AND ADENOIDECTOMY     as child   TOTAL HIP ARTHROPLASTY Right 05/23/2015   Procedure: TOTAL HIP ARTHROPLASTY ANTERIOR APPROACH;  Surgeon: Hessie Knows, MD;  Location: [facility] ORS;  Service: Orthopedics;  Laterality: Right;   UMBILICAL HERNIA REPAIR  02/04/06    reports that she has quit smoking. She has never used smokeless tobacco. She reports current alcohol use. She reports that she does not use drugs. Social History   Socioeconomic History   Marital status: Widowed    Spouse name: Not on file   Number  of children: 3   Years of education: Not on file   Highest education level: Not on file  Occupational History   Occupation: Pharmacist, hospital    Comment: short time   Occupation: Herbalist    Comment: most of career  Tobacco Use   Smoking status: Former   Smokeless tobacco: Never  Scientific laboratory technician Use: Never used  Substance and Sexual Activity   Alcohol use: Yes    Comment: wine before dinner   Drug use: No   Sexual activity: Not Currently    Birth control/protection: Post-menopausal  Other Topics Concern   Not on file  Social History Narrative   Widowed 2019   Has living will   Daughter [name], is health care POA    Would accept trial of resuscitation   Would probably accept feeding tube   Social Determinants of Health   Financial Resource Strain: Not on file  Food Insecurity: Not on file  Transportation Needs: Not on file  Physical Activity: Not on file  Stress: Not on file  Social Connections: Not on file  Intimate Partner Violence: Not on file    Functional Status Survey:    Family History  Problem Relation Age of Onset   Cancer Father 60       colon cancer   Heart disease Father     Health Maintenance  Topic Date Due   Zoster Vaccines- Shingrix (1 of 2) Never done   [employer] Annual Wellness (AWV)  03/08/2019   COVID-19 Vaccine (10 - 2023-24 season) 08/07/2022   DTaP/Tdap/Td (3 - Tdap) 02/07/2023   Pneumonia Vaccine 32+ Years old  Completed   INFLUENZA VACCINE  Completed   DEXA SCAN  Completed   HPV VACCINES  Aged Out    Allergies  Allergen Reactions   Cat Hair Extract    Demerol [Meperidine]     dizzy   Dust Mite Extract    Shellfish Allergy    Avelox [Moxifloxacin Hcl In Nacl] Rash    Outpatient Encounter Medications as of 10/16/2022  Medication Sig   acetaminophen (TYLENOL) 325 MG tablet Take 650 mg by mouth every 6 (six) hours as needed.   acetaminophen (TYLENOL) 500 MG tablet Take 500 mg by mouth 3 (three) times daily.   aspirin EC 81 MG tablet Take 81 mg by mouth daily. Swallow whole.   brimonidine (ALPHAGAN) 0.2 % ophthalmic solution Place 1 drop into both eyes 2 (two) times daily.   diclofenac Sodium (VOLTAREN) 1 % GEL Apply 4 g topically 3 (three) times daily. To left knee see other listing for shoulder use   diclofenac Sodium (VOLTAREN) 1 % GEL Apply 2 g topically every 8 (eight) hours as needed (to right shoulder).   dorzolamide-timolol (COSOPT) 22.3-6.8 MG/ML ophthalmic solution Apply 1 drop to eye 2 (two) times daily.   EPIPEN 2-PAK 0.3 MG/0.3ML SOAJ injection Inject into the muscle as needed for anaphylaxis (or allergic reaction).   fluticasone (FLONASE)  50 MCG/ACT nasal spray Place 1 spray into both nostrils 2 (two) times a day.   latanoprost (XALATAN) 0.005 % ophthalmic solution Place 1 drop into both eyes at bedtime.   levocetirizine (XYZAL) 5 MG tablet Take 5 mg by mouth every evening.   melatonin 3 MG TABS tablet Take 3 mg by mouth at bedtime.   mirabegron ER (MYRBETRIQ) 50 MG TB24 tablet Take 50 mg by mouth daily.   Multiple Vitamin (MULTIVITAMIN) tablet Take 1 tablet by mouth daily.   nystatin [facility]) powder  Apply to rash under left breast topically every 12 hours as needed for skin breakdown twice daily   polyethylene glycol (MIRALAX / GLYCOLAX) 17 g packet Take 17 g by mouth daily.   Probiotic Product (PROBIOTIC ADVANCED PO) Take 1 capsule by mouth daily.   Simethicone (GAS-X PO) Take by mouth 2 (two) times daily.   traMADol (ULTRAM) 50 MG tablet Take 50 mg by mouth at bedtime. Give one tablet by mouth every 8 hours as needed.   [DISCONTINUED] fluorouracil (EFUDEX) 5 % cream as directed. Apply to left cheek lesion topically every day and evening shift 14 days on and 7 days off for Skin Cancer   No facility-administered encounter medications on file as of 10/16/2022.    Review of Systems  Vitals:   10/16/22 1004  BP: 119/71  Pulse: 87  Resp: 18  Temp: 97.7 F (36.5 C)  SpO2: 95%  Weight: 140 lb 9.6 oz (63.8 kg)  Height: 5\' 6"  (1.676 m)   Body mass index is 22.69 kg/m. Physical Exam Constitutional:      Comments: Thin, with muscle wasting in face and shoulders  Cardiovascular:     Rate and Rhythm: Normal rate.     Pulses: Normal pulses.     Heart sounds: Normal heart sounds.  Pulmonary:     Effort: Pulmonary effort is normal.     Breath sounds: Normal breath sounds.  Abdominal:     General: Abdomen is flat. Bowel sounds are normal.     Palpations: Abdomen is soft.  Skin:    General: Skin is warm.  Neurological:     Mental Status: She is alert. Mental status is at baseline.     Comments: Oriented to self,  location, however, uncertain for the year.   Psychiatric:        Mood and Affect: Mood normal.     Comments: Lacks insight or judgement.      Labs reviewed: Basic Metabolic Panel: Recent Labs    08/17/22 0000  NA 137  K 4.6  CL 104  CO2 28*  BUN 18  CREATININE 1.0  CALCIUM 9.0   Liver Function Tests: Recent Labs    08/17/22 0000  AST 18  ALT 10  ALKPHOS 90  ALBUMIN 3.6   No results for input(s): "LIPASE", "AMYLASE" in the last 8760 hours. No results for input(s): "AMMONIA" in the last 8760 hours. CBC: Recent Labs    08/17/22 0000  WBC 8.3  NEUTROABS 2,747.00  HGB 12.4  HCT 38  PLT 253   Cardiac Enzymes: No results for input(s): "CKTOTAL", "CKMB", "CKMBINDEX", "TROPONINI" in the last 8760 hours. BNP: Invalid input(s): "POCBNP" No results found for: "HGBA1C" Lab Results  Component Value Date   TSH 1.27 02/06/2013   No results found for: "VITAMINB12" No results found for: "FOLATE" No results found for: "IRON", "TIBC", "FERRITIN"  Imaging and Procedures obtained prior to SNF admission: DG Femur Min 2 Views Left  Result Date: 08/13/2021 CLINICAL DATA:  89 year old female with history of trauma from a fall complaining of left-sided hip pain. EXAM: LEFT FEMUR 2 VIEWS COMPARISON:  None. FINDINGS: There is no evidence of fracture or other focal bone lesions. Numerous vascular calcifications are noted. IMPRESSION: 1. No acute radiographic abnormality of the left femur. 2. Atherosclerosis. Electronically Signed   By: Vinnie Langton M.D.   On: 08/13/2021 07:06    Assessment/Plan Malnutrition of mild degree (HCC) - Plan: Do not attempt resuscitation (DNR)  Moderate dementia without behavioral disturbance, psychotic  disturbance, mood disturbance, or anxiety, unspecified dementia type ([place]) Patient has had significant weight loss in the last month. Would be a candidate for hospice at this time given weight loss, however, family declined. Will continue [place]  conversations. Continue supplementation with protein shakes. Will also consider appetite stimulant with mirtazapine 7.5mg  nightly in upcoming weeks.  Decision making capacity: After obtaining the history from the patient and informant, reviewing the  cognitive testing and discussing the patient's understanding of their impairment and limitations, it is my  opinion that this patient no longer has decision making capacity and should rely on communication with her daughter and Jarold Song.    Family/ staff Communication: [name] and nursing  Labs/tests ordered:None    I spent greater 16 minutes and less than 30 minutes discussing goals of care with patient's daughgter. I spent 20 minutes in face to face time, chart review, care coordination, and nursing education for the care of this patient.   Tomasa Rand, MD, [facility] [phone]

## 2022-10-26 ENCOUNTER — Encounter: Payer: Self-pay | Admitting: Student

## 2022-10-29 LAB — CBC AND DIFFERENTIAL
HCT: 34 — AB (ref 36–46)
Hemoglobin: 11 — AB (ref 12.0–16.0)
Neutrophils Absolute: 3359
Platelets: 279 10*3/uL (ref 150–400)
WBC: 10.8

## 2022-10-29 LAB — CBC: RBC: 3.44 — AB (ref 3.87–5.11)

## 2022-11-16 ENCOUNTER — Other Ambulatory Visit: Payer: Self-pay | Admitting: Nurse Practitioner

## 2022-11-16 MED ORDER — TRAMADOL HCL 50 MG PO TABS: 50.0000 mg | ORAL_TABLET | Freq: Every day | ORAL | 0 refills | Status: DC

## 2022-11-24 ENCOUNTER — Encounter: Payer: Self-pay | Admitting: Nurse Practitioner

## 2022-11-24 ENCOUNTER — Non-Acute Institutional Stay (SKILLED_NURSING_FACILITY): Payer: Medicare Other | Admitting: Nurse Practitioner

## 2022-11-24 DIAGNOSIS — F03B0 Unspecified dementia, moderate, without behavioral disturbance, psychotic disturbance, mood disturbance, and anxiety: Secondary | ICD-10-CM

## 2022-11-24 DIAGNOSIS — K5909 Other constipation: Secondary | ICD-10-CM | POA: Diagnosis not present

## 2022-11-24 DIAGNOSIS — E441 Mild protein-calorie malnutrition: Secondary | ICD-10-CM | POA: Diagnosis not present

## 2022-11-24 DIAGNOSIS — N3281 Overactive bladder: Secondary | ICD-10-CM

## 2022-11-24 DIAGNOSIS — M159 Polyosteoarthritis, unspecified: Secondary | ICD-10-CM

## 2022-11-24 DIAGNOSIS — J301 Allergic rhinitis due to pollen: Secondary | ICD-10-CM

## 2022-11-24 NOTE — Progress Notes (Unsigned)
Location:  Other [place].  Nursing Home Room Number: [facility] 202A Place of Service:  SNF (31) Abbey Chatters, NP  PCP: Earnestine Mealing, MD  Patient Care Team: Earnestine Mealing, MD as PCP - General [facility])  Extended Emergency Contact Information Primary Emergency Contact: Dibens,Marcia  [place] Home Phone: [phone] Relation: Daughter  Goals of care: Advanced Directive information    11/24/2022   11:56 AM  Advanced Directives  Does Patient Have a Medical Advance Directive? Yes  Type of Advance Directive Out of facility DNR (pink MOST or yellow form)  Does patient want to make changes to medical advance directive? No - Patient declined     Chief Complaint  Patient presents with   Medical Management of Chronic Issues    Medical Management of Chronic Issues.     HPI:  Pt is a 89 y.o. female seen today for medical management of chronic disease. Pt with hx of dementia at [facility] for long term care.  She is doing well at this time. Nursing does not have any concerns.  Eating well at this time. Pt reports some allergy symptoms. She sees allergist yearly and has weekly injections.  Reports chronic arthritis pain but no worsen of symptoms.      Past Medical History:  Diagnosis Date   Allergic rhinitis due to pollen    some shellfish also   Aortic sclerosis 5/12   On echo. no stenosis   Arthritis    Diastolic dysfunction    stress echo otherwise normal 1/04. Repeat 5/11 also negative   History of seasonal allergies    Hx of basal cell carcinoma 1990   multiple sites   Hx of squamous cell carcinoma of skin 2000   R nasolabial fold   Hyperlipidemia    Osteoarthritis, multiple sites    Osteoporosis    Overactive bladder    Urge incontinence    Vaginal odor 04/2018   aerobic vaginitis on One Swab culture   Past Surgical History:  Procedure Laterality Date   CYSTOCELE REPAIR  2007   and rectocele   DILATION AND CURETTAGE OF  UTERUS     INGUINAL HERNIA REPAIR  2012   left side with mesh   REVERSE SHOULDER ARTHROPLASTY Right 04/27/2017   Procedure: REVERSE SHOULDER ARTHROPLASTY;  Surgeon: Christena Flake, MD;  Location: [facility] ORS;  Service: Orthopedics;  Laterality: Right;   TONSILLECTOMY AND ADENOIDECTOMY     as child   TOTAL HIP ARTHROPLASTY Right 05/23/2015   Procedure: TOTAL HIP ARTHROPLASTY ANTERIOR APPROACH;  Surgeon: Kennedy Bucker, MD;  Location: [facility] ORS;  Service: Orthopedics;  Laterality: Right;   UMBILICAL HERNIA REPAIR  02/04/06    Allergies  Allergen Reactions   Cat Hair Extract    Demerol [Meperidine]     dizzy   Dust Mite Extract    Shellfish Allergy    Avelox [Moxifloxacin Hcl In Nacl] Rash    Outpatient Encounter Medications as of 11/24/2022  Medication Sig   acetaminophen (TYLENOL) 325 MG tablet Take 650 mg by mouth every 6 (six) hours as needed.   acetaminophen (TYLENOL) 500 MG tablet Take 500 mg by mouth 3 (three) times daily.   aspirin EC 81 MG tablet Take 81 mg by mouth daily. Swallow whole.   brimonidine (ALPHAGAN) 0.2 % ophthalmic solution Place 1 drop into both eyes 2 (two) times daily.   diclofenac Sodium (VOLTAREN) 1 % GEL Apply 4 g topically 3 (three) times daily. To left knee see  other listing for shoulder use   diclofenac Sodium (VOLTAREN) 1 % GEL Apply 2 g topically every 8 (eight) hours as needed (to right shoulder).   dorzolamide-timolol (COSOPT) 22.3-6.8 MG/ML ophthalmic solution Apply 1 drop to eye 2 (two) times daily.   EPIPEN 2-PAK 0.3 MG/0.3ML SOAJ injection Inject into the muscle as needed for anaphylaxis (or allergic reaction).   fluticasone (FLONASE) 50 MCG/ACT nasal spray Place 1 spray into both nostrils 2 (two) times a day.   latanoprost (XALATAN) 0.005 % ophthalmic solution Place 1 drop into both eyes at bedtime.   levocetirizine (XYZAL) 5 MG tablet Take 5 mg by mouth every evening.   melatonin 3 MG TABS tablet Take 3 mg by mouth at bedtime.   mirabegron ER  (MYRBETRIQ) 25 MG TB24 tablet Take 25 mg by mouth daily.   mirtazapine (REMERON) 7.5 MG tablet Take 7.5 mg by mouth at bedtime.   Multiple Vitamin (MULTIVITAMIN) tablet Take 1 tablet by mouth daily.   polyethylene glycol (MIRALAX / GLYCOLAX) 17 g packet Take 17 g by mouth daily.   Probiotic Product (PROBIOTIC ADVANCED PO) Take 1 capsule by mouth daily.   Simethicone (GAS-X PO) Take by mouth 2 (two) times daily.   traMADol (ULTRAM) 50 MG tablet Take 1 tablet (50 mg total) by mouth at bedtime. Give one tablet by mouth every 8 hours as needed.   [DISCONTINUED] mirabegron ER (MYRBETRIQ) 50 MG TB24 tablet Take 25 mg by mouth daily.   [DISCONTINUED] nystatin [facility]) powder Apply to rash under left breast topically every 12 hours as needed for skin breakdown twice daily   No facility-administered encounter medications on file as of 11/24/2022.    Review of Systems  Constitutional:  Negative for activity change, appetite change, fatigue and unexpected weight change.  HENT:  Negative for congestion and hearing loss.   Eyes: Negative.   Respiratory:  Negative for cough and shortness of breath.   Cardiovascular:  Negative for chest pain, palpitations and leg swelling.  Gastrointestinal:  Negative for abdominal pain, constipation and diarrhea.  Genitourinary:  Negative for difficulty urinating and dysuria.  Musculoskeletal:  Negative for arthralgias and myalgias.  Skin:  Negative for color change and wound.  Neurological:  Negative for dizziness and weakness.  Psychiatric/Behavioral:  Negative for agitation, behavioral problems and confusion.      Immunization History  Administered Date(s) Administered   Covid-19, Mrna,Vaccine(Spikevax)55yrs and older 11/10/2022   Influenza, High Dose Seasonal PF 04/28/2017, 05/16/2018, 05/19/2022   Influenza, Seasonal, Injecte, Preservative Fre 05/28/2016   Influenza,inj,Quad PF,6+ Mos 05/25/2013, 05/07/2015   Influenza-Unspecified 04/24/2014, 05/20/2021    Moderna SARS-COV2 Booster Vaccination 06/14/2020, 12/20/2020, 12/30/2021   Moderna Sars-Covid-2 Vaccination 08/14/2019, 09/11/2019, 06/14/2020, 04/25/2021, 06/12/2022   Pfizer Covid-19 Vaccine Bivalent Booster 59yrs & up 04/25/2021   Pneumococcal Conjugate-13 02/07/2014   Pneumococcal Polysaccharide-23 02/15/2012, 03/07/2018   Td 09/12/2002, 02/06/2013   Zoster, Live 02/19/2016   Pertinent  Health Maintenance Due  Topic Date Due   INFLUENZA VACCINE  03/04/2023   DEXA SCAN  Completed      07/06/2021    9:20 PM 07/07/2021    9:35 AM 07/07/2021    8:00 PM 07/08/2021    8:09 AM 08/13/2021    6:31 AM  Fall Risk  (RETIRED) Patient Fall Risk Level Moderate fall risk High fall risk High fall risk High fall risk Moderate fall risk   Functional Status Survey:    Vitals:   11/24/22 1146  BP: 132/74  Pulse: 72  Resp: 20  Temp: 98 F (36.7 C)  SpO2: 95%  Weight: 139 lb 9.6 oz (63.3 kg)  Height: 5\' 6"  (1.676 m)   Body mass index is 22.53 kg/m. Physical Exam Constitutional:      General: She is not in acute distress.    Appearance: She is well-developed. She is not diaphoretic.  HENT:     Head: Normocephalic and atraumatic.     Mouth/Throat:     Pharynx: No oropharyngeal exudate.  Eyes:     Conjunctiva/sclera: Conjunctivae normal.     Pupils: Pupils are equal, round, and reactive to light.  Cardiovascular:     Rate and Rhythm: Normal rate and regular rhythm.     Heart sounds: Normal heart sounds.  Pulmonary:     Effort: Pulmonary effort is normal.     Breath sounds: Normal breath sounds.  Abdominal:     General: Bowel sounds are normal.     Palpations: Abdomen is soft.  Musculoskeletal:     Cervical back: Normal range of motion and neck supple.     Right lower leg: No edema.     Left lower leg: No edema.  Skin:    General: Skin is warm and dry.  Neurological:     Mental Status: She is alert.  Psychiatric:        Mood and Affect: Mood normal.     Labs  reviewed: Recent Labs    08/17/22 0000  NA 137  K 4.6  CL 104  CO2 28*  BUN 18  CREATININE 1.0  CALCIUM 9.0   Recent Labs    08/17/22 0000  AST 18  ALT 10  ALKPHOS 90  ALBUMIN 3.6   Recent Labs    08/17/22 0000 10/29/22 0000  WBC 8.3 10.8  NEUTROABS 2,747.00 3,359.00  HGB 12.4 11.0*  HCT 38 34*  PLT 253 279   Lab Results  Component Value Date   TSH 1.27 02/06/2013   No results found for: "HGBA1C" Lab Results  Component Value Date   CHOL 232 (H) 02/06/2013   HDL 77.50 02/06/2013   LDLDIRECT 138.4 02/06/2013   TRIG 72.0 02/06/2013   CHOLHDL 3 02/06/2013    Significant Diagnostic Results in last 30 days:  No results found.  Assessment/Plan 1. Malnutrition of mild degree -weight has been stable on remeron. Will continue.   2. Moderate dementia without behavioral disturbance, psychotic disturbance, mood disturbance, or anxiety, unspecified dementia type -Stable, no acute changes in cognitive or functional status, continue supportive care.   3. Overactive bladder Stable on myrbetriq, continue at this time.   4. Chronic constipation -stable on current regimen.   5. Primary osteoarthritis involving multiple joints Controlled on tylenol, has tramadol PRN for severe pain.   6. Seasonal allergic rhinitis due to pollen Stable, continues on xyzal, flonase and weekly injections.    Janene Harvey. Biagio Borg [facility] [phone]

## 2022-12-29 ENCOUNTER — Non-Acute Institutional Stay (SKILLED_NURSING_FACILITY): Payer: Medicare Other | Admitting: Nurse Practitioner

## 2022-12-29 ENCOUNTER — Encounter: Payer: Self-pay | Admitting: Nurse Practitioner

## 2022-12-29 DIAGNOSIS — J301 Allergic rhinitis due to pollen: Secondary | ICD-10-CM | POA: Diagnosis not present

## 2022-12-29 DIAGNOSIS — N3281 Overactive bladder: Secondary | ICD-10-CM

## 2022-12-29 DIAGNOSIS — K5909 Other constipation: Secondary | ICD-10-CM | POA: Diagnosis not present

## 2022-12-29 DIAGNOSIS — M159 Polyosteoarthritis, unspecified: Secondary | ICD-10-CM

## 2022-12-29 DIAGNOSIS — F01518 Vascular dementia, unspecified severity, with other behavioral disturbance: Secondary | ICD-10-CM

## 2022-12-29 DIAGNOSIS — D649 Anemia, unspecified: Secondary | ICD-10-CM

## 2022-12-29 DIAGNOSIS — I5032 Chronic diastolic (congestive) heart failure: Secondary | ICD-10-CM

## 2022-12-29 NOTE — Progress Notes (Signed)
Location:  [place].  Nursing Home Room Number: [facility] 202A Place of Service:  SNF (31) Abbey Chatters, NP  PCP: Earnestine Mealing, MD  Patient Care Team: Earnestine Mealing, MD as PCP - General [facility] Medicine)  Extended Emergency Contact Information Primary Emergency Contact: Dibens,Marcia  [place] Home Phone: [phone] Relation: Daughter  Goals of care: Advanced Directive information    12/29/2022    3:05 PM  Advanced Directives  Does Patient Have a Medical Advance Directive? Yes  Type of Advance Directive Out of facility DNR (pink MOST or yellow form)  Does patient want to make changes to medical advance directive? No - Patient declined     Chief Complaint  Patient presents with   Medical Management of Chronic Issues    Medical Management of Chronic Issues.     HPI:  Pt is a 89 y.o. female seen today for medical management of chronic disease.  Daughter reports she had more behaviors over the weekend. Looking for her husband who has passed.  She was upset over the weekend that she wanted to go home and paranoid but today she has been doing well.  Daughter reports Purnell Shoemaker acted like she did not trust them.  She was having a hard time calming her down but today she is much better.  Staff reports generally she does well and is able to be redirected Appetite is good on Remeron.   Occasional pain but managed with tylenol and tramadol at bedtime.  Weight is maintaing.     Past Medical History:  Diagnosis Date   Allergic rhinitis due to pollen    some shellfish also   Aortic sclerosis 5/12   On echo. no stenosis   Arthritis    Diastolic dysfunction    stress echo otherwise normal 1/04. Repeat 5/11 also negative   History of seasonal allergies    Hx of basal cell carcinoma 1990   multiple sites   Hx of squamous cell carcinoma of skin 2000   R nasolabial fold   Hyperlipidemia    Osteoarthritis, multiple sites    Osteoporosis     Overactive bladder    Urge incontinence    Vaginal odor 04/2018   aerobic vaginitis on One Swab culture   Past Surgical History:  Procedure Laterality Date   CYSTOCELE REPAIR  2007   and rectocele   DILATION AND CURETTAGE OF UTERUS     INGUINAL HERNIA REPAIR  2012   left side with mesh   REVERSE SHOULDER ARTHROPLASTY Right 04/27/2017   Procedure: REVERSE SHOULDER ARTHROPLASTY;  Surgeon: Christena Flake, MD;  Location: [facility] ORS;  Service: Orthopedics;  Laterality: Right;   TONSILLECTOMY AND ADENOIDECTOMY     as child   TOTAL HIP ARTHROPLASTY Right 05/23/2015   Procedure: TOTAL HIP ARTHROPLASTY ANTERIOR APPROACH;  Surgeon: Kennedy Bucker, MD;  Location: [facility] ORS;  Service: Orthopedics;  Laterality: Right;   UMBILICAL HERNIA REPAIR  02/04/06    Allergies  Allergen Reactions   Cat Hair Extract    Demerol [Meperidine]     dizzy   Dust Mite Extract    Shellfish Allergy    Avelox [Moxifloxacin Hcl In Nacl] Rash    Outpatient Encounter Medications as of 12/29/2022  Medication Sig   acetaminophen (TYLENOL) 325 MG tablet Take 650 mg by mouth every 6 (six) hours as needed.   acetaminophen (TYLENOL) 500 MG tablet Take 500 mg by mouth 3 (three) times daily.   aspirin EC 81 MG tablet Take 81  mg by mouth daily. Swallow whole.   brimonidine (ALPHAGAN) 0.2 % ophthalmic solution Place 1 drop into both eyes 2 (two) times daily.   diclofenac Sodium (VOLTAREN) 1 % GEL Apply 4 g topically 3 (three) times daily. To left knee see other listing for shoulder use   diclofenac Sodium (VOLTAREN) 1 % GEL Apply 2 g topically every 8 (eight) hours as needed (to right shoulder).   dorzolamide-timolol (COSOPT) 22.3-6.8 MG/ML ophthalmic solution Apply 1 drop to eye 2 (two) times daily.   EPIPEN 2-PAK 0.3 MG/0.3ML SOAJ injection Inject into the muscle as needed for anaphylaxis (or allergic reaction).   fluticasone (FLONASE) 50 MCG/ACT nasal spray Place 1 spray into both nostrils 2 (two) times a day.   latanoprost  (XALATAN) 0.005 % ophthalmic solution Place 1 drop into both eyes at bedtime.   levocetirizine (XYZAL) 5 MG tablet Take 5 mg by mouth every evening.   melatonin 3 MG TABS tablet Take 3 mg by mouth at bedtime.   mirtazapine (REMERON) 7.5 MG tablet Take 7.5 mg by mouth at bedtime.   Multiple Vitamin (MULTIVITAMIN) tablet Take 1 tablet by mouth daily.   polyethylene glycol (MIRALAX / GLYCOLAX) 17 g packet Take 17 g by mouth daily.   Probiotic Product (PROBIOTIC ADVANCED PO) Take 1 capsule by mouth daily.   Simethicone (GAS-X PO) Take by mouth 2 (two) times daily.   traMADol (ULTRAM) 50 MG tablet Take 1 tablet (50 mg total) by mouth at bedtime. Give one tablet by mouth every 8 hours as needed.   [DISCONTINUED] mirabegron ER (MYRBETRIQ) 25 MG TB24 tablet Take 25 mg by mouth daily.   No facility-administered encounter medications on file as of 12/29/2022.    Review of Systems  Constitutional:  Negative for activity change, appetite change, fatigue and unexpected weight change.  HENT:  Negative for congestion and hearing loss.   Eyes: Negative.   Respiratory:  Negative for cough and shortness of breath.   Cardiovascular:  Negative for chest pain, palpitations and leg swelling.  Gastrointestinal:  Negative for abdominal pain, constipation and diarrhea.  Genitourinary:  Positive for frequency. Negative for difficulty urinating and dysuria.  Musculoskeletal:  Negative for arthralgias and myalgias.  Skin:  Negative for color change and wound.  Allergic/Immunologic: Positive for environmental allergies.  Neurological:  Negative for dizziness and weakness.  Psychiatric/Behavioral:  Positive for confusion. Negative for agitation and behavioral problems.      Immunization History  Administered Date(s) Administered   Covid-19, Mrna,Vaccine(Spikevax)72yrs and older 11/10/2022   Influenza, High Dose Seasonal PF 04/28/2017, 05/16/2018, 05/19/2022   Influenza, Seasonal, Injecte, Preservative Fre  05/28/2016   Influenza,inj,Quad PF,6+ Mos 05/25/2013, 05/07/2015   Influenza-Unspecified 04/24/2014, 05/20/2021   Moderna SARS-COV2 Booster Vaccination 06/14/2020, 12/20/2020, 12/30/2021   Moderna Sars-Covid-2 Vaccination 08/14/2019, 09/11/2019, 06/14/2020, 04/25/2021, 06/12/2022   Pfizer Covid-19 Vaccine Bivalent Booster 55yrs & up 04/25/2021   Pneumococcal Conjugate-13 02/07/2014   Pneumococcal Polysaccharide-23 02/15/2012, 03/07/2018   Td 09/12/2002, 02/06/2013   Zoster, Live 02/19/2016   Pertinent  Health Maintenance Due  Topic Date Due   INFLUENZA VACCINE  03/04/2023   DEXA SCAN  Completed      07/06/2021    9:20 PM 07/07/2021    9:35 AM 07/07/2021    8:00 PM 07/08/2021    8:09 AM 08/13/2021    6:31 AM  Fall Risk  (RETIRED) Patient Fall Risk Level Moderate fall risk High fall risk High fall risk High fall risk Moderate fall risk   Functional Status Survey:  Vitals:   12/29/22 1452  BP: 109/68  Pulse: 71  Resp: 18  Temp: (!) 97 F (36.1 C)  SpO2: 91%  Weight: 143 lb 6.4 oz (65 kg)  Height: 5\' 6"  (1.676 m)   Body mass index is 23.15 kg/m. Physical Exam Constitutional:      General: She is not in acute distress.    Appearance: She is well-developed. She is not diaphoretic.  HENT:     Head: Normocephalic and atraumatic.     Mouth/Throat:     Pharynx: No oropharyngeal exudate.  Eyes:     Conjunctiva/sclera: Conjunctivae normal.     Pupils: Pupils are equal, round, and reactive to light.  Cardiovascular:     Rate and Rhythm: Normal rate and regular rhythm.     Heart sounds: Normal heart sounds.  Pulmonary:     Effort: Pulmonary effort is normal.     Breath sounds: Normal breath sounds.  Abdominal:     General: Bowel sounds are normal.     Palpations: Abdomen is soft.  Musculoskeletal:     Cervical back: Normal range of motion and neck supple.     Right lower leg: No edema.     Left lower leg: No edema.  Skin:    General: Skin is warm and dry.   Neurological:     Mental Status: She is alert. Mental status is at baseline.     Motor: Weakness present.     Gait: Gait abnormal.  Psychiatric:        Mood and Affect: Mood normal.     Labs reviewed: Recent Labs    08/17/22 0000  NA 137  K 4.6  CL 104  CO2 28*  BUN 18  CREATININE 1.0  CALCIUM 9.0   Recent Labs    08/17/22 0000  AST 18  ALT 10  ALKPHOS 90  ALBUMIN 3.6   Recent Labs    08/17/22 0000 10/29/22 0000  WBC 8.3 10.8  NEUTROABS 2,747.00 3,359.00  HGB 12.4 11.0*  HCT 38 34*  PLT 253 279   Lab Results  Component Value Date   TSH 1.27 02/06/2013   No results found for: "HGBA1C" Lab Results  Component Value Date   CHOL 232 (H) 02/06/2013   HDL 77.50 02/06/2013   LDLDIRECT 138.4 02/06/2013   TRIG 72.0 02/06/2013   CHOLHDL 3 02/06/2013    Significant Diagnostic Results in last 30 days:  No results found.  Assessment/Plan 1. Chronic constipation Controlled on current regimen  2. Primary osteoarthritis involving multiple joints Ongoing but stable, continues on scheduled tylenol and scheduled tramadol qhs.   3. Overactive bladder Continues to have frequency but stable on myrbetriq  4. Seasonal allergic rhinitis due to pollen -ongoing, continues on xzyal and injections  5. Chronic diastolic heart failure (HCC) Euvolemic at this time. Without shortness of breath or chest pains.   6. Vascular dementia with behavior disturbance (HCC) -worsening behaviors over the weekend but has since improved. Will continue to monitor.  Continue with support from staff.   7. Anemia, unspecified type -noted on recent lab, will follow up cbc    Numa Heatwole K. Biagio Borg [facility] [phone]

## 2022-12-31 ENCOUNTER — Encounter: Payer: Self-pay | Admitting: Nurse Practitioner

## 2022-12-31 ENCOUNTER — Non-Acute Institutional Stay (INDEPENDENT_AMBULATORY_CARE_PROVIDER_SITE_OTHER): Payer: Medicare Other | Admitting: Nurse Practitioner

## 2022-12-31 DIAGNOSIS — Z0000 Encounter for general adult medical examination without abnormal findings: Secondary | ICD-10-CM

## 2022-12-31 LAB — CBC AND DIFFERENTIAL
HCT: 37 (ref 36–46)
Hemoglobin: 12.6 (ref 12.0–16.0)
Neutrophils Absolute: 3881
Platelets: 280 10*3/uL (ref 150–400)
WBC: 8.8

## 2022-12-31 LAB — CBC: RBC: 3.78 — AB (ref 3.87–5.11)

## 2022-12-31 NOTE — Progress Notes (Signed)
Subjective:   [name] [name] is a 89 y.o. female who presents for Medicare Annual (Subsequent) preventive examination.  Review of Systems     Cardiac Risk Factors include: advanced age (>21men, >7 women);sedentary lifestyle     Objective:    Today's Vitals   12/31/22 1507  BP: 109/68  Pulse: 71  Weight: 143 lb 3.2 oz (65 kg)  Height: 5\' 6"  (1.676 m)  PainSc: 0-No pain   Body mass index is 23.11 kg/m.     12/31/2022    3:12 PM 12/29/2022    3:05 PM 11/24/2022   11:56 AM 10/16/2022   10:14 AM 08/28/2022    2:02 PM 08/13/2022   12:55 PM 07/20/2022   11:29 AM  Advanced Directives  Does Patient Have a Medical Advance Directive? Yes Yes Yes No No No No  Type of Advance Directive Out of facility DNR (pink MOST or yellow form) Out of facility DNR (pink MOST or yellow form) Out of facility DNR (pink MOST or yellow form)      Does patient want to make changes to medical advance directive? No - Patient declined No - Patient declined No - Patient declined No - Patient declined No - Patient declined No - Patient declined No - Patient declined  Pre-existing out of facility DNR order (yellow form or pink MOST form) Yellow form placed in chart (order not valid for inpatient use)          Current Medications (verified) Outpatient Encounter Medications as of 12/31/2022  Medication Sig   acetaminophen (TYLENOL) 325 MG tablet Take 650 mg by mouth every 6 (six) hours as needed.   acetaminophen (TYLENOL) 500 MG tablet Take 500 mg by mouth 3 (three) times daily.   aspirin EC 81 MG tablet Take 81 mg by mouth daily. Swallow whole.   brimonidine (ALPHAGAN) 0.2 % ophthalmic solution Place 1 drop into both eyes 2 (two) times daily.   diclofenac Sodium (VOLTAREN) 1 % GEL Apply 4 g topically 3 (three) times daily. To left knee see other listing for shoulder use   diclofenac Sodium (VOLTAREN) 1 % GEL Apply 2 g topically every 8 (eight) hours as needed (to right shoulder).   dorzolamide-timolol  (COSOPT) 22.3-6.8 MG/ML ophthalmic solution Apply 1 drop to eye 2 (two) times daily.   EPIPEN 2-PAK 0.3 MG/0.3ML SOAJ injection Inject into the muscle as needed for anaphylaxis (or allergic reaction).   fluticasone (FLONASE) 50 MCG/ACT nasal spray Place 1 spray into both nostrils 2 (two) times a day.   latanoprost (XALATAN) 0.005 % ophthalmic solution Place 1 drop into both eyes at bedtime.   levocetirizine (XYZAL) 5 MG tablet Take 5 mg by mouth every evening.   melatonin 3 MG TABS tablet Take 3 mg by mouth at bedtime.   mirtazapine (REMERON) 7.5 MG tablet Take 7.5 mg by mouth at bedtime.   Multiple Vitamin (MULTIVITAMIN) tablet Take 1 tablet by mouth daily.   polyethylene glycol (MIRALAX / GLYCOLAX) 17 g packet Take 17 g by mouth daily.   Probiotic Product (PROBIOTIC ADVANCED PO) Take 1 capsule by mouth daily.   Simethicone (GAS-X PO) Take by mouth 2 (two) times daily.   traMADol (ULTRAM) 50 MG tablet Take 1 tablet (50 mg total) by mouth at bedtime. Give one tablet by mouth every 8 hours as needed.   No facility-administered encounter medications on file as of 12/31/2022.    Allergies (verified) Cat hair extract, Demerol [meperidine], Dust mite extract, Shellfish allergy, and Avelox [moxifloxacin  hcl in nacl]   History: Past Medical History:  Diagnosis Date   Allergic rhinitis due to pollen    some shellfish also   Aortic sclerosis 5/12   On echo. no stenosis   Arthritis    Diastolic dysfunction    stress echo otherwise normal 1/04. Repeat 5/11 also negative   History of seasonal allergies    Hx of basal cell carcinoma 1990   multiple sites   Hx of squamous cell carcinoma of skin 2000   R nasolabial fold   Hyperlipidemia    Osteoarthritis, multiple sites    Osteoporosis    Overactive bladder    Urge incontinence    Vaginal odor 04/2018   aerobic vaginitis on One Swab culture   Past Surgical History:  Procedure Laterality Date   CYSTOCELE REPAIR  2007   and rectocele    DILATION AND CURETTAGE OF UTERUS     INGUINAL HERNIA REPAIR  2012   left side with mesh   REVERSE SHOULDER ARTHROPLASTY Right 04/27/2017   Procedure: REVERSE SHOULDER ARTHROPLASTY;  Surgeon: Christena Flake, MD;  Location: [facility];  Service: Orthopedics;  Laterality: Right;   TONSILLECTOMY AND ADENOIDECTOMY     as child   TOTAL HIP ARTHROPLASTY Right 05/23/2015   Procedure: TOTAL HIP ARTHROPLASTY ANTERIOR APPROACH;  Surgeon: Kennedy Bucker, MD;  Location: [facility];  Service: Orthopedics;  Laterality: Right;   UMBILICAL HERNIA REPAIR  02/04/06   Family History  Problem Relation Age of Onset   Cancer Father 84       colon cancer   Heart disease Father    Social History   Socioeconomic History   Marital status: Widowed    Spouse name: Not on file   Number of children: 3   Years of education: Not on file   Highest education level: Not on file  Occupational History   Occupation: Runner, broadcasting/film/video    Comment: short time   Occupation: Systems developer    Comment: most of career  Tobacco Use   Smoking status: Former   Smokeless tobacco: Never  Building services engineer Use: Never used  Substance and Sexual Activity   Alcohol use: Yes    Comment: wine before dinner   Drug use: No   Sexual activity: Not Currently    Birth control/protection: Post-menopausal  Other Topics Concern   Not on file  Social History Narrative   Widowed 2019   Has living will   Daughter Leta Jungling, is health care POA   Would accept trial of resuscitation   Would probably accept feeding tube   Social Determinants of Health   Financial Resource Strain: Not on file  Food Insecurity: Not on file  Transportation Needs: Not on file  Physical Activity: Not on file  Stress: Not on file  Social Connections: Not on file    Tobacco Counseling Counseling given: Not Answered   Clinical Intake:  Pre-visit preparation completed: Yes  Pain : No/denies pain Pain Score: 0-No pain     BMI - recorded: 23 Nutritional Risks:  None Diabetes: No  How often do you need to have someone help you when you read instructions, pamphlets, or other written materials from your doctor or pharmacy?: 5 - Always  Diabetic?no         Activities of Daily Living    12/31/2022    2:59 PM  In your present state of health, do you have any difficulty performing the following activities:  Hearing? 0  Vision? 0  Difficulty concentrating or making decisions? 1  Walking or climbing stairs? 1  Dressing or bathing? 1  Doing errands, shopping? 1  Preparing Food and eating ? Y  Using the Toilet? Y  In the past six months, have you accidently leaked urine? Y  Do you have problems with loss of bowel control? N  Managing your Medications? Y  Managing your Finances? Y  Housekeeping or managing your Housekeeping? Y    Patient Care Team: Earnestine Mealing, MD as PCP - General (Family Medicine)  Indicate any recent Medical Services you may have received from other than [facility] providers in the past year (date may be approximate).     Assessment:   This is a routine wellness examination for [place].  Hearing/Vision screen No results found.  Dietary issues and exercise activities discussed: Current Exercise Habits: The patient does not participate in regular exercise at present   Goals Addressed   None    Depression Screen    03/07/2018    3:45 PM 03/01/2017    3:55 PM 02/13/2015   10:08 AM 02/07/2014   10:15 AM 02/06/2013   10:49 AM  PHQ 2/9 Scores  PHQ - 2 Score 1 0 0 0 0    Fall Risk    12/31/2022    2:58 PM 06/28/2019   10:00 AM 03/07/2018    3:45 PM 03/01/2017    3:55 PM 04/03/2016    1:50 PM  Fall Risk   Falls in the past year? 1 1 Yes Yes Yes  Comment  Emmi Telephone Survey: data to providers prior to load   Emmi Telephone Survey: data to providers prior to load  Number falls in past yr: 1 1 2  or more 1 1  Comment  Emmi Telephone Survey Actual Response = 1   Emmi Telephone Survey Actual Response = 1  Injury with  Fall?  1 No No No  Risk Factor Category    High Fall Risk    Risk for fall due to : History of fall(s);Impaired balance/gait      Follow up   Falls prevention discussed      FALL RISK PREVENTION PERTAINING TO THE HOME:  Any stairs in or around the home? No  If so, are there any without handrails? No  Home free of loose throw rugs in walkways, pet beds, electrical cords, etc? Yes  Adequate lighting in your home to reduce risk of falls? Yes   ASSISTIVE DEVICES UTILIZED TO PREVENT FALLS:  Life alert? No  Use of a cane, walker or w/c? Yes  Grab bars in the bathroom? Yes  Shower chair or bench in shower? Yes  Elevated toilet seat or a handicapped toilet? Yes   TIMED UP AND GO:  Was the test performed? No .    Cognitive Function:        Immunizations Immunization History  Administered Date(s) Administered   Covid-19, Mrna,Vaccine(Spikevax)25yrs and older 11/10/2022   Influenza, High Dose Seasonal PF 04/28/2017, 05/16/2018, 05/19/2022   Influenza, Seasonal, Injecte, Preservative Fre 05/28/2016   Influenza,inj,Quad PF,6+ Mos 05/25/2013, 05/07/2015   Influenza-Unspecified 04/24/2014, 05/20/2021   Moderna SARS-COV2 Booster Vaccination 06/14/2020, 12/20/2020, 12/30/2021   Moderna Sars-Covid-2 Vaccination 08/14/2019, 09/11/2019, 06/14/2020, 04/25/2021, 06/12/2022   Pfizer Covid-19 Vaccine Bivalent Booster 42yrs & up 04/25/2021   Pneumococcal Conjugate-13 02/07/2014   Pneumococcal Polysaccharide-23 02/15/2012, 03/07/2018   Td 09/12/2002, 02/06/2013   Zoster, Live 02/19/2016    TDAP status: Up to date  Flu Vaccine status: Up to date  Pneumococcal  vaccine status: Up to date  Covid-19 vaccine status: Information provided on how to obtain vaccines.   Qualifies for Shingles Vaccine? Yes   Zostavax completed No   Shingrix Completed?: No.    Education has been provided regarding the importance of this vaccine. Patient has been advised to call insurance company to determine out  of pocket expense if they have not yet received this vaccine. Advised may also receive vaccine at local pharmacy or Health Dept. Verbalized acceptance and understanding.  Screening Tests Health Maintenance  Topic Date Due   DTaP/Tdap/Td (3 - Tdap) 02/07/2023   INFLUENZA VACCINE  03/04/2023   Medicare Annual Wellness (AWV)  12/31/2023   Pneumonia Vaccine 31+ Years old  Completed   DEXA SCAN  Completed   COVID-19 Vaccine  Completed   HPV VACCINES  Aged Out   Zoster Vaccines- Shingrix  Discontinued    Health Maintenance  There are no preventive care reminders to display for this patient.   Colorectal cancer screening: No longer required.   Mammogram status: No longer required due to age.   Lung Cancer Screening: (Low Dose CT Chest recommended if Age 35-80 years, 30 pack-year currently smoking OR have quit w/in 15years.) does not qualify.   Lung Cancer Screening Referral: na  Additional Screening:  Hepatitis C Screening: does not qualify; Completed na  Vision Screening: Recommended annual ophthalmology exams for early detection of glaucoma and other disorders of the eye. Is the patient up to date with their annual eye exam?  No  Due to dementia Dental Screening: Recommended annual dental exams for proper oral hygiene  Community Resource Referral / Chronic Care Management: CRR required this visit?  No   CCM required this visit?  No      Plan:     I have personally reviewed and noted the following in the patient's chart:   Medical and social history Use of alcohol, tobacco or illicit drugs  Current medications and supplements including opioid prescriptions. Patient is not currently taking opioid prescriptions. Functional ability and status Nutritional status Physical activity Advanced directives List of other physicians Hospitalizations, surgeries, and ER visits in previous 12 months Vitals Screenings to include cognitive, depression, and falls Referrals and  appointments  In addition, I have reviewed and discussed with patient certain preventive protocols, quality metrics, and best practice recommendations. A written personalized care plan for preventive services as well as general preventive health recommendations were provided to patient.     Sharon Seller, NP   12/31/2022   Place of service: [facility]

## 2023-01-11 ENCOUNTER — Other Ambulatory Visit: Payer: Self-pay | Admitting: Student

## 2023-01-11 DIAGNOSIS — M7581 Other shoulder lesions, right shoulder: Secondary | ICD-10-CM

## 2023-01-11 MED ORDER — TRAMADOL HCL 50 MG PO TABS: 50.0000 mg | ORAL_TABLET | Freq: Every day | ORAL | 0 refills | Status: DC

## 2023-01-11 NOTE — Progress Notes (Signed)
Refill chronic pain medication.  

## 2023-02-05 ENCOUNTER — Encounter: Payer: Self-pay | Admitting: Student

## 2023-02-05 ENCOUNTER — Non-Acute Institutional Stay (SKILLED_NURSING_FACILITY): Payer: Medicare Other | Admitting: Student

## 2023-02-05 DIAGNOSIS — I5032 Chronic diastolic (congestive) heart failure: Secondary | ICD-10-CM

## 2023-02-05 DIAGNOSIS — N3281 Overactive bladder: Secondary | ICD-10-CM

## 2023-02-05 DIAGNOSIS — F03B0 Unspecified dementia, moderate, without behavioral disturbance, psychotic disturbance, mood disturbance, and anxiety: Secondary | ICD-10-CM | POA: Diagnosis not present

## 2023-02-05 DIAGNOSIS — F5101 Primary insomnia: Secondary | ICD-10-CM

## 2023-02-05 DIAGNOSIS — M7581 Other shoulder lesions, right shoulder: Secondary | ICD-10-CM

## 2023-02-05 DIAGNOSIS — K5909 Other constipation: Secondary | ICD-10-CM | POA: Diagnosis not present

## 2023-02-05 DIAGNOSIS — E441 Mild protein-calorie malnutrition: Secondary | ICD-10-CM | POA: Diagnosis not present

## 2023-02-05 NOTE — Progress Notes (Signed)
[name] Room Number: 202 A Place of Service:  SNF (31) Provider:  Earnestine Mealing, MD  Patient Care Team: Earnestine Mealing, MD as PCP - General (Family Medicine)  Extended Emergency Contact Information Primary Emergency Contact: Dibens,Marcia  [place] Home Phone: [phone] Relation: Daughter  Code Status:   Goals of care: Advanced Directive information    02/05/2023   10:44 AM  Advanced Directives  Does Patient Have a Medical Advance Directive? Yes  Type of Advance Directive Out of facility DNR (pink MOST or yellow form)  Does patient want to make changes to medical advance directive? No - Patient declined  Pre-existing out of facility DNR order (yellow form or pink MOST form) Yellow form placed in chart (order not valid for inpatient use)     Chief Complaint  Patient presents with   Medical Management of Chronic Issues    Routine visit    HPI:  Pt is a 89 y.o. female seen today for medical management of chronic diseases.   Patient states she is doing well. She states it is 2020 and she can't give the month. When prompted she states, "Oh, we missed the fourth." She has had more falls in recent years so is less active. Denies chest pain, shortness of breath. Endorses periodic abdominal pain. Denies dysuria at this time.  She states she has three daughters and 6 grand children ~4 great grand children.   Nursing without concerns at this time.  Past Medical History:  Diagnosis Date   Allergic rhinitis due to pollen    some shellfish also   Aortic sclerosis 5/12   On echo. no stenosis   Arthritis    Diastolic dysfunction    stress echo otherwise normal 1/04. Repeat 5/11 also negative   History of seasonal allergies    Hx of basal cell carcinoma 1990   multiple sites   Hx of squamous cell carcinoma of skin 2000   R nasolabial fold   Hyperlipidemia    Osteoarthritis, multiple sites    Osteoporosis    Overactive  bladder    Urge incontinence    Vaginal odor 04/2018   aerobic vaginitis on One Swab culture   Past Surgical History:  Procedure Laterality Date   CYSTOCELE REPAIR  2007   and rectocele   DILATION AND CURETTAGE OF UTERUS     INGUINAL HERNIA REPAIR  2012   left side with mesh   REVERSE SHOULDER ARTHROPLASTY Right 04/27/2017   Procedure: REVERSE SHOULDER ARTHROPLASTY;  Surgeon: Christena Flake, MD;  Location: [facility] OR[facility];  Service: Orthopedics;  Laterality: Right;   TONSILLECTOMY AND ADENOIDECTOMY     as child   TOTAL HIP ARTHROPLASTY Right 05/23/2015   Procedure: TOTAL HIP ARTHROPLASTY ANTERIOR APPROACH;  Surgeon: Kennedy Bucker, MD;  Location: [facility] OR[facility];  Service: Orthopedics;  Laterality: Right;   UMBILICAL HERNIA REPAIR  02/04/06    Allergies  Allergen Reactions   Cat Hair Extract    Demerol [Meperidine]     dizzy   Dust Mite Extract    Shellfish Allergy    Avelox [Moxifloxacin Hcl In Nacl] Rash    Outpatient Encounter Medications as of 02/05/2023  Medication Sig   acetaminophen (TYLENOL) 500 MG tablet Take 500 mg by mouth 3 (three) times daily.   aspirin EC 81 MG tablet Take 81 mg by mouth daily. Swallow whole.   brimonidine (ALPHAGAN) 0.2 % ophthalmic solution Place 1 drop into both eyes 2 (two) times daily.  diclofenac Sodium (VOLTAREN) 1 % GEL Apply 4 g topically 3 (three) times daily. To left knee see other listing for shoulder use   diclofenac Sodium (VOLTAREN) 1 % GEL Apply 2 g topically every 8 (eight) hours as needed (to right shoulder).   dorzolamide-timolol (COSOPT) 22.3-6.8 MG/ML ophthalmic solution Apply 1 drop to eye 2 (two) times daily.   EPIPEN 2-PAK 0.3 MG/0.3ML SOAJ injection Inject into the muscle as needed for anaphylaxis (or allergic reaction).   fluticasone (FLONASE) 50 MCG/ACT nasal spray Place 1 spray into both nostrils 2 (two) times a day.   latanoprost (XALATAN) 0.005 % ophthalmic solution Place 1 drop into both eyes at bedtime.   levocetirizine (XYZAL) 5  MG tablet Take 5 mg by mouth every evening.   melatonin 3 MG TABS tablet Take 3 mg by mouth at bedtime.   mirtazapine (REMERON) 7.5 MG tablet Take 7.5 mg by mouth at bedtime.   Multiple Vitamin (MULTIVITAMIN) tablet Take 1 tablet by mouth daily.   Nutritional Supplements (ENSURE CLEAR) LIQD Take 1 Container by mouth with breakfast, with lunch, and with evening meal.   polyethylene glycol (MIRALAX / GLYCOLAX) 17 g packet Take 17 g by mouth daily.   Probiotic Product (PROBIOTIC ADVANCED PO) Take 1 capsule by mouth daily.   Simethicone (GAS-X PO) Take by mouth 2 (two) times daily.   traMADol (ULTRAM) 50 MG tablet Take 1 tablet (50 mg total) by mouth at bedtime. Give one tablet by mouth every 8 hours as needed.   [DISCONTINUED] acetaminophen (TYLENOL) 325 MG tablet Take 650 mg by mouth every 6 (six) hours as needed.   No facility-administered encounter medications on file as of 02/05/2023.    Review of Systems  Immunization History  Administered Date(s) Administered   Covid-19, Mrna,Vaccine(Spikevax)87yrs and older 11/10/2022   Influenza, High Dose Seasonal PF 04/28/2017, 05/16/2018, 05/19/2022   Influenza, Seasonal, Injecte, Preservative Fre 05/28/2016   Influenza,inj,Quad PF,6+ Mos 05/25/2013, 05/07/2015   Influenza-Unspecified 04/24/2014, 05/20/2021   Moderna SARS-COV2 Booster Vaccination 06/14/2020, 12/20/2020, 12/30/2021   Moderna Sars-Covid-2 Vaccination 08/14/2019, 09/11/2019, 06/14/2020, 04/25/2021, 06/12/2022   Pfizer Covid-19 Vaccine Bivalent Booster 16yrs & up 04/25/2021   Pneumococcal Conjugate-13 02/07/2014   Pneumococcal Polysaccharide-23 02/15/2012, 03/07/2018   Td 09/12/2002, 02/06/2013   Zoster, Live 02/19/2016   Pertinent  Health Maintenance Due  Topic Date Due   INFLUENZA VACCINE  03/04/2023   DEXA SCAN  Completed      07/07/2021    9:35 AM 07/07/2021    8:00 PM 07/08/2021    8:09 AM 08/13/2021    6:31 AM 12/31/2022    2:58 PM  Fall Risk  Falls in the past year?      1  (RETIRED) Patient Fall Risk Level High fall risk High fall risk High fall risk Moderate fall risk   Patient at Risk for Falls Due to     History of fall(s);Impaired balance/gait   Functional Status Survey:    Vitals:   02/05/23 1043  BP: 118/66  Pulse: 72  Weight: 141 lb 3.2 oz (64 kg)  Height: 5\' 6"  (1.676 m)   Body mass index is 22.79 kg/m. Physical Exam Constitutional:      Comments: thin  Cardiovascular:     Rate and Rhythm: Normal rate.     Pulses: Normal pulses.  Pulmonary:     Effort: Pulmonary effort is normal.  Abdominal:     General: Abdomen is flat.     Palpations: Abdomen is soft.  Neurological:  General: No focal deficit present.     Mental Status: She is alert. She is disoriented.     Labs reviewed: Recent Labs    08/17/22 0000  NA 137  K 4.6  CL 104  CO2 28*  BUN 18  CREATININE 1.0  CALCIUM 9.0   Recent Labs    08/17/22 0000  AST 18  ALT 10  ALKPHOS 90  ALBUMIN 3.6   Recent Labs    08/17/22 0000 10/29/22 0000 12/31/22 0000  WBC 8.3 10.8 8.8  NEUTROABS 2,747.00 3,359.00 3,881.00  HGB 12.4 11.0* 12.6  HCT 38 34* 37  PLT 253 279 280   Lab Results  Component Value Date   TSH 1.27 02/06/2013   No results found for: "HGBA1C" Lab Results  Component Value Date   CHOL 232 (H) 02/06/2013   HDL 77.50 02/06/2013   LDLDIRECT 138.4 02/06/2013   TRIG 72.0 02/06/2013   CHOLHDL 3 02/06/2013    Significant Diagnostic Results in last 30 days:  No results found.  Assessment/Plan Moderate dementia without behavioral disturbance, psychotic disturbance, mood disturbance, or anxiety, unspecified dementia type (HCC)  Chronic constipation  Chronic diastolic heart failure (HCC)  Malnutrition of mild degree (HCC)  Overactive bladder Patient continues to have decline in memory. Less orientation than previously. Her weight has been stable in recent months since starting mirtazapine, however, she has had more hallucinations per her  children. At this time nursing and patient state there is no distress. She now takes crushed pills which has rquired some medication adjustments.  Will continue to monitor for progression and offer additional care services as indicated. Continue NH level of care.  Continue ASA. Patient appears euvolemic on exam. Continue ultram for pain. Doing well without myrbetriq.   Family/ staff Communication: nursing  Labs/tests ordered:  none

## 2023-03-26 ENCOUNTER — Telehealth: Payer: Self-pay | Admitting: Orthopedic Surgery

## 2023-03-26 ENCOUNTER — Telehealth: Payer: Medicare Other | Admitting: Orthopedic Surgery

## 2023-03-26 NOTE — Telephone Encounter (Signed)
[facility] nursing called back to report blood glucose 126, blood pressure 145/71, and O2 sat 93% on RA. She is now taking and at baseline. No new orders at this time. Advised nursing staff to monitor.

## 2023-03-26 NOTE — Telephone Encounter (Signed)
[facility] nursing calls to report moment of unresponsiveness < 10 minutes. She was aroused to sternal chest rub, but unable to open eyes. Hand grips were symmetrical. Vitals : 87/55, 74, 90, did not get O2 sat. She was given dose of Tramadol within last 2 hours. No recent injury or fall. H/o moderate dementia. Advised nursing to repeat blood pressure, obtain O2 sat and blood sugar and notify oncall provider. Patient is DNR.

## 2023-03-29 ENCOUNTER — Encounter: Payer: Self-pay | Admitting: Student

## 2023-04-06 NOTE — Progress Notes (Signed)
A user error has taken place: encounter opened in error, closed for administrative reasons.

## 2023-04-16 ENCOUNTER — Non-Acute Institutional Stay (SKILLED_NURSING_FACILITY): Payer: Medicare Other | Admitting: Student

## 2023-04-16 ENCOUNTER — Encounter: Payer: Self-pay | Admitting: Student

## 2023-04-16 DIAGNOSIS — K219 Gastro-esophageal reflux disease without esophagitis: Secondary | ICD-10-CM

## 2023-04-16 DIAGNOSIS — E441 Mild protein-calorie malnutrition: Secondary | ICD-10-CM | POA: Diagnosis not present

## 2023-04-16 DIAGNOSIS — F01518 Vascular dementia, unspecified severity, with other behavioral disturbance: Secondary | ICD-10-CM | POA: Diagnosis not present

## 2023-04-16 DIAGNOSIS — I5032 Chronic diastolic (congestive) heart failure: Secondary | ICD-10-CM

## 2023-04-16 DIAGNOSIS — K5909 Other constipation: Secondary | ICD-10-CM | POA: Diagnosis not present

## 2023-04-16 NOTE — Progress Notes (Unsigned)
Location:  Other [place].  Nursing Home Room Number: [facility] 202A Place of Service:  SNF [phone]) Provider:  Earnestine Mealing, MD  Patient Care Team: Earnestine Mealing, MD as PCP - General [facility])  Extended Emergency Contact Information Primary Emergency Contact: Dibens,Marcia  [place] Home Phone: [phone] Relation: Daughter  Code Status:  DNR Goals of care: Advanced Directive information    04/16/2023    9:11 AM  Advanced Directives  Does Patient Have a Medical Advance Directive? Yes  Type of Advance Directive Out of facility DNR (pink MOST or yellow form)  Does patient want to make changes to medical advance directive? No - Patient declined     Chief Complaint  Patient presents with   Medical Management of Chronic Issues    Medical Management of Chronic Issues.     HPI:  Pt is a 89 y.o. female seen today for medical management of chronic diseases.    Hx of dementia. Increase in hallucinations at times. Does not endorse them at the moment, however, nursing and caregivers endorse periodic hallucinations.   Weight loss noted. Sleeping more throughout the day. Requiring more support. Not walking as much as previously.   Past Medical History:  Diagnosis Date   Allergic rhinitis due to pollen    some shellfish also   Aortic sclerosis 5/12   On echo. no stenosis   Arthritis    Diastolic dysfunction    stress echo otherwise normal 1/04. Repeat 5/11 also negative   History of seasonal allergies    Hx of basal cell carcinoma 1990   multiple sites   Hx of squamous cell carcinoma of skin 2000   R nasolabial fold   Hyperlipidemia    Osteoarthritis, multiple sites    Osteoporosis    Overactive bladder    Urge incontinence    Vaginal odor 04/2018   aerobic vaginitis on One Swab culture   Past Surgical History:  Procedure Laterality Date   CYSTOCELE REPAIR  2007   and rectocele   DILATION AND CURETTAGE OF UTERUS     INGUINAL HERNIA  REPAIR  2012   left side with mesh   REVERSE SHOULDER ARTHROPLASTY Right 04/27/2017   Procedure: REVERSE SHOULDER ARTHROPLASTY;  Surgeon: Christena Flake, MD;  Location: [facility] ORS;  Service: Orthopedics;  Laterality: Right;   TONSILLECTOMY AND ADENOIDECTOMY     as child   TOTAL HIP ARTHROPLASTY Right 05/23/2015   Procedure: TOTAL HIP ARTHROPLASTY ANTERIOR APPROACH;  Surgeon: Kennedy Bucker, MD;  Location: [facility] ORS;  Service: Orthopedics;  Laterality: Right;   UMBILICAL HERNIA REPAIR  02/04/06    Allergies  Allergen Reactions   Cat Hair Extract    Demerol [Meperidine]     dizzy   Dust Mite Extract    Shellfish Allergy    Avelox [Moxifloxacin Hcl In Nacl] Rash    Outpatient Encounter Medications as of 04/16/2023  Medication Sig   acetaminophen (TYLENOL) 500 MG tablet Take 500 mg by mouth 3 (three) times daily. Give one tablet by mouth every 8 hours as needed for pain.   aspirin EC 81 MG tablet Take 81 mg by mouth daily. Swallow whole.   brimonidine (ALPHAGAN) 0.2 % ophthalmic solution Place 1 drop into both eyes 2 (two) times daily.   chlorhexidine (PERIDEX) 0.12 % solution Use as directed 10 mLs in the mouth or throat 2 (two) times daily.   diclofenac Sodium (VOLTAREN) 1 % GEL Apply 4 g topically 3 (three) times daily. To  left knee see other listing for shoulder use   diclofenac Sodium (VOLTAREN) 1 % GEL Apply 2 g topically every 8 (eight) hours as needed (to right shoulder).   dorzolamide-timolol (COSOPT) 22.3-6.8 MG/ML ophthalmic solution Apply 1 drop to eye 2 (two) times daily.   EPIPEN 2-PAK 0.3 MG/0.3ML SOAJ injection Inject into the muscle as needed for anaphylaxis (or allergic reaction).   fluticasone (FLONASE) 50 MCG/ACT nasal spray Place 1 spray into both nostrils 2 (two) times a day.   latanoprost (XALATAN) 0.005 % ophthalmic solution Place 1 drop into both eyes at bedtime.   levocetirizine (XYZAL) 5 MG tablet Take 5 mg by mouth every evening.   melatonin 3 MG TABS tablet Take 3  mg by mouth at bedtime.   mirtazapine (REMERON) 7.5 MG tablet Take 7.5 mg by mouth at bedtime.   Multiple Vitamin (MULTIVITAMIN) tablet Take 1 tablet by mouth daily.   Nutritional Supplements (ENSURE CLEAR) LIQD Take 1 Container by mouth with breakfast, with lunch, and with evening meal.   polyethylene glycol (MIRALAX / GLYCOLAX) 17 g packet Take 17 g by mouth daily.   Probiotic Product (PROBIOTIC ADVANCED PO) Take 1 capsule by mouth daily.   Simethicone (GAS-X PO) Take by mouth 2 (two) times daily.   traMADol (ULTRAM) 50 MG tablet Take 1 tablet (50 mg total) by mouth at bedtime. Give one tablet by mouth every 8 hours as needed.   No facility-administered encounter medications on file as of 04/16/2023.    Review of Systems  Immunization History  Administered Date(s) Administered   Covid-19, Mrna,Vaccine(Spikevax)15yrs and older 11/10/2022   Influenza, High Dose Seasonal PF 04/28/2017, 05/16/2018, 05/19/2022   Influenza, Seasonal, Injecte, Preservative Fre 05/28/2016   Influenza,inj,Quad PF,6+ Mos 05/25/2013, 05/07/2015   Influenza-Unspecified 04/24/2014, 05/20/2021   Moderna SARS-COV2 Booster Vaccination 06/14/2020, 12/20/2020, 12/30/2021   Moderna Sars-Covid-2 Vaccination 08/14/2019, 09/11/2019, 06/14/2020, 04/25/2021, 06/12/2022   Pfizer Covid-19 Vaccine Bivalent Booster 2yrs & up 04/25/2021   Pneumococcal Conjugate-13 02/07/2014   Pneumococcal Polysaccharide-23 02/15/2012, 03/07/2018   Td 09/12/2002, 02/06/2013   Zoster, Live 02/19/2016   Pertinent  Health Maintenance Due  Topic Date Due   INFLUENZA VACCINE  03/04/2023   DEXA SCAN  Completed      07/07/2021    8:00 PM 07/08/2021    8:09 AM 08/13/2021    6:31 AM 12/31/2022    2:58 PM 02/05/2023    1:15 PM  Fall Risk  Falls in the past year?    1 1  Was there an injury with Fall?     0  Fall Risk Category Calculator     2  (RETIRED) Patient Fall Risk Level High fall risk High fall risk Moderate fall risk    Patient at Risk  for Falls Due to    History of fall(s);Impaired balance/gait History of fall(s);Impaired balance/gait   Functional Status Survey:    Vitals:   04/16/23 0905  BP: 130/68  Pulse: 80  Resp: 18  Temp: (!) 97.3 F (36.3 C)  SpO2: 95%  Weight: 139 lb 1.6 oz (63.1 kg)  Height: 5\' 6"  (1.676 m)   Body mass index is 22.45 kg/m. Physical Exam Cardiovascular:     Rate and Rhythm: Normal rate.     Pulses: Normal pulses.  Pulmonary:     Effort: Pulmonary effort is normal.  Abdominal:     General: Abdomen is flat.  Neurological:     Mental Status: She is alert. Mental status is at baseline.  CommentsShona Simpson 2024     Labs reviewed: Recent Labs    08/17/22 0000  NA 137  K 4.6  CL 104  CO2 28*  BUN 18  CREATININE 1.0  CALCIUM 9.0   Recent Labs    08/17/22 0000  AST 18  ALT 10  ALKPHOS 90  ALBUMIN 3.6   Recent Labs    08/17/22 0000 10/29/22 0000 12/31/22 0000  WBC 8.3 10.8 8.8  NEUTROABS 2,747.00 3,359.00 3,881.00  HGB 12.4 11.0* 12.6  HCT 38 34* 37  PLT 253 279 280   Lab Results  Component Value Date   TSH 1.27 02/06/2013   No results found for: "HGBA1C" Lab Results  Component Value Date   CHOL 232 (H) 02/06/2013   HDL 77.50 02/06/2013   LDLDIRECT 138.4 02/06/2013   TRIG 72.0 02/06/2013   CHOLHDL 3 02/06/2013    Significant Diagnostic Results in last 30 days:  No results found.  Assessment/Plan Vascular dementia with behavior disturbance (HCC)  Malnutrition of mild degree (HCC)  Chronic diastolic heart failure (HCC)  Chronic constipation  Gastroesophageal reflux disease without esophagitis Patient with history of dementia with progression of disease. FAST stage 6E at this time with increasing incontinence. Weight loss and poor PO intake despite starting mirtazapine and protein supplementation with ensure clears. Continue supportive care. Appears euvolemic at this time. Constipation well-controlled with miralax. GERD controlled without  treatment at this time. Continue GOC conversations as patient is becoming increased risk for infection and decline.   Family/ staff Communication: nursing, will contact [facility] at a later date.   Labs/tests ordered:  none

## 2023-05-06 LAB — HEPATIC FUNCTION PANEL
ALT: 15 U/L (ref 7–35)
AST: 20 (ref 13–35)
Alkaline Phosphatase: 95 (ref 25–125)
Bilirubin, Direct: 0.1
Bilirubin, Total: 0.4

## 2023-05-06 LAB — COMPREHENSIVE METABOLIC PANEL
Albumin: 4.2 (ref 3.5–5.0)
Globulin: 2.9

## 2023-06-18 ENCOUNTER — Telehealth: Payer: Medicare Other | Admitting: Internal Medicine

## 2023-06-18 NOTE — Telephone Encounter (Signed)
Called for Urine Culture Positive Patient having dysuria per nurse Ecoli 100 k Will start on Keflex 500 mg TID for 7 days Can use Azo Prn for 3 days

## 2023-06-21 ENCOUNTER — Encounter: Payer: Self-pay | Admitting: Student

## 2023-06-21 ENCOUNTER — Non-Acute Institutional Stay (SKILLED_NURSING_FACILITY): Payer: Self-pay | Admitting: Student

## 2023-06-21 DIAGNOSIS — R300 Dysuria: Secondary | ICD-10-CM

## 2023-06-21 DIAGNOSIS — K5909 Other constipation: Secondary | ICD-10-CM | POA: Diagnosis not present

## 2023-06-21 DIAGNOSIS — N3281 Overactive bladder: Secondary | ICD-10-CM

## 2023-06-21 DIAGNOSIS — I5032 Chronic diastolic (congestive) heart failure: Secondary | ICD-10-CM

## 2023-06-21 DIAGNOSIS — E441 Mild protein-calorie malnutrition: Secondary | ICD-10-CM

## 2023-06-21 DIAGNOSIS — F01518 Vascular dementia, unspecified severity, with other behavioral disturbance: Secondary | ICD-10-CM

## 2023-06-21 NOTE — Progress Notes (Signed)
Location:  Other Nursing Home Room Number: 201 A Place of Service:  SNF (31) Provider:  Judeth Horn, MD  Patient Care Team: Earnestine Mealing, MD as PCP - General (Family Medicine)  Extended Emergency Contact Information Primary Emergency Contact: Dibens,Marcia  [place] Home Phone: [phone] Relation: Daughter  Code Status:  DNR Goals of care: Advanced Directive information    04/16/2023    9:11 AM  Advanced Directives  Does Patient Have a Medical Advance Directive? Yes  Type of Advance Directive Out of facility DNR (pink MOST or yellow form)  Does patient want to make changes to medical advance directive? No - Patient declined     Chief Complaint  Patient presents with   Medical Management of Chronic Conditions    HPI:  Pt is a 89 y.o. female seen today for medical management of chronic diseases.    Acute concerns for continued decline and dysuria.  She is sleeping more throughout the day less interactive.  Increased confusion for the last few months as well.   Past Medical History:  Diagnosis Date   Allergic rhinitis due to pollen    some shellfish also   Aortic sclerosis 5/12   On echo. no stenosis   Arthritis    Diastolic dysfunction    stress echo otherwise normal 1/04. Repeat 5/11 also negative   History of seasonal allergies    Hx of basal cell carcinoma 1990   multiple sites   Hx of squamous cell carcinoma of skin 2000   R nasolabial fold   Hyperlipidemia    Osteoarthritis, multiple sites    Osteoporosis    Overactive bladder    Urge incontinence    Vaginal odor 04/2018   aerobic vaginitis on One Swab culture   Past Surgical History:  Procedure Laterality Date   CYSTOCELE REPAIR  2007   and rectocele   DILATION AND CURETTAGE OF UTERUS     INGUINAL HERNIA REPAIR  2012   left side with mesh   REVERSE SHOULDER ARTHROPLASTY Right 04/27/2017   Procedure: REVERSE SHOULDER ARTHROPLASTY;  Surgeon: Christena Flake, MD;   Location: [facility]S;  Service: Orthopedics;  Laterality: Right;   TONSILLECTOMY AND ADENOIDECTOMY     as child   TOTAL HIP ARTHROPLASTY Right 05/23/2015   Procedure: TOTAL HIP ARTHROPLASTY ANTERIOR APPROACH;  Surgeon: Kennedy Bucker, MD;  Location: [facility] ORS;  Service: Orthopedics;  Laterality: Right;   UMBILICAL HERNIA REPAIR  02/04/06    Allergies  Allergen Reactions   Cat Hair Extract    Demerol [Meperidine]     dizzy   Dust Mite Extract    Shellfish Allergy    Avelox [Moxifloxacin Hcl In Nacl] Rash    Outpatient Encounter Medications as of 06/21/2023  Medication Sig   acetaminophen (TYLENOL) 500 MG tablet Take 500 mg by mouth 3 (three) times daily. Give one tablet by mouth every 8 hours as needed for pain.   aspirin EC 81 MG tablet Take 81 mg by mouth daily. Swallow whole.   brimonidine (ALPHAGAN) 0.2 % ophthalmic solution Place 1 drop into both eyes 2 (two) times daily.   chlorhexidine (PERIDEX) 0.12 % solution Use as directed 10 mLs in the mouth or throat 2 (two) times daily.   diclofenac Sodium (VOLTAREN) 1 % GEL Apply 4 g topically 3 (three) times daily. To left knee see other listing for shoulder use   diclofenac Sodium (VOLTAREN) 1 % GEL Apply 2 g topically every 8 (eight) hours as  needed (to right shoulder).   dorzolamide-timolol (COSOPT) 22.3-6.8 MG/ML ophthalmic solution Apply 1 drop to eye 2 (two) times daily.   EPIPEN 2-PAK 0.3 MG/0.3ML SOAJ injection Inject into the muscle as needed for anaphylaxis (or allergic reaction).   fluticasone (FLONASE) 50 MCG/ACT nasal spray Place 1 spray into both nostrils 2 (two) times a day.   latanoprost (XALATAN) 0.005 % ophthalmic solution Place 1 drop into both eyes at bedtime.   levocetirizine (XYZAL) 5 MG tablet Take 5 mg by mouth every evening.   melatonin 3 MG TABS tablet Take 3 mg by mouth at bedtime.   mirtazapine (REMERON) 7.5 MG tablet Take 7.5 mg by mouth at bedtime.   Multiple Vitamin (MULTIVITAMIN) tablet Take 1 tablet by mouth  daily.   Nutritional Supplements (ENSURE CLEAR) LIQD Take 1 Container by mouth with breakfast, with lunch, and with evening meal.   polyethylene glycol (MIRALAX / GLYCOLAX) 17 g packet Take 17 g by mouth daily.   Probiotic Product (PROBIOTIC ADVANCED PO) Take 1 capsule by mouth daily.   Simethicone (GAS-X PO) Take by mouth 2 (two) times daily.   traMADol (ULTRAM) 50 MG tablet Take 1 tablet (50 mg total) by mouth at bedtime. Give one tablet by mouth every 8 hours as needed.   No facility-administered encounter medications on file as of 06/21/2023.    Review of Systems  Immunization History  Administered Date(s) Administered   Influenza, High Dose Seasonal PF 04/28/2017, 05/16/2018, 05/19/2022   Influenza, Seasonal, Injecte, Preservative Fre 05/28/2016   Influenza,inj,Quad PF,6+ Mos 05/25/2013, 05/07/2015   Influenza-Unspecified 04/24/2014, 05/20/2021   Moderna Covid-19 Fall Seasonal Vaccine 26yrs & older 11/10/2022   Moderna SARS-COV2 Booster Vaccination 06/14/2020, 12/20/2020, 12/30/2021   Moderna Sars-Covid-2 Vaccination 08/14/2019, 09/11/2019, 06/14/2020, 04/25/2021, 06/12/2022   Pfizer Covid-19 Vaccine Bivalent Booster 58yrs & up 04/25/2021   Pneumococcal Conjugate-13 02/07/2014   Pneumococcal Polysaccharide-23 02/15/2012, 03/07/2018   Td 09/12/2002, 02/06/2013   Zoster, Live 02/19/2016   Pertinent  Health Maintenance Due  Topic Date Due   INFLUENZA VACCINE  03/04/2023   DEXA SCAN  Completed      07/07/2021    8:00 PM 07/08/2021    8:09 AM 08/13/2021    6:31 AM 12/31/2022    2:58 PM 02/05/2023    1:15 PM  Fall Risk  Falls in the past year?    1 1  Was there an injury with Fall?     0  Fall Risk Category Calculator     2  (RETIRED) Patient Fall Risk Level High fall risk High fall risk Moderate fall risk    Patient at Risk for Falls Due to    History of fall(s);Impaired balance/gait History of fall(s);Impaired balance/gait   Functional Status Survey:    Vitals:   06/21/23  0959  BP: (!) 147/78  Pulse: 66  Resp: 16  Temp: (!) 97.4 F (36.3 C)  SpO2: 95%  Weight: 139 lb 3.2 oz (63.1 kg)  Height: 5\' 5"  (1.651 m)   Body mass index is 23.16 kg/m. Physical Exam Constitutional:      Comments: Alert, thin, frail.  Cardiovascular:     Rate and Rhythm: Normal rate.     Pulses: Normal pulses.  Pulmonary:     Effort: Pulmonary effort is normal.  Abdominal:     General: Abdomen is flat.     Palpations: Abdomen is soft.  Skin:    General: Skin is warm and dry.  Neurological:     Comments: Oriented to self  and place.     Labs reviewed: Recent Labs    08/17/22 0000  NA 137  K 4.6  CL 104  CO2 28*  BUN 18  CREATININE 1.0  CALCIUM 9.0   Recent Labs    08/17/22 0000  AST 18  ALT 10  ALKPHOS 90  ALBUMIN 3.6   Recent Labs    08/17/22 0000 10/29/22 0000 12/31/22 0000  WBC 8.3 10.8 8.8  NEUTROABS 2,747.00 3,359.00 3,881.00  HGB 12.4 11.0* 12.6  HCT 38 34* 37  PLT 253 279 280   Lab Results  Component Value Date   TSH 1.27 02/06/2013   No results found for: "HGBA1C" Lab Results  Component Value Date   CHOL 232 (H) 02/06/2013   HDL 77.50 02/06/2013   LDLDIRECT 138.4 02/06/2013   TRIG 72.0 02/06/2013   CHOLHDL 3 02/06/2013    Significant Diagnostic Results in last 30 days:  No results found.  Assessment/Plan Vascular dementia with behavior disturbance (HCC)  Malnutrition of mild degree (HCC)  Chronic diastolic heart failure (HCC)  Chronic constipation  Overactive bladder  Dysuria Patient with a history of dementia which continues to progress.  Periodic hallucinations, however none endorsed today.  Patient is compliant with assistance needed throughout the day.  She is continent of urine, however she is endorsing dysuria at this time.  No abdominal tenderness.  Will collect urinalysis and culture to evaluate for UTI given patient's history of recurrent UTIs.  Treat accordingly to culture.  Encourage protein supplementation  as tolerated.  Continue mirtazapine for appetite stimulation.  No signs of bleeding with aspirin continue at this time.  History of chronic pain continue Ultram every 8 hours as needed.  Continue goals of care conversations with family members.  Patient appears euvolemic on exam  Family/ staff Communication: nursing  Labs/tests ordered:  UA/CS

## 2023-06-24 ENCOUNTER — Other Ambulatory Visit: Payer: Self-pay | Admitting: Nurse Practitioner

## 2023-06-24 DIAGNOSIS — M7581 Other shoulder lesions, right shoulder: Secondary | ICD-10-CM

## 2023-06-24 MED ORDER — TRAMADOL HCL 50 MG PO TABS
50.0000 mg | ORAL_TABLET | Freq: Every day | ORAL | 0 refills | Status: DC
Start: 2023-06-24 — End: 2023-10-18

## 2023-06-27 ENCOUNTER — Encounter: Payer: Self-pay | Admitting: Student

## 2023-07-19 ENCOUNTER — Non-Acute Institutional Stay (SKILLED_NURSING_FACILITY): Payer: Self-pay | Admitting: Student

## 2023-07-19 DIAGNOSIS — S81812A Laceration without foreign body, left lower leg, initial encounter: Secondary | ICD-10-CM | POA: Diagnosis not present

## 2023-07-19 NOTE — Progress Notes (Signed)
Location:  Other Nursing Home Room Number: 201 A Place of Service:  SNF (31) Provider:  Judeth Horn, MD  Patient Care Team: Earnestine Mealing, MD as PCP - General (Family Medicine)  Extended Emergency Contact Information Primary Emergency Contact: Dibens,Marcia  [place] Home Phone: [phone] Relation: Daughter  Code Status:  DNR Goals of care: Advanced Directive information    04/16/2023    9:11 AM  Advanced Directives  Does Patient Have a Medical Advance Directive? Yes  Type of Advance Directive Out of facility DNR (pink MOST or yellow form)  Does patient want to make changes to medical advance directive? No - Patient declined     Chief Complaint  Patient presents with   Wound Care    HPI:  Pt is a 89 y.o. female seen today for an acute visit for f/u of skin tear. Patient with left shin skin tear after rupture of blister. Skin flap peeling away. "It hurts."   Past Medical History:  Diagnosis Date   Allergic rhinitis due to pollen    some shellfish also   Aortic sclerosis 5/12   On echo. no stenosis   Arthritis    Diastolic dysfunction    stress echo otherwise normal 1/04. Repeat 5/11 also negative   History of seasonal allergies    Hx of basal cell carcinoma 1990   multiple sites   Hx of squamous cell carcinoma of skin 2000   R nasolabial fold   Hyperlipidemia    Osteoarthritis, multiple sites    Osteoporosis    Overactive bladder    Urge incontinence    Vaginal odor 04/2018   aerobic vaginitis on One Swab culture   Past Surgical History:  Procedure Laterality Date   CYSTOCELE REPAIR  2007   and rectocele   DILATION AND CURETTAGE OF UTERUS     INGUINAL HERNIA REPAIR  2012   left side with mesh   REVERSE SHOULDER ARTHROPLASTY Right 04/27/2017   Procedure: REVERSE SHOULDER ARTHROPLASTY;  Surgeon: Christena Flake, MD;  Location: [facility] ORS;  Service: Orthopedics;  Laterality: Right;   TONSILLECTOMY AND ADENOIDECTOMY     as  child   TOTAL HIP ARTHROPLASTY Right 05/23/2015   Procedure: TOTAL HIP ARTHROPLASTY ANTERIOR APPROACH;  Surgeon: Kennedy Bucker, MD;  Location: [facility] OR[facility];  Service: Orthopedics;  Laterality: Right;   UMBILICAL HERNIA REPAIR  02/04/06    Allergies  Allergen Reactions   Cat Hair Extract    Demerol [Meperidine]     dizzy   Dust Mite Extract    Shellfish Allergy    Avelox [Moxifloxacin Hcl In Nacl] Rash    Outpatient Encounter Medications as of 07/19/2023  Medication Sig   acetaminophen (TYLENOL) 500 MG tablet Take 500 mg by mouth 3 (three) times daily. Give one tablet by mouth every 8 hours as needed for pain.   aspirin EC 81 MG tablet Take 81 mg by mouth daily. Swallow whole.   brimonidine (ALPHAGAN) 0.2 % ophthalmic solution Place 1 drop into both eyes 2 (two) times daily.   chlorhexidine (PERIDEX) 0.12 % solution Use as directed 10 mLs in the mouth or throat 2 (two) times daily.   diclofenac Sodium (VOLTAREN) 1 % GEL Apply 4 g topically 3 (three) times daily. To left knee see other listing for shoulder use   diclofenac Sodium (VOLTAREN) 1 % GEL Apply 2 g topically every 8 (eight) hours as needed (to right shoulder).   dorzolamide-timolol (COSOPT) 22.3-6.8 MG/ML ophthalmic solution Apply 1  drop to eye 2 (two) times daily.   EPIPEN 2-PAK 0.3 MG/0.3ML SOAJ injection Inject into the muscle as needed for anaphylaxis (or allergic reaction).   fluticasone (FLONASE) 50 MCG/ACT nasal spray Place 1 spray into both nostrils 2 (two) times a day.   latanoprost (XALATAN) 0.005 % ophthalmic solution Place 1 drop into both eyes at bedtime.   levocetirizine (XYZAL) 5 MG tablet Take 5 mg by mouth every evening.   melatonin 3 MG TABS tablet Take 3 mg by mouth at bedtime.   mirtazapine (REMERON) 7.5 MG tablet Take 7.5 mg by mouth at bedtime.   Multiple Vitamin (MULTIVITAMIN) tablet Take 1 tablet by mouth daily.   Nutritional Supplements (ENSURE CLEAR) LIQD Take 1 Container by mouth with breakfast, with  lunch, and with evening meal.   polyethylene glycol (MIRALAX / GLYCOLAX) 17 g packet Take 17 g by mouth daily.   Probiotic Product (PROBIOTIC ADVANCED PO) Take 1 capsule by mouth daily.   Simethicone (GAS-X PO) Take by mouth 2 (two) times daily.   traMADol (ULTRAM) 50 MG tablet Take 1 tablet (50 mg total) by mouth at bedtime. Give one tablet by mouth every 8 hours as needed.   No facility-administered encounter medications on file as of 07/19/2023.    Review of Systems  Immunization History  Administered Date(s) Administered   Influenza, High Dose Seasonal PF 04/28/2017, 05/16/2018, 05/19/2022   Influenza, Seasonal, Injecte, Preservative Fre 05/28/2016   Influenza,inj,Quad PF,6+ Mos 05/25/2013, 05/07/2015   Influenza-Unspecified 04/24/2014, 05/20/2021   Moderna Covid-19 Fall Seasonal Vaccine 10yrs & older 11/10/2022   Moderna SARS-COV2 Booster Vaccination 06/14/2020, 12/20/2020, 12/30/2021   Moderna Sars-Covid-2 Vaccination 08/14/2019, 09/11/2019, 06/14/2020, 04/25/2021, 06/12/2022   Pfizer Covid-19 Vaccine Bivalent Booster 34yrs & up 04/25/2021   Pneumococcal Conjugate-13 02/07/2014   Pneumococcal Polysaccharide-23 02/15/2012, 03/07/2018   Td 09/12/2002, 02/06/2013   Zoster, Live 02/19/2016   Pertinent  Health Maintenance Due  Topic Date Due   INFLUENZA VACCINE  03/04/2023   DEXA SCAN  Completed      07/07/2021    8:00 PM 07/08/2021    8:09 AM 08/13/2021    6:31 AM 12/31/2022    2:58 PM 02/05/2023    1:15 PM  Fall Risk  Falls in the past year?    1 1  Was there an injury with Fall?     0  Fall Risk Category Calculator     2  (RETIRED) Patient Fall Risk Level High fall risk High fall risk Moderate fall risk    Patient at Risk for Falls Due to    History of fall(s);Impaired balance/gait History of fall(s);Impaired balance/gait   Functional Status Survey:    Vitals:   07/20/23 2114  Weight: 143 lb 3.2 oz (65 kg)   Body mass index is 23.83 kg/m. Physical Exam Skin:     Comments: Left leg with 3.5X2.5cm ulceration with fibrinous tisuse present. No surrounding erythema, induration     Labs reviewed: Recent Labs    08/17/22 0000  NA 137  K 4.6  CL 104  CO2 28*  BUN 18  CREATININE 1.0  CALCIUM 9.0   Recent Labs    08/17/22 0000  AST 18  ALT 10  ALKPHOS 90  ALBUMIN 3.6   Recent Labs    08/17/22 0000 10/29/22 0000 12/31/22 0000  WBC 8.3 10.8 8.8  NEUTROABS 2,747.00 3,359.00 3,881.00  HGB 12.4 11.0* 12.6  HCT 38 34* 37  PLT 253 279 280   Lab Results  Component Value  Date   TSH 1.27 02/06/2013   No results found for: "HGBA1C" Lab Results  Component Value Date   CHOL 232 (H) 02/06/2013   HDL 77.50 02/06/2013   LDLDIRECT 138.4 02/06/2013   TRIG 72.0 02/06/2013   CHOLHDL 3 02/06/2013    Significant Diagnostic Results in last 30 days:  No results found.  Assessment/Plan Noninfected skin tear of left lower extremity, initial encounter Left leg wound with small amount of slough present. Clean with normal saline. Apply calcium alginate and proximel dressing. Change daily.   Family/ staff Communication: nursing  Labs/tests ordered:  none

## 2023-07-20 ENCOUNTER — Encounter: Payer: Self-pay | Admitting: Student

## 2023-08-12 ENCOUNTER — Non-Acute Institutional Stay (SKILLED_NURSING_FACILITY): Payer: Medicare Other | Admitting: Nurse Practitioner

## 2023-08-12 ENCOUNTER — Encounter: Payer: Self-pay | Admitting: Nurse Practitioner

## 2023-08-12 DIAGNOSIS — T148XXA Other injury of unspecified body region, initial encounter: Secondary | ICD-10-CM | POA: Diagnosis not present

## 2023-08-12 DIAGNOSIS — F03B0 Unspecified dementia, moderate, without behavioral disturbance, psychotic disturbance, mood disturbance, and anxiety: Secondary | ICD-10-CM | POA: Diagnosis not present

## 2023-08-12 DIAGNOSIS — L089 Local infection of the skin and subcutaneous tissue, unspecified: Secondary | ICD-10-CM

## 2023-08-12 DIAGNOSIS — J301 Allergic rhinitis due to pollen: Secondary | ICD-10-CM

## 2023-08-12 DIAGNOSIS — K5909 Other constipation: Secondary | ICD-10-CM | POA: Diagnosis not present

## 2023-08-12 DIAGNOSIS — E441 Mild protein-calorie malnutrition: Secondary | ICD-10-CM

## 2023-08-12 NOTE — Progress Notes (Signed)
 Location:  Other [facility].  Nursing Home Room Number: [facility] 202A Place of Service:  SNF (31) Harlene An, NP  PCP: Abdul Fine, MD  Patient Care Team: Abdul Fine, MD as PCP - General [facility])  Extended Emergency Contact Information Primary Emergency Contact: Dibens,Marcia  [place] Home Phone: [phone] Relation: Daughter  Goals of care: Advanced Directive information    08/12/2023    9:52 AM  Advanced Directives  Does Patient Have a Medical Advance Directive? Yes  Type of Advance Directive Out of facility DNR (pink MOST or yellow form)  Does patient want to make changes to medical advance directive? No - Patient declined     Chief Complaint  Patient presents with   Medical Management of Chronic Issues    Medical Management of Chronic Issues.     HPI:  Pt is a 89 y.o. female seen today for medical management of chronic disease.  Pt with hx of OA, dementia, constipation, allergies She was recently seen by wound care due to nonhealing skin tear with infection.  Currently dressed and staff reports wound is now healing well.  She is currently being treated with doxycycline  She typically will eat a good breakfast and super and on supplement Sometimes will sleep through lunch but will eat once she wakes up Weight have been stable.    Past Medical History:  Diagnosis Date   Allergic rhinitis due to pollen    some shellfish also   Aortic sclerosis 5/12   On echo. no stenosis   Arthritis    Diastolic dysfunction    stress echo otherwise normal 1/04. Repeat 5/11 also negative   History of seasonal allergies    Hx of basal cell carcinoma 1990   multiple sites   Hx of squamous cell carcinoma of skin 2000   R nasolabial fold   Hyperlipidemia    Osteoarthritis, multiple sites    Osteoporosis    Overactive bladder    Urge incontinence    Vaginal odor 04/2018   aerobic vaginitis on One Swab culture   Past Surgical  History:  Procedure Laterality Date   CYSTOCELE REPAIR  2007   and rectocele   DILATION AND CURETTAGE OF UTERUS     INGUINAL HERNIA REPAIR  2012   left side with mesh   REVERSE SHOULDER ARTHROPLASTY Right 04/27/2017   Procedure: REVERSE SHOULDER ARTHROPLASTY;  Surgeon: Edie Norleen PARAS, MD;  Location: [facility] ORS;  Service: Orthopedics;  Laterality: Right;   TONSILLECTOMY AND ADENOIDECTOMY     as child   TOTAL HIP ARTHROPLASTY Right 05/23/2015   Procedure: TOTAL HIP ARTHROPLASTY ANTERIOR APPROACH;  Surgeon: Ozell Flake, MD;  Location: [facility] ORS;  Service: Orthopedics;  Laterality: Right;   UMBILICAL HERNIA REPAIR  02/04/06    Allergies  Allergen Reactions   Cat Hair Extract    Demerol [Meperidine]     dizzy   Dust Mite Extract    Shellfish Allergy    Avelox  [Moxifloxacin  Hcl In Nacl] Rash    Outpatient Encounter Medications as of 08/12/2023  Medication Sig   acetaminophen  (TYLENOL ) 500 MG tablet Take 500 mg by mouth 3 (three) times daily. Give one tablet by mouth every 8 hours as needed for pain.   aspirin  EC 81 MG tablet Take 81 mg by mouth daily. Swallow whole.   brimonidine (ALPHAGAN) 0.2 % ophthalmic solution Place 1 drop into both eyes 2 (two) times daily.   diclofenac Sodium (VOLTAREN) 1 % GEL Apply 4 g  topically 3 (three) times daily. To left knee see other listing for shoulder use   diclofenac Sodium (VOLTAREN) 1 % GEL Apply 2 g topically every 8 (eight) hours as needed (to right shoulder).   dorzolamide -timolol  (COSOPT ) 22.3-6.8 MG/ML ophthalmic solution Apply 1 drop to eye 2 (two) times daily.   doxycycline (ADOXA) 100 MG tablet Take 100 mg by mouth 2 (two) times daily.   EPIPEN  2-PAK 0.3 MG/0.3ML SOAJ injection Inject into the muscle as needed for anaphylaxis (or allergic reaction).   fluticasone  (FLONASE ) 50 MCG/ACT nasal spray Place 1 spray into both nostrils 2 (two) times a day.   latanoprost  (XALATAN ) 0.005 % ophthalmic solution Place 1 drop into both eyes at bedtime.    levocetirizine (XYZAL ) 5 MG tablet Take 5 mg by mouth every evening.   melatonin 3 MG TABS tablet Take 3 mg by mouth at bedtime.   mirtazapine (REMERON) 7.5 MG tablet Take 7.5 mg by mouth at bedtime.   Multiple Vitamin (MULTIVITAMIN) tablet Take 1 tablet by mouth daily.   Nutritional Supplements (ENSURE CLEAR) LIQD Take 1 Container by mouth with breakfast, with lunch, and with evening meal.   polyethylene glycol (MIRALAX  / GLYCOLAX ) 17 g packet Take 17 g by mouth daily.   Probiotic Product (PROBIOTIC ADVANCED PO) Take 1 capsule by mouth daily.   silver sulfADIAZINE (SILVADENE) 1 % cream Apply 1 Application topically 2 (two) times daily.   Simethicone  (GAS-X PO) Take by mouth 2 (two) times daily.   traMADol  (ULTRAM ) 50 MG tablet Take 1 tablet (50 mg total) by mouth at bedtime. Give one tablet by mouth every 8 hours as needed.   chlorhexidine (PERIDEX) 0.12 % solution Use as directed 10 mLs in the mouth or throat 2 (two) times daily. (Patient not taking: Reported on 08/12/2023)   No facility-administered encounter medications on file as of 08/12/2023.    Review of Systems  Constitutional:  Negative for activity change, appetite change, fatigue and unexpected weight change.  HENT:  Negative for congestion and hearing loss.   Eyes: Negative.   Respiratory:  Negative for cough and shortness of breath.   Cardiovascular:  Negative for chest pain, palpitations and leg swelling.  Gastrointestinal:  Negative for abdominal pain, constipation and diarrhea.  Genitourinary:  Negative for difficulty urinating and dysuria.  Musculoskeletal:  Negative for arthralgias and myalgias.  Skin:  Negative for color change and wound.  Neurological:  Negative for dizziness and weakness.  Psychiatric/Behavioral:  Positive for confusion. Negative for agitation and behavioral problems.     Immunization History  Administered Date(s) Administered   Influenza, High Dose Seasonal PF 04/28/2017, 05/16/2018, 05/19/2022    Influenza, Seasonal, Injecte, Preservative Fre 05/28/2016   Influenza,inj,Quad PF,6+ Mos 05/25/2013, 05/07/2015   Influenza-Unspecified 04/24/2014, 05/20/2021, 05/26/2023   Moderna Covid-19 Fall Seasonal Vaccine 34yrs & older 11/10/2022   Moderna SARS-COV2 Booster Vaccination 06/14/2020, 12/20/2020, 12/30/2021   Moderna Sars-Covid-2 Vaccination 08/14/2019, 09/11/2019, 06/14/2020, 04/25/2021, 06/12/2022   Pfizer Covid-19 Vaccine Bivalent Booster 60yrs & up 04/25/2021   Pneumococcal Conjugate-13 02/07/2014   Pneumococcal Polysaccharide-23 02/15/2012, 03/07/2018   Td 09/12/2002, 02/06/2013   Unspecified SARS-COV-2 Vaccination 04/30/2023   Zoster, Live 02/19/2016   Pertinent  Health Maintenance Due  Topic Date Due   INFLUENZA VACCINE  Completed   DEXA SCAN  Completed      07/07/2021    8:00 PM 07/08/2021    8:09 AM 08/13/2021    6:31 AM 12/31/2022    2:58 PM 02/05/2023    1:15 PM  Fall Risk  Falls in the past year?    1 1  Was there an injury with Fall?     0  Fall Risk Category Calculator     2  (RETIRED) Patient Fall Risk Level High fall risk High fall risk Moderate fall risk    Patient at Risk for Falls Due to    History of fall(s);Impaired balance/gait History of fall(s);Impaired balance/gait   Functional Status Survey:    Vitals:   08/12/23 0932  BP: 134/78  Pulse: 72  Resp: 18  Temp: (!) 97.5 F (36.4 C)  SpO2: 94%  Weight: 142 lb (64.4 kg)  Height: 5' 6 (1.676 m)   Body mass index is 22.92 kg/m. Physical Exam Constitutional:      General: She is not in acute distress.    Appearance: She is well-developed. She is not diaphoretic.  HENT:     Head: Normocephalic and atraumatic.     Mouth/Throat:     Pharynx: No oropharyngeal exudate.  Eyes:     Conjunctiva/sclera: Conjunctivae normal.     Pupils: Pupils are equal, round, and reactive to light.  Cardiovascular:     Rate and Rhythm: Normal rate and regular rhythm.     Heart sounds: Normal heart sounds.   Pulmonary:     Effort: Pulmonary effort is normal.     Breath sounds: Normal breath sounds.  Abdominal:     General: Bowel sounds are normal.     Palpations: Abdomen is soft.  Musculoskeletal:     Cervical back: Normal range of motion and neck supple.     Right lower leg: No edema.     Left lower leg: No edema.  Skin:    General: Skin is warm and dry.  Neurological:     Mental Status: She is alert.  Psychiatric:        Mood and Affect: Mood normal.     Labs reviewed: Recent Labs    08/17/22 0000  NA 137  K 4.6  CL 104  CO2 28*  BUN 18  CREATININE 1.0  CALCIUM 9.0   Recent Labs    08/17/22 0000 05/06/23 0000  AST 18 20  ALT 10 15  ALKPHOS 90 95  ALBUMIN 3.6 4.2   Recent Labs    08/17/22 0000 10/29/22 0000 12/31/22 0000  WBC 8.3 10.8 8.8  NEUTROABS 2,747.00 3,359.00 3,881.00  HGB 12.4 11.0* 12.6  HCT 38 34* 37  PLT 253 279 280   Lab Results  Component Value Date   TSH 1.27 02/06/2013   No results found for: HGBA1C Lab Results  Component Value Date   CHOL 232 (H) 02/06/2013   HDL 77.50 02/06/2013   LDLDIRECT 138.4 02/06/2013   TRIG 72.0 02/06/2013   CHOLHDL 3 02/06/2013    Significant Diagnostic Results in last 30 days:  No results found.  Assessment/Plan 1. Chronic constipation (Primary) Well controlled on current regimen  2. Infected skin tear Started on doxycycline by wound care, nursing reports healing well. Continues with silver dsg twice daily   3. Moderate dementia without behavioral disturbance, psychotic disturbance, mood disturbance, or anxiety, unspecified dementia type (HCC) -Encouraged dietary compliance, routine foot care/monitoring and to keep up with diabetic eye exams through ophthalmology   4. Malnutrition of mild degree (HCC) -weight stable, continues to monitor  5. Seasonal allergic rhinitis due to pollen Controlled on current regimen     Najmo Pardue K. Caro BODILY [facility] [phone]

## 2023-08-16 LAB — CBC AND DIFFERENTIAL
HCT: 38 (ref 36–46)
Hemoglobin: 12.4 (ref 12.0–16.0)
Neutrophils Absolute: 2215
Platelets: 211 10*3/uL (ref 150–400)
WBC: 7.8

## 2023-08-16 LAB — BASIC METABOLIC PANEL
BUN: 23 — AB (ref 4–21)
CO2: 24 — AB (ref 13–22)
Chloride: 104 (ref 99–108)
Creatinine: 0.7 (ref 0.5–1.1)
Glucose: 88
Potassium: 4.5 meq/L (ref 3.5–5.1)
Sodium: 135 — AB (ref 137–147)

## 2023-08-16 LAB — COMPREHENSIVE METABOLIC PANEL
Albumin: 3.6 (ref 3.5–5.0)
Calcium: 9 (ref 8.7–10.7)
Globulin: 2.6
eGFR: 75

## 2023-08-16 LAB — HEPATIC FUNCTION PANEL
ALT: 7 U/L (ref 7–35)
AST: 16 (ref 13–35)
Alkaline Phosphatase: 94 (ref 25–125)
Bilirubin, Total: 0.5

## 2023-08-16 LAB — CBC: RBC: 3.85 — AB (ref 3.87–5.11)

## 2023-10-18 ENCOUNTER — Other Ambulatory Visit: Payer: Self-pay | Admitting: Student

## 2023-10-18 DIAGNOSIS — M7581 Other shoulder lesions, right shoulder: Secondary | ICD-10-CM

## 2023-10-18 MED ORDER — TRAMADOL HCL 50 MG PO TABS
50.0000 mg | ORAL_TABLET | Freq: Every day | ORAL | 0 refills | Status: DC
Start: 2023-10-18 — End: 2023-11-11

## 2023-10-22 ENCOUNTER — Non-Acute Institutional Stay (SKILLED_NURSING_FACILITY): Payer: Self-pay | Admitting: Student

## 2023-10-22 ENCOUNTER — Encounter: Payer: Self-pay | Admitting: Student

## 2023-10-22 DIAGNOSIS — F03B0 Unspecified dementia, moderate, without behavioral disturbance, psychotic disturbance, mood disturbance, and anxiety: Secondary | ICD-10-CM | POA: Diagnosis not present

## 2023-10-22 DIAGNOSIS — I872 Venous insufficiency (chronic) (peripheral): Secondary | ICD-10-CM

## 2023-10-22 DIAGNOSIS — I5032 Chronic diastolic (congestive) heart failure: Secondary | ICD-10-CM

## 2023-10-22 DIAGNOSIS — E441 Mild protein-calorie malnutrition: Secondary | ICD-10-CM

## 2023-10-22 DIAGNOSIS — K5909 Other constipation: Secondary | ICD-10-CM

## 2023-10-22 DIAGNOSIS — I700 Atherosclerosis of aorta: Secondary | ICD-10-CM

## 2023-10-22 NOTE — Progress Notes (Unsigned)
 Location:  Other [facility].  Nursing Home Room Number: [facility] 202A Place of Service:  SNF [phone]) Provider:  Earnestine Mealing, MD  Patient Care Team: Earnestine Mealing, MD as PCP - General [facility])  Extended Emergency Contact Information Primary Emergency Contact: Dibens,Marcia  [place] Home Phone: [phone] Relation: Daughter  Code Status:  DNR Goals of care: Advanced Directive information    10/22/2023    2:01 PM  Advanced Directives  Does Patient Have a Medical Advance Directive? Yes  Type of Advance Directive Out of facility DNR (pink MOST or yellow form)  Does patient want to make changes to medical advance directive? No - Patient declined     Chief Complaint  Patient presents with  . Medical Management of Chronic Issues    Medical Management of Chronic Issues.     HPI:  Pt is a 89 y.o. female seen today for medical management of chronic diseases.   Patient states she is fine without concerns at this time.   Past Medical History:  Diagnosis Date  . Allergic rhinitis due to pollen    some shellfish also  . Aortic sclerosis 5/12   On echo. no stenosis  . Arthritis   . Diastolic dysfunction    stress echo otherwise normal 1/04. Repeat 5/11 also negative  . History of seasonal allergies   . Hx of basal cell carcinoma 1990   multiple sites  . Hx of squamous cell carcinoma of skin 2000   R nasolabial fold  . Hyperlipidemia   . Osteoarthritis, multiple sites   . Osteoporosis   . Overactive bladder   . Urge incontinence   . Vaginal odor 04/2018   aerobic vaginitis on One Swab culture   Past Surgical History:  Procedure Laterality Date  . CYSTOCELE REPAIR  2007   and rectocele  . DILATION AND CURETTAGE OF UTERUS    . INGUINAL HERNIA REPAIR  2012   left side with mesh  . REVERSE SHOULDER ARTHROPLASTY Right 04/27/2017   Procedure: REVERSE SHOULDER ARTHROPLASTY;  Surgeon: Christena Flake, MD;  Location: [facility] ORS;  Service:  Orthopedics;  Laterality: Right;  . TONSILLECTOMY AND ADENOIDECTOMY     as child  . TOTAL HIP ARTHROPLASTY Right 05/23/2015   Procedure: TOTAL HIP ARTHROPLASTY ANTERIOR APPROACH;  Surgeon: Kennedy Bucker, MD;  Location: [facility] ORS;  Service: Orthopedics;  Laterality: Right;  . UMBILICAL HERNIA REPAIR  02/04/06    Allergies  Allergen Reactions  . Cat Dander   . Demerol [Meperidine]     dizzy  . Dust Mite Extract   . Shellfish Allergy   . Avelox [Moxifloxacin Hcl In Nacl] Rash    Outpatient Encounter Medications as of 10/22/2023  Medication Sig  . acetaminophen (TYLENOL) 500 MG tablet Take 500 mg by mouth 3 (three) times daily. Give one tablet by mouth every 8 hours as needed for pain.  Marland Kitchen aspirin EC 81 MG tablet Take 81 mg by mouth daily. Swallow whole.  . brimonidine (ALPHAGAN) 0.2 % ophthalmic solution Place 1 drop into both eyes 2 (two) times daily.  . diclofenac Sodium (VOLTAREN) 1 % GEL Apply 4 g topically 3 (three) times daily. To left knee see other listing for shoulder use  . diclofenac Sodium (VOLTAREN) 1 % GEL Apply 2 g topically every 8 (eight) hours as needed (to right shoulder).  . dorzolamide-timolol (COSOPT) 22.3-6.8 MG/ML ophthalmic solution Apply 1 drop to eye 2 (two) times daily.  Marland Kitchen EPIPEN 2-PAK 0.3 MG/0.3ML SOAJ injection  Inject into the muscle as needed for anaphylaxis (or allergic reaction).  . fluticasone (FLONASE) 50 MCG/ACT nasal spray Place 1 spray into both nostrils 2 (two) times a day.  . latanoprost (XALATAN) 0.005 % ophthalmic solution Place 1 drop into both eyes at bedtime.  Marland Kitchen levocetirizine (XYZAL) 5 MG tablet Take 5 mg by mouth every evening.  . melatonin 3 MG TABS tablet Take 3 mg by mouth at bedtime.  . mirtazapine (REMERON) 7.5 MG tablet Take 7.5 mg by mouth at bedtime. Every other day  . Multiple Vitamin (MULTIVITAMIN) tablet Take 1 tablet by mouth daily.  . Nutritional Supplements (ENSURE CLEAR) LIQD Take 1 Container by mouth with breakfast, with lunch,  and with evening meal.  . polyethylene glycol (MIRALAX / GLYCOLAX) 17 g packet Take 17 g by mouth daily.  . Probiotic Product (PROBIOTIC ADVANCED PO) Take 1 capsule by mouth daily.  . Simethicone (GAS-X PO) Take by mouth 2 (two) times daily.  . traMADol (ULTRAM) 50 MG tablet Take 1 tablet (50 mg total) by mouth at bedtime. Give one tablet by mouth every 8 hours as needed.  . chlorhexidine (PERIDEX) 0.12 % solution Use as directed 10 mLs in the mouth or throat 2 (two) times daily. (Patient not taking: Reported on 10/22/2023)  . doxycycline (ADOXA) 100 MG tablet Take 100 mg by mouth 2 (two) times daily. (Patient not taking: Reported on 10/22/2023)  . silver sulfADIAZINE (SILVADENE) 1 % cream Apply 1 Application topically 2 (two) times daily. (Patient not taking: Reported on 10/22/2023)   No facility-administered encounter medications on file as of 10/22/2023.    Review of Systems  Immunization History  Administered Date(s) Administered  . Influenza, High Dose Seasonal PF 04/28/2017, 05/16/2018, 05/19/2022  . Influenza, Seasonal, Injecte, Preservative Fre 05/28/2016  . Influenza,inj,Quad PF,6+ Mos 05/25/2013, 05/07/2015  . Influenza-Unspecified 04/24/2014, 05/20/2021, 05/26/2023  . Moderna Covid-19 Fall Seasonal Vaccine 22yrs & older 11/10/2022  . Moderna SARS-COV2 Booster Vaccination 06/14/2020, 12/20/2020, 12/30/2021  . Moderna Sars-Covid-2 Vaccination 08/14/2019, 09/11/2019, 06/14/2020, 04/25/2021, 06/12/2022  . [employer] 51yrs & up 04/25/2021  . Pneumococcal Conjugate-13 02/07/2014  . Pneumococcal Polysaccharide-23 02/15/2012, 03/07/2018  . Td 09/12/2002, 02/06/2013  . Unspecified SARS-COV-2 Vaccination 04/30/2023  . Zoster, Live 02/19/2016   Pertinent  Health Maintenance Due  Topic Date Due  . INFLUENZA VACCINE  Completed  . DEXA SCAN  Completed      07/07/2021    8:00 PM 07/08/2021    8:09 AM 08/13/2021    6:31 AM 12/31/2022    2:58 PM 02/05/2023     1:15 PM  Fall Risk  Falls in the past year?    1 1  Was there an injury with Fall?     0  Fall Risk Category Calculator     2  (RETIRED) Patient Fall Risk Level High fall risk High fall risk Moderate fall risk    Patient at Risk for Falls Due to    History of fall(s);Impaired balance/gait History of fall(s);Impaired balance/gait   Functional Status Survey:    Vitals:   10/22/23 1355  BP: 134/74  Pulse: 86  Resp: 20  Temp: 97.7 F (36.5 C)  SpO2: 94%  Weight: 141 lb 1.6 oz (64 kg)  Height: 5\' 6"  (1.676 m)   Body mass index is 22.77 kg/m. Physical Exam  Labs reviewed: Recent Labs    08/16/23 0000  NA 135*  K 4.5  CL 104  CO2 24*  BUN 23*  CREATININE 0.7  CALCIUM 9.0   Recent Labs    05/06/23 0000 08/16/23 0000  AST 20 16  ALT 15 7  ALKPHOS 95 94  ALBUMIN 4.2 3.6   Recent Labs    10/29/22 0000 12/31/22 0000 08/16/23 0000  WBC 10.8 8.8 7.8  NEUTROABS 3,359.00 3,881.00 2,215.00  HGB 11.0* 12.6 12.4  HCT 34* 37 38  PLT 279 280 211   Lab Results  Component Value Date   TSH 1.27 02/06/2013   No results found for: "HGBA1C" Lab Results  Component Value Date   CHOL 232 (H) 02/06/2013   HDL 77.50 02/06/2013   LDLDIRECT 138.4 02/06/2013   TRIG 72.0 02/06/2013   CHOLHDL 3 02/06/2013    Significant Diagnostic Results in last 30 days:  No results found.  Assessment/Plan There are no diagnoses linked to this encounter.   Family/ staff Communication: ***  Labs/tests ordered:  ***

## 2023-11-09 ENCOUNTER — Non-Acute Institutional Stay (SKILLED_NURSING_FACILITY): Payer: Self-pay | Admitting: Nurse Practitioner

## 2023-11-09 ENCOUNTER — Encounter: Payer: Self-pay | Admitting: Nurse Practitioner

## 2023-11-09 DIAGNOSIS — M545 Low back pain, unspecified: Secondary | ICD-10-CM

## 2023-11-09 DIAGNOSIS — W19XXXA Unspecified fall, initial encounter: Secondary | ICD-10-CM

## 2023-11-09 NOTE — Progress Notes (Signed)
 Location:  Other [place].  Nursing Home Room Number: [facility] 202A Place of Service:  SNF (31) Abbey Chatters, NP  PCP: Earnestine Mealing, MD  Patient Care Team: Earnestine Mealing, MD as PCP - General [facility] Medicine)  Extended Emergency Contact Information Primary Emergency Contact: Dibens,Marcia  [place] Home Phone: [phone] Relation: Daughter  Goals of care: Advanced Directive information    10/22/2023    2:01 PM  Advanced Directives  Does Patient Have a Medical Advance Directive? Yes  Type of Advance Directive Out of facility DNR (pink MOST or yellow form)  Does patient want to make changes to medical advance directive? No - Patient declined     Chief Complaint  Patient presents with   Fall   Back Pain    Fall and Back Pain    HPI:  Pt is a 89 y.o. female seen today for an acute visit for Fall and Back Pain.  The patient presents with lower back pain following a fall.  She fell this morning while attempting to get dressed without assistance. She was pushing the bedside table, which moved unexpectedly, causing her to fall. She was wearing socks, which may have contributed to the fall.  She has significant pain in the lower back, described as bilateral, without radiation to the legs. The pain persists with movement and is located primarily in the lower back, slightly higher on the right side. No numbness or tingling in the legs.  An x-ray was performed, revealing no fractures but significant arthritis in the lumbar spine.  She is currently taking tramadol every eight hours as needed and at bedtime, along with scheduled Tylenol. Heat application to the lower back has also been used which seems to be helpful.    Past Medical History:  Diagnosis Date   Allergic rhinitis due to pollen    some shellfish also   Aortic sclerosis 5/12   On echo. no stenosis   Arthritis    Diastolic dysfunction    stress echo otherwise normal 1/04. Repeat  5/11 also negative   History of seasonal allergies    Hx of basal cell carcinoma 1990   multiple sites   Hx of squamous cell carcinoma of skin 2000   R nasolabial fold   Hyperlipidemia    Osteoarthritis, multiple sites    Osteoporosis    Overactive bladder    Urge incontinence    Vaginal odor 04/2018   aerobic vaginitis on One Swab culture   Past Surgical History:  Procedure Laterality Date   CYSTOCELE REPAIR  2007   and rectocele   DILATION AND CURETTAGE OF UTERUS     INGUINAL HERNIA REPAIR  2012   left side with mesh   REVERSE SHOULDER ARTHROPLASTY Right 04/27/2017   Procedure: REVERSE SHOULDER ARTHROPLASTY;  Surgeon: Christena Flake, MD;  Location: [facility] ORS;  Service: Orthopedics;  Laterality: Right;   TONSILLECTOMY AND ADENOIDECTOMY     as child   TOTAL HIP ARTHROPLASTY Right 05/23/2015   Procedure: TOTAL HIP ARTHROPLASTY ANTERIOR APPROACH;  Surgeon: Kennedy Bucker, MD;  Location: [facility] ORS;  Service: Orthopedics;  Laterality: Right;   UMBILICAL HERNIA REPAIR  02/04/06    Allergies  Allergen Reactions   Cat Dander    Demerol [Meperidine]     dizzy   Dust Mite Extract    Shellfish Allergy    Avelox [Moxifloxacin Hcl In Nacl] Rash    Outpatient Encounter Medications as of 11/09/2023  Medication Sig   acetaminophen (TYLENOL) 500  MG tablet Take 500 mg by mouth 3 (three) times daily. Give one tablet by mouth every 8 hours as needed for pain.   aspirin EC 81 MG tablet Take 81 mg by mouth daily. Swallow whole.   brimonidine (ALPHAGAN) 0.2 % ophthalmic solution Place 1 drop into both eyes 2 (two) times daily.   diclofenac Sodium (VOLTAREN) 1 % GEL Apply 4 g topically 3 (three) times daily. To left knee see other listing for shoulder use   diclofenac Sodium (VOLTAREN) 1 % GEL Apply 2 g topically every 8 (eight) hours as needed (to right shoulder).   dorzolamide-timolol (COSOPT) 22.3-6.8 MG/ML ophthalmic solution Apply 1 drop to eye 2 (two) times daily.   EPIPEN 2-PAK 0.3 MG/0.3ML  SOAJ injection Inject into the muscle as needed for anaphylaxis (or allergic reaction).   fluticasone (FLONASE) 50 MCG/ACT nasal spray Place 1 spray into both nostrils 2 (two) times a day.   latanoprost (XALATAN) 0.005 % ophthalmic solution Place 1 drop into both eyes at bedtime.   levocetirizine (XYZAL) 5 MG tablet Take 5 mg by mouth every evening.   melatonin 3 MG TABS tablet Take 3 mg by mouth at bedtime.   mirtazapine (REMERON) 7.5 MG tablet Take 7.5 mg by mouth at bedtime. Every other day   Multiple Vitamin (MULTIVITAMIN) tablet Take 1 tablet by mouth daily.   Nutritional Supplements (ENSURE CLEAR) LIQD Take 1 Container by mouth with breakfast, with lunch, and with evening meal.   polyethylene glycol (MIRALAX / GLYCOLAX) 17 g packet Take 17 g by mouth daily.   Probiotic Product (PROBIOTIC ADVANCED PO) Take 1 capsule by mouth daily.   Simethicone (GAS-X PO) Take by mouth 2 (two) times daily.   traMADol (ULTRAM) 50 MG tablet Take 1 tablet (50 mg total) by mouth at bedtime. Give one tablet by mouth every 8 hours as needed.   chlorhexidine (PERIDEX) 0.12 % solution Use as directed 10 mLs in the mouth or throat 2 (two) times daily. (Patient not taking: Reported on 08/12/2023)   doxycycline (ADOXA) 100 MG tablet Take 100 mg by mouth 2 (two) times daily. (Patient not taking: Reported on 11/09/2023)   silver sulfADIAZINE (SILVADENE) 1 % cream Apply 1 Application topically 2 (two) times daily. (Patient not taking: Reported on 11/09/2023)   No facility-administered encounter medications on file as of 11/09/2023.    Review of Systems  Constitutional:  Negative for activity change, appetite change, fatigue and unexpected weight change.  HENT:  Negative for congestion and hearing loss.   Eyes: Negative.   Respiratory:  Negative for cough and shortness of breath.   Cardiovascular:  Negative for chest pain, palpitations and leg swelling.  Gastrointestinal:  Negative for abdominal pain, constipation and  diarrhea.  Genitourinary:  Negative for difficulty urinating and dysuria.  Musculoskeletal:  Positive for back pain, gait problem and myalgias. Negative for arthralgias.  Skin:  Negative for color change and wound.  Neurological:  Negative for dizziness and weakness.  Psychiatric/Behavioral:  Positive for confusion. Negative for agitation and behavioral problems.     Immunization History  Administered Date(s) Administered   Influenza, High Dose Seasonal PF 04/28/2017, 05/16/2018, 05/19/2022   Influenza, Seasonal, Injecte, Preservative Fre 05/28/2016   Influenza,inj,Quad PF,6+ Mos 05/25/2013, 05/07/2015   Influenza-Unspecified 04/24/2014, 05/20/2021, 05/26/2023   Moderna Covid-19 Fall Seasonal Vaccine 79yrs & older 11/10/2022   Moderna SARS-COV2 Booster Vaccination 06/14/2020, 12/20/2020, 12/30/2021   Moderna Sars-Covid-2 Vaccination 08/14/2019, 09/11/2019, 06/14/2020, 04/25/2021, 06/12/2022   Pfizer Covid-19 Vaccine Bivalent Booster 66yrs &  up 04/25/2021   Pneumococcal Conjugate-13 02/07/2014   Pneumococcal Polysaccharide-23 02/15/2012, 03/07/2018   Td 09/12/2002, 02/06/2013   Unspecified SARS-COV-2 Vaccination 04/30/2023   Zoster, Live 02/19/2016   Pertinent  Health Maintenance Due  Topic Date Due   INFLUENZA VACCINE  03/03/2024   DEXA SCAN  Completed      07/07/2021    8:00 PM 07/08/2021    8:09 AM 08/13/2021    6:31 AM 12/31/2022    2:58 PM 02/05/2023    1:15 PM  Fall Risk  Falls in the past year?    1 1  Was there an injury with Fall?     0  Fall Risk Category Calculator     2  (RETIRED) Patient Fall Risk Level High fall risk High fall risk Moderate fall risk    Patient at Risk for Falls Due to    History of fall(s);Impaired balance/gait History of fall(s);Impaired balance/gait   Functional Status Survey:    Vitals:   11/09/23 1228  BP: 138/68  Pulse: 68  Resp: 18  Temp: 97.8 F (36.6 C)  SpO2: 94%  Weight: 142 lb (64.4 kg)  Height: 5\' 6"  (1.676 m)   Body mass  index is 22.92 kg/m. Physical Exam Constitutional:      Appearance: Normal appearance.  Cardiovascular:     Rate and Rhythm: Normal rate and regular rhythm.  Pulmonary:     Effort: Pulmonary effort is normal.  Musculoskeletal:     Lumbar back: Tenderness (noted to bilateral spine) present.     Right lower leg: No edema.     Left lower leg: No edema.     Comments: Decrease movement, needing assistance to stand due to pain  Neurological:     Mental Status: She is alert. Mental status is at baseline.  Psychiatric:        Mood and Affect: Mood normal.     Labs reviewed: Recent Labs    08/16/23 0000  NA 135*  K 4.5  CL 104  CO2 24*  BUN 23*  CREATININE 0.7  CALCIUM 9.0   Recent Labs    05/06/23 0000 08/16/23 0000  AST 20 16  ALT 15 7  ALKPHOS 95 94  ALBUMIN 4.2 3.6   Recent Labs    12/31/22 0000 08/16/23 0000  WBC 8.8 7.8  NEUTROABS 3,881.00 2,215.00  HGB 12.6 12.4  HCT 37 38  PLT 280 211   Lab Results  Component Value Date   TSH 1.27 02/06/2013   No results found for: "HGBA1C" Lab Results  Component Value Date   CHOL 232 (H) 02/06/2013   HDL 77.50 02/06/2013   LDLDIRECT 138.4 02/06/2013   TRIG 72.0 02/06/2013   CHOLHDL 3 02/06/2013    Significant Diagnostic Results in last 30 days:  No results found.  Assessment/Plan 1. Acute bilateral low back pain without sciatica (Primary) Pain musculoskeletal from fall with underlying lumbar arthritis. - Continue tramadol every 8 hours as needed and scheduled at bedtime. - Continue scheduled Tylenol. - Apply heat to lower back TID for 5 days scheduled then PRN - Refer to physical therapy.  2. Fall, initial encounter High fall precautions  Educated not to get up without assistance.   Janene Harvey. [facility] [phone]

## 2023-11-10 ENCOUNTER — Telehealth: Payer: Self-pay | Admitting: Family

## 2023-11-10 ENCOUNTER — Non-Acute Institutional Stay (SKILLED_NURSING_FACILITY): Payer: Self-pay | Admitting: Student

## 2023-11-10 ENCOUNTER — Encounter: Payer: Self-pay | Admitting: Student

## 2023-11-10 DIAGNOSIS — R296 Repeated falls: Secondary | ICD-10-CM

## 2023-11-10 DIAGNOSIS — F03B0 Unspecified dementia, moderate, without behavioral disturbance, psychotic disturbance, mood disturbance, and anxiety: Secondary | ICD-10-CM

## 2023-11-10 NOTE — Telephone Encounter (Signed)
 [facility] Nurse called reports patient oxygen saturation was 83 % on Room air.Nurse applied oxygen 2 liters via nasal cannula and oxygen saturation went upto 92-93 %.No shortness of breath,cough or wheezing noted.Order given for portable chest X-ray.continue on oxygen 2 Liters via nasal cannula to keep sats > 93 %.Please follow up.

## 2023-11-10 NOTE — Progress Notes (Unsigned)
 Location:  Other [place].  Nursing Home Room Number: [facility] 202A Place of Service:  SNF [phone]) Provider:  Earnestine Mealing, MD  Patient Care Team: Earnestine Mealing, MD as PCP - General [facility] Medicine)  Extended Emergency Contact Information Primary Emergency Contact: Dibens,Marcia  [place] Home Phone: [phone] Relation: Daughter  Code Status:  DNR Goals of care: Advanced Directive information    10/22/2023    2:01 PM  Advanced Directives  Does Patient Have a Medical Advance Directive? Yes  Type of Advance Directive Out of facility DNR (pink MOST or yellow form)  Does patient want to make changes to medical advance directive? No - Patient declined     Chief Complaint  Patient presents with   Fall    Follow up Fall.     HPI:  Pt is a 89 y.o. female seen today for an acute visit for Fall Follow up  Patient had an unwitnessed fall. Continues to have back pain. X-rays negative for acute fracture. She has poor PO intake today per nursing secondary to pain. She has eaten 1 meal in the last 24 hours.   When discussing with patient, she states something for pain "would be good."  Past Medical History:  Diagnosis Date   Allergic rhinitis due to pollen    some shellfish also   Aortic sclerosis 5/12   On echo. no stenosis   Arthritis    Diastolic dysfunction    stress echo otherwise normal 1/04. Repeat 5/11 also negative   History of seasonal allergies    Hx of basal cell carcinoma 1990   multiple sites   Hx of squamous cell carcinoma of skin 2000   R nasolabial fold   Hyperlipidemia    Osteoarthritis, multiple sites    Osteoporosis    Overactive bladder    Urge incontinence    Vaginal odor 04/2018   aerobic vaginitis on One Swab culture   Past Surgical History:  Procedure Laterality Date   CYSTOCELE REPAIR  2007   and rectocele   DILATION AND CURETTAGE OF UTERUS     INGUINAL HERNIA REPAIR  2012   left side with mesh   REVERSE  SHOULDER ARTHROPLASTY Right 04/27/2017   Procedure: REVERSE SHOULDER ARTHROPLASTY;  Surgeon: Christena Flake, MD;  Location: [facility] ORS;  Service: Orthopedics;  Laterality: Right;   TONSILLECTOMY AND ADENOIDECTOMY     as child   TOTAL HIP ARTHROPLASTY Right 05/23/2015   Procedure: TOTAL HIP ARTHROPLASTY ANTERIOR APPROACH;  Surgeon: Kennedy Bucker, MD;  Location: [facility] ORS;  Service: Orthopedics;  Laterality: Right;   UMBILICAL HERNIA REPAIR  02/04/06    Allergies  Allergen Reactions   Cat Dander    Demerol [Meperidine]     dizzy   Dust Mite Extract    Shellfish Allergy    Avelox [Moxifloxacin Hcl In Nacl] Rash    Outpatient Encounter Medications as of 11/10/2023  Medication Sig   acetaminophen (TYLENOL) 500 MG tablet Take 500 mg by mouth 3 (three) times daily. Give one tablet by mouth every 8 hours as needed for pain.   aspirin EC 81 MG tablet Take 81 mg by mouth daily. Swallow whole.   brimonidine (ALPHAGAN) 0.2 % ophthalmic solution Place 1 drop into both eyes 2 (two) times daily.   diclofenac Sodium (VOLTAREN) 1 % GEL Apply 4 g topically 3 (three) times daily. To left knee see other listing for shoulder use   diclofenac Sodium (VOLTAREN) 1 % GEL Apply 2 g topically  every 8 (eight) hours as needed (to right shoulder).   dorzolamide-timolol (COSOPT) 22.3-6.8 MG/ML ophthalmic solution Apply 1 drop to eye 2 (two) times daily.   EPIPEN 2-PAK 0.3 MG/0.3ML SOAJ injection Inject into the muscle as needed for anaphylaxis (or allergic reaction).   fluticasone (FLONASE) 50 MCG/ACT nasal spray Place 1 spray into both nostrils 2 (two) times a day.   latanoprost (XALATAN) 0.005 % ophthalmic solution Place 1 drop into both eyes at bedtime.   levocetirizine (XYZAL) 5 MG tablet Take 5 mg by mouth every evening.   melatonin 3 MG TABS tablet Take 3 mg by mouth at bedtime.   mirtazapine (REMERON) 7.5 MG tablet Take 7.5 mg by mouth at bedtime. Every other day   Multiple Vitamin (MULTIVITAMIN) tablet Take 1  tablet by mouth daily.   Nutritional Supplements (ENSURE CLEAR) LIQD Take 1 Container by mouth with breakfast, with lunch, and with evening meal.   polyethylene glycol (MIRALAX / GLYCOLAX) 17 g packet Take 17 g by mouth daily.   Probiotic Product (PROBIOTIC ADVANCED PO) Take 1 capsule by mouth daily.   Simethicone (GAS-X PO) Take by mouth 2 (two) times daily.   traMADol (ULTRAM) 50 MG tablet Take 1 tablet (50 mg total) by mouth at bedtime. Give one tablet by mouth every 8 hours as needed.   No facility-administered encounter medications on file as of 11/10/2023.    Review of Systems  Immunization History  Administered Date(s) Administered   Influenza, High Dose Seasonal PF 04/28/2017, 05/16/2018, 05/19/2022   Influenza, Seasonal, Injecte, Preservative Fre 05/28/2016   Influenza,inj,Quad PF,6+ Mos 05/25/2013, 05/07/2015   Influenza-Unspecified 04/24/2014, 05/20/2021, 05/26/2023   Moderna Covid-19 Fall Seasonal Vaccine 51yrs & older 11/10/2022   Moderna SARS-COV2 Booster Vaccination 06/14/2020, 12/20/2020, 12/30/2021   Moderna Sars-Covid-2 Vaccination 08/14/2019, 09/11/2019, 06/14/2020, 04/25/2021, 06/12/2022   Pfizer Covid-19 Vaccine Bivalent Booster 50yrs & up 04/25/2021   Pneumococcal Conjugate-13 02/07/2014   Pneumococcal Polysaccharide-23 02/15/2012, 03/07/2018   Td 09/12/2002, 02/06/2013   Unspecified SARS-COV-2 Vaccination 04/30/2023   Zoster, Live 02/19/2016   Pertinent  Health Maintenance Due  Topic Date Due   INFLUENZA VACCINE  03/03/2024   DEXA SCAN  Completed      07/07/2021    8:00 PM 07/08/2021    8:09 AM 08/13/2021    6:31 AM 12/31/2022    2:58 PM 02/05/2023    1:15 PM  Fall Risk  Falls in the past year?    1 1  Was there an injury with Fall?     0  Fall Risk Category Calculator     2  (RETIRED) Patient Fall Risk Level High fall risk High fall risk Moderate fall risk    Patient at Risk for Falls Due to    History of fall(s);Impaired balance/gait History of  fall(s);Impaired balance/gait   Functional Status Survey:    Vitals:   11/10/23 1147  BP: (!) 130/56  Pulse: 81  Resp: (!) 21  Temp: 98.6 F (37 C)  SpO2: 90%  Weight: 142 lb (64.4 kg)  Height: 5\' 6"  (1.676 m)   Body mass index is 22.92 kg/m. Physical Exam Constitutional:      Comments: Thin, frail  Musculoskeletal:     Comments: Left thoracic back pain with tenderness to palpation   Neurological:     Mental Status: She is alert.     Labs reviewed: Recent Labs    08/16/23 0000  NA 135*  K 4.5  CL 104  CO2 24*  BUN 23*  CREATININE 0.7  CALCIUM 9.0   Recent Labs    05/06/23 0000 08/16/23 0000  AST 20 16  ALT 15 7  ALKPHOS 95 94  ALBUMIN 4.2 3.6   Recent Labs    12/31/22 0000 08/16/23 0000  WBC 8.8 7.8  NEUTROABS 3,881.00 2,215.00  HGB 12.6 12.4  HCT 37 38  PLT 280 211   Lab Results  Component Value Date   TSH 1.27 02/06/2013   No results found for: "HGBA1C" Lab Results  Component Value Date   CHOL 232 (H) 02/06/2013   HDL 77.50 02/06/2013   LDLDIRECT 138.4 02/06/2013   TRIG 72.0 02/06/2013   CHOLHDL 3 02/06/2013    Significant Diagnostic Results in last 30 days:  No results found.  Assessment/Plan Recurrent falls  Moderate dementia without behavioral disturbance, psychotic disturbance, mood disturbance, or anxiety, unspecified dementia type [facility]) Patient with history of dementia and recurrent falls. Unwitnessed fall without fracture, however, continued pain. Discussed scheduling tramadol q6 hours for 24 hours. Heating pads as tolerated. Discussed concern for combination of muscle relaxers with pain medication, will defer at this time.   Family/ staff Communication: nursing  Labs/tests ordered:  none

## 2023-11-11 ENCOUNTER — Encounter: Payer: Self-pay | Admitting: Student

## 2023-11-11 ENCOUNTER — Non-Acute Institutional Stay (SKILLED_NURSING_FACILITY): Payer: Self-pay | Admitting: Nurse Practitioner

## 2023-11-11 DIAGNOSIS — M7581 Other shoulder lesions, right shoulder: Secondary | ICD-10-CM

## 2023-11-11 DIAGNOSIS — J22 Unspecified acute lower respiratory infection: Secondary | ICD-10-CM

## 2023-11-11 DIAGNOSIS — M545 Low back pain, unspecified: Secondary | ICD-10-CM

## 2023-11-11 DIAGNOSIS — F03B0 Unspecified dementia, moderate, without behavioral disturbance, psychotic disturbance, mood disturbance, and anxiety: Secondary | ICD-10-CM | POA: Diagnosis not present

## 2023-11-11 LAB — CBC AND DIFFERENTIAL
HCT: 36 (ref 36–46)
Hemoglobin: 11.8 — AB (ref 12.0–16.0)
Platelets: 236 10*3/uL (ref 150–400)
WBC: 12.1

## 2023-11-11 LAB — BASIC METABOLIC PANEL WITH GFR
BUN: 28 — AB (ref 4–21)
CO2: 25 — AB (ref 13–22)
Chloride: 103 (ref 99–108)
Creatinine: 0.7 (ref 0.5–1.1)
Glucose: 87
Potassium: 4.1 meq/L (ref 3.5–5.1)
Sodium: 138 (ref 137–147)

## 2023-11-11 LAB — HEPATIC FUNCTION PANEL
ALT: 11 U/L (ref 7–35)
AST: 22 (ref 13–35)
Alkaline Phosphatase: 90 (ref 25–125)
Bilirubin, Total: 0.5

## 2023-11-11 LAB — COMPREHENSIVE METABOLIC PANEL WITH GFR
Albumin: 3.6 (ref 3.5–5.0)
Calcium: 8.8 (ref 8.7–10.7)
Globulin: 2.6
eGFR: 76

## 2023-11-11 LAB — CBC: RBC: 3.71 — AB (ref 3.87–5.11)

## 2023-11-11 MED ORDER — TRAMADOL HCL 50 MG PO TABS: ORAL_TABLET | ORAL | 0 refills | Status: DC

## 2023-11-11 NOTE — Progress Notes (Signed)
 Location:  [facility] Room Number: [facility] 202A Place of Service:  SNF [phone])  Sydnee Cabal, [place], MD  Patient Care Team: Earnestine Mealing, MD as PCP - General [facility] Medicine)  Extended Emergency Contact Information Primary Emergency Contact: Dibens,Marcia  [place] Home Phone: [phone] Relation: Daughter  Goals of care: Advanced Directive information    10/22/2023    2:01 PM  Advanced Directives  Does Patient Have a Medical Advance Directive? Yes  Type of Advance Directive Out of facility DNR (pink MOST or yellow form)  Does patient want to make changes to medical advance directive? No - Patient declined     Chief Complaint  Patient presents with   Acute Visit    Decrease appetite, increase pain.  Drop in sats     HPI:  Pt is a 89 y.o. female seen today for an acute visit for respiratory concerns.  Pt had fall 2 days ago, xray negative but continues to be in a lot of pain and moving around as she generally does.  She was noted to be breathing differently/ increase RR and O2 noted at 83% on RA so 2L o2 applied. O2 improved to 93%.  She has been eating and drinking minimally over the last 2 days. She did wake up and eat breakfast and lunch today (with assistance of staff).  Temp noted to be elevated at 99.8.    Past Medical History:  Diagnosis Date   Allergic rhinitis due to pollen    some shellfish also   Aortic sclerosis 5/12   On echo. no stenosis   Arthritis    Diastolic dysfunction    stress echo otherwise normal 1/04. Repeat 5/11 also negative   History of seasonal allergies    Hx of basal cell carcinoma 1990   multiple sites   Hx of squamous cell carcinoma of skin 2000   R nasolabial fold   Hyperlipidemia    Osteoarthritis, multiple sites    Osteoporosis    Overactive bladder    Urge incontinence    Vaginal odor 04/2018   aerobic vaginitis on One Swab culture   Past Surgical History:  Procedure  Laterality Date   CYSTOCELE REPAIR  2007   and rectocele   DILATION AND CURETTAGE OF UTERUS     INGUINAL HERNIA REPAIR  2012   left side with mesh   REVERSE SHOULDER ARTHROPLASTY Right 04/27/2017   Procedure: REVERSE SHOULDER ARTHROPLASTY;  Surgeon: Christena Flake, MD;  Location: [facility] ORS;  Service: Orthopedics;  Laterality: Right;   TONSILLECTOMY AND ADENOIDECTOMY     as child   TOTAL HIP ARTHROPLASTY Right 05/23/2015   Procedure: TOTAL HIP ARTHROPLASTY ANTERIOR APPROACH;  Surgeon: Kennedy Bucker, MD;  Location: [facility] ORS;  Service: Orthopedics;  Laterality: Right;   UMBILICAL HERNIA REPAIR  02/04/06    Allergies  Allergen Reactions   Cat Dander    Demerol [Meperidine]     dizzy   Dust Mite Extract    Shellfish Allergy    Avelox [Moxifloxacin Hcl In Nacl] Rash    Outpatient Encounter Medications as of 11/11/2023  Medication Sig   acetaminophen (TYLENOL) 500 MG tablet Take 500 mg by mouth 3 (three) times daily. Give one tablet by mouth every 8 hours as needed for pain.   aspirin EC 81 MG tablet Take 81 mg by mouth daily. Swallow whole.   brimonidine (ALPHAGAN) 0.2 % ophthalmic solution Place 1 drop into both eyes 2 (two) times daily.  diclofenac Sodium (VOLTAREN) 1 % GEL Apply 4 g topically 3 (three) times daily. To left knee see other listing for shoulder use   diclofenac Sodium (VOLTAREN) 1 % GEL Apply 2 g topically every 8 (eight) hours as needed (to right shoulder).   dorzolamide-timolol (COSOPT) 22.3-6.8 MG/ML ophthalmic solution Apply 1 drop to eye 2 (two) times daily.   EPIPEN 2-PAK 0.3 MG/0.3ML SOAJ injection Inject into the muscle as needed for anaphylaxis (or allergic reaction).   fluticasone (FLONASE) 50 MCG/ACT nasal spray Place 1 spray into both nostrils 2 (two) times a day.   latanoprost (XALATAN) 0.005 % ophthalmic solution Place 1 drop into both eyes at bedtime.   levocetirizine (XYZAL) 5 MG tablet Take 5 mg by mouth every evening.   melatonin 3 MG TABS tablet Take 3  mg by mouth at bedtime.   mirtazapine (REMERON) 7.5 MG tablet Take 7.5 mg by mouth at bedtime. Every other day   Multiple Vitamin (MULTIVITAMIN) tablet Take 1 tablet by mouth daily.   Nutritional Supplements (ENSURE CLEAR) LIQD Take 1 Container by mouth with breakfast, with lunch, and with evening meal.   polyethylene glycol (MIRALAX / GLYCOLAX) 17 g packet Take 17 g by mouth daily.   Probiotic Product (PROBIOTIC ADVANCED PO) Take 1 capsule by mouth daily.   Simethicone (GAS-X PO) Take by mouth 2 (two) times daily.   traMADol (ULTRAM) 50 MG tablet Scheduled q 4 hours while awake for 1 week (5 times daily) then resume q 8 hours PRN with scheduled bedtime.   [DISCONTINUED] traMADol (ULTRAM) 50 MG tablet Take 1 tablet (50 mg total) by mouth at bedtime. Give one tablet by mouth every 8 hours as needed.   No facility-administered encounter medications on file as of 11/11/2023.    Review of Systems  Unable to perform ROS: Dementia    Immunization History  Administered Date(s) Administered   Influenza, High Dose Seasonal PF 04/28/2017, 05/16/2018, 05/19/2022   Influenza, Seasonal, Injecte, Preservative Fre 05/28/2016   Influenza,inj,Quad PF,6+ Mos 05/25/2013, 05/07/2015   Influenza-Unspecified 04/24/2014, 05/20/2021, 05/26/2023   Moderna Covid-19 Fall Seasonal Vaccine 45yrs & older 11/10/2022   Moderna SARS-COV2 Booster Vaccination 06/14/2020, 12/20/2020, 12/30/2021   Moderna Sars-Covid-2 Vaccination 08/14/2019, 09/11/2019, 06/14/2020, 04/25/2021, 06/12/2022   Pfizer Covid-19 Vaccine Bivalent Booster 86yrs & up 04/25/2021   Pneumococcal Conjugate-13 02/07/2014   Pneumococcal Polysaccharide-23 02/15/2012, 03/07/2018   Td 09/12/2002, 02/06/2013   Unspecified SARS-COV-2 Vaccination 04/30/2023   Zoster, Live 02/19/2016   Pertinent  Health Maintenance Due  Topic Date Due   INFLUENZA VACCINE  03/03/2024   DEXA SCAN  Completed      07/07/2021    8:00 PM 07/08/2021    8:09 AM 08/13/2021     6:31 AM 12/31/2022    2:58 PM 02/05/2023    1:15 PM  Fall Risk  Falls in the past year?    1 1  Was there an injury with Fall?     0  Fall Risk Category Calculator     2  (RETIRED) Patient Fall Risk Level High fall risk High fall risk Moderate fall risk    Patient at Risk for Falls Due to    History of fall(s);Impaired balance/gait History of fall(s);Impaired balance/gait   Functional Status Survey:    Vitals:   11/11/23 1228  BP: (!) 155/82  Pulse: 87  Resp: 20  Temp: 99.8 F (37.7 C)  SpO2: 93%   There is no height or weight on file to calculate BMI. Physical Exam Constitutional:  General: She is not in acute distress.    Appearance: She is well-developed. She is not diaphoretic.  HENT:     Head: Normocephalic and atraumatic.     Mouth/Throat:     Pharynx: No oropharyngeal exudate.  Eyes:     Conjunctiva/sclera: Conjunctivae normal.     Pupils: Pupils are equal, round, and reactive to light.  Cardiovascular:     Rate and Rhythm: Normal rate and regular rhythm.     Heart sounds: Normal heart sounds.  Pulmonary:     Effort: Pulmonary effort is normal.     Breath sounds: Rhonchi (to right lower lobe) present.  Abdominal:     General: Bowel sounds are normal.     Palpations: Abdomen is soft.  Musculoskeletal:     Cervical back: Normal range of motion and neck supple.     Right lower leg: No edema.     Left lower leg: No edema.  Skin:    General: Skin is warm and dry.  Neurological:     Mental Status: She is alert.  Psychiatric:        Mood and Affect: Mood normal.     Labs reviewed: Recent Labs    08/16/23 0000  NA 135*  K 4.5  CL 104  CO2 24*  BUN 23*  CREATININE 0.7  CALCIUM 9.0   Recent Labs    05/06/23 0000 08/16/23 0000  AST 20 16  ALT 15 7  ALKPHOS 95 94  ALBUMIN 4.2 3.6   Recent Labs    12/31/22 0000 08/16/23 0000  WBC 8.8 7.8  NEUTROABS 3,881.00 2,215.00  HGB 12.6 12.4  HCT 37 38  PLT 280 211   Lab Results  Component Value  Date   TSH 1.27 02/06/2013   No results found for: "HGBA1C" Lab Results  Component Value Date   CHOL 232 (H) 02/06/2013   HDL 77.50 02/06/2013   LDLDIRECT 138.4 02/06/2013   TRIG 72.0 02/06/2013   CHOLHDL 3 02/06/2013    Significant Diagnostic Results in last 30 days:  No results found.  Assessment/Plan 1. Lower respiratory infection (e.g., bronchitis, pneumonia, pneumonitis, pulmonitis) (Primary) Increase in fatigue with decrease in oral intake over the last 2 days, suspect pneumonia, treat with Augmentin BID and doxycycline BID for 7 days  -cbc, bmp  -florastor BID for 10 days.  -encourage hydration -continue 02 to keep sats over 90%  2. Acute bilateral low back pain without sciatica Ongoing will schedule tramadol 6am, 12noon and 4 pm to help with pain control. Continues to have dose at bedtime scheduled.  Hold for sedation   3. Moderate dementia without behavioral disturbance, psychotic disturbance, mood disturbance, or anxiety, unspecified dementia type (HCC) Continue supportive care   Audryana Hockenberry K. Biagio Borg [facility] [phone]

## 2023-11-12 ENCOUNTER — Non-Acute Institutional Stay (SKILLED_NURSING_FACILITY): Payer: Self-pay | Admitting: Student

## 2023-11-12 ENCOUNTER — Encounter: Payer: Self-pay | Admitting: Student

## 2023-11-12 DIAGNOSIS — J22 Unspecified acute lower respiratory infection: Secondary | ICD-10-CM

## 2023-11-12 DIAGNOSIS — Z515 Encounter for palliative care: Secondary | ICD-10-CM | POA: Diagnosis not present

## 2023-11-12 DIAGNOSIS — M545 Low back pain, unspecified: Secondary | ICD-10-CM

## 2023-11-12 DIAGNOSIS — F03B0 Unspecified dementia, moderate, without behavioral disturbance, psychotic disturbance, mood disturbance, and anxiety: Secondary | ICD-10-CM | POA: Diagnosis not present

## 2023-11-12 DIAGNOSIS — I5032 Chronic diastolic (congestive) heart failure: Secondary | ICD-10-CM

## 2023-11-12 DIAGNOSIS — E441 Mild protein-calorie malnutrition: Secondary | ICD-10-CM

## 2023-11-12 NOTE — Progress Notes (Signed)
 Location:  Other [facility]  Nursing Home Room Number: [facility] 202A Place of Service:  SNF [phone]) Provider:  Valrie Gehrig, MD  Patient Care Team: Valrie Gehrig, MD as PCP - General [facility] Medicine)  Extended Emergency Contact Information Primary Emergency Contact: Dibens,Marcia  [place] Home Phone: [phone] Relation: Daughter  Code Status:  DNR Goals of care: Advanced Directive information    10/22/2023    2:01 PM  Advanced Directives  Does Patient Have a Medical Advance Directive? Yes  Type of Advance Directive Out of facility DNR (pink MOST or yellow form)  Does patient want to make changes to medical advance directive? No - Patient declined     Chief Complaint  Patient presents with   Pain Management    Pain Management    HPI:  Pt is a 89 y.o. female seen today for an acute visit for Pain Management   Patient diagnosed with lower respiratory infection. Soft tissue pain after fall.   She says she would like something for pain. She is uncomfortable.   Per nursing she has not been eating well and endorses significant pain.   Past Medical History:  Diagnosis Date   Allergic rhinitis due to pollen    some shellfish also   Aortic sclerosis 5/12   On echo. no stenosis   Arthritis    Diastolic dysfunction    stress echo otherwise normal 1/04. Repeat 5/11 also negative   History of seasonal allergies    Hx of basal cell carcinoma 1990   multiple sites   Hx of squamous cell carcinoma of skin 2000   R nasolabial fold   Hyperlipidemia    Osteoarthritis, multiple sites    Osteoporosis    Overactive bladder    Urge incontinence    Vaginal odor 04/2018   aerobic vaginitis on One Swab culture   Past Surgical History:  Procedure Laterality Date   CYSTOCELE REPAIR  2007   and rectocele   DILATION AND CURETTAGE OF UTERUS     INGUINAL HERNIA REPAIR  2012   left side with mesh   REVERSE SHOULDER ARTHROPLASTY Right 04/27/2017    Procedure: REVERSE SHOULDER ARTHROPLASTY;  Surgeon: Elner Hahn, MD;  Location: [facility] ORS;  Service: Orthopedics;  Laterality: Right;   TONSILLECTOMY AND ADENOIDECTOMY     as child   TOTAL HIP ARTHROPLASTY Right 05/23/2015   Procedure: TOTAL HIP ARTHROPLASTY ANTERIOR APPROACH;  Surgeon: Molli Angelucci, MD;  Location: [facility] ORS;  Service: Orthopedics;  Laterality: Right;   UMBILICAL HERNIA REPAIR  02/04/06    Allergies  Allergen Reactions   Cat Dander    Demerol [Meperidine]     dizzy   Dust Mite Extract    Shellfish Allergy    Avelox [Moxifloxacin Hcl In Nacl] Rash    Outpatient Encounter Medications as of 11/12/2023  Medication Sig   acetaminophen (TYLENOL) 500 MG tablet Take 500 mg by mouth 3 (three) times daily. Give one tablet by mouth every 8 hours as needed for pain.   amoxicillin-clavulanate (AUGMENTIN) 400-57 MG/5ML suspension Take 10.9 mLs by mouth 2 (two) times daily.   aspirin EC 81 MG tablet Take 81 mg by mouth daily. Swallow whole.   brimonidine (ALPHAGAN) 0.2 % ophthalmic solution Place 1 drop into both eyes 2 (two) times daily.   diclofenac Sodium (VOLTAREN) 1 % GEL Apply 4 g topically 3 (three) times daily. To left knee see other listing for shoulder use   diclofenac Sodium (VOLTAREN) 1 % GEL  Apply 2 g topically every 8 (eight) hours as needed (to right shoulder).   dorzolamide-timolol (COSOPT) 22.3-6.8 MG/ML ophthalmic solution Apply 1 drop to eye 2 (two) times daily.   doxycycline (DORYX) 100 MG EC tablet Take 100 mg by mouth 2 (two) times daily.   EPIPEN 2-PAK 0.3 MG/0.3ML SOAJ injection Inject into the muscle as needed for anaphylaxis (or allergic reaction).   fluticasone (FLONASE) 50 MCG/ACT nasal spray Place 1 spray into both nostrils 2 (two) times a day.   latanoprost (XALATAN) 0.005 % ophthalmic solution Place 1 drop into both eyes at bedtime.   levocetirizine (XYZAL) 5 MG tablet Take 5 mg by mouth every evening.   melatonin 3 MG TABS tablet Take 3 mg by mouth at  bedtime.   mirtazapine (REMERON) 7.5 MG tablet Take 7.5 mg by mouth at bedtime. Every other day   Multiple Vitamin (MULTIVITAMIN) tablet Take 1 tablet by mouth daily.   Nutritional Supplements (ENSURE CLEAR) LIQD Take 1 Container by mouth with breakfast, with lunch, and with evening meal.   ondansetron (ZOFRAN) 4 MG tablet Take 4 mg by mouth every 8 (eight) hours as needed for nausea or vomiting.   OXYGEN Inhale into the lungs. 2lpm   polyethylene glycol (MIRALAX / GLYCOLAX) 17 g packet Take 17 g by mouth daily.   Probiotic Product (PROBIOTIC ADVANCED PO) Take 1 capsule by mouth daily.   Simethicone (GAS-X PO) Take by mouth 2 (two) times daily.   traMADol (ULTRAM) 50 MG tablet Take 50 mg by mouth every 8 (eight) hours as needed. Give one tablet by mouth at bedtime   [DISCONTINUED] traMADol (ULTRAM) 50 MG tablet Scheduled q 4 hours while awake for 1 week (5 times daily) then resume q 8 hours PRN with scheduled bedtime.   No facility-administered encounter medications on file as of 11/12/2023.    Review of Systems  Immunization History  Administered Date(s) Administered   Influenza, High Dose Seasonal PF 04/28/2017, 05/16/2018, 05/19/2022   Influenza, Seasonal, Injecte, Preservative Fre 05/28/2016   Influenza,inj,Quad PF,6+ Mos 05/25/2013, 05/07/2015   Influenza-Unspecified 04/24/2014, 05/20/2021, 05/26/2023   Moderna Covid-19 Fall Seasonal Vaccine 35yrs & older 11/10/2022   Moderna SARS-COV2 Booster Vaccination 06/14/2020, 12/20/2020, 12/30/2021   Moderna Sars-Covid-2 Vaccination 08/14/2019, 09/11/2019, 06/14/2020, 04/25/2021, 06/12/2022   Pfizer Covid-19 Vaccine Bivalent Booster 11yrs & up 04/25/2021   Pneumococcal Conjugate-13 02/07/2014   Pneumococcal Polysaccharide-23 02/15/2012, 03/07/2018   Td 09/12/2002, 02/06/2013   Unspecified SARS-COV-2 Vaccination 04/30/2023   Zoster, Live 02/19/2016   Pertinent  Health Maintenance Due  Topic Date Due   INFLUENZA VACCINE  03/03/2024    DEXA SCAN  Completed      07/07/2021    8:00 PM 07/08/2021    8:09 AM 08/13/2021    6:31 AM 12/31/2022    2:58 PM 02/05/2023    1:15 PM  Fall Risk  Falls in the past year?    1 1  Was there an injury with Fall?     0  Fall Risk Category Calculator     2  (RETIRED) Patient Fall Risk Level High fall risk High fall risk Moderate fall risk    Patient at Risk for Falls Due to    History of fall(s);Impaired balance/gait History of fall(s);Impaired balance/gait   Functional Status Survey:    Vitals:   11/12/23 0856  BP: 116/77  Pulse: 77  Resp: 18  Temp: 97.6 F (36.4 C)  SpO2: 92%  Weight: 142 lb (64.4 kg)  Height: 5\' 6"  (1.676  m)   Body mass index is 22.92 kg/m. Physical Exam Constitutional:      Comments: Fetal position, thin, frail  Cardiovascular:     Rate and Rhythm: Normal rate.     Pulses: Normal pulses.  Pulmonary:     Effort: Pulmonary effort is normal.  Chest:     Chest wall: Tenderness present.  Abdominal:     General: Abdomen is flat.     Palpations: Abdomen is soft.  Neurological:     Mental Status: Mental status is at baseline.     Labs reviewed: Recent Labs    08/16/23 0000 11/11/23 0000  NA 135* 138  K 4.5 4.1  CL 104 103  CO2 24* 25*  BUN 23* 28*  CREATININE 0.7 0.7  CALCIUM 9.0 8.8   Recent Labs    05/06/23 0000 08/16/23 0000 11/11/23 0000  AST 20 16 22   ALT 15 7 11   ALKPHOS 95 94 90  ALBUMIN 4.2 3.6 3.6   Recent Labs    12/31/22 0000 08/16/23 0000 11/11/23 0000  WBC 8.8 7.8 12.1  NEUTROABS 3,881.00 2,215.00  --   HGB 12.6 12.4 11.8*  HCT 37 38 36  PLT 280 211 236   Lab Results  Component Value Date   TSH 1.27 02/06/2013   No results found for: "HGBA1C" Lab Results  Component Value Date   CHOL 232 (H) 02/06/2013   HDL 77.50 02/06/2013   LDLDIRECT 138.4 02/06/2013   TRIG 72.0 02/06/2013   CHOLHDL 3 02/06/2013    Significant Diagnostic Results in last 30 days:  No results found.  Assessment/Plan End of life care  - Plan: LORazepam (ATIVAN) 0.5 MG tablet, morphine (ROXANOL) 20 MG/ML concentrated solution  Lower respiratory infection (e.g., bronchitis, pneumonia, pneumonitis, pulmonitis)  Acute bilateral low back pain without sciatica  Moderate dementia without behavioral disturbance, psychotic disturbance, mood disturbance, or anxiety, unspecified dementia type (HCC)  Chronic diastolic heart failure (HCC)  Malnutrition of mild degree (HCC) Patient with severe pain and is at high risk for decline. Attempted to contact family to discuss concerns and unable to reach. Patient has not gotten out of bed and is in significant pain. Will provide comfort measures with ativan and morphine. Patient's Dementia is at a stage 6a at this time. She has had notable decline over the last few months with more daytime somnolence and poor PO intake. DNH and DNR honored per patient/family preferences.   Family/ staff Communication: Attempted to call all three daughters, VM left with nursing staff. Nursing.   Labs/tests ordered:  None

## 2023-11-13 ENCOUNTER — Encounter: Payer: Self-pay | Admitting: Student

## 2023-11-13 MED ORDER — LORAZEPAM 0.5 MG PO TABS: 0.5000 mg | ORAL_TABLET | Freq: Three times a day (TID) | ORAL | 0 refills | Status: DC | PRN

## 2023-11-13 MED ORDER — MORPHINE SULFATE (CONCENTRATE) 20 MG/ML PO SOLN: 5.0000 mg | ORAL | 0 refills | Status: DC | PRN

## 2023-11-25 ENCOUNTER — Encounter: Payer: Self-pay | Admitting: Radiology

## 2023-12-03 LAB — COMPREHENSIVE METABOLIC PANEL WITH GFR
Albumin: 3.6 (ref 3.5–5.0)
Calcium: 8.9 (ref 8.7–10.7)
Globulin: 2.6
eGFR: 69

## 2023-12-03 LAB — CBC AND DIFFERENTIAL
HCT: 38 (ref 36–46)
Hemoglobin: 12.6 (ref 12.0–16.0)
Neutrophils Absolute: 3838
Platelets: 288 10*3/uL (ref 150–400)
WBC: 9.5

## 2023-12-03 LAB — BASIC METABOLIC PANEL WITH GFR
BUN: 22 — AB (ref 4–21)
CO2: 25 — AB (ref 13–22)
Chloride: 103 (ref 99–108)
Creatinine: 0.8 (ref 0.5–1.1)
Glucose: 96
Potassium: 3.9 meq/L (ref 3.5–5.1)
Sodium: 137 (ref 137–147)

## 2023-12-03 LAB — CBC: RBC: 3.89 (ref 3.87–5.11)

## 2023-12-03 LAB — HEPATIC FUNCTION PANEL
ALT: 10 U/L (ref 7–35)
AST: 19 (ref 13–35)
Bilirubin, Total: 0.4

## 2023-12-14 ENCOUNTER — Other Ambulatory Visit: Payer: Self-pay | Admitting: Nurse Practitioner

## 2023-12-14 MED ORDER — TRAMADOL HCL 50 MG PO TABS: 50.0000 mg | ORAL_TABLET | Freq: Three times a day (TID) | ORAL | 0 refills | Status: DC | PRN

## 2023-12-16 ENCOUNTER — Encounter: Payer: Self-pay | Admitting: Nurse Practitioner

## 2023-12-16 ENCOUNTER — Non-Acute Institutional Stay (SKILLED_NURSING_FACILITY): Payer: Self-pay | Admitting: Nurse Practitioner

## 2023-12-16 DIAGNOSIS — E441 Mild protein-calorie malnutrition: Secondary | ICD-10-CM | POA: Diagnosis not present

## 2023-12-16 DIAGNOSIS — I872 Venous insufficiency (chronic) (peripheral): Secondary | ICD-10-CM

## 2023-12-16 DIAGNOSIS — I5032 Chronic diastolic (congestive) heart failure: Secondary | ICD-10-CM | POA: Diagnosis not present

## 2023-12-16 DIAGNOSIS — F03B0 Unspecified dementia, moderate, without behavioral disturbance, psychotic disturbance, mood disturbance, and anxiety: Secondary | ICD-10-CM

## 2023-12-16 DIAGNOSIS — K5909 Other constipation: Secondary | ICD-10-CM | POA: Diagnosis not present

## 2023-12-16 DIAGNOSIS — I700 Atherosclerosis of aorta: Secondary | ICD-10-CM

## 2023-12-16 DIAGNOSIS — J301 Allergic rhinitis due to pollen: Secondary | ICD-10-CM

## 2023-12-16 NOTE — Assessment & Plan Note (Signed)
Continues on ASA.

## 2023-12-16 NOTE — Assessment & Plan Note (Signed)
 Stable, no acute changes in cognitive or functional status, continue supportive care.

## 2023-12-16 NOTE — Assessment & Plan Note (Signed)
 Euvolemic at this time, continue to monitor.

## 2023-12-16 NOTE — Assessment & Plan Note (Signed)
 Stable, continues to xyzal .

## 2023-12-16 NOTE — Assessment & Plan Note (Signed)
 Stable, continue with compression hose and elevation as able.

## 2023-12-16 NOTE — Progress Notes (Signed)
 Location:  Other [place].  Nursing Home Room Number: [facility] 202A Place of Service:  SNF (31) Gilbert Lab, NP  PCP: Valrie Gehrig, MD  Patient Care Team: Valrie Gehrig, MD as PCP - General [facility])  Extended Emergency Contact Information Primary Emergency Contact: Dibens,Marcia  [place] Home Phone: [phone] Relation: Daughter  Goals of care: Advanced Directive information    10/22/2023    2:01 PM  Advanced Directives  Does Patient Have a Medical Advance Directive? Yes  Type of Advance Directive Out of facility DNR (pink MOST or yellow form)  Does patient want to make changes to medical advance directive? No - Patient declined     Chief Complaint  Patient presents with   Medical Management of Chronic Issues    Medical Management of Chronic Issues.     HPI:  Pt is a 89 y.o. female seen today for medical management of chronic disease. Pt has done remarkably well over the last few weeks.  She had a fall and then developed pneumonia and had severe pain. Currently he is up, just finished breakfast.  Staff reports she does not have complain of the severe pain as she did.  She has been transitioned from morphine  back to tramadol  and doing well. She denies pain during our visit.  She is able to stand and transfer without pain/yelling.  She had some anxiety but this has improved.  She is feeding herself  However she has had considerable weight loss since her fall early April.  Has a skin tear followed by nursing/wound care. No signs of infection reported.     Past Medical History:  Diagnosis Date   Allergic rhinitis due to pollen    some shellfish also   Aortic sclerosis 5/12   On echo. no stenosis   Arthritis    Diastolic dysfunction    stress echo otherwise normal 1/04. Repeat 5/11 also negative   History of seasonal allergies    Hx of basal cell carcinoma 1990   multiple sites   Hx of squamous cell carcinoma of skin  2000   R nasolabial fold   Hyperlipidemia    Osteoarthritis, multiple sites    Osteoporosis    Overactive bladder    Urge incontinence    Vaginal odor 04/2018   aerobic vaginitis on One Swab culture   Past Surgical History:  Procedure Laterality Date   CYSTOCELE REPAIR  2007   and rectocele   DILATION AND CURETTAGE OF UTERUS     INGUINAL HERNIA REPAIR  2012   left side with mesh   REVERSE SHOULDER ARTHROPLASTY Right 04/27/2017   Procedure: REVERSE SHOULDER ARTHROPLASTY;  Surgeon: Elner Hahn, MD;  Location: [facility] ORS;  Service: Orthopedics;  Laterality: Right;   TONSILLECTOMY AND ADENOIDECTOMY     as child   TOTAL HIP ARTHROPLASTY Right 05/23/2015   Procedure: TOTAL HIP ARTHROPLASTY ANTERIOR APPROACH;  Surgeon: Molli Angelucci, MD;  Location: [facility] ORS;  Service: Orthopedics;  Laterality: Right;   UMBILICAL HERNIA REPAIR  02/04/06    Allergies  Allergen Reactions   Cat Dander    Demerol [Meperidine]     dizzy   Dust Mite Extract    Shellfish Allergy    Avelox  [Moxifloxacin  Hcl In Nacl] Rash    Outpatient Encounter Medications as of 12/16/2023  Medication Sig   acetaminophen  (TYLENOL ) 500 MG tablet Take 500 mg by mouth 3 (three) times daily. Give one tablet by mouth every 8 hours as needed for pain.  aspirin  EC 81 MG tablet Take 81 mg by mouth daily. Swallow whole.   brimonidine (ALPHAGAN) 0.2 % ophthalmic solution Place 1 drop into both eyes 2 (two) times daily.   diclofenac Sodium (VOLTAREN) 1 % GEL Apply 4 g topically 3 (three) times daily. To left knee see other listing for shoulder use   diclofenac Sodium (VOLTAREN) 1 % GEL Apply 2 g topically every 8 (eight) hours as needed (to right shoulder).   dorzolamide -timolol  (COSOPT ) 22.3-6.8 MG/ML ophthalmic solution Apply 1 drop to eye 2 (two) times daily.   EPIPEN  2-PAK 0.3 MG/0.3ML SOAJ injection Inject into the muscle as needed for anaphylaxis (or allergic reaction).   fluticasone  (FLONASE ) 50 MCG/ACT nasal spray Place 1  spray into both nostrils 2 (two) times a day.   latanoprost  (XALATAN ) 0.005 % ophthalmic solution Place 1 drop into both eyes at bedtime.   levocetirizine (XYZAL ) 5 MG tablet Take 5 mg by mouth every evening.   melatonin 3 MG TABS tablet Take 3 mg by mouth at bedtime.   mirtazapine (REMERON) 15 MG tablet Take 15 mg by mouth at bedtime.   Multiple Vitamin (MULTIVITAMIN) tablet Take 1 tablet by mouth daily.   Nutritional Supplements (ENSURE CLEAR) LIQD Take 1 Container by mouth with breakfast, with lunch, and with evening meal.   OXYGEN Inhale into the lungs. 2lpm   polyethylene glycol (MIRALAX  / GLYCOLAX ) 17 g packet Take 17 g by mouth daily.   Probiotic Product (PROBIOTIC ADVANCED PO) Take 1 capsule by mouth daily.   Simethicone  (GAS-X PO) Take by mouth 2 (two) times daily.   traMADol  (ULTRAM ) 50 MG tablet Take 1 tablet (50 mg total) by mouth every 8 (eight) hours as needed. Give one tablet by mouth at bedtime   [DISCONTINUED] amoxicillin -clavulanate (AUGMENTIN ) 400-57 MG/5ML suspension Take 10.9 mLs by mouth 2 (two) times daily. (Patient not taking: Reported on 12/16/2023)   [DISCONTINUED] doxycycline (DORYX) 100 MG EC tablet Take 100 mg by mouth 2 (two) times daily. (Patient not taking: Reported on 12/16/2023)   [DISCONTINUED] LORazepam  (ATIVAN ) 0.5 MG tablet Take 1 tablet (0.5 mg total) by mouth every 8 (eight) hours as needed for anxiety (AGITATION). (Patient not taking: Reported on 12/16/2023)   [DISCONTINUED] mirtazapine (REMERON) 7.5 MG tablet Take 7.5 mg by mouth at bedtime. Every other day   [DISCONTINUED] ondansetron  (ZOFRAN ) 4 MG tablet Take 4 mg by mouth every 8 (eight) hours as needed for nausea or vomiting. (Patient not taking: Reported on 12/16/2023)   No facility-administered encounter medications on file as of 12/16/2023.    Review of Systems  Unable to perform ROS: Dementia     Immunization History  Administered Date(s) Administered   Influenza, High Dose Seasonal PF  04/28/2017, 05/16/2018, 05/19/2022   Influenza, Seasonal, Injecte, Preservative Fre 05/28/2016   Influenza,inj,Quad PF,6+ Mos 05/25/2013, 05/07/2015   Influenza-Unspecified 04/24/2014, 05/20/2021, 05/26/2023   Moderna Covid-19 Fall Seasonal Vaccine 52yrs & older 11/10/2022   Moderna SARS-COV2 Booster Vaccination 06/14/2020, 12/20/2020, 12/30/2021   Moderna Sars-Covid-2 Vaccination 08/14/2019, 09/11/2019, 06/14/2020, 04/25/2021, 06/12/2022   Pfizer Covid-19 Vaccine Bivalent Booster 11yrs & up 04/25/2021   Pneumococcal Conjugate-13 02/07/2014   Pneumococcal Polysaccharide-23 02/15/2012, 03/07/2018   Td 09/12/2002, 02/06/2013   Unspecified SARS-COV-2 Vaccination 04/30/2023, 11/26/2023   Zoster, Live 02/19/2016   Pertinent  Health Maintenance Due  Topic Date Due   INFLUENZA VACCINE  03/03/2024   DEXA SCAN  Completed      07/07/2021    8:00 PM 07/08/2021    8:09 AM 08/13/2021  6:31 AM 12/31/2022    2:58 PM 02/05/2023    1:15 PM  Fall Risk  Falls in the past year?    1 1  Was there an injury with Fall?     0  Fall Risk Category Calculator     2  (RETIRED) Patient Fall Risk Level High fall risk High fall risk Moderate fall risk    Patient at Risk for Falls Due to    History of fall(s);Impaired balance/gait History of fall(s);Impaired balance/gait   Functional Status Survey:    Vitals:   12/16/23 1058  BP: 138/76  Pulse: 80  Resp: 18  Temp: 97.9 F (36.6 C)  SpO2: 98%  Weight: 129 lb 6.4 oz (58.7 kg)  Height: 5\' 6"  (1.676 m)   Body mass index is 20.89 kg/m. Physical Exam Constitutional:      General: She is not in acute distress.    Appearance: She is well-developed. She is not diaphoretic.  HENT:     Head: Normocephalic and atraumatic.     Mouth/Throat:     Pharynx: No oropharyngeal exudate.  Eyes:     Conjunctiva/sclera: Conjunctivae normal.     Pupils: Pupils are equal, round, and reactive to light.  Cardiovascular:     Rate and Rhythm: Normal rate and regular  rhythm.     Heart sounds: Normal heart sounds.  Pulmonary:     Effort: Pulmonary effort is normal.     Breath sounds: Normal breath sounds.  Abdominal:     General: Bowel sounds are normal.     Palpations: Abdomen is soft.  Musculoskeletal:     Cervical back: Normal range of motion and neck supple.     Right lower leg: No edema.     Left lower leg: No edema.  Skin:    General: Skin is warm and dry.  Neurological:     Mental Status: She is alert.     Motor: Weakness present.     Gait: Gait abnormal.  Psychiatric:        Mood and Affect: Mood normal.     Labs reviewed: Recent Labs    08/16/23 0000 11/11/23 0000 12/03/23 0000  NA 135* 138 137  K 4.5 4.1 3.9  CL 104 103 103  CO2 24* 25* 25*  BUN 23* 28* 22*  CREATININE 0.7 0.7 0.8  CALCIUM 9.0 8.8 8.9   Recent Labs    05/06/23 0000 08/16/23 0000 11/11/23 0000 12/03/23 0000  AST 20 16 22 19   ALT 15 7 11 10   ALKPHOS 95 94 90  --   ALBUMIN 4.2 3.6 3.6 3.6   Recent Labs    12/31/22 0000 08/16/23 0000 11/11/23 0000 12/03/23 0000  WBC 8.8 7.8 12.1 9.5  NEUTROABS 3,881.00 2,215.00  --  3,838.00  HGB 12.6 12.4 11.8* 12.6  HCT 37 38 36 38  PLT 280 211 236 288   Lab Results  Component Value Date   TSH 1.27 02/06/2013   No results found for: "HGBA1C" Lab Results  Component Value Date   CHOL 232 (H) 02/06/2013   HDL 77.50 02/06/2013   LDLDIRECT 138.4 02/06/2013   TRIG 72.0 02/06/2013   CHOLHDL 3 02/06/2013    Significant Diagnostic Results in last 30 days:  No results found.  Assessment/Plan Allergic rhinitis due to pollen Stable, continues to xyzal .   Chronic constipation Well controlled on current regimen, continue to monitor and adjust as needed.   Chronic diastolic heart failure (HCC) Euvolemic at this time,  continue to monitor.   Dementia (HCC) Stable, no acute changes in cognitive or functional status, continue supportive care.   Malnutrition of mild degree (HCC) Significant weight loss  since fall however now eating better and feeing herself. Continues on Remeron and support from staff.   Peripheral venous insufficiency Stable, continue with compression hose and elevation as able.   Atherosclerosis of aorta (HCC) Continues on ASA      Cherith Tewell K. Denney Fisherman [facility] [phone]

## 2023-12-16 NOTE — Assessment & Plan Note (Signed)
 Significant weight loss since fall however now eating better and feeing herself. Continues on Remeron and support from staff.

## 2023-12-16 NOTE — Assessment & Plan Note (Signed)
 Well controlled on current regimen, continue to monitor and adjust as needed.

## 2024-01-13 ENCOUNTER — Encounter: Payer: Self-pay | Admitting: Nurse Practitioner

## 2024-01-13 ENCOUNTER — Non-Acute Institutional Stay (SKILLED_NURSING_FACILITY): Payer: Self-pay | Admitting: Nurse Practitioner

## 2024-01-13 DIAGNOSIS — Z0000 Encounter for general adult medical examination without abnormal findings: Secondary | ICD-10-CM | POA: Diagnosis not present

## 2024-01-13 NOTE — Progress Notes (Signed)
 Subjective:   [name] [name] is a 89 y.o. female who presents for Medicare Annual (Subsequent) preventive examination.  Visit Complete: In person at [facility]    Cardiac Risk Factors include: advanced age (>76men, >69 women);sedentary lifestyle     Objective:    Today's Vitals   01/13/24 1317  BP: 138/78  Pulse: 84  Resp: 20  Temp: 97.9 F (36.6 C)  SpO2: 94%  Weight: 139 lb 12.8 oz (63.4 kg)  Height: 5' 6 (1.676 m)   Body mass index is 22.56 kg/m.     01/13/2024    1:18 PM 10/22/2023    2:01 PM 08/12/2023    9:52 AM 04/16/2023    9:11 AM 02/05/2023   10:44 AM 12/31/2022    3:12 PM 12/29/2022    3:05 PM  Advanced Directives  Does Patient Have a Medical Advance Directive? Yes Yes Yes Yes Yes Yes Yes  Type of Advance Directive Out of facility DNR (pink MOST or yellow form) Out of facility DNR (pink MOST or yellow form) Out of facility DNR (pink MOST or yellow form) Out of facility DNR (pink MOST or yellow form) Out of facility DNR (pink MOST or yellow form) Out of facility DNR (pink MOST or yellow form) Out of facility DNR (pink MOST or yellow form)  Does patient want to make changes to medical advance directive? No - Patient declined No - Patient declined No - Patient declined No - Patient declined No - Patient declined No - Patient declined No - Patient declined  Pre-existing out of facility DNR order (yellow form or pink MOST form) Pink MOST/Yellow Form most recent copy in chart - Physician notified to receive inpatient order    Yellow form placed in chart (order not valid for inpatient use) Yellow form placed in chart (order not valid for inpatient use)     Current Medications (verified) Outpatient Encounter Medications as of 01/13/2024  Medication Sig   acetaminophen  (TYLENOL ) 500 MG tablet Take 500 mg by mouth 3 (three) times daily. Give one tablet by mouth every 8 hours as needed for pain.   aspirin  EC 81 MG tablet Take 81 mg by mouth daily. Swallow whole.    brimonidine (ALPHAGAN) 0.2 % ophthalmic solution Place 1 drop into both eyes 2 (two) times daily.   diclofenac Sodium (VOLTAREN) 1 % GEL Apply 4 g topically 3 (three) times daily. To left knee see other listing for shoulder use   diclofenac Sodium (VOLTAREN) 1 % GEL Apply 2 g topically every 8 (eight) hours as needed (to right shoulder).   dorzolamide -timolol  (COSOPT ) 22.3-6.8 MG/ML ophthalmic solution Apply 1 drop to eye 2 (two) times daily.   EPIPEN  2-PAK 0.3 MG/0.3ML SOAJ injection Inject into the muscle as needed for anaphylaxis (or allergic reaction).   fluticasone  (FLONASE ) 50 MCG/ACT nasal spray Place 1 spray into both nostrils 2 (two) times a day.   latanoprost  (XALATAN ) 0.005 % ophthalmic solution Place 1 drop into both eyes at bedtime.   levocetirizine (XYZAL ) 5 MG tablet Take 5 mg by mouth every evening.   melatonin 3 MG TABS tablet Take 3 mg by mouth at bedtime.   mirtazapine (REMERON) 15 MG tablet Take 15 mg by mouth at bedtime.   Multiple Vitamin (MULTIVITAMIN) tablet Take 1 tablet by mouth daily.   Nutritional Supplements (ENSURE CLEAR) LIQD Take 1 Container by mouth with breakfast, with lunch, and with evening meal.   polyethylene glycol (MIRALAX  / GLYCOLAX ) 17 g packet Take 17 g  by mouth daily.   Probiotic Product (PROBIOTIC ADVANCED PO) Take 1 capsule by mouth daily.   Simethicone  (GAS-X PO) Take by mouth 2 (two) times daily.   traMADol  (ULTRAM ) 50 MG tablet Take 1 tablet (50 mg total) by mouth every 8 (eight) hours as needed. Give one tablet by mouth at bedtime   OXYGEN Inhale into the lungs. 2lpm (Patient not taking: Reported on 01/13/2024)   No facility-administered encounter medications on file as of 01/13/2024.    Allergies (verified) Cat dander, Demerol [meperidine], Dust mite extract, Shellfish allergy, and Avelox  [moxifloxacin  hcl in nacl]   History: Past Medical History:  Diagnosis Date   Allergic rhinitis due to pollen    some shellfish also   Aortic sclerosis  5/12   On echo. no stenosis   Arthritis    Diastolic dysfunction    stress echo otherwise normal 1/04. Repeat 5/11 also negative   History of seasonal allergies    Hx of basal cell carcinoma 1990   multiple sites   Hx of squamous cell carcinoma of skin 2000   R nasolabial fold   Hyperlipidemia    Osteoarthritis, multiple sites    Osteoporosis    Overactive bladder    Urge incontinence    Vaginal odor 04/2018   aerobic vaginitis on One Swab culture   Past Surgical History:  Procedure Laterality Date   CYSTOCELE REPAIR  2007   and rectocele   DILATION AND CURETTAGE OF UTERUS     INGUINAL HERNIA REPAIR  2012   left side with mesh   REVERSE SHOULDER ARTHROPLASTY Right 04/27/2017   Procedure: REVERSE SHOULDER ARTHROPLASTY;  Surgeon: Elner Hahn, MD;  Location: [facility] ORS;  Service: Orthopedics;  Laterality: Right;   TONSILLECTOMY AND ADENOIDECTOMY     as child   TOTAL HIP ARTHROPLASTY Right 05/23/2015   Procedure: TOTAL HIP ARTHROPLASTY ANTERIOR APPROACH;  Surgeon: Molli Angelucci, MD;  Location: [facility] ORS;  Service: Orthopedics;  Laterality: Right;   UMBILICAL HERNIA REPAIR  02/04/06   Family History  Problem Relation Age of Onset   Cancer Father 57       colon cancer   Heart disease Father    Social History   Socioeconomic History   Marital status: Widowed    Spouse name: Not on file   Number of children: 3   Years of education: Not on file   Highest education level: Not on file  Occupational History   Occupation: Runner, broadcasting/film/video    Comment: short time   Occupation: Systems developer    Comment: most of career  Tobacco Use   Smoking status: Former   Smokeless tobacco: Never  Advertising account planner   Vaping status: Never Used  Substance and Sexual Activity   Alcohol use: Yes    Comment: wine before dinner   Drug use: No   Sexual activity: Not Currently    Birth control/protection: Post-menopausal  Other Topics Concern   Not on file  Social History Narrative   Widowed 2019   Has  living will   Daughter Climmie Damme, is health care POA   Would accept trial of resuscitation   Would probably accept feeding tube   Social Drivers of Health   Financial Resource Strain: Not on file  Food Insecurity: Not on file  Transportation Needs: Not on file  Physical Activity: Not on file  Stress: Not on file  Social Connections: Not on file    Tobacco Counseling Counseling given: Not Answered   Clinical Intake:  Pre-visit  preparation completed: Yes  Pain : No/denies pain     BMI - recorded: 22 Nutritional Status: BMI of 19-24  Normal Nutritional Risks: Other (Comment) (dementia)  How often do you need to have someone help you when you read instructions, pamphlets, or other written materials from your doctor or pharmacy?: 5 - Always         Activities of Daily Living    01/13/2024    2:14 PM  In your present state of health, do you have any difficulty performing the following activities:  Hearing? 1  Vision? 0  Difficulty concentrating or making decisions? 1  Walking or climbing stairs? 1  Dressing or bathing? 1  Doing errands, shopping? 1  Preparing Food and eating ? Y  Using the Toilet? Y  In the past six months, have you accidently leaked urine? Y  Do you have problems with loss of bowel control? N  Managing your Medications? Y  Managing your Finances? Y  Housekeeping or managing your Housekeeping? Y    Patient Care Team: Valrie Gehrig, MD as PCP - General (Family Medicine)  Indicate any recent Medical Services you may have received from other than [facility] providers in the past year (date may be approximate).     Assessment:   This is a routine wellness examination for [name] [name].  Hearing/Vision screen No results found.   Goals Addressed   None    Depression Screen    01/13/2024    2:16 PM 03/07/2018    3:45 PM 03/01/2017    3:55 PM 02/13/2015   10:08 AM 02/07/2014   10:15 AM 02/06/2013   10:49 AM  PHQ 2/9 Scores  PHQ - 2 Score  1 0 0 0 0   Exception Documentation Other- indicate reason in comment box       Not completed dementia         Fall Risk    01/13/2024    2:16 PM 02/05/2023    1:15 PM 12/31/2022    2:58 PM 06/28/2019   10:00 AM 03/07/2018    3:45 PM  Fall Risk   Falls in the past year? 1 1 1 1   Yes   Comment    Emmi Telephone Survey: data to providers prior to load    Number falls in past yr: 1 1 1 1  2  or more   Comment    Emmi Telephone Survey Actual Response = 1    Injury with Fall? 1 0  1 No   Risk Factor Category      High Fall Risk   Risk for fall due to : History of fall(s);Impaired balance/gait History of fall(s);Impaired balance/gait History of fall(s);Impaired balance/gait    Follow up Falls evaluation completed    Falls prevention discussed      Data saved with a previous flowsheet row definition    MEDICARE RISK AT HOME: Medicare Risk at Home Any stairs in or around the home?: No Home free of loose throw rugs in walkways, pet beds, electrical cords, etc?: Yes Adequate lighting in your home to reduce risk of falls?: Yes Life alert?: No Use of a cane, walker or w/c?: Yes Grab bars in the bathroom?: Yes Shower chair or bench in shower?: Yes Elevated toilet seat or a handicapped toilet?: Yes  TIMED UP AND GO:  Was the test performed?  No    Cognitive Function:        Immunizations Immunization History  Administered Date(s) Administered   Influenza, High Dose  Seasonal PF 04/28/2017, 05/16/2018, 05/19/2022   Influenza, Seasonal, Injecte, Preservative Fre 05/28/2016   Influenza,inj,Quad PF,6+ Mos 05/25/2013, 05/07/2015   Influenza-Unspecified 04/24/2014, 05/20/2021, 05/26/2023   Moderna Covid-19 Fall Seasonal Vaccine 72yrs & older 11/10/2022   Moderna SARS-COV2 Booster Vaccination 06/14/2020, 12/20/2020, 12/30/2021   Moderna Sars-Covid-2 Vaccination 08/14/2019, 09/11/2019, 06/14/2020, 04/25/2021, 06/12/2022   Pfizer Covid-19 Vaccine Bivalent Booster 51yrs & up 04/25/2021   Pneumococcal  Conjugate-13 02/07/2014   Pneumococcal Polysaccharide-23 02/15/2012, 03/07/2018   Td 09/12/2002, 02/06/2013   Unspecified SARS-COV-2 Vaccination 04/30/2023, 11/26/2023   Zoster, Live 02/19/2016    TDAP status: Due, Education has been provided regarding the importance of this vaccine. Advised may receive this vaccine at local pharmacy or Health Dept. Aware to provide a copy of the vaccination record if obtained from local pharmacy or Health Dept. Verbalized acceptance and understanding.  Flu Vaccine status: Up to date  Pneumococcal vaccine status: Up to date  Covid-19 vaccine status: Information provided on how to obtain vaccines.   Qualifies for Shingles Vaccine? Yes   Zostavax completed No   Shingrix Completed?: Yes  Screening Tests Health Maintenance  Topic Date Due   DTaP/Tdap/Td (3 - Tdap) 10/21/2024 (Originally 02/07/2023)   INFLUENZA VACCINE  03/03/2024   Pneumococcal Vaccine: 50+ Years  Completed   DEXA SCAN  Completed   COVID-19 Vaccine  Completed   HPV VACCINES  Aged Out   Meningococcal B Vaccine  Aged Out   Zoster Vaccines- Shingrix  Discontinued    Health Maintenance  There are no preventive care reminders to display for this patient.  Colorectal cancer screening: No longer required.   Mammogram status: No longer required due to age.   Lung Cancer Screening: (Low Dose CT Chest recommended if Age 53-80 years, 20 pack-year currently smoking OR have quit w/in 15years.) does not qualify.   Lung Cancer Screening Referral: na  Additional Screening:  Hepatitis C Screening: does not qualify; Completed  Vision Screening: Recommended annual ophthalmology exams for early detection of glaucoma and other disorders of the eye. Is the patient up to date with their annual eye exam?  Yes  Who is the provider or what is the name of the office in which the patient attends annual eye exams? Onsite eye If pt is not established with a provider, would they like to be referred to  a provider to establish care? No .   Dental Screening: Recommended annual dental exams for proper oral hygiene   Community Resource Referral / Chronic Care Management: CRR required this visit?  No   CCM required this visit?  No     Plan:     I have personally reviewed and noted the following in the patient's chart:   Medical and social history Use of alcohol, tobacco or illicit drugs  Current medications and supplements including opioid prescriptions. Patient is currently taking opioid prescriptions. Information provided to patient regarding non-opioid alternatives. Patient advised to discuss non-opioid treatment plan with their provider. Functional ability and status Nutritional status Physical activity Advanced directives List of other physicians Hospitalizations, surgeries, and ER visits in previous 12 months Vitals Screenings to include cognitive, depression, and falls Referrals and appointments  In addition, I have reviewed and discussed with patient certain preventive protocols, quality metrics, and best practice recommendations. A written personalized care plan for preventive services as well as general preventive health recommendations were provided to patient.     Verma Gobble, NP   01/13/2024

## 2024-01-13 NOTE — Patient Instructions (Signed)
  Ms. Deriso , Thank you for taking time to come for your Medicare Wellness Visit. I appreciate your ongoing commitment to your health goals. Please review the following plan we discussed and let me know if I can assist you in the future.  This is a list of the screening recommended for you and due dates:  Health Maintenance  Topic Date Due   DTaP/Tdap/Td vaccine (3 - Tdap) 10/21/2024*   Flu Shot  03/03/2024   Pneumococcal Vaccine for age over 19  Completed   DEXA scan (bone density measurement)  Completed   COVID-19 Vaccine  Completed   HPV Vaccine  Aged Out   Meningitis B Vaccine  Aged Out   Zoster (Shingles) Vaccine  Discontinued  *Topic was postponed. The date shown is not the original due date.

## 2024-02-28 ENCOUNTER — Non-Acute Institutional Stay (SKILLED_NURSING_FACILITY): Payer: Self-pay | Admitting: Student

## 2024-02-28 ENCOUNTER — Encounter: Payer: Self-pay | Admitting: Student

## 2024-02-28 DIAGNOSIS — E441 Mild protein-calorie malnutrition: Secondary | ICD-10-CM | POA: Diagnosis not present

## 2024-02-28 DIAGNOSIS — K5909 Other constipation: Secondary | ICD-10-CM

## 2024-02-28 DIAGNOSIS — I5032 Chronic diastolic (congestive) heart failure: Secondary | ICD-10-CM

## 2024-02-28 DIAGNOSIS — I700 Atherosclerosis of aorta: Secondary | ICD-10-CM

## 2024-02-28 DIAGNOSIS — M1712 Unilateral primary osteoarthritis, left knee: Secondary | ICD-10-CM

## 2024-02-28 DIAGNOSIS — F03B3 Unspecified dementia, moderate, with mood disturbance: Secondary | ICD-10-CM | POA: Diagnosis not present

## 2024-02-28 DIAGNOSIS — N3941 Urge incontinence: Secondary | ICD-10-CM

## 2024-02-28 MED ORDER — TRAMADOL HCL 50 MG PO TABS: 50.0000 mg | ORAL_TABLET | Freq: Three times a day (TID) | ORAL | 0 refills | Status: AC | PRN

## 2024-02-28 NOTE — Progress Notes (Signed)
 Location:      Place of Service:    Provider:  Abdul Abdul Fine, MD  Patient Care Team: Abdul Fine, MD as PCP - General [facility] Medicine)  Extended Emergency Contact Information Primary Emergency Contact: [name],[name]  [place] Home Phone: [phone] Relation: Daughter  Code Status:  DNR Goals of care: Advanced Directive information    01/13/2024    1:18 PM  Advanced Directives  Does Patient Have a Medical Advance Directive? Yes  Type of Advance Directive Out of facility DNR (pink MOST or yellow form)  Does patient want to make changes to medical advance directive? No - Patient declined  Pre-existing out of facility DNR order (yellow form or pink MOST form) Pink MOST/Yellow Form most recent copy in chart - Physician notified to receive inpatient order     No chief complaint on file.   HPI:  Pt is a 89 y.o. female seen today for an routine visit for  Discussed the use of AI scribe software for clinical note transcription with the patient, who gave verbal consent to proceed.  History of Present Illness   History of Present Illness The patient is a 89 year old who presents with memory concerns and recent diarrhea.  She is confused about her husband's passing in 2019 and has difficulty remembering this when reminded by her children. She called her daughter at 63 AM. She is concerned about finances and catching the bus. She is anxious to contact her husband, [name] [name], regarding her insurance agency. 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 She has difficulty remembering her age and the current date.  She reports feeling unwell recently but felt better today until she experienced diarrhea. She had diarrhea this morning after starting to do a few things. There is no mention of any prior episodes or treatments for diarrhea. She confirms having diarrhea  today.  She resides at [facility] and has been retired for a long time. 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 She reports eating well, although she feels she is being pushed to eat more than necessary. No significant weight gain. She usually sleeps well but was unable to sleep last night due to anticipation of a group dance event.  Social History - Employment: Teacher, music (Retired) - Partner Status: Widowed - Living Situation: Resides at [facility] retirement community - The patient is 89 years old and has a daughter named [name] [name]. 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  Past Medical History:  Diagnosis Date   Allergic rhinitis due to pollen    some shellfish also   Aortic sclerosis 5/12   On echo. no stenosis   Arthritis    Diastolic dysfunction    stress echo otherwise normal 1/04. Repeat 5/11 also negative   History of seasonal allergies    Hx of basal cell carcinoma 1990   multiple sites   Hx of squamous cell carcinoma of skin 2000   R nasolabial fold   Hyperlipidemia    Osteoarthritis, multiple sites    Osteoporosis    Overactive bladder    Urge incontinence    Vaginal odor 04/2018   aerobic vaginitis on One Swab culture   Past Surgical History:  Procedure Laterality Date   CYSTOCELE REPAIR  2007   and rectocele  DILATION AND CURETTAGE OF UTERUS     INGUINAL HERNIA REPAIR  2012   left side with mesh   REVERSE SHOULDER ARTHROPLASTY Right 04/27/2017   Procedure: REVERSE SHOULDER ARTHROPLASTY;  Surgeon: Edie Norleen PARAS, MD;  Location: [facility] ORS;  Service: Orthopedics;  Laterality: Right;   TONSILLECTOMY AND ADENOIDECTOMY     as child   TOTAL HIP ARTHROPLASTY Right 05/23/2015   Procedure: TOTAL HIP ARTHROPLASTY ANTERIOR APPROACH;  Surgeon: Ozell Flake, MD;  Location: [facility] ORS;  Service: Orthopedics;  Laterality: Right;   UMBILICAL HERNIA REPAIR   02/04/06    Allergies  Allergen Reactions   Cat Dander    Demerol [Meperidine]     dizzy   Dust Mite Extract    Shellfish Allergy    Avelox  [Moxifloxacin  Hcl In Nacl] Rash    Outpatient Encounter Medications as of 02/28/2024  Medication Sig   acetaminophen  (TYLENOL ) 500 MG tablet Take 500 mg by mouth 3 (three) times daily. Give one tablet by mouth every 8 hours as needed for pain.   aspirin  EC 81 MG tablet Take 81 mg by mouth daily. Swallow whole.   brimonidine (ALPHAGAN) 0.2 % ophthalmic solution Place 1 drop into both eyes 2 (two) times daily.   diclofenac Sodium (VOLTAREN) 1 % GEL Apply 4 g topically 3 (three) times daily. To left knee see other listing for shoulder use   diclofenac Sodium (VOLTAREN) 1 % GEL Apply 2 g topically every 8 (eight) hours as needed (to right shoulder).   dorzolamide -timolol  (COSOPT ) 22.3-6.8 MG/ML ophthalmic solution Apply 1 drop to eye 2 (two) times daily.   EPIPEN  2-PAK 0.3 MG/0.3ML SOAJ injection Inject into the muscle as needed for anaphylaxis (or allergic reaction).   fluticasone  (FLONASE ) 50 MCG/ACT nasal spray Place 1 spray into both nostrils 2 (two) times a day.   latanoprost  (XALATAN ) 0.005 % ophthalmic solution Place 1 drop into both eyes at bedtime.   levocetirizine (XYZAL ) 5 MG tablet Take 5 mg by mouth every evening.   melatonin 3 MG TABS tablet Take 3 mg by mouth at bedtime.   mirtazapine (REMERON) 15 MG tablet Take 15 mg by mouth at bedtime.   Multiple Vitamin (MULTIVITAMIN) tablet Take 1 tablet by mouth daily.   Nutritional Supplements (ENSURE CLEAR) LIQD Take 1 Container by mouth with breakfast, with lunch, and with evening meal.   OXYGEN Inhale into the lungs. 2lpm (Patient not taking: Reported on 01/13/2024)   polyethylene glycol (MIRALAX  / GLYCOLAX ) 17 g packet Take 17 g by mouth daily.   Probiotic Product (PROBIOTIC ADVANCED PO) Take 1 capsule by mouth daily.   Simethicone  (GAS-X PO) Take by mouth 2 (two) times daily.   traMADol   (ULTRAM ) 50 MG tablet Take 1 tablet (50 mg total) by mouth every 8 (eight) hours as needed. Give one tablet by mouth at bedtime   No facility-administered encounter medications on file as of 02/28/2024.    Review of Systems  Immunization History  Administered Date(s) Administered   Influenza, High Dose Seasonal PF 04/28/2017, 05/16/2018, 05/19/2022   Influenza, Seasonal, Injecte, Preservative Fre 05/28/2016   Influenza,inj,Quad PF,6+ Mos 05/25/2013, 05/07/2015   Influenza-Unspecified 04/24/2014, 05/20/2021, 05/26/2023   Moderna Covid-19 Fall Seasonal Vaccine 27yrs & older 11/10/2022   Moderna SARS-COV2 Booster Vaccination 06/14/2020, 12/20/2020, 12/30/2021   Moderna Sars-Covid-2 Vaccination 08/14/2019, 09/11/2019, 06/14/2020, 04/25/2021, 06/12/2022   Pfizer Covid-19 Vaccine Bivalent Booster 22yrs & up 04/25/2021   Pneumococcal Conjugate-13 02/07/2014   Pneumococcal Polysaccharide-23 02/15/2012, 03/07/2018   Td 09/12/2002, 02/06/2013  Unspecified SARS-COV-2 Vaccination 04/30/2023, 11/26/2023   Zoster, Live 02/19/2016   Pertinent  Health Maintenance Due  Topic Date Due   INFLUENZA VACCINE  03/03/2024   DEXA SCAN  Completed      07/08/2021    8:09 AM 08/13/2021    6:31 AM 12/31/2022    2:58 PM 02/05/2023    1:15 PM 01/13/2024    2:16 PM  Fall Risk  Falls in the past year?   1 1 1   Was there an injury with Fall?    0 1  Fall Risk Category Calculator    2 3  (RETIRED) Patient Fall Risk Level High fall risk  Moderate fall risk      Patient at Risk for Falls Due to   History of fall(s);Impaired balance/gait History of fall(s);Impaired balance/gait History of fall(s);Impaired balance/gait  Fall risk Follow up     Falls evaluation completed     Data saved with a previous flowsheet row definition   Functional Status Survey:    There were no vitals filed for this visit. There is no height or weight on file to calculate BMI. Physical Exam  Labs reviewed: Recent Labs     08/16/23 0000 11/11/23 0000 12/03/23 0000  NA 135* 138 137  K 4.5 4.1 3.9  CL 104 103 103  CO2 24* 25* 25*  BUN 23* 28* 22*  CREATININE 0.7 0.7 0.8  CALCIUM 9.0 8.8 8.9   Recent Labs    05/06/23 0000 08/16/23 0000 11/11/23 0000 12/03/23 0000  AST 20 16 22 19   ALT 15 7 11 10   ALKPHOS 95 94 90  --   ALBUMIN 4.2 3.6 3.6 3.6   Recent Labs    08/16/23 0000 11/11/23 0000 12/03/23 0000  WBC 7.8 12.1 9.5  NEUTROABS 2,215.00  --  3,838.00  HGB 12.4 11.8* 12.6  HCT 38 36 38  PLT 211 236 288   Lab Results  Component Value Date   TSH 1.27 02/06/2013   No results found for: HGBA1C Lab Results  Component Value Date   CHOL 232 (H) 02/06/2013   HDL 77.50 02/06/2013   LDLDIRECT 138.4 02/06/2013   TRIG 72.0 02/06/2013   CHOLHDL 3 02/06/2013    Significant Diagnostic Results in last 30 days:  No results found.  Assessment/Plan Protein Deficiency Dementia Difficulty recalling significant personal events, such as the passing of her husband. Repeatedly inquires about deceased husband and expresses confusion about current location and time. Weight appears stable at this time. Continue Mirtazapine. Reorient often. Melatonin nightly to aid with sleep-wake cycle.   CHF Appears euvolemic on exam. Continue supporitve care   Chronic knee pain Refill tramadol .   Urge Incontinence No issues at this time. Periodic efforts to do so independently  leading to increased fall risk.  Diarrhea Acute episode of diarrhea reported today.  Nurses to continue to monitor  Family/ staff Communication: nursing  Labs/tests ordered:  none

## 2024-03-06 ENCOUNTER — Non-Acute Institutional Stay (SKILLED_NURSING_FACILITY): Payer: Self-pay | Admitting: Student

## 2024-03-06 ENCOUNTER — Encounter: Payer: Self-pay | Admitting: Student

## 2024-03-06 DIAGNOSIS — H409 Unspecified glaucoma: Secondary | ICD-10-CM

## 2024-03-06 NOTE — Progress Notes (Unsigned)
 Location:  Other [facility] Nursing Home Room Number: 202 A Place of Service:  SNF (31) Provider:  Abdul Fine, MD  Patient Care Team: Abdul Fine, MD as PCP - General (Family Medicine)  Extended Emergency Contact Information Primary Emergency Contact: Dibens,Marcia  [place] Home Phone: [phone] Relation: Daughter  Code Status:  DNR Goals of care: Advanced Directive information    01/13/2024    1:18 PM  Advanced Directives  Does Patient Have a Medical Advance Directive? Yes  Type of Advance Directive Out of facility DNR (pink MOST or yellow form)  Does patient want to make changes to medical advance directive? No - Patient declined  Pre-existing out of facility DNR order (yellow form or pink MOST form) Pink MOST/Yellow Form most recent copy in chart - Physician notified to receive inpatient order     Chief Complaint  Patient presents with   Eye Pain    HPI:  Pt is a 89 y.o. female seen today for an acute visit for    Past Medical History:  Diagnosis Date   Allergic rhinitis due to pollen    some shellfish also   Aortic sclerosis 5/12   On echo. no stenosis   Arthritis    Diastolic dysfunction    stress echo otherwise normal 1/04. Repeat 5/11 also negative   History of seasonal allergies    Hx of basal cell carcinoma 1990   multiple sites   Hx of squamous cell carcinoma of skin 2000   R nasolabial fold   Hyperlipidemia    Osteoarthritis, multiple sites    Osteoporosis    Overactive bladder    Urge incontinence    Vaginal odor 04/2018   aerobic vaginitis on One Swab culture   Past Surgical History:  Procedure Laterality Date   CYSTOCELE REPAIR  2007   and rectocele   DILATION AND CURETTAGE OF UTERUS     INGUINAL HERNIA REPAIR  2012   left side with mesh   REVERSE SHOULDER ARTHROPLASTY Right 04/27/2017   Procedure: REVERSE SHOULDER ARTHROPLASTY;  Surgeon: Edie Norleen PARAS, MD;  Location: [facility] ORS;  Service: Orthopedics;   Laterality: Right;   TONSILLECTOMY AND ADENOIDECTOMY     as child   TOTAL HIP ARTHROPLASTY Right 05/23/2015   Procedure: TOTAL HIP ARTHROPLASTY ANTERIOR APPROACH;  Surgeon: Ozell Flake, MD;  Location: [facility] ORS;  Service: Orthopedics;  Laterality: Right;   UMBILICAL HERNIA REPAIR  02/04/06    Allergies  Allergen Reactions   Cat Dander    Demerol [Meperidine]     dizzy   Dust Mite Extract    Shellfish Allergy    Avelox  [Moxifloxacin  Hcl In Nacl] Rash    Outpatient Encounter Medications as of 03/06/2024  Medication Sig   acetaminophen  (TYLENOL ) 500 MG tablet Take 500 mg by mouth 3 (three) times daily. Scheduled and Give one tablet by mouth every 8 hours as needed for pain.   aspirin  EC 81 MG tablet Take 81 mg by mouth daily. Swallow whole.   brimonidine (ALPHAGAN) 0.2 % ophthalmic solution Place 1 drop into both eyes 2 (two) times daily.   diclofenac Sodium (VOLTAREN) 1 % GEL Apply 4 g topically 3 (three) times daily. To left knee see other listing for shoulder use   diclofenac Sodium (VOLTAREN) 1 % GEL Apply 2 g topically every 8 (eight) hours as needed (to right shoulder).   dorzolamide -timolol  (COSOPT ) 22.3-6.8 MG/ML ophthalmic solution Apply 1 drop to eye 2 (two) times daily.   EPIPEN  2-PAK  0.3 MG/0.3ML SOAJ injection Inject into the muscle as needed for anaphylaxis (or allergic reaction).   fluticasone  (FLONASE ) 50 MCG/ACT nasal spray Place 1 spray into both nostrils 2 (two) times a day.   latanoprost  (XALATAN ) 0.005 % ophthalmic solution Place 1 drop into both eyes at bedtime.   levocetirizine (XYZAL ) 5 MG tablet Take 5 mg by mouth every evening.   melatonin 3 MG TABS tablet Take 3 mg by mouth at bedtime.   mirtazapine (REMERON) 15 MG tablet Take 15 mg by mouth at bedtime.   Multiple Vitamin (MULTIVITAMIN) tablet Take 1 tablet by mouth daily.   Nutritional Supplements (ENSURE CLEAR) LIQD Take 1 Container by mouth 2 (two) times daily.   polyethylene glycol (MIRALAX  / GLYCOLAX ) 17 g  packet Take 17 g by mouth daily.   Probiotic Product (PROBIOTIC ADVANCED PO) Take 1 capsule by mouth daily.   Simethicone  (GAS-X PO) Take by mouth 2 (two) times daily.   traMADol  (ULTRAM ) 50 MG tablet Take 1 tablet (50 mg total) by mouth every 8 (eight) hours as needed. Give one tablet by mouth at bedtime   traMADol  (ULTRAM ) 50 MG tablet Take 50 mg by mouth at bedtime. See as needed dosing also   No facility-administered encounter medications on file as of 03/06/2024.    Review of Systems  Immunization History  Administered Date(s) Administered   Influenza, High Dose Seasonal PF 04/28/2017, 05/16/2018, 05/19/2022   Influenza, Seasonal, Injecte, Preservative Fre 05/28/2016   Influenza,inj,Quad PF,6+ Mos 05/25/2013, 05/07/2015   Influenza-Unspecified 04/24/2014, 05/20/2021, 05/26/2023   Moderna Covid-19 Fall Seasonal Vaccine 71yrs & older 11/10/2022   Moderna SARS-COV2 Booster Vaccination 06/14/2020, 12/20/2020, 12/30/2021   Moderna Sars-Covid-2 Vaccination 08/14/2019, 09/11/2019, 06/14/2020, 04/25/2021, 06/12/2022   Pfizer Covid-19 Vaccine Bivalent Booster 60yrs & up 04/25/2021   Pneumococcal Conjugate-13 02/07/2014   Pneumococcal Polysaccharide-23 02/15/2012, 03/07/2018   Td 09/12/2002, 02/06/2013   Unspecified SARS-COV-2 Vaccination 04/30/2023, 11/26/2023   Zoster, Live 02/19/2016   Pertinent  Health Maintenance Due  Topic Date Due   INFLUENZA VACCINE  03/03/2024   DEXA SCAN  Completed      07/08/2021    8:09 AM 08/13/2021    6:31 AM 12/31/2022    2:58 PM 02/05/2023    1:15 PM 01/13/2024    2:16 PM  Fall Risk  Falls in the past year?   1 1 1   Was there an injury with Fall?    0 1  Fall Risk Category Calculator    2 3  (RETIRED) Patient Fall Risk Level High fall risk  Moderate fall risk      Patient at Risk for Falls Due to   History of fall(s);Impaired balance/gait History of fall(s);Impaired balance/gait History of fall(s);Impaired balance/gait  Fall risk Follow up     Falls  evaluation completed     Data saved with a previous flowsheet row definition   Functional Status Survey:    Vitals:   03/06/24 1032  BP: (!) 160/69  Pulse: 68  Resp: 18  Temp: 98 F (36.7 C)  SpO2: 94%  Weight: 137 lb 6.4 oz (62.3 kg)  Height: 5' 6 (1.676 m)   Body mass index is 22.18 kg/m. Physical Exam  Labs reviewed: Recent Labs    08/16/23 0000 11/11/23 0000 12/03/23 0000  NA 135* 138 137  K 4.5 4.1 3.9  CL 104 103 103  CO2 24* 25* 25*  BUN 23* 28* 22*  CREATININE 0.7 0.7 0.8  CALCIUM 9.0 8.8 8.9   Recent Labs  05/06/23 0000 08/16/23 0000 11/11/23 0000 12/03/23 0000  AST 20 16 22 19   ALT 15 7 11 10   ALKPHOS 95 94 90  --   ALBUMIN 4.2 3.6 3.6 3.6   Recent Labs    08/16/23 0000 11/11/23 0000 12/03/23 0000  WBC 7.8 12.1 9.5  NEUTROABS 2,215.00  --  3,838.00  HGB 12.4 11.8* 12.6  HCT 38 36 38  PLT 211 236 288   Lab Results  Component Value Date   TSH 1.27 02/06/2013   No results found for: HGBA1C Lab Results  Component Value Date   CHOL 232 (H) 02/06/2013   HDL 77.50 02/06/2013   LDLDIRECT 138.4 02/06/2013   TRIG 72.0 02/06/2013   CHOLHDL 3 02/06/2013    Significant Diagnostic Results in last 30 days:  No results found.  Assessment/Plan There are no diagnoses linked to this encounter.   Family/ staff Communication: ***  Labs/tests ordered:  ***

## 2024-03-07 ENCOUNTER — Encounter: Payer: Self-pay | Admitting: Student

## 2024-04-10 ENCOUNTER — Emergency Department
Admission: EM | Admit: 2024-04-10 | Discharge: 2024-04-10 | Disposition: A | Discharge status: Skilled Nursing Facility | Source: Skilled Nursing Facility | Attending: Emergency Medicine | Admitting: Emergency Medicine

## 2024-04-10 ENCOUNTER — Other Ambulatory Visit: Payer: Self-pay

## 2024-04-10 ENCOUNTER — Emergency Department

## 2024-04-10 ENCOUNTER — Non-Acute Institutional Stay (SKILLED_NURSING_FACILITY): Payer: Self-pay | Admitting: Student

## 2024-04-10 ENCOUNTER — Telehealth: Payer: Self-pay | Admitting: Student

## 2024-04-10 ENCOUNTER — Encounter: Payer: Self-pay | Admitting: Emergency Medicine

## 2024-04-10 DIAGNOSIS — U071 COVID-19: Secondary | ICD-10-CM

## 2024-04-10 DIAGNOSIS — S81811A Laceration without foreign body, right lower leg, initial encounter: Secondary | ICD-10-CM

## 2024-04-10 DIAGNOSIS — F0390 Unspecified dementia without behavioral disturbance: Secondary | ICD-10-CM | POA: Diagnosis not present

## 2024-04-10 DIAGNOSIS — W050XXA Fall from non-moving wheelchair, initial encounter: Secondary | ICD-10-CM | POA: Diagnosis not present

## 2024-04-10 DIAGNOSIS — I509 Heart failure, unspecified: Secondary | ICD-10-CM | POA: Diagnosis not present

## 2024-04-10 DIAGNOSIS — W19XXXA Unspecified fall, initial encounter: Secondary | ICD-10-CM

## 2024-04-10 DIAGNOSIS — S8991XA Unspecified injury of right lower leg, initial encounter: Secondary | ICD-10-CM | POA: Diagnosis present

## 2024-04-10 MED ORDER — LIDOCAINE-EPINEPHRINE 2 %-1:100000 IJ SOLN
20.0000 mL | Freq: Once | INTRAMUSCULAR | Status: AC
  Administered 2024-04-10: 20 mL via INTRADERMAL
  Filled 2024-04-10: qty 1

## 2024-04-10 MED ORDER — MORPHINE SULFATE (CONCENTRATE) 20 MG/ML PO SOLN: 5.0000 mg | ORAL | 0 refills | Status: AC | PRN

## 2024-04-10 NOTE — ED Triage Notes (Signed)
 Presents via EMS from [facility]. Per EMS she hit her right lower leg on w/c   Large skin tear noted  Pt also recently tested positive for COVID Also possible UTI

## 2024-04-10 NOTE — ED Provider Notes (Signed)
 [facility] Provider Note   Event Date/Time   First MD Initiated Contact with Patient 04/10/24 0845     (approximate) History  Leg Injury  HPI [name] [name] is a 89 y.o. female with a past medical history of dementia, hyperlipidemia, and CHF who presents from [facility] via EMS after she had a fall hitting the right lower leg on her wheelchair resulting in a large skin tear.  Patient also reportedly tested positive for COVID.  EMS also reported possible UTI however they noted patient only smells like ammonia with no other symptoms at this time.  Patient is a poor historian and only complains of pain to the right leg.  No signs of head trauma ROS: Unable to assess   Physical Exam  Triage Vital Signs: ED Triage Vitals [04/10/24 0851]  Encounter Vitals Group     BP      Girls Systolic BP Percentile      Girls Diastolic BP Percentile      Boys Systolic BP Percentile      Boys Diastolic BP Percentile      Pulse      Resp      Temp      Temp src      SpO2      Weight 137 lb 5.6 oz (62.3 kg)     Height 5' (1.524 m)     Head Circumference      Peak Flow      Pain Score      Pain Loc      Pain Education      Exclude from Growth Chart    Most recent vital signs: Vitals:   04/10/24 0852 04/10/24 0857  BP: (!) 148/53 (!) 148/53  Pulse: 93   Resp:  18  Temp: 98.5 F (36.9 C)   SpO2: 93% 99%   General: Awake, uncooperative CV:  Good peripheral perfusion. Resp:  Normal effort. Abd:  No distention. Other:  Elderly overweight Caucasian female resting comfortably in no acute distress.  There is a 5 cm vertical curvilinear skin tear to the proximal anterior lower leg ED Results / Procedures / Treatments  Labs (all labs ordered are listed, but only abnormal results are displayed) Labs Reviewed - No data to display RADIOLOGY ED MD interpretation: 2 view x-ray of the tib-fib shows soft tissue injury without fracture - All radiology independently  interpreted and agree with radiology assessment Official radiology report(s): DG Tibia/Fibula Right Result Date: 04/10/2024 CLINICAL DATA:  Right knee and lower leg pain and wound following a fall. EXAM: RIGHT TIBIA AND FIBULA - 2 VIEW COMPARISON:  None. FINDINGS: Focal soft tissue swelling and air with overlying bandage material in the anterolateral aspect of the proximal right lower leg. No fracture or dislocation. Diffuse osteopenia. Extensive atheromatous arterial calcifications. IMPRESSION: Soft tissue injury without fracture. Electronically Signed   By: Elspeth Bathe M.D.   On: 04/10/2024 09:24   PROCEDURES: Critical Care performed: No .Laceration Repair  Date/Time: 04/10/2024 10:25 AM  Performed by: Jossie Artist POUR, MD Authorized by: Jossie Artist POUR, MD   Consent:    Consent obtained:  Verbal   Consent given by:  Patient   Risks, benefits, and alternatives were discussed: yes     Risks discussed:  Infection, pain, retained foreign body, need for additional repair, poor cosmetic result, tendon damage, vascular damage, poor wound healing and nerve damage   Alternatives discussed:  No treatment, delayed treatment, observation and referral Universal  protocol:    Immediately prior to procedure, a time out was called: yes     Patient identity confirmed:  Verbally with patient Anesthesia:    Anesthesia method:  Local infiltration   Local anesthetic:  Lidocaine  2% WITH epi Laceration details:    Location:  Leg   Leg location:  R lower leg   Length (cm):  5   Depth (mm):  5 Pre-procedure details:    Preparation:  Patient was prepped and draped in usual sterile fashion Exploration:    Wound exploration: entire depth of wound visualized     Contaminated: no   Treatment:    Area cleansed with:  Povidone-iodine and saline   Amount of cleaning:  Standard   Irrigation solution:  Sterile saline   Irrigation method:  Syringe Approximation:    Approximation:  Close Repair type:    Repair  type:  Simple Post-procedure details:    Dressing:  Antibiotic ointment and non-adherent dressing   Procedure completion:  Tolerated well, no immediate complications  MEDICATIONS ORDERED IN ED: Medications  lidocaine -EPINEPHrine  (XYLOCAINE  W/EPI) 2 %-1:100000 (with pres) injection 20 mL (has no administration in time range)   IMPRESSION / MDM / ASSESSMENT AND PLAN / ED COURSE  I reviewed the triage vital signs and the nursing notes.                             The patient is on the cardiac monitor to evaluate for evidence of arrhythmia and/or significant heart rate changes. Patient's presentation is most consistent with acute presentation with potential threat to life or bodily function. Patient is a 89 year old female with the above-stated past medical history that presents for mechanical fall resulting in a large skin tear to the right lower extremity.  Differential diagnosis includes but is not limited to mechanical fall, right leg laceration, knee fracture, syncope Plan: Right lower extremity x-ray Laceration repair   FINAL CLINICAL IMPRESSION(S) / ED DIAGNOSES   Final diagnoses:  Noninfected skin tear of right lower extremity, initial encounter  Fall, initial encounter   Rx / DC Orders   ED Discharge Orders     None      Note:  This document was prepared using Dragon voice recognition software and may include unintentional dictation errors.   Rhylen Pulido K, MD 04/10/24 [id number]

## 2024-04-10 NOTE — Progress Notes (Signed)
 Location:  Other [facility] Room Number: 202 [id number] Place of Service:  SNF (31) Provider:  Abdul Abdul Fine, MD  Patient Care Team: Abdul Fine, MD as PCP - General (Family Medicine)  Extended Emergency Contact Information Primary Emergency Contact: Dibens,Marcia  [place] Home Phone: [phone] Relation: Daughter  Code Status:  DNR Goals of care: Advanced Directive information    04/10/2024    8:52 AM  Advanced Directives  Does Patient Have a Medical Advance Directive? Yes  Type of Advance Directive Out of facility DNR (pink MOST or yellow form)  Does patient want to make changes to medical advance directive? No - Guardian declined     Chief Complaint  Patient presents with   Follow-up    HPI:  Pt is a 89 y.o. female seen today for an acute visit for ED f/u and laceration evaluation.  Discussed the use of AI scribe software for clinical note transcription with the patient, who gave verbal consent to proceed.  History of Present Illness  Patient was seen in the emergency department this morning after a traumatic skin tear. Returned with bleeding laceration.    Past Medical History:  Diagnosis Date   Allergic rhinitis due to pollen    some shellfish also   Aortic sclerosis 5/12   On echo. no stenosis   Arthritis    Diastolic dysfunction    stress echo otherwise normal 1/04. Repeat 5/11 also negative   History of seasonal allergies    Hx of basal cell carcinoma 1990   multiple sites   Hx of squamous cell carcinoma of skin 2000   R nasolabial fold   Hyperlipidemia    Osteoarthritis, multiple sites    Osteoporosis    Overactive bladder    Urge incontinence    Vaginal odor 04/2018   aerobic vaginitis on One Swab culture   Past Surgical History:  Procedure Laterality Date   CYSTOCELE REPAIR  2007   and rectocele   DILATION AND CURETTAGE OF UTERUS     INGUINAL HERNIA REPAIR  2012   left side with mesh   REVERSE SHOULDER  ARTHROPLASTY Right 04/27/2017   Procedure: REVERSE SHOULDER ARTHROPLASTY;  Surgeon: Edie Norleen PARAS, MD;  Location: [facility] ORS;  Service: Orthopedics;  Laterality: Right;   TONSILLECTOMY AND ADENOIDECTOMY     as child   TOTAL HIP ARTHROPLASTY Right 05/23/2015   Procedure: TOTAL HIP ARTHROPLASTY ANTERIOR APPROACH;  Surgeon: Ozell Flake, MD;  Location: [facility] ORS;  Service: Orthopedics;  Laterality: Right;   UMBILICAL HERNIA REPAIR  02/04/06    Allergies  Allergen Reactions   Cat Dander    Demerol [Meperidine]     dizzy   Dust Mite Extract    Shellfish Allergy    Avelox  [Moxifloxacin  Hcl In Nacl] Rash    Outpatient Encounter Medications as of 04/10/2024  Medication Sig   morphine  (ROXANOL) 20 MG/ML concentrated solution Take 0.25 mLs (5 mg total) by mouth every 4 (four) hours as needed for up to 2 days for severe pain (pain score 7-10) or breakthrough pain.   acetaminophen  (TYLENOL ) 500 MG tablet Take 500 mg by mouth 3 (three) times daily. Scheduled and Give one tablet by mouth every 8 hours as needed for pain.   aspirin  EC 81 MG tablet Take 81 mg by mouth daily. Swallow whole.   brimonidine (ALPHAGAN) 0.2 % ophthalmic solution Place 1 drop into both eyes 2 (two) times daily.   diclofenac Sodium (VOLTAREN) 1 % GEL Apply 4  g topically 3 (three) times daily. To left knee see other listing for shoulder use   diclofenac Sodium (VOLTAREN) 1 % GEL Apply 2 g topically every 8 (eight) hours as needed (to right shoulder).   dorzolamide -timolol  (COSOPT ) 22.3-6.8 MG/ML ophthalmic solution Apply 1 drop to eye 2 (two) times daily.   EPIPEN  2-PAK 0.3 MG/0.3ML SOAJ injection Inject into the muscle as needed for anaphylaxis (or allergic reaction).   fluticasone  (FLONASE ) 50 MCG/ACT nasal spray Place 1 spray into both nostrils 2 (two) times a day.   latanoprost  (XALATAN ) 0.005 % ophthalmic solution Place 1 drop into both eyes at bedtime.   levocetirizine (XYZAL ) 5 MG tablet Take 5 mg by mouth every evening.    melatonin 3 MG TABS tablet Take 3 mg by mouth at bedtime.   mirtazapine (REMERON) 15 MG tablet Take 15 mg by mouth at bedtime.   Multiple Vitamin (MULTIVITAMIN) tablet Take 1 tablet by mouth daily.   Nutritional Supplements (ENSURE CLEAR) LIQD Take 1 Container by mouth 2 (two) times daily.   polyethylene glycol (MIRALAX  / GLYCOLAX ) 17 g packet Take 17 g by mouth daily.   Probiotic Product (PROBIOTIC ADVANCED PO) Take 1 capsule by mouth daily.   Simethicone  (GAS-X PO) Take by mouth 2 (two) times daily.   traMADol  (ULTRAM ) 50 MG tablet Take 1 tablet (50 mg total) by mouth every 8 (eight) hours as needed. Give one tablet by mouth at bedtime   traMADol  (ULTRAM ) 50 MG tablet Take 50 mg by mouth at bedtime. See as needed dosing also   No facility-administered encounter medications on file as of 04/10/2024.    Review of Systems  Immunization History  Administered Date(s) Administered   INFLUENZA, HIGH DOSE SEASONAL PF 04/28/2017, 05/16/2018, 05/19/2022   Influenza, Seasonal, Injecte, Preservative Fre 05/28/2016   Influenza,inj,Quad PF,6+ Mos 05/25/2013, 05/07/2015   Influenza-Unspecified 04/24/2014, 05/20/2021, 05/26/2023   Moderna Covid-19 Fall Seasonal Vaccine 69yrs & older 11/10/2022   Moderna SARS-COV2 Booster Vaccination 06/14/2020, 12/20/2020, 12/30/2021   Moderna Sars-Covid-2 Vaccination 08/14/2019, 09/11/2019, 06/14/2020, 04/25/2021, 06/12/2022   Pfizer Covid-19 Vaccine Bivalent Booster 81yrs & up 04/25/2021   Pneumococcal Conjugate-13 02/07/2014   Pneumococcal Polysaccharide-23 02/15/2012, 03/07/2018   Td 09/12/2002, 02/06/2013   Unspecified SARS-COV-2 Vaccination 04/30/2023, 11/26/2023   Zoster, Live 02/19/2016   Pertinent  Health Maintenance Due  Topic Date Due   Influenza Vaccine  03/03/2024   DEXA SCAN  Completed      07/08/2021    8:09 AM 08/13/2021    6:31 AM 12/31/2022    2:58 PM 02/05/2023    1:15 PM 01/13/2024    2:16 PM  Fall Risk  Falls in the past year?   1 1 1   Was  there an injury with Fall?    0 1  Fall Risk Category Calculator    2 3  (RETIRED) Patient Fall Risk Level High fall risk  Moderate fall risk      Patient at Risk for Falls Due to   History of fall(s);Impaired balance/gait History of fall(s);Impaired balance/gait History of fall(s);Impaired balance/gait  Fall risk Follow up     Falls evaluation completed     Data saved with a previous flowsheet row definition   Functional Status Survey:    There were no vitals filed for this visit. There is no height or weight on file to calculate BMI. Physical Exam Skin:    Comments: RLE with upside down V laceration 5cm across with bleeding. Deep to subcutaneous tissue.  Labs reviewed: Recent Labs    08/16/23 0000 11/11/23 0000 12/03/23 0000  NA 135* 138 137  K 4.5 4.1 3.9  CL 104 103 103  CO2 24* 25* 25*  BUN 23* 28* 22*  CREATININE 0.7 0.7 0.8  CALCIUM 9.0 8.8 8.9   Recent Labs    05/06/23 0000 08/16/23 0000 11/11/23 0000 12/03/23 0000  AST 20 16 22 19   ALT 15 7 11 10   ALKPHOS 95 94 90  --   ALBUMIN 4.2 3.6 3.6 3.6   Recent Labs    08/16/23 0000 11/11/23 0000 12/03/23 0000  WBC 7.8 12.1 9.5  NEUTROABS 2,215.00  --  3,838.00  HGB 12.4 11.8* 12.6  HCT 38 36 38  PLT 211 236 288   Lab Results  Component Value Date   TSH 1.27 02/06/2013   No results found for: HGBA1C Lab Results  Component Value Date   CHOL 232 (H) 02/06/2013   HDL 77.50 02/06/2013   LDLDIRECT 138.4 02/06/2013   TRIG 72.0 02/06/2013   CHOLHDL 3 02/06/2013    Significant Diagnostic Results in last 30 days:  DG Tibia/Fibula Right Result Date: 04/10/2024 CLINICAL DATA:  Right knee and lower leg pain and wound following a fall. EXAM: RIGHT TIBIA AND FIBULA - 2 VIEW COMPARISON:  None. FINDINGS: Focal soft tissue swelling and air with overlying bandage material in the anterolateral aspect of the proximal right lower leg. No fracture or dislocation. Diffuse osteopenia. Extensive atheromatous  arterial calcifications. IMPRESSION: Soft tissue injury without fracture. Electronically Signed   By: Elspeth Bathe M.D.   On: 04/10/2024 09:24    Assessment/Plan Laceration of right lower extremity, initial encounter - Plan: Tdap vaccine greater than or equal to 7yo IM, morphine  (ROXANOL) 20 MG/ML concentrated solution  COVID-19 - Plan: Tdap vaccine greater than or equal to 7yo IM  Patient had a large leg laceration of the right leg. She is moaning in her bed at this time. She presented to the ED after nursing had concern the deeper muscle was torn. On further evaluation, noted to have subcutaneous tissue damage. Edematous after injection of local anesthetic and wound is too edematous to close at this time. Applied quickclot to open, bleeding laceration for adequate hemostasis. Applied abd. Advised nursing to remove with sterile saline tomorrow and apply xeroform w/ ABD pads daily. Patient lethargic with COVID today,however, remains afebrile. She is eating and drinking some protein supplementation. Continue supportive care. Has until 9/13 to start treatment for COVID infection.    Family/ staff Communication: nursing  Labs/tests ordered:  none

## 2024-04-10 NOTE — ED Notes (Signed)
 Patient cleaned up and dry brief applied

## 2024-04-10 NOTE — Telephone Encounter (Signed)
 Patient's leg was caught on her wheelchair during the transfer with the aid this morning. Deep laceration with fat exposure-- potential deeper injury. Recommend she have an evaluation and management in the emergency department. Patient has an active DNR and DO NOT HOSPITALIZE orders in the facility. Preference for return to facility once wound is managed.

## 2024-04-10 NOTE — ED Notes (Signed)
 [facility]  called for  transport to  [facility]

## 2024-04-11 ENCOUNTER — Encounter: Payer: Self-pay | Admitting: Student

## 2024-04-18 ENCOUNTER — Non-Acute Institutional Stay (SKILLED_NURSING_FACILITY): Payer: Self-pay | Admitting: Nurse Practitioner

## 2024-04-18 ENCOUNTER — Encounter: Payer: Self-pay | Admitting: Nurse Practitioner

## 2024-04-18 DIAGNOSIS — U071 COVID-19: Secondary | ICD-10-CM

## 2024-04-18 DIAGNOSIS — S81811D Laceration without foreign body, right lower leg, subsequent encounter: Secondary | ICD-10-CM | POA: Diagnosis not present

## 2024-04-18 DIAGNOSIS — E441 Mild protein-calorie malnutrition: Secondary | ICD-10-CM | POA: Diagnosis not present

## 2024-04-18 DIAGNOSIS — I5032 Chronic diastolic (congestive) heart failure: Secondary | ICD-10-CM

## 2024-04-18 DIAGNOSIS — F03B3 Unspecified dementia, moderate, with mood disturbance: Secondary | ICD-10-CM | POA: Diagnosis not present

## 2024-04-18 DIAGNOSIS — K5909 Other constipation: Secondary | ICD-10-CM

## 2024-04-18 NOTE — Progress Notes (Signed)
 Location:  Other [facility].  Nursing Home Room Number: [facility] Place of Service:  SNF [phone]) Harlene An, NP  PCP: Abdul Fine, MD  Patient Care Team: Abdul Fine, MD as PCP - General [facility])  Extended Emergency Contact Information Primary Emergency Contact: Dibens,Marcia  [place] Home Phone: [phone] Relation: Daughter  Goals of care: Advanced Directive information    04/10/2024    8:52 AM  Advanced Directives  Does Patient Have a Medical Advance Directive? Yes  Type of Advance Directive Out of facility DNR (pink MOST or yellow form)  Does patient want to make changes to medical advance directive? No - Guardian declined     Chief Complaint  Patient presents with   Medical Management of Chronic Issues    Medical Management of Chronic Issues.     HPI:  Pt is a 89 y.o. female seen today for medical management of chronic disease. Pt with hx of dementia.  She had an incident where her leg was caught on the wheelchair resulting in a deep laceration and fat layer exposed on 9/8. She was sent to the ED for evaluation. Now laceration is being managed by wound care. Staff packing wound with dankin and applying wrap. She is taking morphine  twice daily to manage pain during dressing changes.  She also had had recent COVID infection. She has not had significant cough, congestion or shortness of breath She reports she feels well at this time Her appetite has been down since having COVID and staff monitoring.    Past Medical History:  Diagnosis Date   Allergic rhinitis due to pollen    some shellfish also   Aortic sclerosis 5/12   On echo. no stenosis   Arthritis    Diastolic dysfunction    stress echo otherwise normal 1/04. Repeat 5/11 also negative   History of seasonal allergies    Hx of basal cell carcinoma 1990   multiple sites   Hx of squamous cell carcinoma of skin 2000   R nasolabial fold   Hyperlipidemia     Osteoarthritis, multiple sites    Osteoporosis    Overactive bladder    Urge incontinence    Vaginal odor 04/2018   aerobic vaginitis on One Swab culture   Past Surgical History:  Procedure Laterality Date   CYSTOCELE REPAIR  2007   and rectocele   DILATION AND CURETTAGE OF UTERUS     INGUINAL HERNIA REPAIR  2012   left side with mesh   REVERSE SHOULDER ARTHROPLASTY Right 04/27/2017   Procedure: REVERSE SHOULDER ARTHROPLASTY;  Surgeon: Edie Norleen PARAS, MD;  Location: [facility] ORS;  Service: Orthopedics;  Laterality: Right;   TONSILLECTOMY AND ADENOIDECTOMY     as child   TOTAL HIP ARTHROPLASTY Right 05/23/2015   Procedure: TOTAL HIP ARTHROPLASTY ANTERIOR APPROACH;  Surgeon: Ozell Flake, MD;  Location: [facility] ORS;  Service: Orthopedics;  Laterality: Right;   UMBILICAL HERNIA REPAIR  02/04/06    Allergies  Allergen Reactions   Cat Dander    Demerol [Meperidine]     dizzy   Dust Mite Extract    Shellfish Allergy    Avelox  [Moxifloxacin  Hcl In Nacl] Rash    Outpatient Encounter Medications as of 04/18/2024  Medication Sig   acetaminophen  (TYLENOL ) 500 MG tablet Take 500 mg by mouth 3 (three) times daily. Scheduled and Give one tablet by mouth every 8 hours as needed for pain.   aspirin  EC 81 MG tablet Take 81 mg by  mouth daily. Swallow whole.   brimonidine (ALPHAGAN) 0.2 % ophthalmic solution Place 1 drop into both eyes 2 (two) times daily.   diclofenac Sodium (VOLTAREN) 1 % GEL Apply 4 g topically 3 (three) times daily. To left knee see other listing for shoulder use   diclofenac Sodium (VOLTAREN) 1 % GEL Apply 2 g topically every 8 (eight) hours as needed (to right shoulder).   dorzolamide -timolol  (COSOPT ) 22.3-6.8 MG/ML ophthalmic solution Apply 1 drop to eye 2 (two) times daily.   EPIPEN  2-PAK 0.3 MG/0.3ML SOAJ injection Inject into the muscle as needed for anaphylaxis (or allergic reaction).   fluticasone  (FLONASE ) 50 MCG/ACT nasal spray Place 1 spray into both nostrils 2 (two)  times a day.   latanoprost  (XALATAN ) 0.005 % ophthalmic solution Place 1 drop into both eyes at bedtime.   levocetirizine (XYZAL ) 5 MG tablet Take 5 mg by mouth every evening.   melatonin 3 MG TABS tablet Take 3 mg by mouth at bedtime.   mirtazapine (REMERON) 15 MG tablet Take 15 mg by mouth at bedtime.   morphine  20 MG/5ML solution Take 2.5 mLs by mouth every 12 (twelve) hours as needed for pain.   Multiple Vitamin (MULTIVITAMIN) tablet Take 1 tablet by mouth daily.   Nutritional Supplements (ENSURE CLEAR) LIQD Take 1 Container by mouth 2 (two) times daily.   polyethylene glycol (MIRALAX  / GLYCOLAX ) 17 g packet Take 17 g by mouth daily.   Probiotic Product (PROBIOTIC ADVANCED PO) Take 1 capsule by mouth daily.   Simethicone  (GAS-X PO) Take by mouth 2 (two) times daily.   Sodium Hypochlorite (DAKINS, FULL STRENGTH,) 0.5 % SOLN Apply topically 2 (two) times daily.   traMADol  (ULTRAM ) 50 MG tablet Take 1 tablet (50 mg total) by mouth every 8 (eight) hours as needed. Give one tablet by mouth at bedtime   traMADol  (ULTRAM ) 50 MG tablet Take 50 mg by mouth at bedtime. See as needed dosing also   No facility-administered encounter medications on file as of 04/18/2024.    Review of Systems  Unable to perform ROS: Dementia     Immunization History  Administered Date(s) Administered   INFLUENZA, HIGH DOSE SEASONAL PF 04/28/2017, 05/16/2018, 05/19/2022   Influenza, Seasonal, Injecte, Preservative Fre 05/28/2016   Influenza,inj,Quad PF,6+ Mos 05/25/2013, 05/07/2015   Influenza-Unspecified 04/24/2014, 05/20/2021, 05/26/2023   Moderna Covid-19 Fall Seasonal Vaccine 35yrs & older 11/10/2022   Moderna SARS-COV2 Booster Vaccination 06/14/2020, 12/20/2020, 12/30/2021   Moderna Sars-Covid-2 Vaccination 08/14/2019, 09/11/2019, 06/14/2020, 04/25/2021, 06/12/2022   PFIZER(Purple Top)SARS-COV-2 Vaccination 12/05/2019, 12/05/2020   Pfizer Covid-19 Vaccine Bivalent Booster 51yrs & up 04/25/2021    Pneumococcal Conjugate-13 02/07/2014   Pneumococcal Polysaccharide-23 02/15/2012, 05/29/2015, 12/10/2015, 12/08/2016, 12/07/2017, 03/07/2018, 12/05/2019, 12/16/2021, 12/17/2022   Td 09/12/2002, 02/06/2013, 04/10/2024   Unspecified SARS-COV-2 Vaccination 04/30/2023, 11/26/2023   Zoster, Live 02/19/2016   Pertinent  Health Maintenance Due  Topic Date Due   Influenza Vaccine  03/03/2024   DEXA SCAN  Completed      07/08/2021    8:09 AM 08/13/2021    6:31 AM 12/31/2022    2:58 PM 02/05/2023    1:15 PM 01/13/2024    2:16 PM  Fall Risk  Falls in the past year?   1 1 1   Was there an injury with Fall?    0 1  Fall Risk Category Calculator    2 3  (RETIRED) Patient Fall Risk Level High fall risk  Moderate fall risk      Patient at Risk for Falls Due  to   History of fall(s);Impaired balance/gait History of fall(s);Impaired balance/gait History of fall(s);Impaired balance/gait  Fall risk Follow up     Falls evaluation completed     Data saved with a previous flowsheet row definition   Functional Status Survey:    Vitals:   04/18/24 0834 04/18/24 0836  BP: (!) 149/72 136/70  Pulse: 74   Resp: 20   Temp: 97.9 F (36.6 C)   SpO2: 95%   Weight: 140 lb (63.5 kg)   Height: 5' (1.524 m)    Body mass index is 27.34 kg/m. Physical Exam Constitutional:      General: She is not in acute distress.    Appearance: She is well-developed. She is not diaphoretic.  HENT:     Head: Normocephalic and atraumatic.     Mouth/Throat:     Pharynx: No oropharyngeal exudate.  Eyes:     Conjunctiva/sclera: Conjunctivae normal.     Pupils: Pupils are equal, round, and reactive to light.  Cardiovascular:     Rate and Rhythm: Normal rate and regular rhythm.     Heart sounds: Normal heart sounds.  Pulmonary:     Effort: Pulmonary effort is normal.     Breath sounds: Normal breath sounds.  Abdominal:     General: Bowel sounds are normal.     Palpations: Abdomen is soft.  Musculoskeletal:     Cervical  back: Normal range of motion and neck supple.     Right lower leg: No edema.     Left lower leg: No edema.  Skin:    General: Skin is warm and dry.     Comments: Laceration noted to right leg with hematoma, tenderness noted, without signs of infection   Neurological:     Mental Status: She is alert. Mental status is at baseline.  Psychiatric:        Mood and Affect: Mood normal.     Labs reviewed: Recent Labs    08/16/23 0000 11/11/23 0000 12/03/23 0000  NA 135* 138 137  K 4.5 4.1 3.9  CL 104 103 103  CO2 24* 25* 25*  BUN 23* 28* 22*  CREATININE 0.7 0.7 0.8  CALCIUM 9.0 8.8 8.9   Recent Labs    05/06/23 0000 08/16/23 0000 11/11/23 0000 12/03/23 0000  AST 20 16 22 19   ALT 15 7 11 10   ALKPHOS 95 94 90  --   ALBUMIN 4.2 3.6 3.6 3.6   Recent Labs    08/16/23 0000 11/11/23 0000 12/03/23 0000  WBC 7.8 12.1 9.5  NEUTROABS 2,215.00  --  3,838.00  HGB 12.4 11.8* 12.6  HCT 38 36 38  PLT 211 236 288   Lab Results  Component Value Date   TSH 1.27 02/06/2013   No results found for: HGBA1C Lab Results  Component Value Date   CHOL 232 (H) 02/06/2013   HDL 77.50 02/06/2013   LDLDIRECT 138.4 02/06/2013   TRIG 72.0 02/06/2013   CHOLHDL 3 02/06/2013    Significant Diagnostic Results in last 30 days:  DG Tibia/Fibula Right Result Date: 04/10/2024 CLINICAL DATA:  Right knee and lower leg pain and wound following a fall. EXAM: RIGHT TIBIA AND FIBULA - 2 VIEW COMPARISON:  None. FINDINGS: Focal soft tissue swelling and air with overlying bandage material in the anterolateral aspect of the proximal right lower leg. No fracture or dislocation. Diffuse osteopenia. Extensive atheromatous arterial calcifications. IMPRESSION: Soft tissue injury without fracture. Electronically Signed   By: Elspeth Robynn HERO.D.  On: 04/10/2024 09:24    Assessment/Plan Laceration of right lower extremity, subsequent encounter Stable, continues with morphine  BID for dressing changes, no signs of  infection.   COVID-19 Diagnosised on 9/8, awaiting recheck at facility. No symptoms at this time.  Malnutrition of mild degree (HCC) Assessment & Plan: Decrease in appetite after COVID and recent leg injury. Continues on Remeron and support from staff. Monitor weights.    Moderate dementia with mood disturbance, unspecified dementia type [facility]) Assessment & Plan: Stable, no acute changes in cognitive or functional status, continue supportive care.    Chronic diastolic heart failure (HCC) Overview: stress echo otherwise normal 1/04  Assessment & Plan: Euvolemic at this time, continue to monitor.    Chronic constipation Assessment & Plan: Well controlled on current regimen, continue to monitor and adjust as needed.         Kaidin Boehle K. Caro BODILY [facility] [phone]

## 2024-04-18 NOTE — Assessment & Plan Note (Signed)
 Well controlled on current regimen, continue to monitor and adjust as needed.

## 2024-04-18 NOTE — Assessment & Plan Note (Signed)
 Decrease in appetite after COVID and recent leg injury. Continues on Remeron and support from staff. Monitor weights.

## 2024-04-18 NOTE — Assessment & Plan Note (Signed)
 Euvolemic at this time, continue to monitor.

## 2024-04-18 NOTE — Assessment & Plan Note (Signed)
 Stable, no acute changes in cognitive or functional status, continue supportive care.

## 2024-06-05 ENCOUNTER — Non-Acute Institutional Stay: Payer: Self-pay | Admitting: Orthopedic Surgery

## 2024-06-05 ENCOUNTER — Encounter: Payer: Self-pay | Admitting: Orthopedic Surgery

## 2024-06-05 DIAGNOSIS — F03B0 Unspecified dementia, moderate, without behavioral disturbance, psychotic disturbance, mood disturbance, and anxiety: Secondary | ICD-10-CM

## 2024-06-05 DIAGNOSIS — I700 Atherosclerosis of aorta: Secondary | ICD-10-CM

## 2024-06-05 DIAGNOSIS — G8929 Other chronic pain: Secondary | ICD-10-CM

## 2024-06-05 DIAGNOSIS — E441 Mild protein-calorie malnutrition: Secondary | ICD-10-CM | POA: Diagnosis not present

## 2024-06-05 DIAGNOSIS — M545 Low back pain, unspecified: Secondary | ICD-10-CM

## 2024-06-05 DIAGNOSIS — S81811D Laceration without foreign body, right lower leg, subsequent encounter: Secondary | ICD-10-CM

## 2024-06-05 DIAGNOSIS — I5032 Chronic diastolic (congestive) heart failure: Secondary | ICD-10-CM | POA: Diagnosis not present

## 2024-06-05 LAB — COMPREHENSIVE METABOLIC PANEL WITH GFR
Albumin: 3.8 (ref 3.5–5.0)
Calcium: 8.6 — AB (ref 8.7–10.7)
Globulin: 2.8
eGFR: 79

## 2024-06-05 LAB — CBC AND DIFFERENTIAL
HCT: 35 — AB (ref 36–46)
Hemoglobin: 11.3 — AB (ref 12.0–16.0)
Neutrophils Absolute: 3627
Platelets: 322 K/uL (ref 150–400)
WBC: 9

## 2024-06-05 LAB — HEPATIC FUNCTION PANEL
ALT: 9 U/L (ref 7–35)
AST: 16 (ref 13–35)
Alkaline Phosphatase: 96 (ref 25–125)
Bilirubin, Total: 0.4

## 2024-06-05 LAB — BASIC METABOLIC PANEL WITH GFR
BUN: 22 — AB (ref 4–21)
CO2: 25 — AB (ref 13–22)
Chloride: 104 (ref 99–108)
Creatinine: 0.7 (ref 0.5–1.1)
Glucose: 76
Potassium: 4.4 meq/L (ref 3.5–5.1)
Sodium: 138 (ref 137–147)

## 2024-06-05 LAB — CBC: RBC: 3.54 — AB (ref 3.87–5.11)

## 2024-06-05 NOTE — Progress Notes (Signed)
 Location:  Other [place].  Nursing Home Room Number: [facility] Place of Service:  SNF [phone]) Provider:  Greig Cluster, NP  PCP: Laurence Locus, DO  Patient Care Team: Laurence Locus, DO as PCP - General (Internal Medicine)  Extended Emergency Contact Information Primary Emergency Contact: Dibens,Marcia  [place] Home Phone: [phone] Relation: Daughter  Code Status:  DNR Goals of care: Advanced Directive information    06/05/2024   10:34 AM  Advanced Directives  Does Patient Have a Medical Advance Directive? Yes  Type of Advance Directive Out of facility DNR (pink MOST or yellow form)  Does patient want to make changes to medical advance directive? No - Patient declined     Chief Complaint  Patient presents with   Medical Management of Chronic Issues    Medical Management of Chronic Issues.     HPI:  Pt is a 89 y.o. female seen today for medical management of chronic diseases.    She currently resides on the skilled nursing unit at [facility]. PMH: CHF, HLD, PVD, atherosclerosis, GERD, constipation, dementia, OA, RATH 2016, osteoporosis, incontinence, insomnia, squamous cell carcinoma 2000, basal cell carcinoma 1990.   CHF- see weights below, not on diuretics, wears compression hose daily  Dementia- UTA BIMS, no behaviors, dependent with ADLs except feeding, wander guard on w/c, ambulates with wheelchair Malnutrition- BMI 27.50, remains on Remeron  Atherosclerosis of Aorta- remains on asa RLE wound- onset 04/10/2024, followed by wound care physician, completed doxycycline, slow healing but improving, recent measurement 2.2 cm x 4.5 cm x 0.3 cm, now wearing skin bumpers to lower extremities Chronic back pain- initially on morphine > weaned to Tramadol    Recent weights:  10/01- 140.8 lbs  09/03- 140 lbs  08/01- 137.4 lbs  Recent blood pressures:  10/29- 158/77  10/23- 143/83  10/22- 123/68    Past Medical History:  Diagnosis Date   Allergic  rhinitis due to pollen    some shellfish also   Aortic sclerosis 5/12   On echo. no stenosis   Arthritis    Diastolic dysfunction    stress echo otherwise normal 1/04. Repeat 5/11 also negative   History of seasonal allergies    Hx of basal cell carcinoma 1990   multiple sites   Hx of squamous cell carcinoma of skin 2000   R nasolabial fold   Hyperlipidemia    Osteoarthritis, multiple sites    Osteoporosis    Overactive bladder    Urge incontinence    Vaginal odor 04/2018   aerobic vaginitis on One Swab culture   Past Surgical History:  Procedure Laterality Date   CYSTOCELE REPAIR  2007   and rectocele   DILATION AND CURETTAGE OF UTERUS     INGUINAL HERNIA REPAIR  2012   left side with mesh   REVERSE SHOULDER ARTHROPLASTY Right 04/27/2017   Procedure: REVERSE SHOULDER ARTHROPLASTY;  Surgeon: Edie Norleen PARAS, MD;  Location: [facility] ORS;  Service: Orthopedics;  Laterality: Right;   TONSILLECTOMY AND ADENOIDECTOMY     as child   TOTAL HIP ARTHROPLASTY Right 05/23/2015   Procedure: TOTAL HIP ARTHROPLASTY ANTERIOR APPROACH;  Surgeon: Ozell Flake, MD;  Location: [facility] ORS;  Service: Orthopedics;  Laterality: Right;   UMBILICAL HERNIA REPAIR  02/04/06    Allergies  Allergen Reactions   Cat Dander    Demerol [Meperidine]     dizzy   Dust Mite Extract    Shellfish Allergy    Avelox  [Moxifloxacin  Hcl In Nacl] Rash  Outpatient Encounter Medications as of 06/05/2024  Medication Sig   acetaminophen  (TYLENOL ) 500 MG tablet Take 500 mg by mouth 3 (three) times daily. Scheduled and Give one tablet by mouth every 8 hours as needed for pain.   aspirin  EC 81 MG tablet Take 81 mg by mouth daily. Swallow whole.   brimonidine (ALPHAGAN) 0.2 % ophthalmic solution Place 1 drop into both eyes 2 (two) times daily.   diclofenac Sodium (VOLTAREN) 1 % GEL Apply 4 g topically 3 (three) times daily. To left knee see other listing for shoulder use   diclofenac Sodium (VOLTAREN) 1 % GEL Apply 2 g  topically every 8 (eight) hours as needed (to right shoulder).   dorzolamide -timolol  (COSOPT ) 22.3-6.8 MG/ML ophthalmic solution Apply 1 drop to eye 2 (two) times daily.   EPIPEN  2-PAK 0.3 MG/0.3ML SOAJ injection Inject into the muscle as needed for anaphylaxis (or allergic reaction).   fluticasone  (FLONASE ) 50 MCG/ACT nasal spray Place 1 spray into both nostrils 2 (two) times a day.   latanoprost  (XALATAN ) 0.005 % ophthalmic solution Place 1 drop into both eyes at bedtime.   levocetirizine (XYZAL ) 5 MG tablet Take 5 mg by mouth every evening.   melatonin 3 MG TABS tablet Take 3 mg by mouth at bedtime.   mirtazapine (REMERON) 15 MG tablet Take 15 mg by mouth at bedtime.   Multiple Vitamin (MULTIVITAMIN) tablet Take 1 tablet by mouth daily.   Nutritional Supplements (ENSURE CLEAR) LIQD Take 1 Container by mouth 2 (two) times daily.   polyethylene glycol (MIRALAX  / GLYCOLAX ) 17 g packet Take 17 g by mouth daily.   Probiotic Product (PROBIOTIC ADVANCED PO) Take 1 capsule by mouth daily.   Simethicone  (GAS-X PO) Take by mouth 2 (two) times daily.   traMADol  (ULTRAM ) 50 MG tablet Take 1 tablet (50 mg total) by mouth every 8 (eight) hours as needed. Give one tablet by mouth at bedtime   traMADol  (ULTRAM ) 50 MG tablet Take 50 mg by mouth at bedtime. See as needed dosing also   morphine  20 MG/5ML solution Take 2.5 mLs by mouth every 12 (twelve) hours as needed for pain. (Patient not taking: Reported on 06/05/2024)   Sodium Hypochlorite (DAKINS, FULL STRENGTH,) 0.5 % SOLN Apply topically 2 (two) times daily. (Patient not taking: Reported on 06/05/2024)   No facility-administered encounter medications on file as of 06/05/2024.    Review of Systems  Unable to perform ROS: Dementia    Immunization History  Administered Date(s) Administered   INFLUENZA, HIGH DOSE SEASONAL PF 04/28/2017, 05/16/2018, 05/19/2022   Influenza, Seasonal, Injecte, Preservative Fre 05/28/2016   Influenza,inj,Quad PF,6+ Mos  05/25/2013, 05/07/2015   Influenza-Unspecified 04/24/2014, 05/20/2021, 05/26/2023, 05/16/2024   Moderna Covid-19 Fall Seasonal Vaccine 60yrs & older 11/10/2022   Moderna SARS-COV2 Booster Vaccination 06/14/2020, 12/20/2020, 12/30/2021   Moderna Sars-Covid-2 Vaccination 08/14/2019, 09/11/2019, 06/14/2020, 04/25/2021, 06/12/2022   PFIZER(Purple Top)SARS-COV-2 Vaccination 12/05/2019, 12/05/2020   Pfizer Covid-19 Vaccine Bivalent Booster 21yrs & up 04/25/2021   Pneumococcal Conjugate-13 02/07/2014   Pneumococcal Polysaccharide-23 02/15/2012, 05/29/2015, 12/10/2015, 12/08/2016, 12/07/2017, 03/07/2018, 12/05/2019, 12/16/2021, 12/17/2022   Td 09/12/2002, 02/06/2013, 04/10/2024   Unspecified SARS-COV-2 Vaccination 04/30/2023, 11/26/2023   Zoster, Live 02/19/2016   Pertinent  Health Maintenance Due  Topic Date Due   Influenza Vaccine  Completed   DEXA SCAN  Completed      07/08/2021    8:09 AM 08/13/2021    6:31 AM 12/31/2022    2:58 PM 02/05/2023    1:15 PM 01/13/2024  2:16 PM  Fall Risk  Falls in the past year?   1 1 1   Was there an injury with Fall?    0 1  Fall Risk Category Calculator    2 3  (RETIRED) Patient Fall Risk Level High fall risk  Moderate fall risk      Patient at Risk for Falls Due to   History of fall(s);Impaired balance/gait History of fall(s);Impaired balance/gait History of fall(s);Impaired balance/gait  Fall risk Follow up     Falls evaluation completed     Data saved with a previous flowsheet row definition   Functional Status Survey:    Vitals:   06/05/24 1021  BP: (!) 158/77  Pulse: 71  Resp: 18  Temp: 98.6 F (37 C)  SpO2: 94%  Weight: 140 lb 12.8 oz (63.9 kg)  Height: 5' (1.524 m)   Body mass index is 27.5 kg/m. Physical Exam Vitals reviewed.  Constitutional:      General: She is not in acute distress. HENT:     Head: Normocephalic.     Ears:     Comments: Bilateral hearing aids Eyes:     General:        Right eye: No discharge.        Left  eye: No discharge.  Cardiovascular:     Rate and Rhythm: Normal rate and regular rhythm.     Pulses: Normal pulses.     Heart sounds: Normal heart sounds.  Pulmonary:     Effort: Pulmonary effort is normal.     Breath sounds: Normal breath sounds.  Abdominal:     General: Bowel sounds are normal. There is no distension.     Palpations: Abdomen is soft.     Tenderness: There is no abdominal tenderness.  Musculoskeletal:     Cervical back: Neck supple.     Right lower leg: No edema.     Left lower leg: No edema.  Skin:    General: Skin is warm.     Capillary Refill: Capillary refill takes less than 2 seconds.  Neurological:     General: No focal deficit present.     Mental Status: She is alert and oriented to person, place, and time.  Psychiatric:        Mood and Affect: Mood normal.     Labs reviewed: Recent Labs    08/16/23 0000 11/11/23 0000 12/03/23 0000  NA 135* 138 137  K 4.5 4.1 3.9  CL 104 103 103  CO2 24* 25* 25*  BUN 23* 28* 22*  CREATININE 0.7 0.7 0.8  CALCIUM 9.0 8.8 8.9   Recent Labs    08/16/23 0000 11/11/23 0000 12/03/23 0000  AST 16 22 19   ALT 7 11 10   ALKPHOS 94 90  --   ALBUMIN 3.6 3.6 3.6   Recent Labs    08/16/23 0000 11/11/23 0000 12/03/23 0000  WBC 7.8 12.1 9.5  NEUTROABS 2,215.00  --  3,838.00  HGB 12.4 11.8* 12.6  HCT 38 36 38  PLT 211 236 288   Lab Results  Component Value Date   TSH 1.27 02/06/2013   No results found for: HGBA1C Lab Results  Component Value Date   CHOL 232 (H) 02/06/2013   HDL 77.50 02/06/2013   LDLDIRECT 138.4 02/06/2013   TRIG 72.0 02/06/2013   CHOLHDL 3 02/06/2013    Significant Diagnostic Results in last 30 days:  No results found.  Assessment/Plan 1. Chronic diastolic heart failure (HCC) (Primary) - compensated -  not on diuretics - cont compression stockings  2. Moderate dementia without behavioral disturbance, psychotic disturbance, mood disturbance, or anxiety, unspecified dementia  type (HCC) - no behaviors - no recent BIMS - weights stable - ambulates with w/c - dependent with ADLs - cont skilled nursing   3. Malnutrition of mild degree - BMI 27 - weight stable - cont Remeron  4. Atherosclerosis of aorta - cont asa  5. Skin tear of right lower leg without complication, subsequent encounter - DOI 04/10/2024 - followed by wound physician - slow healing - completed doxycycline - cont routine dressing changes with xeroform  6. Chronic midline low back pain without sciatica - ongoing - weaned off morphine  concentrate - cont tramadol      Family/ staff Communication: plan discussed with patient and nurse  Labs/tests ordered:  routine labs 06/2024

## 2024-07-04 ENCOUNTER — Encounter: Payer: Self-pay | Admitting: Internal Medicine

## 2024-07-04 ENCOUNTER — Non-Acute Institutional Stay (SKILLED_NURSING_FACILITY): Payer: Self-pay | Admitting: Internal Medicine

## 2024-07-04 DIAGNOSIS — I5032 Chronic diastolic (congestive) heart failure: Secondary | ICD-10-CM

## 2024-07-04 DIAGNOSIS — R011 Cardiac murmur, unspecified: Secondary | ICD-10-CM | POA: Insufficient documentation

## 2024-07-04 DIAGNOSIS — H4089 Other specified glaucoma: Secondary | ICD-10-CM

## 2024-07-04 DIAGNOSIS — M150 Primary generalized (osteo)arthritis: Secondary | ICD-10-CM

## 2024-07-04 DIAGNOSIS — K5909 Other constipation: Secondary | ICD-10-CM

## 2024-07-04 DIAGNOSIS — R634 Abnormal weight loss: Secondary | ICD-10-CM | POA: Insufficient documentation

## 2024-07-04 DIAGNOSIS — J301 Allergic rhinitis due to pollen: Secondary | ICD-10-CM

## 2024-07-04 DIAGNOSIS — Z66 Do not resuscitate: Secondary | ICD-10-CM | POA: Insufficient documentation

## 2024-07-04 DIAGNOSIS — F5101 Primary insomnia: Secondary | ICD-10-CM

## 2024-07-04 NOTE — Assessment & Plan Note (Addendum)
 On scheduled tylenol  500 mg tid, prn ultram  50 mg and topical Voltaren gel tid.

## 2024-07-04 NOTE — Assessment & Plan Note (Addendum)
 Continue with melatonin 3 mg

## 2024-07-04 NOTE — Assessment & Plan Note (Addendum)
 Verified the patient is a DO NOT RESUSCITATE.

## 2024-07-04 NOTE — Assessment & Plan Note (Addendum)
 Weight in 07-2021 was 158 lbs, in 07-2022 was 145.6 lbs, in 07-2023 was 143.2 lbs, in November 2025 was 140.2 lbs.  Pt started on Remeron 15 mg at bedtime for appetite stimulation.

## 2024-07-04 NOTE — Assessment & Plan Note (Addendum)
 Continue with alphagan, cosopt  and xalatan  eye drops

## 2024-07-04 NOTE — Assessment & Plan Note (Addendum)
 Stable. Not on any daily or prn diuretics.

## 2024-07-04 NOTE — Assessment & Plan Note (Addendum)
 Last known echo was from 12-03-2010. Pt does have a harsh LUSB murmur that sounds like an aortic stenosis murmur. But at age 89 yo she is not a candidate for surgical AVR or TAVR.

## 2024-07-04 NOTE — Assessment & Plan Note (Addendum)
 Continue with daily flonase  nasal spray, levocetirizine

## 2024-07-04 NOTE — Assessment & Plan Note (Addendum)
Continue with miralax

## 2024-07-04 NOTE — Progress Notes (Signed)
 [facility] Routine Visit Progress Note    Location:  Other [place].  Nursing Home Room Number: [facility] [id number] Place of Service:  SNF (31)   PCP: Laurence Locus, DO   Patient Care Team: Laurence Locus, DO as PCP - General (Internal Medicine)   Extended Emergency Contact Information Primary Emergency Contact: Dibens,Marcia  [place] Home Phone: [phone] Relation: Daughter   Goals of care: Advanced Directive information    06/05/2024   10:34 AM  Advanced Directives  Does Patient Have a Medical Advance Directive? Yes  Type of Advance Directive Out of facility DNR (pink MOST or yellow form)  Does patient want to make changes to medical advance directive? No - Patient declined    CODE STATUS: Do Not Resuscitate (DNR)   Chief Complaint  Patient presents with   Medical Management of Chronic Issues    Medical Management of Chronic Issues.      HPI: Pt is a 89 y.o. female seen today for medical management of chronic disease.   [name] [name] is a 89 year old female with a history of chronic diastolic heart failure, osteoporosis, reflux, insomnia, chronic constipation, mild dementia who is seen for routine medical care.  She has been living at [facility] since January 2023.  She was a former assisted living resident at [facility] back in October 2016.  Patient denies any health concerns at this point.  She is feeling fine.  Past Medical History:  Diagnosis Date   Allergic rhinitis due to pollen    some shellfish also   Aortic sclerosis 12/2010   On echo. no stenosis   Arthritis    Diastolic dysfunction    stress echo otherwise normal 1/04. Repeat 5/11 also negative   History of seasonal allergies    Hx of basal cell carcinoma 1990   multiple sites   Hx of squamous cell carcinoma of skin 2000   R nasolabial fold   Hyperlipidemia    Ingrown toenail 08/01/2022   Osteoarthritis, multiple sites    Osteoporosis    Overactive bladder    Urge incontinence     Vaginal odor 04/2018   aerobic vaginitis on One Swab culture   Past Surgical History:  Procedure Laterality Date   CYSTOCELE REPAIR  2007   and rectocele   DILATION AND CURETTAGE OF UTERUS     INGUINAL HERNIA REPAIR  2012   left side with mesh   REVERSE SHOULDER ARTHROPLASTY Right 04/27/2017   Procedure: REVERSE SHOULDER ARTHROPLASTY;  Surgeon: Edie Norleen PARAS, MD;  Location: [facility] ORS;  Service: Orthopedics;  Laterality: Right;   TONSILLECTOMY AND ADENOIDECTOMY     as child   TOTAL HIP ARTHROPLASTY Right 05/23/2015   Procedure: TOTAL HIP ARTHROPLASTY ANTERIOR APPROACH;  Surgeon: Ozell Flake, MD;  Location: [facility] ORS;  Service: Orthopedics;  Laterality: Right;   UMBILICAL HERNIA REPAIR  02/04/06     Allergies  Allergen Reactions   Cat Dander    Demerol [Meperidine]     dizzy   Dust Mite Extract    Shellfish Allergy    Avelox  [Moxifloxacin  Hcl In Nacl] Rash     Outpatient Encounter Medications as of 07/04/2024  Medication Sig   acetaminophen  (TYLENOL ) 500 MG tablet Take 500 mg by mouth 3 (three) times daily. Scheduled and Give one tablet by mouth every 8 hours as needed for pain.   aspirin  EC 81 MG tablet Take 81 mg by mouth daily. Swallow whole.   brimonidine (ALPHAGAN) 0.2 % ophthalmic solution  Place 1 drop into both eyes 2 (two) times daily.   diclofenac Sodium (VOLTAREN) 1 % GEL Apply 4 g topically 3 (three) times daily. To left knee see other listing for shoulder use   diclofenac Sodium (VOLTAREN) 1 % GEL Apply 2 g topically every 8 (eight) hours as needed (to right shoulder).   dorzolamide -timolol  (COSOPT ) 22.3-6.8 MG/ML ophthalmic solution Apply 1 drop to eye 2 (two) times daily.   EPIPEN  2-PAK 0.3 MG/0.3ML SOAJ injection Inject into the muscle as needed for anaphylaxis (or allergic reaction).   fluticasone  (FLONASE ) 50 MCG/ACT nasal spray Place 1 spray into both nostrils 2 (two) times a day.   latanoprost  (XALATAN ) 0.005 % ophthalmic solution Place 1 drop into both eyes at  bedtime.   levocetirizine (XYZAL ) 5 MG tablet Take 5 mg by mouth every evening.   melatonin 3 MG TABS tablet Take 3 mg by mouth at bedtime.   mirtazapine (REMERON) 15 MG tablet Take 15 mg by mouth at bedtime.   Multiple Vitamin (MULTIVITAMIN) tablet Take 1 tablet by mouth daily.   Nutritional Supplements (ENSURE CLEAR) LIQD Take 1 Container by mouth 2 (two) times daily.   polyethylene glycol (MIRALAX  / GLYCOLAX ) 17 g packet Take 17 g by mouth daily.   Probiotic Product (PROBIOTIC ADVANCED PO) Take 1 capsule by mouth daily.   Simethicone  (GAS-X PO) Take by mouth 2 (two) times daily.   traMADol  (ULTRAM ) 50 MG tablet Take 1 tablet (50 mg total) by mouth every 8 (eight) hours as needed. Give one tablet by mouth at bedtime   traMADol  (ULTRAM ) 50 MG tablet Take 50 mg by mouth at bedtime. See as needed dosing also   No facility-administered encounter medications on file as of 07/04/2024.     Review of Systems  Constitutional: Negative.   HENT: Negative.    Eyes:        Occasional red eyes.  No pain.  No change in vision.  Respiratory: Negative.    Cardiovascular: Negative.   Gastrointestinal: Negative.   Endocrine: Negative.   Genitourinary: Negative.   Allergic/Immunologic: Negative.   Neurological: Negative.   Hematological: Negative.   Psychiatric/Behavioral: Negative.    All other systems reviewed and are negative.     Immunization History  Administered Date(s) Administered   INFLUENZA, HIGH DOSE SEASONAL PF 04/28/2017, 05/16/2018, 05/19/2022   Influenza, Seasonal, Injecte, Preservative Fre 05/28/2016   Influenza,inj,Quad PF,6+ Mos 05/25/2013, 05/07/2015   Influenza-Unspecified 04/24/2014, 05/20/2021, 05/26/2023, 05/16/2024   Moderna Covid-19 Fall Seasonal Vaccine 57yrs & older 11/10/2022   Moderna SARS-COV2 Booster Vaccination 06/14/2020, 12/20/2020, 12/30/2021   Moderna Sars-Covid-2 Vaccination 08/14/2019, 09/11/2019, 06/14/2020, 04/25/2021, 06/12/2022   PFIZER(Purple  Top)SARS-COV-2 Vaccination 12/05/2019, 12/05/2020   Pfizer Covid-19 Vaccine Bivalent Booster 59yrs & up 04/25/2021   Pneumococcal Conjugate-13 02/07/2014   Pneumococcal Polysaccharide-23 02/15/2012, 05/29/2015, 12/10/2015, 12/08/2016, 12/07/2017, 03/07/2018, 12/05/2019, 12/16/2021, 12/17/2022   Td 09/12/2002, 02/06/2013, 04/10/2024   Unspecified SARS-COV-2 Vaccination 04/30/2023, 11/26/2023   Zoster, Live 02/19/2016   Pertinent  Health Maintenance Due  Topic Date Due   Influenza Vaccine  Completed   Bone Density Scan  Completed      08/13/2021    6:31 AM 12/31/2022    2:58 PM 02/05/2023    1:15 PM 01/13/2024    2:16 PM 06/05/2024    3:50 PM  Fall Risk  Falls in the past year?  1 1 1 1   Was there an injury with Fall?   0  1  1   Fall Risk Category Calculator  2 3 2   (RETIRED) Patient Fall Risk Level Moderate fall risk       Patient at Risk for Falls Due to  History of fall(s);Impaired balance/gait History of fall(s);Impaired balance/gait History of fall(s);Impaired balance/gait History of fall(s);Impaired balance/gait;Impaired mobility  Fall risk Follow up    Falls evaluation completed Falls evaluation completed     Data saved with a previous flowsheet row definition   Functional Status Survey:     Vitals:   07/04/24 1646  BP: (!) 143/74  Pulse: 84  Resp: 16  Temp: 97.7 F (36.5 C)  SpO2: 98%  Weight: 141 lb 6.4 oz (64.1 kg)  Height: 5' (1.524 m)   Body mass index is 27.62 kg/m. Physical Exam Vitals and nursing note reviewed.  Constitutional:      General: She is not in acute distress.    Appearance: She is not toxic-appearing or diaphoretic.  HENT:     Head: Normocephalic and atraumatic.     Nose: Nose normal.  Eyes:     Comments: Mildly erythematous left upper eyelid.  No drainage.  No edema.  Cardiovascular:     Rate and Rhythm: Normal rate and regular rhythm.     Heart sounds: Murmur heard.     Comments: 2 out of 6 to 3 out of 6 systolic ejection murmur left  upper sternal border.  Somewhat harsh sounding.  Sounds like aortic stenosis murmur. Pulmonary:     Effort: Pulmonary effort is normal. No respiratory distress.  Abdominal:     General: Bowel sounds are normal. There is no distension.     Palpations: Abdomen is soft.     Tenderness: There is no abdominal tenderness.  Musculoskeletal:     Comments: Patient wearing shin protectors due to her prior history of skin tears.  On both lower legs.  Skin:    General: Skin is warm and dry.     Capillary Refill: Capillary refill takes less than 2 seconds.  Neurological:     Mental Status: She is alert and oriented to person, place, and time.      Labs reviewed: Recent Labs    11/11/23 0000 12/03/23 0000 06/05/24 0000  NA 138 137 138  K 4.1 3.9 4.4  CL 103 103 104  CO2 25* 25* 25*  BUN 28* 22* 22*  CREATININE 0.7 0.8 0.7  CALCIUM 8.8 8.9 8.6*   Recent Labs    08/16/23 0000 11/11/23 0000 12/03/23 0000 06/05/24 0000  AST 16 22 19 16   ALT 7 11 10 9   ALKPHOS 94 90  --  96  ALBUMIN 3.6 3.6 3.6 3.8   Recent Labs    08/16/23 0000 11/11/23 0000 12/03/23 0000 06/05/24 0000  WBC 7.8 12.1 9.5 9.0  NEUTROABS 2,215.00  --  3,838.00 3,627.00  HGB 12.4 11.8* 12.6 11.3*  HCT 38 36 38 35*  PLT 211 236 288 322   Lab Results  Component Value Date   TSH 1.27 02/06/2013   No results found for: HGBA1C Lab Results  Component Value Date   CHOL 232 (H) 02/06/2013   HDL 77.50 02/06/2013   LDLDIRECT 138.4 02/06/2013   TRIG 72.0 02/06/2013   CHOLHDL 3 02/06/2013     Assessment & Plan Chronic diastolic heart failure (HCC) Stable. Not on any daily or prn diuretics.     Cardiac murmur Last known echo was from 12-03-2010. Pt does have a harsh LUSB murmur that sounds like an aortic stenosis murmur. But at age 4 yo she  is not a candidate for surgical AVR or TAVR.     Other glaucoma of both eyes Continue with alphagan, cosopt  and xalatan  eye drops     Seasonal allergic rhinitis  due to pollen Continue with daily flonase  nasal spray, levocetirizine     Chronic constipation Continue with miralax      Primary osteoarthritis involving multiple joints On scheduled tylenol  500 mg tid, prn ultram  50 mg and topical Voltaren gel tid.     Weight loss Weight in 07-2021 was 158 lbs, in 07-2022 was 145.6 lbs, in 07-2023 was 143.2 lbs, in November 2025 was 140.2 lbs.  Pt started on Remeron 15 mg at bedtime for appetite stimulation.     Primary insomnia Continue with melatonin 3 mg     DNR (do not resuscitate) Verified the patient is a DO NOT RESUSCITATE.        Orders Placed This Encounter  Procedures   CBC and differential    This external order was created through the Results Console.   CBC    This external order was created through the Results Console.   Basic metabolic panel with GFR    This external order was created through the Results Console.   Comprehensive metabolic panel with GFR    This external order was created through the Results Console.   Hepatic function panel    This external order was created through the Results Console.    Camellia Door, DO  [facility] [phone]

## 2024-08-04 ENCOUNTER — Encounter: Payer: Self-pay | Admitting: Orthopedic Surgery

## 2024-08-04 NOTE — Progress Notes (Signed)
 error    This encounter was created in error - please disregard.

## 2024-08-09 ENCOUNTER — Encounter: Payer: Self-pay | Admitting: Orthopedic Surgery

## 2024-08-09 ENCOUNTER — Non-Acute Institutional Stay (SKILLED_NURSING_FACILITY): Payer: Self-pay | Admitting: Orthopedic Surgery

## 2024-08-09 DIAGNOSIS — I700 Atherosclerosis of aorta: Secondary | ICD-10-CM

## 2024-08-09 DIAGNOSIS — E441 Mild protein-calorie malnutrition: Secondary | ICD-10-CM

## 2024-08-09 DIAGNOSIS — F5101 Primary insomnia: Secondary | ICD-10-CM

## 2024-08-09 DIAGNOSIS — G8929 Other chronic pain: Secondary | ICD-10-CM

## 2024-08-09 DIAGNOSIS — M545 Low back pain, unspecified: Secondary | ICD-10-CM

## 2024-08-09 DIAGNOSIS — S81811D Laceration without foreign body, right lower leg, subsequent encounter: Secondary | ICD-10-CM

## 2024-08-09 DIAGNOSIS — F03B0 Unspecified dementia, moderate, without behavioral disturbance, psychotic disturbance, mood disturbance, and anxiety: Secondary | ICD-10-CM

## 2024-08-09 DIAGNOSIS — I5032 Chronic diastolic (congestive) heart failure: Secondary | ICD-10-CM

## 2024-08-09 NOTE — Progress Notes (Signed)
 " Location:  [place].  Nursing Home Room Number: [facility] Place of Service:  SNF [phone]) Provider:  Greig Cluster, NP  PCP: Laurence Locus, DO  Patient Care Team: Laurence Locus, DO as PCP - General (Internal Medicine)  Extended Emergency Contact Information Primary Emergency Contact: Dibens,Marcia  [place] Home Phone: [phone] Relation: Daughter  Code Status:  DNR Goals of care: Advanced Directive information    08/09/2024    9:45 AM  Advanced Directives  Does Patient Have a Medical Advance Directive? Yes  Type of Advance Directive Out of facility DNR (pink MOST or yellow form)  Does patient want to make changes to medical advance directive? No - Patient declined     Chief Complaint  Patient presents with   Medical Management of Chronic Issues    Medical Management of Chronic Issues.     HPI:  Pt is a 89 y.o. female seen today for medical management of chronic diseases.    She currently resides on the skilled nursing unit at [facility]. PMH: CHF, HLD, PVD, atherosclerosis, GERD, constipation, dementia, OA, RATH 2016, osteoporosis, incontinence, insomnia, squamous cell carcinoma 2000, basal cell carcinoma 1990.   Weight loss/malnutrition - BMI 27.50, lost about 20 lbs within past year, remains on Remeron   CHF- see weights below, not on diuretics, wears compression hose daily  Dementia- UTA BIMS, no behaviors, dependent with ADLs except feeding, wander guard on w/c, ambulates with wheelchair Atherosclerosis of Aorta- remains on asa RLE wound- onset 04/10/2024, followed by wound care physician, completed doxycycline, discharged from wound care 11/19, continues to wear shin bumpers  Chronic back pain- initially on morphine > weaned to Tramadol   Insomnia- remains on melatonin  02/16 oral surgery> plan to grind tooth down and place denture  Recent weights:  01/07- 141.2 lbs  12/01- 141.4 lbs  11/05- 140.2 lbs   Past Medical History:  Diagnosis  Date   Allergic rhinitis due to pollen    some shellfish also   Aortic sclerosis 12/2010   On echo. no stenosis   Arthritis    Diastolic dysfunction    stress echo otherwise normal 1/04. Repeat 5/11 also negative   History of seasonal allergies    Hx of basal cell carcinoma 1990   multiple sites   Hx of squamous cell carcinoma of skin 2000   R nasolabial fold   Hyperlipidemia    Ingrown toenail 08/01/2022   Osteoarthritis, multiple sites    Osteoporosis    Overactive bladder    Urge incontinence    Vaginal odor 04/2018   aerobic vaginitis on One Swab culture   Past Surgical History:  Procedure Laterality Date   CYSTOCELE REPAIR  2007   and rectocele   DILATION AND CURETTAGE OF UTERUS     INGUINAL HERNIA REPAIR  2012   left side with mesh   REVERSE SHOULDER ARTHROPLASTY Right 04/27/2017   Procedure: REVERSE SHOULDER ARTHROPLASTY;  Surgeon: Edie Norleen PARAS, MD;  Location: [facility] ORS;  Service: Orthopedics;  Laterality: Right;   TONSILLECTOMY AND ADENOIDECTOMY     as child   TOTAL HIP ARTHROPLASTY Right 05/23/2015   Procedure: TOTAL HIP ARTHROPLASTY ANTERIOR APPROACH;  Surgeon: Ozell Flake, MD;  Location: [facility] ORS;  Service: Orthopedics;  Laterality: Right;   UMBILICAL HERNIA REPAIR  02/04/06    Allergies[1]  Outpatient Encounter Medications as of 08/09/2024  Medication Sig   acetaminophen  (TYLENOL ) 500 MG tablet Take 500 mg by mouth 3 (three) times daily. Scheduled and  Give one tablet by mouth every 8 hours as needed for pain.   aspirin  EC 81 MG tablet Take 81 mg by mouth daily. Swallow whole.   brimonidine (ALPHAGAN) 0.2 % ophthalmic solution Place 1 drop into both eyes 2 (two) times daily.   diclofenac Sodium (VOLTAREN) 1 % GEL Apply 4 g topically 3 (three) times daily. To left knee see other listing for shoulder use   diclofenac Sodium (VOLTAREN) 1 % GEL Apply 2 g topically every 8 (eight) hours as needed (to right shoulder).   dorzolamide -timolol  (COSOPT ) 22.3-6.8 MG/ML  ophthalmic solution Apply 1 drop to eye 2 (two) times daily.   EPIPEN  2-PAK 0.3 MG/0.3ML SOAJ injection Inject into the muscle as needed for anaphylaxis (or allergic reaction).   fluticasone  (FLONASE ) 50 MCG/ACT nasal spray Place 1 spray into both nostrils 2 (two) times a day.   latanoprost  (XALATAN ) 0.005 % ophthalmic solution Place 1 drop into both eyes at bedtime.   levocetirizine (XYZAL ) 5 MG tablet Take 5 mg by mouth every evening.   melatonin 3 MG TABS tablet Take 3 mg by mouth at bedtime.   mirtazapine (REMERON) 15 MG tablet Take 15 mg by mouth at bedtime.   Multiple Vitamin (MULTIVITAMIN) tablet Take 1 tablet by mouth daily.   Nutritional Supplements (ENSURE CLEAR) LIQD Take 1 Container by mouth 2 (two) times daily.   polyethylene glycol (MIRALAX  / GLYCOLAX ) 17 g packet Take 17 g by mouth daily.   Probiotic Product (PROBIOTIC ADVANCED PO) Take 1 capsule by mouth daily.   Simethicone  (GAS-X PO) Take by mouth 2 (two) times daily.   traMADol  (ULTRAM ) 50 MG tablet Take 1 tablet (50 mg total) by mouth every 8 (eight) hours as needed. Give one tablet by mouth at bedtime   traMADol  (ULTRAM ) 50 MG tablet Take 50 mg by mouth at bedtime. See as needed dosing also   No facility-administered encounter medications on file as of 08/09/2024.    Review of Systems  Unable to perform ROS: Dementia    Immunization History  Administered Date(s) Administered   INFLUENZA, HIGH DOSE SEASONAL PF 04/28/2017, 05/16/2018, 05/19/2022   Influenza, Seasonal, Injecte, Preservative Fre 05/28/2016   Influenza,inj,Quad PF,6+ Mos 05/25/2013, 05/07/2015   Influenza-Unspecified 04/24/2014, 05/20/2021, 05/26/2023, 05/16/2024   Moderna Covid-19 Fall Seasonal Vaccine 75yrs & older 11/10/2022   Moderna SARS-COV2 Booster Vaccination 06/14/2020, 12/20/2020, 12/30/2021   Moderna Sars-Covid-2 Vaccination 08/14/2019, 09/11/2019, 06/14/2020, 04/25/2021, 06/12/2022   PFIZER(Purple Top)SARS-COV-2 Vaccination 12/05/2019,  12/05/2020   Pfizer Covid-19 Vaccine Bivalent Booster 66yrs & up 04/25/2021   Pneumococcal Conjugate-13 02/07/2014   Pneumococcal Polysaccharide-23 02/15/2012, 05/29/2015, 12/10/2015, 12/08/2016, 12/07/2017, 03/07/2018, 12/05/2019, 12/16/2021, 12/17/2022   Td 09/12/2002, 02/06/2013, 04/10/2024   Unspecified SARS-COV-2 Vaccination 04/30/2023, 11/26/2023, 05/26/2024   Zoster, Live 02/19/2016   Pertinent  Health Maintenance Due  Topic Date Due   Influenza Vaccine  Completed   Bone Density Scan  Completed      08/13/2021    6:31 AM 12/31/2022    2:58 PM 02/05/2023    1:15 PM 01/13/2024    2:16 PM 06/05/2024    3:50 PM  Fall Risk  Falls in the past year?  1 1 1 1   Was there an injury with Fall?   0  1  1   Fall Risk Category Calculator   2 3 2   (RETIRED) Patient Fall Risk Level Moderate fall risk       Patient at Risk for Falls Due to  History of fall(s);Impaired balance/gait History of fall(s);Impaired balance/gait  History of fall(s);Impaired balance/gait History of fall(s);Impaired balance/gait;Impaired mobility  Fall risk Follow up    Falls evaluation completed Falls evaluation completed     Data saved with a previous flowsheet row definition   Functional Status Survey:    Vitals:   08/09/24 0941  BP: (!) 173/84  Pulse: 90  Resp: 18  Temp: 98.1 F (36.7 C)  SpO2: 92%  Weight: 141 lb 1.6 oz (64 kg)  Height: 5' (1.524 m)   Body mass index is 27.56 kg/m. Physical Exam Vitals reviewed.  Constitutional:      General: She is not in acute distress. HENT:     Head: Normocephalic.     Right Ear: There is no impacted cerumen.     Left Ear: There is no impacted cerumen.     Ears:     Comments: Bilateral hearing aids    Nose: Nose normal.     Mouth/Throat:     Mouth: Mucous membranes are moist.  Eyes:     General:        Right eye: No discharge.        Left eye: No discharge.  Cardiovascular:     Rate and Rhythm: Normal rate and regular rhythm.     Pulses: Normal pulses.      Heart sounds: Murmur heard.  Pulmonary:     Effort: Pulmonary effort is normal.     Breath sounds: Normal breath sounds.  Abdominal:     General: Bowel sounds are normal. There is no distension.     Palpations: Abdomen is soft.     Tenderness: There is no abdominal tenderness.  Musculoskeletal:     Cervical back: Neck supple.     Right lower leg: No edema.     Left lower leg: No edema.  Skin:    General: Skin is warm and dry.     Capillary Refill: Capillary refill takes less than 2 seconds.     Comments: Dry, fragile skin throughout body, RLE healed, shin bumpers on   Neurological:     General: No focal deficit present.     Mental Status: She is alert. Mental status is at baseline.     Gait: Gait abnormal.  Psychiatric:        Mood and Affect: Mood normal.     Labs reviewed: Recent Labs    11/11/23 0000 12/03/23 0000 06/05/24 0000  NA 138 137 138  K 4.1 3.9 4.4  CL 103 103 104  CO2 25* 25* 25*  BUN 28* 22* 22*  CREATININE 0.7 0.8 0.7  CALCIUM 8.8 8.9 8.6*   Recent Labs    08/16/23 0000 11/11/23 0000 12/03/23 0000 06/05/24 0000  AST 16 22 19 16   ALT 7 11 10 9   ALKPHOS 94 90  --  96  ALBUMIN 3.6 3.6 3.6 3.8   Recent Labs    08/16/23 0000 11/11/23 0000 12/03/23 0000 06/05/24 0000  WBC 7.8 12.1 9.5 9.0  NEUTROABS 2,215.00  --  3,838.00 3,627.00  HGB 12.4 11.8* 12.6 11.3*  HCT 38 36 38 35*  PLT 211 236 288 322   Lab Results  Component Value Date   TSH 1.27 02/06/2013   No results found for: HGBA1C Lab Results  Component Value Date   CHOL 232 (H) 02/06/2013   HDL 77.50 02/06/2013   LDLDIRECT 138.4 02/06/2013   TRIG 72.0 02/06/2013   CHOLHDL 3 02/06/2013    Significant Diagnostic Results in last 30 days:  No results found.  Assessment/Plan 1. Mild protein-calorie malnutrition (Primary) - BMI 27.56 - lost 20 lbs within 1 year - albumin 3.8  - cont monthly weights  2. Chronic diastolic heart failure (HCC) - compensated - not on  diuretics - cont compression stockings  3. Moderate dementia without behavioral disturbance, psychotic disturbance, mood disturbance, or anxiety, unspecified dementia type (HCC) - no behaviors - no recent BIMS - weights stable - ambulates with w/c - dependent with ADLs> needs cueing  - cont skilled nursing   4. Atherosclerosis of aorta - cont asa  5. Skin tear of right lower leg without complication, subsequent encounter - 11/19 discharged from wound  - cont shin bumpers  6. Chronic midline low back pain without sciatica - ongoing - off morphine  - cont Tramadol    7. Primary insomnia - cont melatonin    Family/ staff Communication: plan discussed with patient and nurse  Labs/tests ordered:  none         [1]  Allergies Allergen Reactions   Cat Dander    Demerol [Meperidine]     dizzy   Dust Mite Extract    Shellfish Allergy    Avelox  [Moxifloxacin  Hcl In Nacl] Rash   "

## 2024-09-06 ENCOUNTER — Non-Acute Institutional Stay (SKILLED_NURSING_FACILITY): Payer: Self-pay | Admitting: Orthopedic Surgery

## 2024-09-06 ENCOUNTER — Encounter: Payer: Self-pay | Admitting: Orthopedic Surgery

## 2024-09-06 DIAGNOSIS — I700 Atherosclerosis of aorta: Secondary | ICD-10-CM

## 2024-09-06 DIAGNOSIS — F5101 Primary insomnia: Secondary | ICD-10-CM

## 2024-09-06 DIAGNOSIS — R634 Abnormal weight loss: Secondary | ICD-10-CM | POA: Diagnosis not present

## 2024-09-06 DIAGNOSIS — I5032 Chronic diastolic (congestive) heart failure: Secondary | ICD-10-CM | POA: Diagnosis not present

## 2024-09-06 DIAGNOSIS — R030 Elevated blood-pressure reading, without diagnosis of hypertension: Secondary | ICD-10-CM

## 2024-09-06 DIAGNOSIS — M150 Primary generalized (osteo)arthritis: Secondary | ICD-10-CM

## 2024-09-06 DIAGNOSIS — F03B0 Unspecified dementia, moderate, without behavioral disturbance, psychotic disturbance, mood disturbance, and anxiety: Secondary | ICD-10-CM

## 2024-09-06 NOTE — Progress Notes (Signed)
 " Location:  Other [place].  Nursing Home Room Number: [facility] Place of Service:  SNF [phone]) Provider:  Greig Cluster, NP  PCP: Laurence Locus, DO  Patient Care Team: Laurence Locus, DO as PCP - General (Internal Medicine)  Extended Emergency Contact Information Primary Emergency Contact: Dibens,Marcia  [place] Home Phone: [phone] Relation: Daughter  Code Status:  DNR Goals of care: Advanced Directive information    08/09/2024    9:45 AM  Advanced Directives  Does Patient Have a Medical Advance Directive? Yes  Type of Advance Directive Out of facility DNR (pink MOST or yellow form)  Does patient want to make changes to medical advance directive? No - Patient declined     Chief Complaint  Patient presents with   Medical Management of Chronic Issues    Medical Management of Chronic Issues.     HPI:  Pt is a 89 y.o. female seen today for medical management of chronic diseases.    She currently resides on the skilled nursing unit at [facility]. PMH: CHF, HLD, PVD, atherosclerosis, GERD, constipation, dementia, OA, RATH 2016, osteoporosis, incontinence, insomnia, squamous cell carcinoma 2000, basal cell carcinoma 1990.    Elevated blood pressure- see trends below Weight loss/malnutrition - BMI 27.58, 20 lbs weight loss within past year, no weight decline from last month, remains on Remeron   CHF- not on diuretics, wears compression hose daily  Dementia- UTA BIMS, no behaviors, dependent with ADLs except feeding, wander guard on w/c, ambulates with wheelchair Atherosclerosis of Aorta- remains on asa OA/back pain- remains on tylenol , Tramadol  and topical voltaren Insomnia- remains on melatonin  No concerns today. She continues to wear geri sleeves and shin bumpers due to fragile, thin skin.   Recent blood pressures:  160/74, 182/82, 161/79, 177/72   Past Medical History:  Diagnosis Date   Allergic rhinitis due to pollen    some shellfish also    Aortic sclerosis 12/2010   On echo. no stenosis   Arthritis    Diastolic dysfunction    stress echo otherwise normal 1/04. Repeat 5/11 also negative   History of seasonal allergies    Hx of basal cell carcinoma 1990   multiple sites   Hx of squamous cell carcinoma of skin 2000   R nasolabial fold   Hyperlipidemia    Ingrown toenail 08/01/2022   Osteoarthritis, multiple sites    Osteoporosis    Overactive bladder    Urge incontinence    Vaginal odor 04/2018   aerobic vaginitis on One Swab culture   Past Surgical History:  Procedure Laterality Date   CYSTOCELE REPAIR  2007   and rectocele   DILATION AND CURETTAGE OF UTERUS     INGUINAL HERNIA REPAIR  2012   left side with mesh   REVERSE SHOULDER ARTHROPLASTY Right 04/27/2017   Procedure: REVERSE SHOULDER ARTHROPLASTY;  Surgeon: Edie Norleen PARAS, MD;  Location: [facility] ORS;  Service: Orthopedics;  Laterality: Right;   TONSILLECTOMY AND ADENOIDECTOMY     as child   TOTAL HIP ARTHROPLASTY Right 05/23/2015   Procedure: TOTAL HIP ARTHROPLASTY ANTERIOR APPROACH;  Surgeon: Ozell Flake, MD;  Location: [facility] ORS;  Service: Orthopedics;  Laterality: Right;   UMBILICAL HERNIA REPAIR  02/04/06    Allergies[1]  Outpatient Encounter Medications as of 09/06/2024  Medication Sig   acetaminophen  (TYLENOL ) 500 MG tablet Take 500 mg by mouth 3 (three) times daily. Scheduled and Give one tablet by mouth every 8 hours as needed for pain.  aspirin  EC 81 MG tablet Take 81 mg by mouth daily. Swallow whole.   brimonidine (ALPHAGAN) 0.2 % ophthalmic solution Place 1 drop into both eyes 2 (two) times daily.   diclofenac Sodium (VOLTAREN) 1 % GEL Apply 4 g topically 3 (three) times daily. To left knee see other listing for shoulder use   diclofenac Sodium (VOLTAREN) 1 % GEL Apply 2 g topically every 8 (eight) hours as needed (to right shoulder).   dorzolamide -timolol  (COSOPT ) 22.3-6.8 MG/ML ophthalmic solution Apply 1 drop to eye 2 (two) times daily.   EPIPEN   2-PAK 0.3 MG/0.3ML SOAJ injection Inject into the muscle as needed for anaphylaxis (or allergic reaction).   fluticasone  (FLONASE ) 50 MCG/ACT nasal spray Place 1 spray into both nostrils 2 (two) times a day.   latanoprost  (XALATAN ) 0.005 % ophthalmic solution Place 1 drop into both eyes at bedtime.   levocetirizine (XYZAL ) 5 MG tablet Take 5 mg by mouth every evening.   melatonin 3 MG TABS tablet Take 3 mg by mouth at bedtime.   mirtazapine (REMERON) 15 MG tablet Take 15 mg by mouth at bedtime.   Multiple Vitamin (MULTIVITAMIN) tablet Take 1 tablet by mouth daily.   Nutritional Supplements (ENSURE CLEAR) LIQD Take 1 Container by mouth 2 (two) times daily.   polyethylene glycol (MIRALAX  / GLYCOLAX ) 17 g packet Take 17 g by mouth daily.   Probiotic Product (PROBIOTIC ADVANCED PO) Take 1 capsule by mouth daily.   Simethicone  (GAS-X PO) Take by mouth 2 (two) times daily.   traMADol  (ULTRAM ) 50 MG tablet Take 1 tablet (50 mg total) by mouth every 8 (eight) hours as needed. Give one tablet by mouth at bedtime   traMADol  (ULTRAM ) 50 MG tablet Take 50 mg by mouth at bedtime. See as needed dosing also   No facility-administered encounter medications on file as of 09/06/2024.    Review of Systems  Unable to perform ROS: Dementia    Immunization History  Administered Date(s) Administered   INFLUENZA, HIGH DOSE SEASONAL PF 04/28/2017, 05/16/2018, 05/19/2022   Influenza, Seasonal, Injecte, Preservative Fre 05/28/2016   Influenza,inj,Quad PF,6+ Mos 05/25/2013, 05/07/2015   Influenza-Unspecified 04/24/2014, 05/20/2021, 05/26/2023, 05/16/2024   Moderna Covid-19 Fall Seasonal Vaccine 32yrs & older 11/10/2022   Moderna SARS-COV2 Booster Vaccination 06/14/2020, 12/20/2020, 12/30/2021   Moderna Sars-Covid-2 Vaccination 08/14/2019, 09/11/2019, 06/14/2020, 04/25/2021, 06/12/2022   PFIZER(Purple Top)SARS-COV-2 Vaccination 12/05/2019, 12/05/2020   Pfizer Covid-19 Vaccine Bivalent Booster 50yrs & up 04/25/2021    Pneumococcal Conjugate-13 02/07/2014   Pneumococcal Polysaccharide-23 02/15/2012, 05/29/2015, 12/10/2015, 12/08/2016, 12/07/2017, 03/07/2018, 12/05/2019, 12/16/2021, 12/17/2022   Td 09/12/2002, 02/06/2013, 04/10/2024   Unspecified SARS-COV-2 Vaccination 04/30/2023, 11/26/2023, 05/26/2024   Zoster, Live 02/19/2016   Pertinent  Health Maintenance Due  Topic Date Due   Influenza Vaccine  Completed   Bone Density Scan  Completed      08/13/2021    6:31 AM 12/31/2022    2:58 PM 02/05/2023    1:15 PM 01/13/2024    2:16 PM 06/05/2024    3:50 PM  Fall Risk  Falls in the past year?  1 1 1 1   Was there an injury with Fall?   0  1  1   Fall Risk Category Calculator   2 3 2   (RETIRED) Patient Fall Risk Level Moderate fall risk       Patient at Risk for Falls Due to  History of fall(s);Impaired balance/gait History of fall(s);Impaired balance/gait History of fall(s);Impaired balance/gait History of fall(s);Impaired balance/gait;Impaired mobility  Fall risk Follow up  Falls evaluation completed Falls evaluation completed     Data saved with a previous flowsheet row definition   Functional Status Survey:    Vitals:   09/06/24 0924 09/06/24 0933  BP: (!) 160/74 (!) 161/79  Pulse: 87   Resp: 18   Temp: 97.8 F (36.6 C)   SpO2: 92%   Weight: 141 lb 3.2 oz (64 kg)   Height: 5' (1.524 m)    Body mass index is 27.58 kg/m. Physical Exam Vitals reviewed.  Constitutional:      General: She is not in acute distress.    Appearance: She is not ill-appearing.  HENT:     Head: Normocephalic.     Ears:     Comments: HOH    Nose: Nose normal.     Mouth/Throat:     Mouth: Mucous membranes are moist.  Eyes:     General:        Right eye: No discharge.        Left eye: No discharge.  Cardiovascular:     Rate and Rhythm: Regular rhythm.     Pulses: Normal pulses.     Heart sounds: Murmur heard.  Pulmonary:     Effort: Pulmonary effort is normal. No respiratory distress.     Breath  sounds: Normal breath sounds. No wheezing or rales.  Abdominal:     General: Bowel sounds are normal. There is no distension.     Palpations: Abdomen is soft.     Tenderness: There is no abdominal tenderness.  Musculoskeletal:     Cervical back: Neck supple.     Right lower leg: No edema.     Left lower leg: No edema.  Skin:    General: Skin is warm.     Capillary Refill: Capillary refill takes less than 2 seconds.     Comments: Over all skin thin and fragile  Neurological:     General: No focal deficit present.     Mental Status: She is alert. Mental status is at baseline.     Gait: Gait abnormal.  Psychiatric:        Mood and Affect: Mood normal.     Comments: Very pleasant, alert to self/familiar face, follows commands     Labs reviewed: Recent Labs    11/11/23 0000 12/03/23 0000 06/05/24 0000  NA 138 137 138  K 4.1 3.9 4.4  CL 103 103 104  CO2 25* 25* 25*  BUN 28* 22* 22*  CREATININE 0.7 0.8 0.7  CALCIUM 8.8 8.9 8.6*   Recent Labs    11/11/23 0000 12/03/23 0000 06/05/24 0000  AST 22 19 16   ALT 11 10 9   ALKPHOS 90  --  96  ALBUMIN 3.6 3.6 3.8   Recent Labs    11/11/23 0000 12/03/23 0000 06/05/24 0000  WBC 12.1 9.5 9.0  NEUTROABS  --  3,838.00 3,627.00  HGB 11.8* 12.6 11.3*  HCT 36 38 35*  PLT 236 288 322   Lab Results  Component Value Date   TSH 1.27 02/06/2013   No results found for: HGBA1C Lab Results  Component Value Date   CHOL 232 (H) 02/06/2013   HDL 77.50 02/06/2013   LDLDIRECT 138.4 02/06/2013   TRIG 72.0 02/06/2013   CHOLHDL 3 02/06/2013    Significant Diagnostic Results in last 30 days:  No results found.  Assessment/Plan 1. Elevated blood pressure reading (Primary) - uncontrolled, goal < 150/90 - start manual blood pressures BID x 7 days> leave in provider  book  - do not recommend starting antihypertensive medication - consider starting hydralazine  prn if SBP > 180  2. Weight loss - no weight loss within past month -  cont mirtazapine   3. Chronic diastolic heart failure (HCC) - compensated - not on medication  4. Moderate dementia without behavioral disturbance, psychotic disturbance, mood disturbance, or anxiety, unspecified dementia type (HCC) - no agitation  - dependent with ADLs> needs cueing  - ambulates with wheelchair - weight stable - not on medication   5. Atherosclerosis of aorta - cont asa  6. Primary osteoarthritis involving multiple joints - cont tylenol , tramadol  and Voltaren  7. Primary insomnia - stable with melatonin    Family/ staff Communication: plan discussed with patient and nurse  Labs/tests ordered:  manual blood pressures BID x 7 days        [1]  Allergies Allergen Reactions   Cat Dander    Demerol [Meperidine]     dizzy   Dust Mite Extract    Shellfish Allergy    Avelox  [Moxifloxacin  Hcl In Nacl] Rash   "
